# Patient Record
Sex: Female | Born: 1960
Health system: Southern US, Community
[De-identification: ages and names within clinical notes are randomized; demographics above are authoritative.]

## PROBLEM LIST (undated history)

## (undated) DIAGNOSIS — F32A Depression, unspecified: Secondary | ICD-10-CM

## (undated) DIAGNOSIS — J385 Laryngeal spasm: Secondary | ICD-10-CM

## (undated) DIAGNOSIS — E119 Type 2 diabetes mellitus without complications: Secondary | ICD-10-CM

## (undated) DIAGNOSIS — J383 Other diseases of vocal cords: Secondary | ICD-10-CM

## (undated) DIAGNOSIS — N1831 Chronic kidney disease, stage 3a: Secondary | ICD-10-CM

## (undated) DIAGNOSIS — N200 Calculus of kidney: Secondary | ICD-10-CM

## (undated) DIAGNOSIS — E042 Nontoxic multinodular goiter: Secondary | ICD-10-CM

## (undated) DIAGNOSIS — R42 Dizziness and giddiness: Secondary | ICD-10-CM

## (undated) DIAGNOSIS — I10 Essential (primary) hypertension: Secondary | ICD-10-CM

## (undated) DIAGNOSIS — J45909 Unspecified asthma, uncomplicated: Secondary | ICD-10-CM

## (undated) DIAGNOSIS — F419 Anxiety disorder, unspecified: Secondary | ICD-10-CM

## (undated) DIAGNOSIS — G4730 Sleep apnea, unspecified: Secondary | ICD-10-CM

## (undated) DIAGNOSIS — Z87442 Personal history of urinary calculi: Secondary | ICD-10-CM

## (undated) DIAGNOSIS — F329 Major depressive disorder, single episode, unspecified: Secondary | ICD-10-CM

## (undated) DIAGNOSIS — R490 Dysphonia: Secondary | ICD-10-CM

## (undated) HISTORY — DX: Dysphonia: R49.0

## (undated) HISTORY — DX: Calculus of kidney: N20.0

## (undated) HISTORY — DX: Laryngeal spasm: J38.5

## (undated) HISTORY — DX: Type 2 diabetes mellitus without complications: E11.9

## (undated) HISTORY — PX: LITHOTRIPSY: SUR834

## (undated) HISTORY — PX: CARDIAC CATHETERIZATION: SHX172

## (undated) HISTORY — DX: Chronic kidney disease, stage 3a: N18.31

## (undated) HISTORY — DX: Nontoxic multinodular goiter: E04.2

## (undated) HISTORY — DX: Essential (primary) hypertension: I10

## (undated) HISTORY — DX: Unspecified asthma, uncomplicated: J45.909

---
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
## 2018-01-22 ENCOUNTER — Encounter (INDEPENDENT_AMBULATORY_CARE_PROVIDER_SITE_OTHER): Payer: Self-pay

## 2018-01-22 ENCOUNTER — Ambulatory Visit (INDEPENDENT_AMBULATORY_CARE_PROVIDER_SITE_OTHER): Payer: Medicare Other | Admitting: Family Medicine

## 2018-01-22 ENCOUNTER — Ambulatory Visit (INDEPENDENT_AMBULATORY_CARE_PROVIDER_SITE_OTHER): Payer: Medicaid Other

## 2018-01-22 ENCOUNTER — Encounter: Payer: Self-pay | Admitting: Family Medicine

## 2018-01-22 VITALS — BP 126/82 | HR 95 | Temp 97.5°F | Resp 18 | Ht 68.5 in | Wt 241.4 lb

## 2018-01-22 DIAGNOSIS — N183 Chronic kidney disease, stage 3 (moderate): Secondary | ICD-10-CM | POA: Diagnosis not present

## 2018-01-22 DIAGNOSIS — E119 Type 2 diabetes mellitus without complications: Secondary | ICD-10-CM | POA: Insufficient documentation

## 2018-01-22 DIAGNOSIS — J4530 Mild persistent asthma, uncomplicated: Secondary | ICD-10-CM | POA: Diagnosis not present

## 2018-01-22 DIAGNOSIS — E1122 Type 2 diabetes mellitus with diabetic chronic kidney disease: Secondary | ICD-10-CM | POA: Diagnosis not present

## 2018-01-22 DIAGNOSIS — I10 Essential (primary) hypertension: Secondary | ICD-10-CM | POA: Insufficient documentation

## 2018-01-22 DIAGNOSIS — R0602 Shortness of breath: Secondary | ICD-10-CM | POA: Diagnosis not present

## 2018-01-22 DIAGNOSIS — E118 Type 2 diabetes mellitus with unspecified complications: Secondary | ICD-10-CM | POA: Insufficient documentation

## 2018-01-22 DIAGNOSIS — E782 Mixed hyperlipidemia: Secondary | ICD-10-CM | POA: Diagnosis not present

## 2018-01-22 LAB — COMPREHENSIVE METABOLIC PANEL
ALT: 9 U/L (ref 0–35)
AST: 11 U/L (ref 0–37)
Albumin: 4.2 g/dL (ref 3.5–5.2)
Alkaline Phosphatase: 78 U/L (ref 39–117)
BUN: 31 mg/dL — ABNORMAL HIGH (ref 6–23)
CO2: 28 mEq/L (ref 19–32)
Calcium: 9.8 mg/dL (ref 8.4–10.5)
Chloride: 101 mEq/L (ref 96–112)
Creatinine, Ser: 1.54 mg/dL — ABNORMAL HIGH (ref 0.40–1.20)
GFR: 34.57 mL/min — ABNORMAL LOW (ref >60.00)
Glucose, Bld: 184 mg/dL — ABNORMAL HIGH (ref 70–99)
Potassium: 5.2 mEq/L — ABNORMAL HIGH (ref 3.5–5.1)
Sodium: 138 mEq/L (ref 135–145)
Total Bilirubin: 0.6 mg/dL (ref 0.2–1.2)
Total Protein: 7.9 g/dL (ref 6.0–8.3)

## 2018-01-22 LAB — LIPID PANEL
Cholesterol: 185 mg/dL (ref 0–200)
HDL: 87 mg/dL (ref >39.00)
LDL Cholesterol: 80 mg/dL (ref 0–99)
NonHDL: 98.06
Total CHOL/HDL Ratio: 2
Triglycerides: 88 mg/dL (ref 0.0–149.0)
VLDL: 17.6 mg/dL (ref 0.0–40.0)

## 2018-01-22 LAB — CBC
HCT: 37.5 % (ref 36.0–46.0)
Hemoglobin: 12 g/dL (ref 12.0–15.0)
MCHC: 31.9 g/dL (ref 30.0–36.0)
MCV: 71.3 fl — ABNORMAL LOW (ref 78.0–100.0)
Platelets: 352 10*3/uL (ref 150.0–400.0)
RBC: 5.26 Mil/uL — ABNORMAL HIGH (ref 3.87–5.11)
RDW: 17.2 % — ABNORMAL HIGH (ref 11.5–15.5)
WBC: 6.5 10*3/uL (ref 4.0–10.5)

## 2018-01-22 LAB — B12 AND FOLATE PANEL
Folate: 14.5 ng/mL (ref >5.9)
Vitamin B-12: 271 pg/mL (ref 211–911)

## 2018-01-22 LAB — HEMOGLOBIN A1C: Hgb A1c MFr Bld: 7.7 % — ABNORMAL HIGH (ref 4.6–6.5)

## 2018-01-22 LAB — VITAMIN D 25 HYDROXY (VIT D DEFICIENCY, FRACTURES): VITD: 23.13 ng/mL — ABNORMAL LOW (ref 30.00–100.00)

## 2018-01-22 MED ORDER — ALBUTEROL SULFATE HFA 108 (90 BASE) MCG/ACT IN AERS: 2.0000 | INHALATION_SPRAY | Freq: Four times a day (QID) | RESPIRATORY_TRACT | 2 refills | Status: DC | PRN

## 2018-01-22 MED ORDER — SPIRONOLACTONE 50 MG PO TABS: 50.0000 mg | ORAL_TABLET | Freq: Every day | ORAL | 3 refills | Status: DC

## 2018-01-22 MED ORDER — LOVASTATIN 20 MG PO TABS: 20.0000 mg | ORAL_TABLET | Freq: Every day | ORAL | 3 refills | Status: DC

## 2018-01-22 MED ORDER — DULAGLUTIDE 1.5 MG/0.5ML ~~LOC~~ SOAJ: 1.5000 mg | SUBCUTANEOUS | 5 refills | Status: DC

## 2018-01-22 MED ORDER — FLUTICASONE FUROATE-VILANTEROL 200-25 MCG/INH IN AEPB: 1.0000 | INHALATION_SPRAY | Freq: Every day | RESPIRATORY_TRACT | 2 refills | Status: DC

## 2018-01-22 MED ORDER — AMLODIPINE BESYLATE 10 MG PO TABS: 10.0000 mg | ORAL_TABLET | Freq: Every day | ORAL | 3 refills | Status: DC

## 2018-01-22 MED ORDER — CLONIDINE HCL 0.1 MG PO TABS: 0.1000 mg | ORAL_TABLET | Freq: Two times a day (BID) | ORAL | 3 refills | Status: DC

## 2018-01-22 MED ORDER — CARVEDILOL 25 MG PO TABS: 25.0000 mg | ORAL_TABLET | Freq: Two times a day (BID) | ORAL | 3 refills | Status: DC

## 2018-01-22 NOTE — Progress Notes (Signed)
Subjective:    Patient ID: Nancy Barajas, female    DOB: April 02, 1960, 58 y.o.   MRN: [id number]  HPI   Patient presents to clinic to establish with PCP.  Patient does require medication refills for her multiple medical conditions.  Patient has a history of hypertension, she uses amlodipine, clonidine, carvedilol and spironolactone for blood pressure control.  With this combination her BPs have been controlled.  Denies any history of heart attack.  Does not believe she is ever seen cardiology.  Patient also takes lovastatin for elevated cholesterol.  She tolerates the statin without any adverse effects, has no body aches or abdominal pain.  Patient is a type II diabetic.  She currently is taking Trulicity 1.5 mg/week.  Her blood sugars have been very well controlled on this, she checks a fasting sugar every morning and her numbers usually range from 106-160.  Denies any highs or severe lows.  Patient also has been struggling with issues regarding shortness of breath since 2016.  Patient states she will lose her breath while talking making her voice crack.  Patient states she has had imaging of her chest, last chest x-ray was over 2 years ago.  Patient also states that she has seen ENT in regards to this, they did a scope down her throat and upper nose and did not find any abnormality.  Patient is unsure if she is ever seen pulmonology.  Patient states she was told by multiple urgent cares that she has asthma & currently she uses albuterol as needed, not sure if it is helping much.  Past Medical History:  Diagnosis Date  . Asthma   . Diabetes mellitus without complication ([place])   . Hypertension    Patient Active Problem List   Diagnosis Date Noted  . Shortness of breath 01/22/2018  . Essential hypertension 01/22/2018  . Type 2 diabetes mellitus without complication, without long-term current use of insulin ([place]) 01/22/2018  . Mixed hyperlipidemia 01/22/2018  . Mild persistent  asthma 01/22/2018   Social History   Tobacco Use  . Smoking status: Former Research scientist (life sciences)  . Smokeless tobacco: Never Used  Substance Use Topics  . Alcohol use: Never    Frequency: Never   History reviewed. No pertinent surgical history.  Family History  Problem Relation Age of Onset  . Hypertension Mother   . Diabetes Mother   . Hypertension Father   . Diabetes Father    Review of Systems  Constitutional: Negative for chills, fatigue and fever.  HENT: Negative for congestion, ear pain, sinus pain and sore throat.   Eyes: Negative.   Respiratory: Negative for cough. +SOB and wheezing.  Cardiovascular: Negative for chest pain, palpitations and leg swelling.  Gastrointestinal: Negative for abdominal pain, diarrhea, nausea and vomiting.  Genitourinary: Negative for dysuria, frequency and urgency.  Musculoskeletal: Negative for arthralgias and myalgias.  Skin: Negative for color change, pallor and rash.  Neurological: Negative for syncope, light-headedness and headaches.  Psychiatric/Behavioral: The patient is not nervous/anxious.       Objective:   Physical Exam Vitals signs and nursing note reviewed.  Constitutional:      General: She is not in acute distress.    Appearance: Normal appearance. She is not ill-appearing or toxic-appearing.  HENT:     Head: Normocephalic and atraumatic.     Mouth/Throat:     Mouth: Mucous membranes are moist.  Eyes:     General: No scleral icterus.    Extraocular Movements: Extraocular movements intact.  Conjunctiva/sclera: Conjunctivae normal.  Neck:     Musculoskeletal: Normal range of motion and neck supple. No neck rigidity.  Cardiovascular:     Rate and Rhythm: Normal rate and regular rhythm.  Pulmonary:     Effort: Pulmonary effort is normal.     Breath sounds: No wheezing, rhonchi or rales.     Comments: Patient will have episodes where she will lose her breath while talking, have to stop or cough and then can talk again.    Musculoskeletal:     Right lower leg: No edema.     Left lower leg: No edema.  Lymphadenopathy:     Cervical: No cervical adenopathy.  Neurological:     Mental Status: She is alert and oriented to person, place, and time.     Gait: Gait normal.  Psychiatric:        Mood and Affect: Mood normal.        Behavior: Behavior normal.    Vitals:   01/22/18 0933  BP: 126/82  Pulse: 95  Resp: 18  Temp: (!) 97.5 F (36.4 C)  SpO2: 98%      Assessment & Plan:   Shortness of breath/asthma - patient has had seemingly an extensive work-up in regard to her shortness of breath without any answer as to what is happening.  Patient will continue to use albuterol as needed.  We will trial adding once daily Breo to see if this makes any effect.  We will get new chest x-ray imaging and lab today and also I will place pulmonology referral for further evaluation into causes of her breathlessness at times while talking.  Essential hypertension-blood pressure stable on current regimen.  Refills of medicine given.  Patient most likely will need a cardiology referral, states she would like to hold off at this time.  Type 2 diabetes-patient's sugar log is very good.  She will continue Trulicity once per week.  We will get new A1c today.  Hyperlipidemia-patient will continue her statin.  Tolerating this not an issue.  Blood work drawn in clinic today.  Patient will follow-up here in approximately 2 weeks for recheck on breathing and also to get Pap smear/CPE

## 2018-01-23 LAB — THYROID PANEL WITH TSH
Free Thyroxine Index: 2.8 (ref 1.4–3.8)
T3 Uptake: 34 % (ref 22–35)
T4, Total: 8.3 ug/dL (ref 5.1–11.9)
TSH: 1.05 mIU/L (ref 0.40–4.50)

## 2018-01-25 NOTE — Addendum Note (Signed)
Addended by: Philis Nettle on: 01/25/2018 01:25 PM   Modules accepted: Orders

## 2018-01-26 ENCOUNTER — Telehealth: Payer: Self-pay | Admitting: Lab

## 2018-01-26 ENCOUNTER — Telehealth: Payer: Self-pay | Admitting: Family Medicine

## 2018-01-26 DIAGNOSIS — E559 Vitamin D deficiency, unspecified: Secondary | ICD-10-CM

## 2018-01-26 MED ORDER — ERGOCALCIFEROL 50 MCG (2000 UT) PO CAPS: 1.0000 | ORAL_CAPSULE | Freq: Every day | ORAL | 1 refills | Status: DC

## 2018-01-26 NOTE — Telephone Encounter (Signed)
Copied from [place] [id number]. Topic: Quick Communication - See Telephone Encounter >> Jan 26, 2018  9:30 AM Ahmed Prima L wrote: CRM for notification. See Telephone encounter for: 01/26/18.  Pt was given her lab results yesterday and was advised by the nurse that Vitamin D would be called in for her. Patient checked with [facility] and they did not have anything for her. Please Calcium

## 2018-01-26 NOTE — Telephone Encounter (Signed)
Barajas called Nancy Barajas was given her lab results yesterday and was advised by the nurse that Vitamin D would be called in for her. Patient checked with [facility] and they did not have anything for her. Please Fisher

## 2018-01-26 NOTE — Telephone Encounter (Signed)
Called Pt and told her that her Rx was sent to the pharmacy

## 2018-01-31 ENCOUNTER — Ambulatory Visit (INDEPENDENT_AMBULATORY_CARE_PROVIDER_SITE_OTHER): Payer: Medicare Other | Admitting: Pulmonary Disease

## 2018-01-31 ENCOUNTER — Encounter: Payer: Self-pay | Admitting: Pulmonary Disease

## 2018-01-31 VITALS — BP 138/88 | HR 105 | Resp 16 | Ht 68.5 in | Wt 240.0 lb

## 2018-01-31 DIAGNOSIS — R0609 Other forms of dyspnea: Secondary | ICD-10-CM

## 2018-01-31 DIAGNOSIS — R0600 Dyspnea, unspecified: Secondary | ICD-10-CM

## 2018-01-31 DIAGNOSIS — R0602 Shortness of breath: Secondary | ICD-10-CM | POA: Diagnosis not present

## 2018-01-31 DIAGNOSIS — R490 Dysphonia: Secondary | ICD-10-CM

## 2018-01-31 NOTE — Progress Notes (Signed)
Subjective:    Patient ID: Nancy Barajas, female    DOB: 1960/09/08, 58 y.o.   MRN: [id number]  HPI Nancy Barajas is a 58 year old remote former smoker who presents for evaluation management of potential poorly controlled asthma.  She is kindly referred by Nancy Nettle, FNP.  The patient states that she has had issues with breathing and talking at the same time for approximately 4 to 5 years.  She gets very breathless and feels she cannot get air in.  Also notices "wheezing" which she states is around the neck area.  She states that symptoms are made worse by dust and "poor air" also aggravated by stress.  She does not know if anything makes it better.  She has tried various inhalers to include albuterol and Breo Ellipta without impact.  She previously resided in [place] and was being evaluated at the [facility] disorders center for this issue.  Previously she has had extensive evaluation to include 2D echo and multiple CT scans.  In July 2016 she was noted to have ejection fraction 45% on 2D echo as well as stage II diastolic dysfunction.  She has had previous CT scans of the chest that have shown no significant pathology except for an enlarged thyroid gland.  The patient is not aware that this is been worked up.  As noted the patient has been evaluated previously by the voice disorders center at [facility].  She states that therapy through this clinic had variable degrees of success for her.  Patient does not describe any cough, chest pain, sputum production, or hemoptysis.  She has had no fevers, chills or sweats.  She does describe orthopnea has had paroxysmal nocturnal dyspnea, however, not recently.  Does describe the inability to talk and breathe at the same time and notes that it is sometimes hard to take a breath in or let the breath out as if her her airway closes off.  She does not describe any gastroesophageal reflux symptoms and states that in the past she has had  treatment with PPIs that were also ineffective.  She has been under incredible stress due to the fact that she had been homeless for a while.  Currently she has been in public housing for the last few months.  Her situation has become more stable.   Past medical history, surgical history and family history have been reviewed.  She is a remote former smoker having smoked 1 pack of cigarettes per day for "short period time" she quit 15 years ago.  She is currently unemployed and has not had any environmental exposures and prior work that she can recall.  She has no pets in the home.  She has no unusual hobbies.  She has never been in the [employer].  She has only lived in [place] and has not had any travel outside of the country or out [place].  No history of tuberculosis.  She has been tested for tuberculosis and has been negative.    Review of Systems  Constitutional: Positive for fatigue.  HENT: Positive for voice change.   Eyes: Negative.   Respiratory: Positive for choking, shortness of breath and wheezing.   Cardiovascular: Positive for palpitations (Tachypalpitations).  Gastrointestinal: Negative.   Endocrine: Negative.   Genitourinary: Negative.   Musculoskeletal: Negative.   Skin: Negative.   Allergic/Immunologic: Negative.   Neurological: Positive for speech difficulty.  Hematological: Negative.   Psychiatric/Behavioral: The patient is nervous/anxious.   All other systems reviewed and are  negative.      Objective:   Physical Exam Vitals signs and nursing note reviewed.  Constitutional:      Appearance: Normal appearance. She is obese.  HENT:     Head: Normocephalic and atraumatic.     Right Ear: External ear normal.     Left Ear: External ear normal.     Nose: Nose normal.     Mouth/Throat:     Mouth: Mucous membranes are moist.     Pharynx: Oropharynx is clear.  Eyes:     General: No scleral icterus.    Extraocular Movements: Extraocular movements intact.      Conjunctiva/sclera: Conjunctivae normal.     Pupils: Pupils are equal, round, and reactive to light.  Neck:     Musculoskeletal: Normal range of motion and neck supple. No neck rigidity.     Thyroid: Thyromegaly present.     Trachea: No tracheal deviation.     Comments: Phonation is abnormal.  Has significant pseudo-wheeze.  Soft anterior tracheal membrane neck membrane with paradoxical motion on inspiration. Cardiovascular:     Rate and Rhythm: Regular rhythm. Tachycardia present.     Pulses: Normal pulses.     Heart sounds: Normal heart sounds.  Pulmonary:     Effort: Pulmonary effort is normal.     Breath sounds: Normal breath sounds.     Comments: She has upper airway pseudo-wheeze. Abdominal:     General: Abdomen is flat. There is no distension.     Palpations: Abdomen is soft.  Musculoskeletal: Normal range of motion.     Right lower leg: No edema.     Left lower leg: No edema.  Skin:    General: Skin is warm and dry.  Neurological:     General: No focal deficit present.     Mental Status: She is alert and oriented to person, place, and time.  Psychiatric:        Attention and Perception: Attention normal.        Mood and Affect: Mood is anxious.        Thought Content: Thought content normal.    Spirometry was performed today.  It does not show obstructive defect.  Flow volume loops showed expiratory limb flattening and 1 of the attempts showed a "double hump" expiratory loop pattern.  Inspiratory loops were not reliably obtained.     Assessment & Plan:    1.  Dyspnea and fatigue: The patient has issues that are related to upper airway pathology.  She does not show obstructive defect on spirometry.  Nevertheless I would like to obtain a better flow volume loop and therefore formal pulmonary function testing will be obtained.  The pattern that she exhibited on expiratory limb could represent dynamic non-fixed intrathoracic obstruction as seen in tracheomalacia or thyroid  goiter.  She has been noted to have an enlarged thyroid on prior CTs.  This has not been followed up.  In addition there was 1 of the flow-volume loops that showed a double hump pattern a similar double hump pattern in the contour of the expiratory curve has been described in patients with vocal cord dysfunction characterized by initial adduction and subsequent abduction of the vocal cords in mid-expiration.  This is better evaluated by a voice disorders center.  We will also try to obtain a dynamic CT of the chest to evaluate for 2 possibilities one would be the thyroid goiter extending into the mediastinum and the second to see if there is any collapse  of the trachea during inspiration.  As the patient has already noted inhalers will have no impact on this.  As a form of therapy I have recommended she get a harmonica as this will help with inspiratory and expiratory exercises.  2.  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  She will need again, referral to a voice disorders center.  Sometimes these patients can have findings of prior small ischemic events to basal ganglia.  The patient has had prior MRI and CTs performed at [facility] but these have not shown particular lesion to the basal ganglia.  She does have chronic small vessel ischemic disease noted.  This may also be playing an impact on her issues.  3.  Diastolic dysfunction of the left ventricle: This issue adds complexity to her management.  4.  Anxiety: This issue also adds complexity to her management as it will aggravate her issues with dysphonia.  She was noted to have tachycardia today and she specifically stated that it was due to being anxious.  She may benefit from low-dose anxiolytics.  We will reevaluate the patient after CT scan and PFTs are done.  She has been referred to the [facility] voice disorders clinic.  Thank you for allowing me to see  dissipate in this patient's care.

## 2018-01-31 NOTE — Progress Notes (Signed)
spi

## 2018-01-31 NOTE — Patient Instructions (Signed)
1.  We are going to order breathing test.  2.  We will also get a get a CT scan of the chest.  3.  You will be referred to the voice disorders center (speech pathology) at [facility].  4.  Recommend you get a harmonica to exercise breathing and breathing out.  It does not have to be expensive.

## 2018-02-05 ENCOUNTER — Ambulatory Visit (INDEPENDENT_AMBULATORY_CARE_PROVIDER_SITE_OTHER): Payer: Medicare Other | Admitting: Family Medicine

## 2018-02-05 ENCOUNTER — Other Ambulatory Visit (HOSPITAL_COMMUNITY)
Admission: RE | Admit: 2018-02-05 | Discharge: 2018-02-05 | Disposition: A | Discharge status: Home / Self Care | Payer: Medicare Other | Source: Ambulatory Visit | Attending: Family Medicine | Admitting: Family Medicine

## 2018-02-05 VITALS — BP 140/88 | HR 83 | Temp 98.3°F | Resp 18 | Ht 68.0 in | Wt 246.2 lb

## 2018-02-05 DIAGNOSIS — Z124 Encounter for screening for malignant neoplasm of cervix: Secondary | ICD-10-CM

## 2018-02-05 DIAGNOSIS — Z0001 Encounter for general adult medical examination with abnormal findings: Secondary | ICD-10-CM

## 2018-02-05 DIAGNOSIS — N289 Disorder of kidney and ureter, unspecified: Secondary | ICD-10-CM

## 2018-02-05 DIAGNOSIS — I10 Essential (primary) hypertension: Secondary | ICD-10-CM | POA: Diagnosis not present

## 2018-02-05 DIAGNOSIS — E010 Iodine-deficiency related diffuse (endemic) goiter: Secondary | ICD-10-CM | POA: Diagnosis not present

## 2018-02-05 DIAGNOSIS — Z114 Encounter for screening for human immunodeficiency virus [HIV]: Secondary | ICD-10-CM

## 2018-02-05 DIAGNOSIS — R490 Dysphonia: Secondary | ICD-10-CM

## 2018-02-05 DIAGNOSIS — Z1231 Encounter for screening mammogram for malignant neoplasm of breast: Secondary | ICD-10-CM | POA: Diagnosis not present

## 2018-02-05 DIAGNOSIS — R0602 Shortness of breath: Secondary | ICD-10-CM | POA: Diagnosis not present

## 2018-02-05 DIAGNOSIS — Z1159 Encounter for screening for other viral diseases: Secondary | ICD-10-CM | POA: Diagnosis not present

## 2018-02-05 DIAGNOSIS — E119 Type 2 diabetes mellitus without complications: Secondary | ICD-10-CM | POA: Diagnosis not present

## 2018-02-05 DIAGNOSIS — Z0000 Encounter for general adult medical examination without abnormal findings: Secondary | ICD-10-CM

## 2018-02-05 DIAGNOSIS — Z1211 Encounter for screening for malignant neoplasm of colon: Secondary | ICD-10-CM | POA: Diagnosis not present

## 2018-02-05 NOTE — Patient Instructions (Signed)
Stop sprionolactone  F/u in 2 weeks for BP check with nurse after stopping medication

## 2018-02-05 NOTE — Progress Notes (Signed)
Subjective:    Patient ID: Nancy Barajas, female    DOB: [birth year], 59 y.o.   MRN: [id number]  HPI  Patient presents to clinic for complete physical exam and Pap smear.  Patient has never had colonoscopy.  States she would be agreeable to Cologuard.  Patient believes her last mammogram was in January 2019, we will order her mammogram so she can get 01/06/2018.  As far as patient knows, has never had abnormal Pap smear.  Also reviewed patient's lab work with her.  A1c appears stable at 7.7%, she will continue Trulicity.  Kidney function did appear to increase.  We will plan to do a nephrology referral and have patient stop her Aldactone will continue to monitor her blood pressure.  Patient was able to see pulmonology to follow-up on her shortness of breath/voice issues.  Pulmonology does not think shortness of breath is related to obstructive or inspiration issue.  Suspect is possibly related to something in the throat.  Patient was noted to have enlarged thyroid on past CT in past; CT of chest is planned by pulmonology to look further into causes of patient's shortness of breath symptom.   Patient Active Problem List   Diagnosis Date Noted  . Shortness of breath 01/22/2018  . Essential hypertension 01/22/2018  . Type 2 diabetes mellitus without complication, without long-term current use of insulin ([place]) 01/22/2018  . Mixed hyperlipidemia 01/22/2018  . Mild persistent asthma 01/22/2018   Social History   Tobacco Use  . Smoking status: Former Smoker    Packs/day: 1.00    Years: 10.00    Pack years: 10.00  . Smokeless tobacco: Never Used  Substance Use Topics  . Alcohol use: Never    Frequency: Never    Family History  Problem Relation Age of Onset  . Hypertension Mother   . Diabetes Mother   . Hypertension Father   . Diabetes Father    Review of Systems   Constitutional: Negative for chills, fatigue and fever.  HENT: Negative for congestion, ear pain, sinus  pain and sore throat.  +voice scratchy, comes and goes.  Eyes: Negative.   Respiratory: Negative for cough. +SOB, wheezing   Cardiovascular: Negative for chest pain, palpitations and leg swelling.  Gastrointestinal: Negative for abdominal pain, diarrhea, nausea and vomiting.  Genitourinary: Negative for dysuria, frequency and urgency.  Musculoskeletal: Negative for arthralgias and myalgias.  Skin: Negative for color change, pallor and rash.  Neurological: Negative for syncope, light-headedness and headaches.  Psychiatric/Behavioral: The patient is not nervous/anxious.       Objective:   Physical Exam Vitals signs and nursing note reviewed.  Constitutional:      General: She is not in acute distress.    Appearance: She is not toxic-appearing.  HENT:     Head: Normocephalic and atraumatic.     Right Ear: Tympanic membrane, ear canal and external ear normal. There is no impacted cerumen.     Left Ear: Tympanic membrane, ear canal and external ear normal. There is no impacted cerumen.     Nose: Nose normal.     Mouth/Throat:     Mouth: Mucous membranes are moist.  Eyes:     General: No scleral icterus.    Extraocular Movements: Extraocular movements intact.     Conjunctiva/sclera: Conjunctivae normal.  Neck:     Musculoskeletal: Neck supple. No neck rigidity or muscular tenderness.     Thyroid: Thyromegaly present.     Trachea: Trachea normal.  Cardiovascular:  Rate and Rhythm: Normal rate and regular rhythm.     Pulses:          Dorsalis pedis pulses are 2+ on the right side and 2+ on the left side.       Posterior tibial pulses are 2+ on the right side and 2+ on the left side.  Pulmonary:     Effort: Pulmonary effort is normal.     Breath sounds: Normal breath sounds. No wheezing, rhonchi or rales.     Comments: Appears breathless when she speaks.  Sometimes will have to catch her breath and swallow when she loses her voice to start speaking again. Oxygen levels are normal.  Respiratory rate normal.  Musculoskeletal:     Right foot: Normal range of motion. No deformity or foot drop.     Left foot: Normal range of motion. No deformity or foot drop.  Feet:     Right foot:     Protective Sensation: 4 sites tested. 4 sites sensed.     Skin integrity: Skin integrity normal.     Left foot:     Protective Sensation: 4 sites tested. 4 sites sensed.     Skin integrity: Skin integrity normal.  Lymphadenopathy:     Cervical: No cervical adenopathy.  Skin:    General: Skin is warm and dry.     Coloration: Skin is not pale.  Neurological:     Mental Status: She is alert and oriented to person, place, and time.  Psychiatric:        Mood and Affect: Mood normal.        Behavior: Behavior normal.    Vitals:   02/05/18 0927  BP: 140/88  Pulse: 83  Resp: 18  Temp: 98.3 F (36.8 C)  SpO2: 99%      Assessment & Plan:   Well adult exam, Pap smear, colon cancer screening, mammogram- Pap smear collected and sent to lab.  Patient would prefer not to do colonoscopy, but is agreeable to Cologuard, Cologuard order placed. Mammogram order placed for patient, aware she has to call and set up mammogram appointment, given phone numbers to do this.  Discussed healthy diet and regular exercise, advised diet high in lean proteins and vegetables lower in processed sugars and carbohydrates.  Also recommended regular physical activity at least 3-5 nights per week of 30 minutes per session.  Discussed safe sun practices including wearing SPF of at least 30 when outdoors for extended period of time.  Patient does wear seatbelt whenever in car.  Kidney function decreased --suspect this is related to long-term hypertension and diabetes.  We will stop parental lactone due to this medication could be affecting kidney function.  Nephrology referral placed.  Type 2 diabetes-A1c is at 7.7%, overall decent blood sugar control.  Patient will continue Trulicity 1.5 mg/week.  Essential  hypertension- BP decently controlled at today's visit.  We will monitor blood pressures with stopping of spironolactone.  She will follow-up here in 2 weeks for nurse visit to check blood pressure.  Dysphonia/thyromegaly/shortness of breath- shortness of breath does not seem to be related to a pulmonary issue.  A CT scan is planned via pulmonology.  Patient does have upcoming pulmonology follow-up planned as well.  Due to thyromegaly we will get ultrasound of thyroid to further investigate.  Also I have placed ENT referral due to suspected issue in throat that could be causing patient's voice issues.  Patient aware she will be contacted in regards to referral appointments.  She will schedule a 62-month follow-up here for recheck of chronic conditions.  Advised that she can return to clinic sooner if any issues arise.

## 2018-02-06 LAB — CYTOLOGY - PAP
Diagnosis: NEGATIVE
HPV: NOT DETECTED

## 2018-02-07 ENCOUNTER — Ambulatory Visit
Admission: RE | Admit: 2018-02-07 | Discharge: 2018-02-07 | Disposition: A | Discharge status: Home / Self Care | Payer: Medicare Other | Source: Ambulatory Visit | Attending: Pulmonary Disease | Admitting: Pulmonary Disease

## 2018-02-07 DIAGNOSIS — R0600 Dyspnea, unspecified: Secondary | ICD-10-CM | POA: Diagnosis not present

## 2018-02-07 DIAGNOSIS — K449 Diaphragmatic hernia without obstruction or gangrene: Secondary | ICD-10-CM | POA: Diagnosis not present

## 2018-02-07 LAB — POCT I-STAT CREATININE: Creatinine, Ser: 1.3 mg/dL — ABNORMAL HIGH (ref 0.44–1.00)

## 2018-02-07 MED ORDER — IOPAMIDOL (ISOVUE-300) INJECTION 61%
75.0000 mL | Freq: Once | INTRAVENOUS | Status: AC | PRN
  Administered 2018-02-07: 75 mL via INTRAVENOUS

## 2018-02-15 ENCOUNTER — Ambulatory Visit: Payer: Medicare Other | Attending: Pulmonary Disease

## 2018-02-15 DIAGNOSIS — R0600 Dyspnea, unspecified: Secondary | ICD-10-CM | POA: Diagnosis not present

## 2018-02-15 DIAGNOSIS — R490 Dysphonia: Secondary | ICD-10-CM | POA: Diagnosis not present

## 2018-02-15 MED ORDER — ALBUTEROL SULFATE (2.5 MG/3ML) 0.083% IN NEBU
2.5000 mg | INHALATION_SOLUTION | Freq: Once | RESPIRATORY_TRACT | Status: AC
  Administered 2018-02-15: 2.5 mg via RESPIRATORY_TRACT
  Filled 2018-02-15: qty 3

## 2018-02-21 ENCOUNTER — Ambulatory Visit: Payer: Medicare Other | Admitting: Pulmonary Disease

## 2018-02-21 ENCOUNTER — Telehealth: Payer: Self-pay

## 2018-02-21 ENCOUNTER — Ambulatory Visit: Payer: Medicare Other | Admitting: *Deleted

## 2018-02-21 VITALS — BP 158/100 | HR 87 | Resp 20

## 2018-02-21 DIAGNOSIS — I10 Essential (primary) hypertension: Secondary | ICD-10-CM

## 2018-02-21 NOTE — Progress Notes (Signed)
Patient here for nurse visit BP check per order from 02/05/18   Patient reports compliance with prescribed BP medications: yes, but patient was in a hurry today and has not taken BP medications  Last dose of BP medication: 02/20/18 8-9 am   BP Readings from Last 3 Encounters:  02/21/18 (!) 158/100  02/05/18 140/88  01/31/18 138/88   Pulse Readings from Last 3 Encounters:  02/21/18 87  02/05/18 83  01/31/18 (!) 105    Per Philis Nettle NP conferred as to patient BP was advised to have patient return home take morning BP medications and call back with BP reading after lunch.  Patient verbalized understanding of instructions.   Kerin Salen, LPN

## 2018-02-21 NOTE — Telephone Encounter (Signed)
Called patient back to give instructions from Philis Nettle, NP regarding blood pressure monitoring.  Pt did not answer.  Left vm for patient to return call.

## 2018-02-21 NOTE — Telephone Encounter (Signed)
Copied from [place] [id number]. Topic: General - Inquiry >> Feb 21, 2018  1:22 PM Vernona Rieger wrote: Reason for CRM: [name], patient called back to give your her blood pressure reading from 12:30 after meds. It has went down , it was 133/95

## 2018-02-21 NOTE — Telephone Encounter (Signed)
Patient returned call.  Spoke with patient and gave patient instructions per Philis Nettle, NP regarding medications and bp monitoring at home.  Paient understands that she will need to call the office if her bp consistently stays in the 140s/90s.

## 2018-02-21 NOTE — Progress Notes (Signed)
Reviewed-LG

## 2018-02-21 NOTE — Telephone Encounter (Signed)
Wonderful, thank you  Continue all other BP medications and remain off of the Spironolactone.   Keep appt here as planned in May 2020. Monitor BP at home and if BP begins to stay 140s/90s consistently, call and let us know

## 2018-02-21 NOTE — Telephone Encounter (Signed)
Pt called back, no answer at [facility], [place] line.

## 2018-02-22 ENCOUNTER — Ambulatory Visit: Payer: Medicare Other | Admitting: Pulmonary Disease

## 2018-02-27 ENCOUNTER — Ambulatory Visit: Payer: Medicare Other

## 2018-03-09 DIAGNOSIS — J393 Upper respiratory tract hypersensitivity reaction, site unspecified: Secondary | ICD-10-CM | POA: Diagnosis not present

## 2018-03-09 DIAGNOSIS — J301 Allergic rhinitis due to pollen: Secondary | ICD-10-CM | POA: Diagnosis not present

## 2018-03-12 ENCOUNTER — Ambulatory Visit: Payer: Medicare Other | Attending: Family Medicine

## 2018-03-12 ENCOUNTER — Telehealth: Payer: Self-pay

## 2018-03-12 NOTE — Telephone Encounter (Signed)
OK noted.

## 2018-03-12 NOTE — Telephone Encounter (Signed)
Reason for CRM: Nancy Barajas is calling this patient did not show up for her thyroid ultrasound

## 2018-03-12 NOTE — Telephone Encounter (Signed)
Copied from [place] [id number] [phone]. Topic: General - Other >> Mar 12, 2018 11:34 AM Lennox Solders wrote: Reason for CRM: [facility] is calling this patient did not show up for her thyroid ultrasound

## 2018-03-19 ENCOUNTER — Encounter: Payer: Self-pay | Admitting: Family Medicine

## 2018-03-19 ENCOUNTER — Other Ambulatory Visit: Payer: Self-pay | Admitting: Nephrology

## 2018-03-19 DIAGNOSIS — N183 Chronic kidney disease, stage 3 unspecified: Secondary | ICD-10-CM

## 2018-03-19 DIAGNOSIS — R319 Hematuria, unspecified: Secondary | ICD-10-CM

## 2018-03-19 DIAGNOSIS — I129 Hypertensive chronic kidney disease with stage 1 through stage 4 chronic kidney disease, or unspecified chronic kidney disease: Secondary | ICD-10-CM | POA: Diagnosis not present

## 2018-03-19 DIAGNOSIS — E1122 Type 2 diabetes mellitus with diabetic chronic kidney disease: Secondary | ICD-10-CM | POA: Diagnosis not present

## 2018-03-19 DIAGNOSIS — R809 Proteinuria, unspecified: Secondary | ICD-10-CM | POA: Diagnosis not present

## 2018-03-20 DIAGNOSIS — R600 Localized edema: Secondary | ICD-10-CM | POA: Diagnosis not present

## 2018-03-20 DIAGNOSIS — R490 Dysphonia: Secondary | ICD-10-CM | POA: Diagnosis not present

## 2018-03-20 DIAGNOSIS — Z87891 Personal history of nicotine dependence: Secondary | ICD-10-CM | POA: Diagnosis not present

## 2018-03-20 DIAGNOSIS — J385 Laryngeal spasm: Secondary | ICD-10-CM | POA: Diagnosis not present

## 2018-03-20 DIAGNOSIS — J384 Edema of larynx: Secondary | ICD-10-CM | POA: Diagnosis not present

## 2018-03-27 ENCOUNTER — Ambulatory Visit: Payer: Medicare Other

## 2018-04-30 ENCOUNTER — Ambulatory Visit: Payer: Medicare Other | Attending: Nephrology

## 2018-05-03 ENCOUNTER — Telehealth: Payer: Self-pay | Admitting: Family Medicine

## 2018-05-03 NOTE — Telephone Encounter (Signed)
Pt. Called the COVID 19 question line to ask how the Virtual appt. Works, as she is scheduled for one on Monday, 5/4.  Phone call ret'd to pt.  She stated she just received call, and was advised she would be receiving a text from the office.  Stated she understood.  No further questions at this time.  

## 2018-05-07 ENCOUNTER — Ambulatory Visit (INDEPENDENT_AMBULATORY_CARE_PROVIDER_SITE_OTHER): Payer: Medicare Other | Admitting: Family Medicine

## 2018-05-07 ENCOUNTER — Encounter: Payer: Self-pay | Admitting: Family Medicine

## 2018-05-07 ENCOUNTER — Telehealth: Payer: Self-pay | Admitting: Family Medicine

## 2018-05-07 ENCOUNTER — Other Ambulatory Visit: Payer: Self-pay

## 2018-05-07 VITALS — BP 165/109 | HR 78 | Temp 98.4°F | Resp 18

## 2018-05-07 DIAGNOSIS — R490 Dysphonia: Secondary | ICD-10-CM | POA: Diagnosis not present

## 2018-05-07 DIAGNOSIS — I10 Essential (primary) hypertension: Secondary | ICD-10-CM | POA: Diagnosis not present

## 2018-05-07 DIAGNOSIS — J385 Laryngeal spasm: Secondary | ICD-10-CM | POA: Diagnosis not present

## 2018-05-07 DIAGNOSIS — E119 Type 2 diabetes mellitus without complications: Secondary | ICD-10-CM | POA: Diagnosis not present

## 2018-05-07 MED ORDER — DULAGLUTIDE 1.5 MG/0.5ML ~~LOC~~ SOAJ: 1.5000 mg | SUBCUTANEOUS | 5 refills | Status: DC

## 2018-05-07 NOTE — Progress Notes (Signed)
Patient ID: Nancy Barajas, female   DOB: 03/02/1960, 58 y.o.   MRN: [id number]  Virtual Visit via video Note  This visit type was conducted due to national recommendations for restrictions regarding the COVID-19 pandemic (e.g. social distancing).  This format is felt to be most appropriate for this patient at this time.  All issues noted in this document were discussed and addressed.  No physical exam was performed (except for noted visual exam findings with Video Visits).   I connected with [name] on 05/07/18 at  8:00 AM EDT by a video enabled telemedicine application and verified that I am speaking with the correct person using two identifiers. Location patient: home Location provider: [facility] Persons participating in the virtual visit: patient, provider  I discussed the limitations, risks, security and privacy concerns of performing an evaluation and management service by video and the availability of in person appointments. I also discussed with the patient that there may be a patient responsible charge related to this service. The patient expressed understanding and agreed to proceed.   HPI:  Patient and I connected via video today for follow-up diabetes, blood pressure and feelings of shortness of breath/voice changes.  Patient's blood sugars have been stable.  Charted this morning and is 149.  Takes Trulicity once per week without any problems.  Really enjoys this medication and that is dosed only once per week, states this is easy for her lifestyle.  Blood pressure this a.m. was slightly high 165/109, but states she has not yet taken her BP medications.  States she usually takes them around 9 AM and she will check her blood pressure later on today.  When she checks her BP in the evenings, it usually runs in the 130s to 140s over 80s.  Denies chest pain, shortness breath or wheezing.  Denies palpitations.  Denies lower extremity swelling.  Patient has been having  issues with losing her breath and feelings of breathlessness off and on for many months.  We referred patient to pulmonology and no obvious issues were seen with her lungs.  She was then referred to ear nose and throat which discovered that she most likely has a issue with the muscle tension in her [place].  She has been referred to speech therapy to help assist with this.  ROS: See pertinent positives and negatives per HPI.  Past Medical History:  Diagnosis Date  . Asthma   . Diabetes mellitus without complication ([place])   . Hypertension    Family History  Problem Relation Age of Onset  . Hypertension Mother   . Diabetes Mother   . Hypertension Father   . Diabetes Father    Social History   Tobacco Use  . Smoking status: Former Smoker    Packs/day: 1.00    Years: 10.00    Pack years: 10.00  . Smokeless tobacco: Never Used  Substance Use Topics  . Alcohol use: Never    Frequency: Never    Current Outpatient Medications:  .  albuterol (PROVENTIL HFA;VENTOLIN HFA) 108 (90 Base) MCG/ACT inhaler, Inhale 2 puffs into the lungs every 6 (six) hours as needed for wheezing or shortness of breath., Disp: 1 Inhaler, Rfl: 2 .  amLODipine (NORVASC) 10 MG tablet, Take 1 tablet (10 mg total) by mouth daily., Disp: 90 tablet, Rfl: 3 .  aspirin 81 MG chewable tablet, Chew 81 mg by mouth daily., Disp: , Rfl:  .  carvedilol (COREG) 25 MG tablet, Take 1 tablet (25 mg total) by  mouth 2 (two) times daily with a meal., Disp: 90 tablet, Rfl: 3 .  cloNIDine (CATAPRES) 0.1 MG tablet, Take 1 tablet (0.1 mg total) by mouth 2 (two) times daily., Disp: 180 tablet, Rfl: 3 .  Dulaglutide (TRULICITY) 1.5 UJ/8.1XB SOPN, Inject 1.5 mg into the skin every 7 (seven) days., Disp: 4 pen, Rfl: 5 .  Ergocalciferol 50 MCG (2000 UT) CAPS, Take 1 capsule by mouth daily., Disp: 90 capsule, Rfl: 1 .  lovastatin (MEVACOR) 20 MG tablet, Take 1 tablet (20 mg total) by mouth at bedtime., Disp: 90 tablet, Rfl:  3  EXAM:  Vitals:   05/07/18 0815  BP: (!) 165/109  Barajas: 78  Resp: 18  Temp: 98.4 F (36.9 C)  BP taken before her AM BP meds, she will recheck BP later today in afternoon.  Blood sugar this AM: 149   GENERAL: alert, oriented, appears well and in no acute distress  HEENT: atraumatic, conjunttiva clear, no obvious abnormalities on inspection of external nose and ears  NECK: normal movements of the head and neck  LUNGS: on inspection no signs of respiratory distress, breathing rate appears normal, no obvious gross SOB, gasping or wheezing  CV: no obvious cyanosis  MS: moves all visible extremities without noticeable abnormality  PSYCH/NEURO: pleasant and cooperative, no obvious depression or anxiety, speech and thought processing grossly intact  ASSESSMENT AND PLAN:  Discussed the following assessment and plan:  Type 2 diabetes mellitus without complication, without long-term current use of insulin (HCC) - Plan: Dulaglutide (TRULICITY) 1.5 JY/7.8GN SOPN  Essential hypertension  Muscle tension dysphonia  Laryngeal spasm   Blood sugars well controlled on Trulicity.  Shows a refill given.  Blood pressures the most part are well controlled on current regimen, does not require medication refills at this time but she will continue all current medications.  She will continue follow-up as planned with ENT and speech therapy to work on her muscle tension dysphonia and material spasm.  Patient is glad that all of the testing with her lungs did not reveal any obvious breathing problem with her lungs, it was more related to upper airway dysfunction that was then discovered to be laryngeal spasm.  I discussed the assessment and treatment plan with the patient. The patient was provided an opportunity to ask questions and all were answered. The patient agreed with the plan and demonstrated an understanding of the instructions.   The patient was advised to call back or seek an  in-person evaluation if the symptoms worsen or if the condition fails to improve as anticipated.  We will otherwise have patient follow-up in 3 months for recheck on chronic medical conditions.  Jodelle Green, FNP

## 2018-05-07 NOTE — Telephone Encounter (Signed)
Please schedule 3 month follow up for patient  Thanks  LG

## 2018-05-07 NOTE — Telephone Encounter (Signed)
Called Pt and scheduled her F/U visit for 08/07/18 @ 8:20am

## 2018-06-04 ENCOUNTER — Telehealth: Payer: Self-pay | Admitting: Family Medicine

## 2018-06-04 NOTE — Telephone Encounter (Signed)
Copied from [place] [id number]. Topic: General - Inquiry >> Jun 04, 2018  4:34 PM Mathis Bud wrote: Reason for CRM: patient would like to change pharmacies for medication Dulaglutide (TRULICITY) 1.5 MG  [facility]ill, Patient is changing for insurance purposes.  Call [phone]

## 2018-06-05 ENCOUNTER — Other Ambulatory Visit: Payer: Self-pay | Admitting: Lab

## 2018-06-05 DIAGNOSIS — Z87891 Personal history of nicotine dependence: Secondary | ICD-10-CM | POA: Diagnosis not present

## 2018-06-05 DIAGNOSIS — J385 Laryngeal spasm: Secondary | ICD-10-CM | POA: Diagnosis not present

## 2018-06-05 DIAGNOSIS — R0602 Shortness of breath: Secondary | ICD-10-CM

## 2018-06-05 DIAGNOSIS — R490 Dysphonia: Secondary | ICD-10-CM | POA: Diagnosis not present

## 2018-06-05 NOTE — Telephone Encounter (Signed)
[facility] to [facility]

## 2018-06-28 ENCOUNTER — Telehealth: Payer: Self-pay | Admitting: *Deleted

## 2018-06-28 NOTE — Telephone Encounter (Signed)
Copied from [place] [phone]. Topic: General - Inquiry >> Jun 04, 2018  4:34 PM Mathis Bud wrote: Reason for CRM: patient would like to change pharmacies for medication Dulaglutide (TRULICITY) 1.5 MG  [facility] refill, Patient is changing for insurance purposes.  Call [phone] >> Jun 28, 2018 10:24 AM Leward Quan A wrote: Patient called to request an Rx for Dulaglutide (TRULICITY) 1.5 NG/3.9EV SOPN sent to the [facility] on file. Pharmacy request a 90 day supply 3 pens so patient can pay a $75 fee. Also patient does not have her dose for next Tuesday 07/03/2018 asking if there are any samples until she can get it from mail order can she get a call please to come and get one. Ph# [phone]

## 2018-07-03 ENCOUNTER — Telehealth: Payer: Self-pay | Admitting: Lab

## 2018-07-03 ENCOUNTER — Other Ambulatory Visit: Payer: Self-pay | Admitting: Lab

## 2018-07-03 DIAGNOSIS — E119 Type 2 diabetes mellitus without complications: Secondary | ICD-10-CM

## 2018-07-03 MED ORDER — TRULICITY 1.5 MG/0.5ML ~~LOC~~ SOAJ: 1.5000 mg | SUBCUTANEOUS | 5 refills | Status: DC

## 2018-07-03 NOTE — Telephone Encounter (Signed)
Refill request

## 2018-07-03 NOTE — Telephone Encounter (Signed)
Yes Refill for Trulicity. I will resubmit it

## 2018-07-03 NOTE — Telephone Encounter (Signed)
Rx refill sent in.

## 2018-07-03 NOTE — Telephone Encounter (Signed)
Pt is calling checking on the status of get sample of trulicity and to see if [facility] has been [facility]. Pt is out of trulicity and needs a dose today. Please call patient back

## 2018-07-04 ENCOUNTER — Other Ambulatory Visit: Payer: Self-pay

## 2018-07-04 DIAGNOSIS — E119 Type 2 diabetes mellitus without complications: Secondary | ICD-10-CM

## 2018-07-04 MED ORDER — TRULICITY 1.5 MG/0.5ML ~~LOC~~ SOAJ: 1.5000 mg | SUBCUTANEOUS | 5 refills | Status: DC

## 2018-07-27 DIAGNOSIS — E1122 Type 2 diabetes mellitus with diabetic chronic kidney disease: Secondary | ICD-10-CM | POA: Diagnosis not present

## 2018-07-27 DIAGNOSIS — I129 Hypertensive chronic kidney disease with stage 1 through stage 4 chronic kidney disease, or unspecified chronic kidney disease: Secondary | ICD-10-CM | POA: Diagnosis not present

## 2018-07-27 DIAGNOSIS — R809 Proteinuria, unspecified: Secondary | ICD-10-CM | POA: Diagnosis not present

## 2018-07-27 DIAGNOSIS — N2581 Secondary hyperparathyroidism of renal origin: Secondary | ICD-10-CM | POA: Diagnosis not present

## 2018-07-27 DIAGNOSIS — N183 Chronic kidney disease, stage 3 (moderate): Secondary | ICD-10-CM | POA: Diagnosis not present

## 2018-07-30 ENCOUNTER — Other Ambulatory Visit: Payer: Self-pay | Admitting: Family Medicine

## 2018-07-30 DIAGNOSIS — I10 Essential (primary) hypertension: Secondary | ICD-10-CM

## 2018-08-03 ENCOUNTER — Other Ambulatory Visit: Payer: Self-pay | Admitting: Lab

## 2018-08-03 ENCOUNTER — Other Ambulatory Visit: Payer: Self-pay

## 2018-08-03 ENCOUNTER — Telehealth: Payer: Self-pay | Admitting: Family Medicine

## 2018-08-03 DIAGNOSIS — I10 Essential (primary) hypertension: Secondary | ICD-10-CM

## 2018-08-03 MED ORDER — AMLODIPINE BESYLATE 10 MG PO TABS: 10.0000 mg | ORAL_TABLET | Freq: Every day | ORAL | 3 refills | Status: DC

## 2018-08-03 NOTE — Telephone Encounter (Signed)
Pt needed a Refill on Rx of  Amlodapine sent in

## 2018-08-03 NOTE — Telephone Encounter (Signed)
Pt states she needs a refill for carvedilol (COREG) 25 MG tablet  Pharmacy is [facility], [place]  Call pt @ [phone]

## 2018-08-07 ENCOUNTER — Ambulatory Visit (INDEPENDENT_AMBULATORY_CARE_PROVIDER_SITE_OTHER): Payer: Medicare Other | Admitting: Family Medicine

## 2018-08-07 ENCOUNTER — Encounter: Payer: Self-pay | Admitting: Family Medicine

## 2018-08-07 ENCOUNTER — Other Ambulatory Visit: Payer: Self-pay

## 2018-08-07 VITALS — BP 152/96 | HR 97 | Temp 98.8°F | Resp 18 | Ht 69.0 in | Wt 257.0 lb

## 2018-08-07 DIAGNOSIS — J385 Laryngeal spasm: Secondary | ICD-10-CM | POA: Diagnosis not present

## 2018-08-07 DIAGNOSIS — E119 Type 2 diabetes mellitus without complications: Secondary | ICD-10-CM | POA: Diagnosis not present

## 2018-08-07 DIAGNOSIS — R490 Dysphonia: Secondary | ICD-10-CM

## 2018-08-07 DIAGNOSIS — E782 Mixed hyperlipidemia: Secondary | ICD-10-CM | POA: Diagnosis not present

## 2018-08-07 DIAGNOSIS — E559 Vitamin D deficiency, unspecified: Secondary | ICD-10-CM | POA: Diagnosis not present

## 2018-08-07 DIAGNOSIS — N183 Chronic kidney disease, stage 3 unspecified: Secondary | ICD-10-CM

## 2018-08-07 DIAGNOSIS — I10 Essential (primary) hypertension: Secondary | ICD-10-CM

## 2018-08-07 DIAGNOSIS — E1122 Type 2 diabetes mellitus with diabetic chronic kidney disease: Secondary | ICD-10-CM

## 2018-08-07 LAB — COMPREHENSIVE METABOLIC PANEL
ALT: 10 U/L (ref 0–35)
AST: 11 U/L (ref 0–37)
Albumin: 4 g/dL (ref 3.5–5.2)
Alkaline Phosphatase: 83 U/L (ref 39–117)
BUN: 27 mg/dL — ABNORMAL HIGH (ref 6–23)
CO2: 29 mEq/L (ref 19–32)
Calcium: 9.5 mg/dL (ref 8.4–10.5)
Chloride: 103 mEq/L (ref 96–112)
Creatinine, Ser: 1.08 mg/dL (ref 0.40–1.20)
GFR: 51.97 mL/min — ABNORMAL LOW (ref >60.00)
Glucose, Bld: 152 mg/dL — ABNORMAL HIGH (ref 70–99)
Potassium: 4.1 mEq/L (ref 3.5–5.1)
Sodium: 140 mEq/L (ref 135–145)
Total Bilirubin: 0.6 mg/dL (ref 0.2–1.2)
Total Protein: 7.1 g/dL (ref 6.0–8.3)

## 2018-08-07 LAB — VITAMIN D 25 HYDROXY (VIT D DEFICIENCY, FRACTURES): VITD: 27.75 ng/mL — ABNORMAL LOW (ref 30.00–100.00)

## 2018-08-07 LAB — CBC
HCT: 37.4 % (ref 36.0–46.0)
Hemoglobin: 11.7 g/dL — ABNORMAL LOW (ref 12.0–15.0)
MCHC: 31.3 g/dL (ref 30.0–36.0)
MCV: 72 fl — ABNORMAL LOW (ref 78.0–100.0)
Platelets: 306 10*3/uL (ref 150.0–400.0)
RBC: 5.19 Mil/uL — ABNORMAL HIGH (ref 3.87–5.11)
RDW: 16.4 % — ABNORMAL HIGH (ref 11.5–15.5)
WBC: 6.9 10*3/uL (ref 4.0–10.5)

## 2018-08-07 LAB — LIPID PANEL
Cholesterol: 196 mg/dL (ref 0–200)
HDL: 83.8 mg/dL (ref >39.00)
LDL Cholesterol: 96 mg/dL (ref 0–99)
NonHDL: 112.24
Total CHOL/HDL Ratio: 2
Triglycerides: 83 mg/dL (ref 0.0–149.0)
VLDL: 16.6 mg/dL (ref 0.0–40.0)

## 2018-08-07 LAB — TSH: TSH: 1.36 u[IU]/mL (ref 0.35–4.50)

## 2018-08-07 LAB — HEMOGLOBIN A1C: Hgb A1c MFr Bld: 8.1 % — ABNORMAL HIGH (ref 4.6–6.5)

## 2018-08-07 MED ORDER — CARVEDILOL 25 MG PO TABS: 25.0000 mg | ORAL_TABLET | Freq: Two times a day (BID) | ORAL | 3 refills | Status: DC

## 2018-08-07 MED ORDER — TRULICITY 1.5 MG/0.5ML ~~LOC~~ SOAJ: 1.5000 mg | SUBCUTANEOUS | 3 refills | Status: DC

## 2018-08-07 NOTE — Progress Notes (Signed)
Subjective:    Patient ID: Nancy Barajas, female    DOB: 04/09/1960, 58 y.o.   MRN: [id number]  HPI  Patient presents to clinic for follow-up on diabetes, blood pressure, cholesterol, kidney function and dysphonia/laryngeal spasm.  Patient is blood sugars overall have been running in the 130s to 160s.  Does have an occasional reading in the 205 to 215 range but usually can associate this with drinking sweet drinks or eating something more sugary or having bread as main ingredient.  Tolerates Trulicity without any problems, really likes this medication as it is taken once per week.  Has been monitoring her blood pressure at home using a cuff.  Usually readings are in the 140s over 90s to 150 over 90s range.  Currently she is on amlodipine 10 mg daily, Coreg 25 mg twice daily and clonidine 0.1 mg twice daily.  Tolerating statin without any problems.  Denies joint aches or belly pain.  Patient's most recent kidney functions have her in CKD stage III.  We will follow-up with new lab work and she also does see nephrology for checkups approximately every 6 months.  Patient was having a lot of problems with losing her voice, sounding breathless when talking.  She was seen in with pulmonology and ENT and it was discovered she is having dysphonia related to laryngeal spasm and her breathing was not affected.  Patient Active Problem List   Diagnosis Date Noted  . Laryngeal spasm 05/07/2018  . Muscle tension dysphonia 05/07/2018  . Shortness of breath 01/22/2018  . Essential hypertension 01/22/2018  . Type 2 diabetes mellitus without complication, without long-term current use of insulin ([place]) 01/22/2018  . Mixed hyperlipidemia 01/22/2018  . Mild persistent asthma 01/22/2018   Social History   Tobacco Use  . Smoking status: Former Smoker    Packs/day: 1.00    Years: 10.00    Pack years: 10.00  . Smokeless tobacco: Never Used  Substance Use Topics  . Alcohol use: Never    Frequency:  Never   Review of Systems  Constitutional: Negative for chills, fatigue and fever.  HENT: Negative for congestion, ear pain, sinus pain and sore throat.   Eyes: Negative.   Respiratory: Negative for cough, shortness of breath and wheezing.   Cardiovascular: Negative for chest pain, palpitations and leg swelling.  Gastrointestinal: Negative for abdominal pain, diarrhea, nausea and vomiting.  Genitourinary: Negative for dysuria, frequency and urgency.  Musculoskeletal: Negative for arthralgias and myalgias.  Skin: Negative for color change, pallor and rash.  Neurological: Negative for syncope, light-headedness and headaches.  Psychiatric/Behavioral: The patient is not nervous/anxious.       Objective:   Physical Exam Vitals signs and nursing note reviewed.  Constitutional:      General: She is not in acute distress.    Appearance: She is normal weight. She is not ill-appearing, toxic-appearing or diaphoretic.  HENT:     Head: Normocephalic and atraumatic.     Mouth/Throat:     Comments: Voice sounds "breathy" but this is her normal Eyes:     General: No scleral icterus.    Extraocular Movements: Extraocular movements intact.     Conjunctiva/sclera: Conjunctivae normal.     Pupils: Pupils are equal, round, and reactive to light.  Neck:     Musculoskeletal: Normal range of motion and neck supple. No neck rigidity.  Cardiovascular:     Rate and Rhythm: Normal rate and regular rhythm.     Heart sounds: Normal heart sounds.  Pulmonary:  Effort: Pulmonary effort is normal. No respiratory distress.     Breath sounds: Normal breath sounds. No wheezing, rhonchi or rales.  Musculoskeletal:     Right lower leg: No edema.     Left lower leg: No edema.  Lymphadenopathy:     Cervical: No cervical adenopathy.  Skin:    General: Skin is warm and dry.     Coloration: Skin is not jaundiced or pale.  Neurological:     General: No focal deficit present.     Mental Status: She is alert  and oriented to person, place, and time.  Psychiatric:        Mood and Affect: Mood normal.        Behavior: Behavior normal.        Thought Content: Thought content normal.        Judgment: Judgment normal.    Vitals:   08/07/18 0824  BP: (!) 152/96  Pulse: 97  Resp: 18  Temp: 98.8 F (37.1 C)  SpO2: 98%      Assessment & Plan:   Type 2 diabetes - overall tolerating Trulicity well.  Blood sugars for the most part per her notebook log look very good.  We will check A1c.  Healthy eating and monitoring sugary and or high carbohydrate foods.  Hypertension - we will trial increasing clonidine from 0.1 mg twice daily to 0.2 mg twice daily and she will continue Coreg twice daily amlodipine daily as prescribed.   Hyperlipidemia - she will continue statin, we will get new lipid panel in clinic today.  Chronic kidney disease stage III -we will get new kidney functions in lab work today.   Vit D deficiency-we will include vitamin D level and lab work, has been vitamin D deficient in the past but did replace orally with a vitamin supplement.  Dysphonia/laryngeal spasm - she will follow regularly with ENT as she has been doing.  Patient will follow-up in 2 weeks for blood pressure nurse visit to monitor after increasing clonidine.  We will also plan for 93-month follow-up in clinic for management of chronic conditions.  She is aware of vaccines will be available in September.

## 2018-08-07 NOTE — Patient Instructions (Signed)
Increase clonidine to 0.2 mg twice per day -- Take 2 of your 0.1 mg tablets 2 times per day

## 2018-08-08 ENCOUNTER — Other Ambulatory Visit: Payer: Self-pay | Admitting: Nephrology

## 2018-08-08 DIAGNOSIS — N183 Chronic kidney disease, stage 3 unspecified: Secondary | ICD-10-CM

## 2018-08-10 ENCOUNTER — Ambulatory Visit: Payer: Self-pay

## 2018-08-10 MED ORDER — VITAMIN D-3 125 MCG (5000 UT) PO TABS: 1.0000 | ORAL_TABLET | Freq: Every day | ORAL | 3 refills | Status: AC

## 2018-08-10 MED ORDER — EMPAGLIFLOZIN 25 MG PO TABS: 25.0000 mg | ORAL_TABLET | Freq: Every day | ORAL | 1 refills | Status: DC

## 2018-08-10 NOTE — Telephone Encounter (Signed)
Provided lab notes of Philis Nettle FNP on 08/10/2018 to patient.  Patient voices understanding .  Transferreed to office to make a 62month f/u lab appointment.

## 2018-08-10 NOTE — Addendum Note (Signed)
Addended by: Philis Nettle on: 08/10/2018 09:07 AM   Modules accepted: Orders

## 2018-08-15 DIAGNOSIS — R490 Dysphonia: Secondary | ICD-10-CM | POA: Diagnosis not present

## 2018-08-15 DIAGNOSIS — J385 Laryngeal spasm: Secondary | ICD-10-CM | POA: Diagnosis not present

## 2018-08-15 DIAGNOSIS — Z87891 Personal history of nicotine dependence: Secondary | ICD-10-CM | POA: Diagnosis not present

## 2018-08-22 ENCOUNTER — Ambulatory Visit: Payer: Medicare Other

## 2018-08-22 ENCOUNTER — Other Ambulatory Visit: Payer: Self-pay

## 2018-08-22 ENCOUNTER — Ambulatory Visit (INDEPENDENT_AMBULATORY_CARE_PROVIDER_SITE_OTHER): Payer: Medicare Other | Admitting: Family Medicine

## 2018-08-22 VITALS — BP 178/108 | HR 96 | Temp 97.5°F | Resp 18 | Ht 69.0 in | Wt 254.4 lb

## 2018-08-22 VITALS — HR 96 | Temp 97.5°F | Resp 18 | Ht 69.0 in | Wt 254.4 lb

## 2018-08-22 DIAGNOSIS — R490 Dysphonia: Secondary | ICD-10-CM | POA: Diagnosis not present

## 2018-08-22 DIAGNOSIS — N183 Chronic kidney disease, stage 3 unspecified: Secondary | ICD-10-CM | POA: Insufficient documentation

## 2018-08-22 DIAGNOSIS — E785 Hyperlipidemia, unspecified: Secondary | ICD-10-CM

## 2018-08-22 DIAGNOSIS — E119 Type 2 diabetes mellitus without complications: Secondary | ICD-10-CM

## 2018-08-22 DIAGNOSIS — E1122 Type 2 diabetes mellitus with diabetic chronic kidney disease: Secondary | ICD-10-CM | POA: Insufficient documentation

## 2018-08-22 DIAGNOSIS — I10 Essential (primary) hypertension: Secondary | ICD-10-CM

## 2018-08-22 MED ORDER — HYDRALAZINE HCL 25 MG PO TABS: 25.0000 mg | ORAL_TABLET | Freq: Two times a day (BID) | ORAL | 1 refills | Status: DC

## 2018-08-22 NOTE — Progress Notes (Unsigned)
Duplicate  Changed from nurse visit to visit with NP

## 2018-08-22 NOTE — Progress Notes (Signed)
Subjective:    Patient ID: Nancy Barajas, female    DOB: March 06, 1960, 58 y.o.   MRN: [id number]  HPI   HPI  Patient presents to clinic for blood pressure follow-up.  Originally visit was scheduled for a nurse visit blood pressure check, but blood pressure was elevated in the 170s over 100s, so we switched over to office visit with me.  Patient has had historically difficult to control blood pressure and also has chronic kidney disease stage III and type 2 diabetes.  Currently she is on Coreg twice per day 25 mg, amlodipine once per day 10 mg and clonidine 3 times per day at 0.2 mg.  States even with monitoring pressure at home usually will have readings in the 150s 160s over 80s or 90s most often.  Denies chest pain, shortness of breath, palpitations.  Denies lower extremity swelling.  Last A1c was 8.1%.  We recently added Jardiance on top of her once weekly Trulicity.  So far tolerating this well.  States she has 2 doses of Trulicity left and after that we will be unable to afford it due to going to [employer].  Patient has also chronic dysphonia related to laryngeal spasm. Voice always sounds "breathy or breathless".  Tolerating her statin with no problems.  Patient Active Problem List   Diagnosis Date Noted  . CKD stage 3 secondary to diabetes ([place]) 08/22/2018  . Laryngeal spasm 05/07/2018  . Muscle tension dysphonia 05/07/2018  . Shortness of breath 01/22/2018  . Uncontrolled hypertension 01/22/2018  . Type 2 diabetes mellitus without complication, without long-term current use of insulin ([place]) 01/22/2018  . Mixed hyperlipidemia 01/22/2018  . Mild persistent asthma 01/22/2018   Social History   Tobacco Use  . Smoking status: Former Smoker    Packs/day: 1.00    Years: 10.00    Pack years: 10.00  . Smokeless tobacco: Never Used  Substance Use Topics  . Alcohol use: Never    Frequency: Never    Review of Systems  Constitutional: Negative for chills, fatigue  and fever.  HENT: Negative for congestion, ear pain, sinus pain and sore throat.   Eyes: Negative.   Respiratory: Negative for cough, shortness of breath and wheezing.   Cardiovascular: Negative for chest pain, palpitations and leg swelling.  Gastrointestinal: Negative for abdominal pain, diarrhea, nausea and vomiting.  Genitourinary: Negative for dysuria, frequency and urgency.  Musculoskeletal: Negative for arthralgias and myalgias.  Skin: Negative for color change, pallor and rash.  Neurological: Negative for syncope, light-headedness and headaches.  Psychiatric/Behavioral: The patient is not nervous/anxious.       Objective:   Physical Exam Vitals signs and nursing note reviewed.  Constitutional:      General: She is not in acute distress.    Appearance: She is obese. She is not ill-appearing, toxic-appearing or diaphoretic.  HENT:     Head: Normocephalic and atraumatic.     Mouth/Throat:     Comments: Chronic dysphonia related to laryngeal spasm Eyes:     General: No scleral icterus.    Extraocular Movements: Extraocular movements intact.     Conjunctiva/sclera: Conjunctivae normal.     Pupils: Pupils are equal, round, and reactive to light.  Neck:     Musculoskeletal: Normal range of motion and neck supple. No neck rigidity.     Vascular: No carotid bruit.  Cardiovascular:     Rate and Rhythm: Normal rate and regular rhythm.  Pulmonary:     Effort: Pulmonary effort is normal. No  respiratory distress.     Breath sounds: Normal breath sounds.  Musculoskeletal:     Right lower leg: No edema.     Left lower leg: No edema.  Skin:    General: Skin is warm and dry.     Coloration: Skin is not jaundiced or pale.  Neurological:     General: No focal deficit present.     Mental Status: She is alert and oriented to person, place, and time.     Gait: Gait normal.  Psychiatric:        Mood and Affect: Mood normal.        Behavior: Behavior normal.     BP Readings from  Last 3 Encounters:  08/22/18 (!) 178/108  08/07/18 (!) 152/96  05/07/18 (!) 165/109   Today's Vitals   08/22/18 0832 08/22/18 0833  BP: (!) 178/102 (!) 178/108  Pulse: 91 96  Resp: 18   Temp: (!) 97.5 F (36.4 C) (!) 97.5 F (36.4 C)  TempSrc: Temporal Temporal  SpO2: 97%   Weight: 254 lb 6.4 oz (115.4 kg)   Height: 5\' 9"  (1.753 m)    Body mass index is 37.57 kg/m.     Assessment & Plan:   Uncontrolled hypertension-she will continue Coreg twice daily, amlodipine daily, clonidine twice daily and we will also trial add on hydralazine 2 times per day and consider increasing to 3 times a day if needed.  Type 2 diabetes-she will continue Trulicity once weekly, I was able to give patient 4 sample pens so she has supply until we can figure out if we can qualify her for some sort of medication assistance.  She will also continue Jardiance daily.  Hyperlipidemia-she will continue lovastatin and daily aspirin.  Chronic kidney disease stage III-she will continue regular follow-up with nephrology.  Also discussed with patient importance of [place] getting blood pressure under good control and being conscious of our medication choices due to her chronic kidney disease.  Chronic dysphonia-patient has seen ENT and does follow regularly with them.  Dysphonia is suspected to be related to muscle laryngeal spasms  We will place referral to chronic care management to help [place] maximize her medications to better control diabetes, blood pressure.  Patient aware she will be hearing from our pharmacist to get this appointment scheduled so we can get her on best medication regimen possible.  She will keep 72-month follow-up as planned in November at this time.  She is aware she can return to clinic at any point if issues arise.

## 2018-08-22 NOTE — Progress Notes (Unsigned)
Subjective:    Patient ID: Nancy Barajas, female    DOB: [birth year], 59 y.o.   MRN: [id number]  HPI  Patient presents to clinic for blood pressure follow-up.  Originally visit was scheduled for a nurse visit blood pressure check, but blood pressure was elevated in the 170s over 100s, so we switched over to office visit with me.  Patient has had historically difficult to control blood pressure and also has chronic kidney disease stage III and type 2 diabetes.  Currently she is on Coreg twice per day 25 mg, amlodipine once per day 10 mg and clonidine 3 times per day at 0.2 mg.  States even with monitoring pressure at home usually will have readings in the 150s 160s over 80s or 90s most often.  Denies chest pain, shortness of breath, palpitations.  Denies lower extremity swelling.  Last A1c was 8.1%.  We recently added Jardiance on top of her once weekly Trulicity.  So far tolerating this well.  States she has 2 doses of Trulicity left and after that we will be unable to afford it due to going to [employer]ut [employer].  Patient has also chronic dysphonia related to laryngeal spasm. Voice always sounds "breathy or breathless".  Tolerating her statin with no problems.   Patient Active Problem List   Diagnosis Date Noted  . Laryngeal spasm 05/07/2018  . Muscle tension dysphonia 05/07/2018  . Shortness of breath 01/22/2018  . Essential hypertension 01/22/2018  . Type 2 diabetes mellitus without complication, without long-term current use of insulin ([place]) 01/22/2018  . Mixed hyperlipidemia 01/22/2018  . Mild persistent asthma 01/22/2018   Social History   Tobacco Use  . Smoking status: Former Smoker    Packs/day: 1.00    Years: 10.00    Pack years: 10.00  . Smokeless tobacco: Never Used  Substance Use Topics  . Alcohol use: Never    Frequency: Never   Review of Systems  Constitutional: Negative for chills, fatigue and fever.  HENT: Negative for congestion, ear pain, sinus pain and sore  throat.   Eyes: Negative.   Respiratory: Negative for cough, shortness of breath and wheezing.   Cardiovascular: Negative for chest pain, palpitations and leg swelling.  Gastrointestinal: Negative for abdominal pain, diarrhea, nausea and vomiting.  Genitourinary: Negative for dysuria, frequency and urgency.  Musculoskeletal: Negative for arthralgias and myalgias.  Skin: Negative for color change, pallor and rash.  Neurological: Negative for syncope, light-headedness and headaches.  Psychiatric/Behavioral: The patient is not nervous/anxious.       Objective:   Physical Exam Vitals signs and nursing note reviewed.  Constitutional:      General: She is not in acute distress.    Appearance: She is obese. She is not ill-appearing, toxic-appearing or diaphoretic.  HENT:     Head: Normocephalic and atraumatic.     Mouth/Throat:     Comments: Chronic dysphonia related to laryngeal spasm Eyes:     General: No scleral icterus.    Extraocular Movements: Extraocular movements intact.     Conjunctiva/sclera: Conjunctivae normal.     Pupils: Pupils are equal, round, and reactive to light.  Cardiovascular:     Rate and Rhythm: Normal rate and regular rhythm.     Heart sounds: Normal heart sounds.  Pulmonary:     Effort: Pulmonary effort is normal. No respiratory distress.     Breath sounds: Normal breath sounds.  Musculoskeletal:     Right lower leg: No edema.     Left lower leg:  No edema.  Skin:    General: Skin is warm and dry.     Coloration: Skin is not jaundiced or pale.  Neurological:     General: No focal deficit present.     Mental Status: She is alert and oriented to person, place, and time.     Gait: Gait normal.  Psychiatric:        Mood and Affect: Mood normal.        Behavior: Behavior normal.    BP Readings from Last 3 Encounters:  08/22/18 (!) 178/108  08/07/18 (!) 152/96  05/07/18 (!) 165/109   Today's Vitals   08/22/18 0912  Pulse: 96  Resp: 18  Temp: (!)  97.5 F (36.4 C)  SpO2: 97%  Weight: 254 lb 6.4 oz (115.4 kg)  Height: 5\' 9"  (1.753 m)   Body mass index is 37.57 kg/m.     Assessment & Plan:   Uncontrolled hypertension-she will continue Coreg twice daily, amlodipine daily, clonidine twice daily and we will also trial add on hydralazine 2 times per day and consider increasing to 3 times a day if needed.  Type 2 diabetes-she will continue Trulicity once weekly, I was able to give patient 4 sample pens so she has supply until we can figure out if we can qualify her for some sort of medication assistance.  She will also continue Jardiance daily.  Hyperlipidemia-she will continue lovastatin and daily aspirin.  Chronic kidney disease stage III-she will continue regular follow-up with nephrology.  Also discussed with patient importance of [place] getting blood pressure under good control and being conscious of our medication choices due to her chronic kidney disease.  Chronic dysphonia-patient has seen ENT and does follow regularly with them.  Dysphonia is suspected to be related to muscle laryngeal spasms  We will place referral to chronic care management to help [place] maximize her medications to better control diabetes, blood pressure.  Patient aware she will be hearing from our pharmacist to get this appointment scheduled so we can get her on best medication regimen possible.  She will keep 4-month follow-up as planned in November at this time.  She is aware she can return to clinic at any point if issues arise.

## 2018-08-22 NOTE — Addendum Note (Signed)
Addended by: Philis Nettle on: 08/22/2018 09:21 AM   Modules accepted: Level of Service

## 2018-08-22 NOTE — Patient Instructions (Addendum)
Continue COREG 2x per day, amlodipine 1x per day and clonidine 2x per day.  Add on hydralazine 25 mg 2x per day -- potentially may increase this to 3x per day if needed

## 2018-08-22 NOTE — Patient Instructions (Signed)
Continue COREG 2x per day, amlodipine 1x per day and clonidine 2x per day.  Add on hydralazine 25 mg 2x per day -- potentially may increase this to 3x per day if needed

## 2018-08-23 NOTE — Progress Notes (Addendum)
Can we delete this chart? Visit was changed from nurse to NP and now it wants me to sign, but I cannot due to chart being listed under nurse and I cannot get this chart out of my inbasket?

## 2018-08-27 ENCOUNTER — Ambulatory Visit (INDEPENDENT_AMBULATORY_CARE_PROVIDER_SITE_OTHER): Payer: Medicare Other | Admitting: Pharmacist

## 2018-08-27 DIAGNOSIS — N183 Chronic kidney disease, stage 3 unspecified: Secondary | ICD-10-CM

## 2018-08-27 DIAGNOSIS — E1122 Type 2 diabetes mellitus with diabetic chronic kidney disease: Secondary | ICD-10-CM | POA: Diagnosis not present

## 2018-08-27 DIAGNOSIS — E119 Type 2 diabetes mellitus without complications: Secondary | ICD-10-CM | POA: Diagnosis not present

## 2018-08-27 NOTE — Patient Instructions (Addendum)
Visit Information  [name],   It was great talking with you today!  We discussed your blood pressure and blood sugar. Keep checking blood pressure daily. For blood sugar, start occasionally checking about 2 hours after a meal. Our goal fasting sugars are less than 130 and goal 2 hour after meal sugars are less than 180. We will see the impact Jardiance has on your sugars to help determine our next steps.   Once I receive your portions and Lauren Guse's portions of the applications for Trulicity and Jardiance, I will fax to these drug companies. I will follow up with them and keep you up to date when we know something.   Let me know when you hear from the [place] on the [employer] Extra Help Application. I may need a copy of that letter that they send you.   Please feel free to reach out with any questions! [name], PharmD, [place]      Goals Addressed            This Visit's Progress     Patient Stated   . "I want to control my diabetes" (pt-stated)       Current Barriers:  . Diabetes: uncontrolled; most recent A1c 8.1% o Diagnosed in 2016  o Previously on metformin, stopped d/t kidney function decline . Current antihyperglycemic regimen: Trulicity 1.5 mg once weekly, Jardiance 25 mg daily  - added ~8/21 . Reports occasional dizziness (though reports sugars are high at these times), always sweats a lot  . Current meal patterns; does have food stamps; was in a homeless shelter last year; o Breakfast: Catfish nuggets; scrambled eggs, bacon; water  o Lunch: Piece of chicken;  o Supper: Chicken; sometimes fast food o Snacks: Occasional chips,  o Drinks: Water; occasional Sprite  . Current exercise: Notes "anxiety and depression" surrounding current events contributing to lack of motivation and exercise o Notes at this time last year, she was in a homeless shelter . Current blood glucose readings:   Fasting  1-Aug 158  3-Aug 215  4-Aug  174  6-Aug 167  7-Aug 195  8-Aug 151  9-Aug 140  10-Aug 194  11-Aug 161  12-Aug 177  13-Aug 155  14-Aug 138  17-Aug 167  18-Aug 136  21-Aug *178  22-Aug 171  23-Aug 192  24-Aug 183  Average 169  *Jardiance added  . Cardiovascular risk reduction: o Current hypertensive regimen: carvedilol 25 mg BID, amlodipine 10 mg daily, clonidine 0.2 mg TID, hydralazine 25 mg BID; see BP goal o Current hyperlipidemia regimen: lovastatin 20 mg daily; last LDL at goal <100, though targeting LDL <70 likely appropriate given personal and family risk; 10 year ASCVD risk 26% on lovastatin therapy   Pharmacist Clinical Goal(s):  Marland Kitchen Over the next 90 days, patient with work with PharmD and primary care provider to address optimized glycemic management  Interventions: . Comprehensive medication review performed, medication list updated in electronic medical record . Discussed that we likely have not seen full benefit of Jardiance therapy yet. Will give 2-3 more weeks on this regimen before deciding if additional pharmacotherapy is needed - recommend basal insulin as next step.  . Discussed cost concerns. Patient applied for Medicare Extra Help ~ 1 week ago; this determination generally takes ~3-6 weeks to arrive in the mail. In the meantime, we will pursue Trulicity [employer]) and Jardiance [employer]) patient assistance until the determination comes through. Patient will bring proof of income and copy  of prescription insurance card to clinic and will sign patient portion; will collaborate with primary care provider Philis Nettle, NP for provider portion . Counseled patient to begin checking 2 hour post prandial readings occasionally after supper . Patient expressed financial concerns with affording food - have put in referral for Care Guide to outreach patient.  . Moving forward, consider d/c lovastatin and choosing high intensity statin given 10 year ASCVD risk >25%, even on statin therapy    Patient Self Care Activities:  . Patient will check blood glucose BID, document, and provide at future appointments . Patient will take medications as prescribed . Patient will report any questions or concerns to provider   Initial goal documentation     . "My BP is hard to control" (pt-stated)       Current Barriers:  . Uncontrolled hypertension, complicated by DM, CKD o Follows with [facility] [facility] [employer] [facility] . Current antihypertensive regimen: carvedilol 25 mg BID, amlodipine 10 mg daily, clonidine 0.2 mg TID, hydralazine 25 mg BID (started this medication yesterday) . Previous antihypertensives tried: no documented hx ACEi/ARB; spironolactone d/c d/t declining renal function in 02/2018;  . Current home BP readings:   BP HR  1-Aug 151/99 91  3-Aug    4-Aug 132/92 82  6-Aug 122/87 83  7-Aug    8-Aug 131/95 74  9-Aug 151/108 83  10-Aug    11-Aug 138/84 96  12-Aug    13-Aug 133/92 90  14-Aug 143/94 82  17-Aug 132/94 93  18-Aug 144/79 81  21-Aug    22-Aug 151/97 78  23-Aug 141/90 92  24-Aug 148/106 91    Pharmacist Clinical Goal(s):  Marland Kitchen Over the next 90 days, patient will work with PharmD and providers to optimize antihypertensive regimen  Interventions: . Comprehensive medication review performed; medication list updated in the electronic medical record.  . Discussed that it will take more time to see full effect of hydralazine. Will continue to monitor for the next 2-3 weeks . Patient did note some "dizziness", which could be attributed to clonidine therapy. Will keep in mind moving forward.   Patient Self Care Activities:  . Patient will continue to check BP daily, document, and provide at future appointments . Patient will focus on medication adherence   Initial goal documentation        [name] was given information about Chronic Care Management services today including:  1. CCM service includes personalized support from designated clinical staff  supervised by her physician, including individualized plan of care and coordination with other care providers 2. 24/7 contact phone numbers for assistance for urgent and routine care needs. 3. Service will only be billed when office clinical staff spend 20 minutes or more in a month to coordinate care. 4. Only one practitioner may furnish and bill the service in a calendar month. 5. The patient may stop CCM services at any time (effective at the end of the month) by phone call to the office staff. 6. The patient will be responsible for cost sharing (co-pay) of up to 20% of the service fee (after annual deductible is met).  Patient agreed to services and verbal consent obtained.   Print copy of patient instructions provided.   Plan: - Will collaborate with patient and provider to complete patient assistance applications - Scheduled outreach with patient in 3 weeks for follow up on BG/BP  [name], PharmD, [place] Pharmacist [facility] [phone]

## 2018-08-27 NOTE — Chronic Care Management (AMB) (Signed)
Chronic Care Management   Note  08/27/2018 Name: Nancy Barajas MRN: [id number] DOB: 20-Dec-1960   Subjective:  Nancy Barajas is a 58 y.o. year old female who is a primary care patient of Guse, Jacquelynn Cree, FNP. The CCM team was consulted for assistance with chronic disease management and care coordination needs.     [name] was given information about Chronic Care Management services today including:  1. CCM service includes personalized support from designated clinical staff supervised by her physician, including individualized plan of care and coordination with other care providers 2. 24/7 contact phone numbers for assistance for urgent and routine care needs. 3. Service will only be billed when office clinical staff spend 20 minutes or more in a month to coordinate care. 4. Only one practitioner may furnish and bill the service in a calendar month. 5. The patient may stop CCM services at any time (effective at the end of the month) by phone call to the office staff. 6. The patient will be responsible for cost sharing (co-pay) of up to 20% of the service fee (after annual deductible is met).  Patient agreed to services and verbal consent obtained.   Review of patient status, including review of consultants reports, laboratory and other test data, was performed as part of comprehensive evaluation and provision of chronic care management services.   Objective:  Lab Results  Component Value Date   CREATININE 1.08 08/07/2018   CREATININE 1.30 (H) 02/07/2018   CREATININE 1.54 (H) 01/22/2018    Lab Results  Component Value Date   HGBA1C 8.1 (H) 08/07/2018       Component Value Date/Time   CHOL 196 08/07/2018 0844   TRIG 83.0 08/07/2018 0844   HDL 83.80 08/07/2018 0844   CHOLHDL 2 08/07/2018 0844   VLDL 16.6 08/07/2018 0844   LDLCALC 96 08/07/2018 0844    Clinical ASCVD: No  ASCVD risk score 26%  BP Readings from Last 3 Encounters:  08/22/18 (!) 178/108  08/07/18  (!) 152/96  05/07/18 (!) 165/109    Allergies  Allergen Reactions  . Penicillins Rash    Medications Reviewed Today    Reviewed by De Hollingshead, [facility] (Pharmacist) on 08/27/18 at 1108  Med List Status: <None>  Medication Order Taking? Sig Documenting Provider Last Dose Status Informant       Patient not taking:      Discontinued 08/27/18 1108 (Patient Preference)   amLODipine (NORVASC) 10 MG tablet [id number] Yes Take 1 tablet (10 mg total) by mouth daily. Jodelle Green, FNP Taking Active   aspirin 81 MG chewable tablet [id number] Yes Chew 81 mg by mouth daily. [provider] Taking Active   carvedilol (COREG) 25 MG tablet [id number] Yes Take 1 tablet (25 mg total) by mouth 2 (two) times daily with a meal. Guse, Jacquelynn Cree, FNP Taking Active   Cholecalciferol (VITAMIN D-3) 125 MCG (5000 UT) TABS [id number] Yes Take 1 tablet by mouth daily. Jodelle Green, FNP Taking Active   cloNIDine (CATAPRES) 0.1 MG tablet [id number] Yes Take 1 tablet (0.1 mg total) by mouth 2 (two) times daily.  Patient taking differently: Take 0.2 mg by mouth 2 (two) times daily.    Jodelle Green, FNP Taking Active   Dulaglutide (TRULICITY) 1.5 PY/0.9XI SOPN [id number] Yes Inject 1.5 mg into the skin every 7 (seven) days. Jodelle Green, FNP Taking Active   empagliflozin (JARDIANCE) 25 MG TABS tablet [id number] Yes Take 25 mg by mouth daily. Jodelle Green, FNP  Taking Active         Discontinued 08/27/18 1106 (Change in therapy)   hydrALAZINE (APRESOLINE) 25 MG tablet [id number] Yes Take 1 tablet (25 mg total) by mouth 2 (two) times daily. Jodelle Green, FNP Taking Active   lovastatin (MEVACOR) 20 MG tablet [id number] Yes Take 1 tablet (20 mg total) by mouth at bedtime. Jodelle Green, FNP Taking Active            Assessment:   Goals Addressed            This Visit's Progress     Patient Stated   . "I want to control my diabetes" (pt-stated)       Current Barriers:  . Diabetes: uncontrolled;  most recent A1c 8.1% o Diagnosed in 2016  o Previously on metformin, stopped d/t kidney function decline . Current antihyperglycemic regimen: Trulicity 1.5 mg once weekly, Jardiance 25 mg daily  - added ~8/21 . Reports occasional dizziness (though reports sugars are high at these times), always sweats a lot  . Current meal patterns; does have food stamps; was in a homeless shelter last year; o Breakfast: Catfish nuggets; scrambled eggs, bacon; water  o Lunch: Piece of chicken;  o Supper: Chicken; sometimes fast food o Snacks: Occasional chips,  o Drinks: Water; occasional Sprite  . Current exercise: Notes "anxiety and depression" surrounding current events contributing to lack of motivation and exercise o Notes at this time last year, she was in a homeless shelter . Current blood glucose readings:   Fasting  1-Aug 158  3-Aug 215  4-Aug 174  6-Aug 167  7-Aug 195  8-Aug 151  9-Aug 140  10-Aug 194  11-Aug 161  12-Aug 177  13-Aug 155  14-Aug 138  17-Aug 167  18-Aug 136  21-Aug *178  22-Aug 171  23-Aug 192  24-Aug 183  Average 169  *Jardiance added  . Cardiovascular risk reduction: o Current hypertensive regimen: carvedilol 25 mg BID, amlodipine 10 mg daily, clonidine 0.2 mg TID, hydralazine 25 mg BID; see BP goal o Current hyperlipidemia regimen: lovastatin 20 mg daily; last LDL at goal <100, though targeting LDL <70 likely appropriate given personal and family risk; 10 year ASCVD risk 26% on lovastatin therapy   Pharmacist Clinical Goal(s):  Marland Kitchen Over the next 90 days, patient with work with PharmD and primary care provider to address optimized glycemic management  Interventions: . Comprehensive medication review performed, medication list updated in electronic medical record . Discussed that we likely have not seen full benefit of Jardiance therapy yet. Will give 2-3 more weeks on this regimen before deciding if additional pharmacotherapy is needed - recommend basal insulin as  next step.  . Discussed cost concerns. Patient applied for Medicare Extra Help ~ 1 week ago; this determination generally takes ~3-6 weeks to arrive in the mail. In the meantime, we will pursue Trulicity [employer]) and Jardiance [employer]) patient assistance until the determination comes through. Patient will bring proof of income and copy of prescription insurance card to clinic and will sign patient portion; will collaborate with primary care provider Philis Nettle, NP for provider portion . Counseled patient to begin checking 2 hour post prandial readings occasionally after supper . Patient expressed financial concerns with affording food - have put in referral for Care Guide to outreach patient.  . Moving forward, consider d/c lovastatin and choosing high intensity statin given 10 year ASCVD risk >25%, even on statin therapy   Patient Self Care Activities:  .  Patient will check blood glucose BID, document, and provide at future appointments . Patient will take medications as prescribed . Patient will report any questions or concerns to provider   Initial goal documentation     . "My BP is hard to control" (pt-stated)       Current Barriers:  . Uncontrolled hypertension, complicated by DM, CKD o Follows with [employer] . Current antihypertensive regimen: carvedilol 25 mg BID, amlodipine 10 mg daily, clonidine 0.2 mg TID, hydralazine 25 mg BID (started this medication yesterday) . Previous antihypertensives tried: no documented hx ACEi/ARB; spironolactone d/c d/t declining renal function in 02/2018;  . Current home BP readings:   BP HR  1-Aug 151/99 91  3-Aug    4-Aug 132/92 82  6-Aug 122/87 83  7-Aug    8-Aug 131/95 74  9-Aug 151/108 83  10-Aug    11-Aug 138/84 96  12-Aug    13-Aug 133/92 90  14-Aug 143/94 82  17-Aug 132/94 93  18-Aug 144/79 81  21-Aug    22-Aug 151/97 78  23-Aug 141/90 92  24-Aug 148/106 91    Pharmacist Clinical Goal(s):  Marland Kitchen  Over the next 90 days, patient will work with PharmD and providers to optimize antihypertensive regimen  Interventions: . Comprehensive medication review performed; medication list updated in the electronic medical record.  . Discussed that it will take more time to see full effect of hydralazine. Will continue to monitor for the next 2-3 weeks . Patient did note some "dizziness", which could be attributed to clonidine therapy. Will keep in mind moving forward.   Patient Self Care Activities:  . Patient will continue to check BP daily, document, and provide at future appointments . Patient will focus on medication adherence   Initial goal documentation        Plan: - Will collaborate with patient and provider to complete patient assistance applications - Scheduled outreach with patient in 3 weeks for follow up on BG/BP  [name], PharmD, [place] Pharmacist [place] [phone]

## 2018-08-29 ENCOUNTER — Telehealth: Payer: Self-pay

## 2018-08-29 NOTE — Telephone Encounter (Signed)
Copied from [place] [phone]. Topic: Referral - Status >> Aug 29, 2018 [id number] AM Simone Curia D wrote: [id number] Left message for patient to return my call  regarding food banks and counseling resources. Emailed resources to patient.  Will call to follow up. Ambrose Mantle [phone]

## 2018-09-04 DIAGNOSIS — R490 Dysphonia: Secondary | ICD-10-CM | POA: Diagnosis not present

## 2018-09-06 ENCOUNTER — Ambulatory Visit: Payer: Medicare Other | Admitting: Pharmacist

## 2018-09-06 DIAGNOSIS — E119 Type 2 diabetes mellitus without complications: Secondary | ICD-10-CM

## 2018-09-06 NOTE — Chronic Care Management (AMB) (Signed)
Chronic Care Management   Follow Up Note   09/06/2018 Name: Nancy Barajas MRN: [id number] DOB: 03-21-1960  Referred by: Nancy Green, FNP Reason for referral : Chronic Care Management (Medication Management)   Nancy Barajas is a 58 y.o. year old female who is a primary care patient of Guse, Nancy Cree, FNP. The CCM team was consulted for assistance with chronic disease management and care coordination needs.    Care coordination completed today.   Review of patient status, including review of consultants reports, relevant laboratory and other test results, and collaboration with appropriate care team members and the patient's provider was performed as part of comprehensive patient evaluation and provision of chronic care management services.    SDOH (Social Determinants of Health) screening performed today: Financial Strain . See Care Plan for related entries.   Outpatient Encounter Medications as of 09/06/2018  Medication Sig  . amLODipine (NORVASC) 10 MG tablet Take 1 tablet (10 mg total) by mouth daily.  Marland Kitchen aspirin 81 MG chewable tablet Chew 81 mg by mouth daily.  . carvedilol (COREG) 25 MG tablet Take 1 tablet (25 mg total) by mouth 2 (two) times daily with a meal.  . Cholecalciferol (VITAMIN D-3) 125 MCG (5000 UT) TABS Take 1 tablet by mouth daily.  . cloNIDine (CATAPRES) 0.1 MG tablet Take 1 tablet (0.1 mg total) by mouth 2 (two) times daily. (Patient taking differently: Take 0.2 mg by mouth 2 (two) times daily. )  . Dulaglutide (TRULICITY) 1.5 TJ/0.3ES SOPN Inject 1.5 mg into the skin every 7 (seven) days.  . empagliflozin (JARDIANCE) 25 MG TABS tablet Take 25 mg by mouth daily.  . hydrALAZINE (APRESOLINE) 25 MG tablet Take 1 tablet (25 mg total) by mouth 2 (two) times daily.  Marland Kitchen lovastatin (MEVACOR) 20 MG tablet Take 1 tablet (20 mg total) by mouth at bedtime.   No facility-administered encounter medications on file as of 09/06/2018.      Goals Addressed            This  Visit's Progress     Patient Stated   . "I want to control my diabetes" (pt-stated)       Current Barriers:  . Diabetes: uncontrolled; most recent A1c 8.1% o Diagnosed in 2016  o Previously on metformin, stopped d/t kidney function decline . Current antihyperglycemic regimen: Trulicity 1.5 mg once weekly, Jardiance 25 mg daily  - added ~8/21 . Reports occasional dizziness (though reports sugars are high at these times), always sweats a lot  . Current meal patterns; does have food stamps; was in a homeless shelter last year; o Breakfast: Catfish nuggets; scrambled eggs, bacon; water  o Lunch: Piece of chicken;  o Supper: Chicken; sometimes fast food o Snacks: Occasional chips,  o Drinks: Water; occasional Sprite  . Current exercise: Notes "anxiety and depression" surrounding current events contributing to lack of motivation and exercise o Notes at this time last year, she was in a homeless shelter . Cardiovascular risk reduction: o Current hypertensive regimen: carvedilol 25 mg BID, amlodipine 10 mg daily, clonidine 0.2 mg TID, hydralazine 25 mg BID; see BP goal o Current hyperlipidemia regimen: lovastatin 20 mg daily; last LDL at goal <100, though targeting LDL <70 likely appropriate given personal and family risk; 10 year ASCVD risk 26% on lovastatin therapy   Pharmacist Clinical Goal(s):  Marland Kitchen Over the next 90 days, patient with work with PharmD and primary care provider to address optimized glycemic management  Interventions: Marland Kitchen Met with patient; she  provided signature, income information, and copy of her prescription insurance card. Completed applications, submitted to [place] Clinical cytogeneticist) and [employer] (Trulicity).   Patient Self Care Activities:  . Patient will check blood glucose BID, document, and provide at future appointments . Patient will take medications as prescribed . Patient will report any questions or concerns to provider   Please see past updates related to  this goal by clicking on the "Past Updates" button in the selected goal         Plan:  - Will follow up with patient assistance programs in the next 1-2 weeks  [name], PharmD, [place] Pharmacist [place] [phone]

## 2018-09-06 NOTE — Patient Instructions (Signed)
Visit Information  Goals Addressed            This Visit's Progress     Patient Stated   . "I want to control my diabetes" (pt-stated)       Current Barriers:  . Diabetes: uncontrolled; most recent A1c 8.1% o Diagnosed in 2016  o Previously on metformin, stopped d/t kidney function decline . Current antihyperglycemic regimen: Trulicity 1.5 mg once weekly, Jardiance 25 mg daily  - added ~8/21 . Reports occasional dizziness (though reports sugars are high at these times), always sweats a lot  . Current meal patterns; does have food stamps; was in a homeless shelter last year; o Breakfast: Catfish nuggets; scrambled eggs, bacon; water  o Lunch: Piece of chicken;  o Supper: Chicken; sometimes fast food o Snacks: Occasional chips,  o Drinks: Water; occasional Sprite  . Current exercise: Notes "anxiety and depression" surrounding current events contributing to lack of motivation and exercise o Notes at this time last year, she was in a homeless shelter . Cardiovascular risk reduction: o Current hypertensive regimen: carvedilol 25 mg BID, amlodipine 10 mg daily, clonidine 0.2 mg TID, hydralazine 25 mg BID; see BP goal o Current hyperlipidemia regimen: lovastatin 20 mg daily; last LDL at goal <100, though targeting LDL <70 likely appropriate given personal and family risk; 10 year ASCVD risk 26% on lovastatin therapy   Pharmacist Clinical Goal(s):  Marland Kitchen Over the next 90 days, patient with work with PharmD and primary care provider to address optimized glycemic management  Interventions: Marland Kitchen Met with patient; she provided signature, income information, and copy of her prescription insurance card. Completed applications, submitted to [place] Clinical cytogeneticist) and [employer] (Trulicity).   Patient Self Care Activities:  . Patient will check blood glucose BID, document, and provide at future appointments . Patient will take medications as prescribed . Patient will report any questions or  concerns to provider   Please see past updates related to this goal by clicking on the "Past Updates" button in the selected goal         The patient verbalized understanding of instructions provided today and declined a print copy of patient instruction materials.  Plan:  - Will follow up with patient assistance programs in the next 1-2 weeks  [name], PharmD, [place] Pharmacist [place] [phone]

## 2018-09-07 ENCOUNTER — Ambulatory Visit: Payer: Self-pay | Admitting: Pharmacist

## 2018-09-07 ENCOUNTER — Other Ambulatory Visit: Payer: Self-pay | Admitting: Family Medicine

## 2018-09-07 ENCOUNTER — Telehealth: Payer: Self-pay

## 2018-09-07 DIAGNOSIS — B3731 Acute candidiasis of vulva and vagina: Secondary | ICD-10-CM

## 2018-09-07 DIAGNOSIS — E119 Type 2 diabetes mellitus without complications: Secondary | ICD-10-CM

## 2018-09-07 DIAGNOSIS — F419 Anxiety disorder, unspecified: Secondary | ICD-10-CM

## 2018-09-07 DIAGNOSIS — B373 Candidiasis of vulva and vagina: Secondary | ICD-10-CM

## 2018-09-07 MED ORDER — FLUCONAZOLE 150 MG PO TABS: 150.0000 mg | ORAL_TABLET | Freq: Once | ORAL | 0 refills | Status: AC

## 2018-09-07 NOTE — Telephone Encounter (Signed)
Copied from [place] [phone] [id number]. Topic: Referral - Status >> Sep 07, 2018  [id number] PM Simone Curia D wrote: [id number] Spoke with patient she did receive emailed resources for food banks and counseling resources. Patient also requested that I send a message to [name] [place] regarding a side effect from [place]. Ambrose Mantle [phone]

## 2018-09-07 NOTE — Chronic Care Management (AMB) (Signed)
Chronic Care Management   Follow Up Note   09/07/2018 Name: Nancy Barajas MRN: [id number] DOB: 06/25/1960  Referred by: Jodelle Green, FNP Reason for referral : Chronic Care Management (Medication Management)   Nancy Barajas is a 58 y.o. year old female who is a primary care patient of Guse, Jacquelynn Cree, FNP. The CCM team was consulted for assistance with chronic disease management and care coordination needs.    Received request to contact patient regarding potential side effect of Jardiance.   Review of patient status, including review of consultants reports, relevant laboratory and other test results, and collaboration with appropriate care team members and the patient's provider was performed as part of comprehensive patient evaluation and provision of chronic care management services.    SDOH (Social Determinants of Health) screening performed today: Financial Strain  Stress. See Care Plan for related entries.   Outpatient Encounter Medications as of 09/07/2018  Medication Sig  . amLODipine (NORVASC) 10 MG tablet Take 1 tablet (10 mg total) by mouth daily.  Marland Kitchen aspirin 81 MG chewable tablet Chew 81 mg by mouth daily.  . carvedilol (COREG) 25 MG tablet Take 1 tablet (25 mg total) by mouth 2 (two) times daily with a meal.  . Cholecalciferol (VITAMIN D-3) 125 MCG (5000 UT) TABS Take 1 tablet by mouth daily.  . cloNIDine (CATAPRES) 0.1 MG tablet Take 1 tablet (0.1 mg total) by mouth 2 (two) times daily. (Patient taking differently: Take 0.2 mg by mouth 2 (two) times daily. )  . Dulaglutide (TRULICITY) 1.5 0000000 SOPN Inject 1.5 mg into the skin every 7 (seven) days.  . empagliflozin (JARDIANCE) 25 MG TABS tablet Take 25 mg by mouth daily.  . fluconazole (DIFLUCAN) 150 MG tablet Take 1 tablet (150 mg total) by mouth once for 1 dose.  . hydrALAZINE (APRESOLINE) 25 MG tablet Take 1 tablet (25 mg total) by mouth 2 (two) times daily.  Marland Kitchen lovastatin (MEVACOR) 20 MG tablet Take 1 tablet (20  mg total) by mouth at bedtime.   No facility-administered encounter medications on file as of 09/07/2018.      Goals Addressed            This Visit's Progress     Patient Stated   . "I want to control my diabetes" (pt-stated)       Current Barriers:  . Diabetes: uncontrolled; most recent A1c 8.1% o When speaking with Connected Care guide Ambrose Mantle today, patient requested I call her to discuss a possible yeast infection that she has developed since starting Jardiance.  o Diagnosed in 2016  o Previously on metformin, stopped d/t kidney function decline . Current antihyperglycemic regimen: Trulicity 1.5 mg once weekly, Jardiance 25 mg daily  - added ~8/21 . Reports occasional dizziness (though reports sugars are high at these times), always sweats a lot  . Current meal patterns; does have food stamps; was in a homeless shelter last year; o Breakfast: Catfish nuggets; scrambled eggs, bacon; water  o Lunch: Piece of chicken;  o Supper: Chicken; sometimes fast food o Snacks: Occasional chips,  o Drinks: Water; occasional Sprite  . Current exercise: Notes "anxiety and depression" surrounding current events contributing to lack of motivation and exercise o Notes at this time last year, she was in a homeless shelter . Cardiovascular risk reduction: o Current hypertensive regimen: carvedilol 25 mg BID, amlodipine 10 mg daily, clonidine 0.2 mg TID, hydralazine 25 mg BID; see BP goal o Current hyperlipidemia regimen: lovastatin 20 mg daily; last  LDL at goal <100, though targeting LDL <70 likely appropriate given personal and family risk; 10 year ASCVD risk 26% on lovastatin therapy   Pharmacist Clinical Goal(s):  Marland Kitchen Over the next 90 days, patient with work with PharmD and primary care provider to address optimized glycemic management  Interventions: . Discussed with primary care provider Philis Nettle, NP. Lauren will treat yeast infection with fluconazole.  . Recommend patient HOLD  Jardiance until resolution of infection, then resume Jardiance at a lower dose. As patient has a supply of 25 mg at home, will recommend she split in half to take 12.5 mg once daily.   Patient Self Care Activities:  . Patient will check blood glucose BID, document, and provide at future appointments . Patient will take medications as prescribed . Patient will report any questions or concerns to provider   Please see past updates related to this goal by clicking on the "Past Updates" button in the selected goal        Plan: - Will outreach patient in 2-3 weeks to follow up on medication management concerns  [name], PharmD, [place] Pharmacist [place] [phone]

## 2018-09-07 NOTE — Telephone Encounter (Signed)
Copied from [place] [id number]. Topic: Referral - Status >> Sep 07, 2018 [id number] PM Simone Curia D wrote: [id number] unable to leave message on voicemail regarding resources for food banks and counseling. Voicemail did not pick up. Will attempt to call again next week. Ambrose Mantle [phone]

## 2018-09-07 NOTE — Patient Instructions (Signed)
Visit Information  Goals Addressed            This Visit's Progress     Patient Stated   . "I want to control my diabetes" (pt-stated)       Current Barriers:  . Diabetes: uncontrolled; most recent A1c 8.1% o When speaking with Connected Care guide Ambrose Mantle today, patient requested I call her to discuss a possible yeast infection that she has developed since starting Jardiance.  o She also notes that her speech therapist recommended she pursue a referral for counseling for her anxiety . Current antihyperglycemic regimen: Trulicity 1.5 mg once weekly, Jardiance 25 mg daily  - added ~8/21 o Diagnosed in 2016  o Previously on metformin, stopped d/t kidney function decline . Reports occasional dizziness (though reports sugars are high at these times), always sweats a lot  . Current meal patterns; does have food stamps; was in a homeless shelter last year; o Breakfast: Catfish nuggets; scrambled eggs, bacon; water  o Lunch: Piece of chicken;  o Supper: Chicken; sometimes fast food o Snacks: Occasional chips,  o Drinks: Water; occasional Sprite  . Current exercise: Notes "anxiety and depression" surrounding current events contributing to lack of motivation and exercise o Notes at this time last year, she was in a homeless shelter . Cardiovascular risk reduction: o Current hypertensive regimen: carvedilol 25 mg BID, amlodipine 10 mg daily, clonidine 0.2 mg TID, hydralazine 25 mg BID; see BP goal o Current hyperlipidemia regimen: lovastatin 20 mg daily; last LDL at goal <100, though targeting LDL <70 likely appropriate given personal and family risk; 10 year ASCVD risk 26% on lovastatin therapy   Pharmacist Clinical Goal(s):  Marland Kitchen Over the next 90 days, patient with work with PharmD and primary care provider to address optimized glycemic management  Interventions: . Discussed with primary care provider Philis Nettle, NP. Lauren will treat yeast infection with fluconazole.  . Recommend  patient HOLD Jardiance until resolution of infection, then resume Jardiance at a lower dose. As patient has a supply of 25 mg at home, will recommend she split in half to take 12.5 mg once daily.  Ander Purpura placing referral for mental health services.   Patient Self Care Activities:  . Patient will check blood glucose BID, document, and provide at future appointments . Patient will take medications as prescribed . Patient will report any questions or concerns to provider   Please see past updates related to this goal by clicking on the "Past Updates" button in the selected goal         The patient verbalized understanding of instructions provided today and declined a print copy of patient instruction materials.   Plan: - Will outreach patient in 2-3 weeks to follow up on medication management concerns  [name], PharmD, [place] Pharmacist [place] [phone]

## 2018-09-11 DIAGNOSIS — J385 Laryngeal spasm: Secondary | ICD-10-CM | POA: Diagnosis not present

## 2018-09-11 DIAGNOSIS — R490 Dysphonia: Secondary | ICD-10-CM | POA: Diagnosis not present

## 2018-09-11 DIAGNOSIS — Z87891 Personal history of nicotine dependence: Secondary | ICD-10-CM | POA: Diagnosis not present

## 2018-09-13 ENCOUNTER — Telehealth: Payer: Self-pay

## 2018-09-13 ENCOUNTER — Ambulatory Visit (INDEPENDENT_AMBULATORY_CARE_PROVIDER_SITE_OTHER): Payer: Medicare Other | Admitting: Pharmacist

## 2018-09-13 DIAGNOSIS — E1122 Type 2 diabetes mellitus with diabetic chronic kidney disease: Secondary | ICD-10-CM

## 2018-09-13 DIAGNOSIS — I10 Essential (primary) hypertension: Secondary | ICD-10-CM | POA: Diagnosis not present

## 2018-09-13 DIAGNOSIS — N183 Chronic kidney disease, stage 3 unspecified: Secondary | ICD-10-CM

## 2018-09-13 DIAGNOSIS — E119 Type 2 diabetes mellitus without complications: Secondary | ICD-10-CM

## 2018-09-13 NOTE — Patient Instructions (Signed)
Visit Information  Goals Addressed            This Visit's Progress     Patient Stated   . "I want to control my diabetes" (pt-stated)       Current Barriers:  . Diabetes: uncontrolled; most recent A1c 8.1% o Reports blood sugars have been overall improving  o Noted concerns affording Trulicity and Jardiance  o Notes that she has not heard back regarding Medicare Extra Help application yet o Notes her yeast infection symptoms haven't completed cleared up; she is still holding Jardiance . Current antihyperglycemic regimen: Trulicity 1.5 mg once weekly, Jardiance 25 mg daily  - added ~8/21 o Diagnosed in 2016  o Previously on metformin, stopped d/t kidney function decline . Reports occasional dizziness (though reports sugars are high at these times), always sweats a lot  . Current meal patterns; does have food stamps; was in a homeless shelter last year; o Breakfast: Catfish nuggets; scrambled eggs, bacon; water  o Lunch: Piece of chicken;  o Supper: Chicken; sometimes fast food o Snacks: Occasional chips,  o Drinks: Water; occasional Sprite  . Current exercise: Notes "anxiety and depression" surrounding current events contributing to lack of motivation and exercise o Notes at this time last year, she was in a homeless shelter . Cardiovascular risk reduction: o Current hypertensive regimen: carvedilol 25 mg BID, amlodipine 10 mg daily, clonidine 0.2 mg TID, hydralazine 25 mg BID; see BP goal o Current hyperlipidemia regimen: lovastatin 20 mg daily; last LDL at goal <100, though targeting LDL <70 likely appropriate given personal and family risk; 10 year ASCVD risk 26% on lovastatin therapy   Pharmacist Clinical Goal(s):  Marland Kitchen Over the next 90 days, patient with work with PharmD and primary care provider to address optimized glycemic management  Interventions: . Contacted [employer] - patient was APPROVED for Trulicity assistance through 01/03/2019. Order is ready for shipping;  provided patient with the phone number for [facility] for her to call and set up shipping. She will do this today.  . [place]- patient was temporarily APPROVED for a 90 day supply for Jardiance assistance, as she may qualify for Medicare Extra Help. 90 day supply of Jardiance should ship tomorrow, should arrive in 5-7 business days. Let patient know that if approved for Extra Help, she would not be able to receive further patient assistance help, but that copays for her brand name medications would be ~$9 for 90 day supplies; if denied for Extra Help, we will submit that denial to [facility] for further refills . Recommended she continued to hold Jardiance until complete resolution of itching/burning symptoms, and then restart with 1/2 of tablet (Jardiance 12.5 mg daily). She verbalized understanding  Patient Self Care Activities:  . Patient will check blood glucose BID, document, and provide at future appointments . Patient will take medications as prescribed . Patient will report any questions or concerns to provider   Please see past updates related to this goal by clicking on the "Past Updates" button in the selected goal      . "My BP is hard to control" (pt-stated)       Current Barriers:  . Uncontrolled hypertension, complicated by DM, CKD o Follows with [employer] . Current antihypertensive regimen: carvedilol 25 mg BID, amlodipine 10 mg QAM, clonidine 0.2 mg TID, hydralazine 25 mg BID  . Previous antihypertensives tried: no documented hx ACEi/ARB; spironolactone d/c d/t declining renal function in 02/2018;  . Current  home BP readings:  o Wide range over the weekend; 130-160s/80-100s; HR 60-80s  Pharmacist Clinical Goal(s):  Marland Kitchen Over the next 90 days, patient will work with PharmD and providers to optimize antihypertensive regimen  Interventions: . As we have a phone appointment scheduled next week for BP review, deferred recommending  any changes at this time d/t few readings. Encouraged patient to check BP daily over the next week, and check at different times of the day, noting time of day and relation to medication administration times. She verbalized understanding.  . Consider hydralazine dose increasing moving forward. CKD limits treatment choices   Patient Self Care Activities:  . Patient will continue to check BP daily, document, and provide at future appointments . Patient will focus on medication adherence   Please see past updates related to this goal by clicking on the "Past Updates" button in the selected goal         The patient verbalized understanding of instructions provided today and declined a print copy of patient instruction materials.   Plan:  - Will outreach patient next week as scheduled for medication management review  [name], PharmD, [place] Pharmacist [place] [phone]

## 2018-09-13 NOTE — Telephone Encounter (Signed)
Copied from [place] [id number]. Topic: Referral - Status >> Sep 13, 2018 [id number] PM Simone Curia D wrote: [id number] Spoke with patient about food and counseling resources. She plans on contacting resources this week.  She does not have any other needs but will contact me if she does. Ambrose Mantle [phone]

## 2018-09-13 NOTE — Chronic Care Management (AMB) (Signed)
Chronic Care Management   Follow Up Note   09/13/2018 Name: Nancy Barajas MRN: [id number] DOB: 1960/03/15  Referred by: Jodelle Green, FNP Reason for referral : Chronic Care Management (Medication Management)   Nancy Barajas is a 58 y.o. year old female who is a primary care patient of Guse, Jacquelynn Cree, FNP. The CCM team was consulted for assistance with chronic disease management and care coordination needs.    Care coordination completed today.   Review of patient status, including review of consultants reports, relevant laboratory and other test results, and collaboration with appropriate care team members and the patient's provider was performed as part of comprehensive patient evaluation and provision of chronic care management services.    SDOH (Social Determinants of Health) screening performed today: Financial Strain . See Care Plan for related entries.   Outpatient Encounter Medications as of 09/13/2018  Medication Sig Note  . amLODipine (NORVASC) 10 MG tablet Take 1 tablet (10 mg total) by mouth daily. 09/13/2018: QAM  . carvedilol (COREG) 25 MG tablet Take 1 tablet (25 mg total) by mouth 2 (two) times daily with a meal.   . cloNIDine (CATAPRES) 0.1 MG tablet Take 1 tablet (0.1 mg total) by mouth 2 (two) times daily. (Patient taking differently: Take 0.2 mg by mouth 2 (two) times daily. )   . Dulaglutide (TRULICITY) 1.5 0000000 SOPN Inject 1.5 mg into the skin every 7 (seven) days.   . hydrALAZINE (APRESOLINE) 25 MG tablet Take 1 tablet (25 mg total) by mouth 2 (two) times daily.   Marland Kitchen lovastatin (MEVACOR) 20 MG tablet Take 1 tablet (20 mg total) by mouth at bedtime.   Marland Kitchen aspirin 81 MG chewable tablet Chew 81 mg by mouth daily.   . Cholecalciferol (VITAMIN D-3) 125 MCG (5000 UT) TABS Take 1 tablet by mouth daily.   . empagliflozin (JARDIANCE) 25 MG TABS tablet Take 25 mg by mouth daily. (Patient not taking: Reported on 09/13/2018)    No facility-administered encounter  medications on file as of 09/13/2018.      Goals Addressed            This Visit's Progress     Patient Stated   . "I want to control my diabetes" (pt-stated)       Current Barriers:  . Diabetes: uncontrolled; most recent A1c 8.1% o Reports blood sugars have been overall improving  o Noted concerns affording Trulicity and Jardiance  o Notes that she has not heard back regarding Medicare Extra Help application yet o Notes her yeast infection symptoms haven't completed cleared up; she is still holding Jardiance . Current antihyperglycemic regimen: Trulicity 1.5 mg once weekly, Jardiance 25 mg daily  - added ~8/21 o Diagnosed in 2016  o Previously on metformin, stopped d/t kidney function decline . Reports occasional dizziness (though reports sugars are high at these times), always sweats a lot  . Current meal patterns; does have food stamps; was in a homeless shelter last year; o Breakfast: Catfish nuggets; scrambled eggs, bacon; water  o Lunch: Piece of chicken;  o Supper: Chicken; sometimes fast food o Snacks: Occasional chips,  o Drinks: Water; occasional Sprite  . Current exercise: Notes "anxiety and depression" surrounding current events contributing to lack of motivation and exercise o Notes at this time last year, she was in a homeless shelter . Cardiovascular risk reduction: o Current hypertensive regimen: carvedilol 25 mg BID, amlodipine 10 mg daily, clonidine 0.2 mg TID, hydralazine 25 mg BID; see BP goal o Current  hyperlipidemia regimen: lovastatin 20 mg daily; last LDL at goal <100, though targeting LDL <70 likely appropriate given personal and family risk; 10 year ASCVD risk 26% on lovastatin therapy   Pharmacist Clinical Goal(s):  Marland Kitchen Over the next 90 days, patient with work with PharmD and primary care provider to address optimized glycemic management  Interventions: . Contacted [employer] - patient was APPROVED for Trulicity assistance through 01/03/2019. Order is  ready for shipping; provided patient with the phone number for [facility] for her to call and set up shipping. She will do this today.  . [place]- patient was temporarily APPROVED for a 90 day supply for Jardiance assistance, as she may qualify for Medicare Extra Help. 90 day supply of Jardiance should ship tomorrow, should arrive in 5-7 business days. Let patient know that if approved for Extra Help, she would not be able to receive further patient assistance help, but that copays for her brand name medications would be ~$9 for 90 day supplies; if denied for Extra Help, we will submit that denial to [facility] for further refills . Recommended she continued to hold Jardiance until complete resolution of itching/burning symptoms, and then restart with 1/2 of tablet (Jardiance 12.5 mg daily). She verbalized understanding  Patient Self Care Activities:  . Patient will check blood glucose BID, document, and provide at future appointments . Patient will take medications as prescribed . Patient will report any questions or concerns to provider   Please see past updates related to this goal by clicking on the "Past Updates" button in the selected goal      . "My BP is hard to control" (pt-stated)       Current Barriers:  . Uncontrolled hypertension, complicated by DM, CKD o Follows with [facility] . Current antihypertensive regimen: carvedilol 25 mg BID, amlodipine 10 mg QAM, clonidine 0.2 mg TID, hydralazine 25 mg BID  . Previous antihypertensives tried: no documented hx ACEi/ARB; spironolactone d/c d/t declining renal function in 02/2018;  . Current home BP readings:  o Wide range over the weekend; 130-160s/80-100s; HR 60-80s  Pharmacist Clinical Goal(s):  Marland Kitchen Over the next 90 days, patient will work with PharmD and providers to optimize antihypertensive regimen  Interventions: . As we have a phone appointment scheduled next week for BP review,  deferred recommending any changes at this time d/t few readings. Encouraged patient to check BP daily over the next week, and check at different times of the day, noting time of day and relation to medication administration times. She verbalized understanding.  . Consider hydralazine dose increasing moving forward. CKD limits treatment choices   Patient Self Care Activities:  . Patient will continue to check BP daily, document, and provide at future appointments . Patient will focus on medication adherence   Please see past updates related to this goal by clicking on the "Past Updates" button in the selected goal         Plan:  - Will outreach patient next week as scheduled for medication management review  [name], PharmD, [place] Pharmacist [place] [phone]

## 2018-09-18 DIAGNOSIS — J385 Laryngeal spasm: Secondary | ICD-10-CM | POA: Diagnosis not present

## 2018-09-18 DIAGNOSIS — Z87891 Personal history of nicotine dependence: Secondary | ICD-10-CM | POA: Diagnosis not present

## 2018-09-18 DIAGNOSIS — R490 Dysphonia: Secondary | ICD-10-CM | POA: Diagnosis not present

## 2018-09-20 ENCOUNTER — Ambulatory Visit: Payer: Medicare Other | Admitting: Pharmacist

## 2018-09-20 DIAGNOSIS — I10 Essential (primary) hypertension: Secondary | ICD-10-CM | POA: Diagnosis not present

## 2018-09-20 DIAGNOSIS — E1122 Type 2 diabetes mellitus with diabetic chronic kidney disease: Secondary | ICD-10-CM

## 2018-09-20 DIAGNOSIS — E119 Type 2 diabetes mellitus without complications: Secondary | ICD-10-CM

## 2018-09-20 DIAGNOSIS — E782 Mixed hyperlipidemia: Secondary | ICD-10-CM

## 2018-09-20 DIAGNOSIS — N183 Chronic kidney disease, stage 3 unspecified: Secondary | ICD-10-CM

## 2018-09-20 MED ORDER — HYDRALAZINE HCL 50 MG PO TABS: 50.0000 mg | ORAL_TABLET | Freq: Two times a day (BID) | ORAL | 1 refills | Status: DC

## 2018-09-20 MED ORDER — ROSUVASTATIN CALCIUM 10 MG PO TABS: 10.0000 mg | ORAL_TABLET | Freq: Every day | ORAL | 3 refills | Status: DC

## 2018-09-20 NOTE — Addendum Note (Signed)
Addended by: Philis Nettle on: 09/20/2018 09:49 AM   Modules accepted: Orders

## 2018-09-20 NOTE — Chronic Care Management (AMB) (Signed)
Chronic Care Management   Follow Up Note   09/20/2018 Name: Nancy Barajas MRN: [id number] DOB: 01-20-1960  Referred by: Jodelle Green, FNP Reason for referral : Chronic Care Management (Medication Management)   Nancy Barajas is a 58 y.o. year old female who is a primary care patient of Guse, Jacquelynn Cree, FNP. The CCM team was consulted for assistance with chronic disease management and care coordination needs.    Contacted patent telephonically for medication management review.   Review of patient status, including review of consultants reports, relevant laboratory and other test results, and collaboration with appropriate care team members and the patient's provider was performed as part of comprehensive patient evaluation and provision of chronic care management services.    SDOH (Social Determinants of Health) screening performed today: Financial Strain  Stress. See Care Plan for related entries.   Advanced Directives Status: N See Care Plan and [employer] application for related entries.  Outpatient Encounter Medications as of 09/20/2018  Medication Sig Note  . amLODipine (NORVASC) 10 MG tablet Take 1 tablet (10 mg total) by mouth daily. 09/13/2018: QAM  . aspirin 81 MG chewable tablet Chew 81 mg by mouth daily.   . carvedilol (COREG) 25 MG tablet Take 1 tablet (25 mg total) by mouth 2 (two) times daily with a meal.   . Cholecalciferol (VITAMIN D-3) 125 MCG (5000 UT) TABS Take 1 tablet by mouth daily.   . cloNIDine (CATAPRES) 0.1 MG tablet Take 1 tablet (0.1 mg total) by mouth 2 (two) times daily. (Patient taking differently: Take 0.2 mg by mouth 2 (two) times daily. )   . Dulaglutide (TRULICITY) 1.5 VY/7.2JL SOPN Inject 1.5 mg into the skin every 7 (seven) days.   . empagliflozin (JARDIANCE) 25 MG TABS tablet Take 25 mg by mouth daily. 09/20/2018: Taking 1/2 tab daily   . hydrALAZINE (APRESOLINE) 25 MG tablet Take 1 tablet (25 mg total) by mouth 2 (two) times daily.   Marland Kitchen lovastatin  (MEVACOR) 20 MG tablet Take 1 tablet (20 mg total) by mouth at bedtime.    No facility-administered encounter medications on file as of 09/20/2018.      Goals Addressed            This Visit's Progress     Patient Stated   . "I want to control my diabetes" (pt-stated)       Current Barriers:  . Diabetes: uncontrolled; most recent A1c 8.1% o Reports she has not yet received Trulicity from [employer], but did call and set up shipping. She still has 2 pens at home o S/p yeast infection; reinitiated 1/2 tab of Jardiance 25 mg (12.5 mg) this past 'Sunday . Current antihyperglycemic regimen: Trulicity 1.5 mg once weekly, Jardiance 12.5 mg daily o Diagnosed in 2016  o Previously on metformin, stopped d/t kidney function decline . Reports occasional dizziness (though reports sugars are high at these times), always sweats a lot  . Current glucose readings:   Fasting After Breakfast After Lunch After Dinner Evening  5-Sep 120    155  6-Sep 129      7-Sep 130      8-Sep 158      10-Sep 137    259  11-Sep 135  170 188   12-Sep 131    178  13-Sep 130      14-Sep 145      15-Sep 131      16' -Sep 130 141  157   17-Sep 143  Average 135 141 170 172 197   . Current meal patterns; does have food stamps; was in a homeless shelter last year; o Breakfast: Catfish nuggets; scrambled eggs, bacon; water  o Lunch: Piece of chicken;  o Supper: Chicken; sometimes fast food o Snacks: Occasional chips,  o Drinks: Water; occasional Sprite  . Current exercise: Notes "anxiety and depression" surrounding current events contributing to lack of motivation and exercise o Has an appointment scheduled w/ LCSW 10/25/2018 . Cardiovascular risk reduction: o Current hypertensive regimen: carvedilol 25 mg BID, amlodipine 10 mg daily, clonidine 0.2 mg TID, hydralazine 25 mg BID; see BP goal o Current hyperlipidemia regimen: lovastatin 20 mg daily; last LDL not at goal <70 (given comorbidities of CKD, DM, HTN);  10 year ASCVD risk 26% on lovastatin therapy; patient denies having tried alternative statin therapy in the past   Pharmacist Clinical Goal(s):  Marland Kitchen Over the next 90 days, patient with work with PharmD and primary care provider to address optimized glycemic management  Interventions: . Recommended to continued Jardiance 12.5 mg daily for a few more weeks to determine tolerability. If no genitourinary side effects, will plan to maximize to 25 mg daily . Recommended patient contact me if she hasn't received Trulicity 1.5 mg from [employer] by next week . Recommend high intensity statin given risk factors and 10 year ASCVD risk. Discussed this recommendation with patient; she is amenable to this. Recommend d/c lovastatin and start rosuvastatin 10 mg daily. Will discuss with Philis Nettle, NP.   Patient Self Care Activities:  . Patient will check blood glucose BID, document, and provide at future appointments . Patient will take medications as prescribed . Patient will report any questions or concerns to provider   Please see past updates related to this goal by clicking on the "Past Updates" button in the selected goal      . "My BP is hard to control" (pt-stated)       Current Barriers:  . Uncontrolled hypertension, complicated by DM, CKD o Follows with [facility]; notes she has an appointment in ~2 months w/ Dr. Holley Raring o Notes that at the last appointment, she understood him to say that he wanted to monitor BP in the next 3 months before deciding next steps for antihypertensive therapy o Does endorse some dizziness during the day . Current antihypertensive regimen: carvedilol 25 mg BID, amlodipine 10 mg QAM, clonidine 0.2 mg TID, hydralazine 25 mg BID  . Previous antihypertensives tried: no documented hx ACEi/ARB, patient does recognize the name lisinopril; spironolactone d/c d/t declining renal function in 02/2018;  . Last eGFR ~52 . Current home BP readings:   SBP DBP HR  5-Sep  168 107 87  6-Sep 150 94 87  7-Sep 137 100 88  8-Sep     10-Sep 142 91 83  11-Sep 136 99 81  12-Sep 136 96 79  13-Sep 137 94 83  14-Sep     15-Sep 140 85 72  16-Sep 148 97 79  17-Sep 141 86 83  Average 143 95 82.2    Pharmacist Clinical Goal(s):  Marland Kitchen Over the next 90 days, patient will work with PharmD and providers to optimize antihypertensive regimen  Interventions: . Reviewed BP log with patient . Per review of our records, I do not see any contraindications for ACEi/ARB therapy. Would recommend consideration of this, though could also wait and have patient discuss with nephrology at next appointment in ~2 months . Will discuss with Philis Nettle, NP.  If intervention wanted at this time, recommend increasing hydralazine to 50 mg BID. Can always be down titrated moving forward if ACE/ARB added.  . Consider evaluating for orthostatic hypotension moving forward d/t dizziness. Ideally, would prefer to use alternative antihypertensives to remove need for clonidine therapy to avoid CNS side effects.   Patient Self Care Activities:  . Patient will continue to check BP daily, document, and provide at future appointments . Patient will focus on medication adherence   Please see past updates related to this goal by clicking on the "Past Updates" button in the selected goal         Plan:  - Will collaborate with Philis Nettle, NP on the above - Scheduled telephonic follow up with patient in 4 weeks  [name], PharmD, [place] Pharmacist [place] [phone]

## 2018-09-20 NOTE — Progress Notes (Signed)
Agree with recommendations  Will D/c lovastatin and start crestor  Will increase hydralazine to 50mg  BID  Philis Nettle FNP

## 2018-09-20 NOTE — Patient Instructions (Signed)
Visit Information  Goals Addressed            This Visit's Progress     Patient Stated   . "I want to control my diabetes" (pt-stated)       Current Barriers:  . Diabetes: uncontrolled; most recent A1c 8.1% o Reports she has not yet received Trulicity from [employer], but did call and set up shipping. She still has 2 pens at home o S/p yeast infection; reinitiated 1/2 tab of Jardiance 25 mg (12.5 mg) this past _0 -Sep 130 141  157   17-Sep 143      Average 135 141 170 172 197   . Current meal patterns; does have food stamps; was in a homeless shelter last year; o Breakfast: Catfish nuggets; scrambled eggs, bacon; water  o Lunch: Piece of chicken;  o Supper: Chicken; sometimes fast food o Snacks: Occasional chips,  o Drinks: Water; occasional Sprite  . Current exercise: Notes "anxiety and depression" surrounding current events contributing to lack of motivation and exercise o Has an appointment scheduled w/ LCSW 10/25/2018 . Cardiovascular risk reduction: o Current hypertensive regimen: carvedilol 25 mg BID, amlodipine 10 mg daily, clonidine 0.2 mg TID, hydralazine 25 mg BID; see BP goal o Current hyperlipidemia regimen: lovastatin 20 mg daily; last LDL not at goal <70 (given comorbidities of CKD, DM, HTN); 10 year ASCVD risk 26% on lovastatin therapy; patient denies having tried alternative statin therapy in the past   Pharmacist Clinical  Goal(s):  Marland Kitchen Over the next 90 days, patient with work with PharmD and primary care provider to address optimized glycemic management  Interventions: . Recommended to continued Jardiance 12.5 mg daily for a few more weeks to determine tolerability. If no genitourinary side effects, will plan to maximize to 25 mg daily . Recommended patient contact me if she hasn't received Trulicity 1.5 mg from [employer] by next week . Recommend high intensity statin given risk factors and 10 year ASCVD risk. Discussed this recommendation with patient; she is amenable to this. Recommend d/c lovastatin and start rosuvastatin 10 mg daily. Will discuss with Philis Nettle, NP.   Patient Self Care Activities:  . Patient will check blood glucose BID, document, and provide at future appointments . Patient will take medications as prescribed . Patient will report any questions or concerns to provider   Please see past updates related to this goal by clicking on the "Past Updates" button in the selected goal      . "My BP is hard to control" (pt-stated)       Current Barriers:  . Uncontrolled hypertension, complicated by DM, CKD o Follows with [facility]; notes she has an appointment in ~2 months w/ Dr. Holley Raring o Notes  that at the last appointment, she understood him to say that he wanted to monitor BP in the next 3 months before deciding next steps for antihypertensive therapy o Does endorse some dizziness during the day . Current antihypertensive regimen: carvedilol 25 mg BID, amlodipine 10 mg QAM, clonidine 0.2 mg TID, hydralazine 25 mg BID  . Previous antihypertensives tried: no documented hx ACEi/ARB, patient does recognize the name lisinopril; spironolactone d/c d/t declining renal function in 02/2018;  . Last eGFR ~52 . Current home BP readings:   SBP DBP HR  5-Sep 168 107 87  6-Sep 150 94 87  7-Sep 137 100 88  8-Sep     10-Sep 142 91 83  11-Sep 136 99 81  12-Sep 136 96 79  13-Sep 137 94 83   14-Sep     15-Sep 140 85 72  16-Sep 148 97 79  17-Sep 141 86 83  Average 143 95 82.2    Pharmacist Clinical Goal(s):  Marland Kitchen Over the next 90 days, patient will work with PharmD and providers to optimize antihypertensive regimen  Interventions: . Reviewed BP log with patient . Per review of our records, I do not see any contraindications for ACEi/ARB therapy. Would recommend consideration of this, though could also wait and have patient discuss with nephrology at next appointment in ~2 months . Will discuss with Philis Nettle, NP. If intervention wanted at this time, recommend increasing hydralazine to 50 mg BID. Can always be down titrated moving forward if ACE/ARB added.  . Consider evaluating for orthostatic hypotension moving forward d/t dizziness. Ideally, would prefer to use alternative antihypertensives to remove need for clonidine therapy to avoid CNS side effects.   Patient Self Care Activities:  . Patient will continue to check BP daily, document, and provide at future appointments . Patient will focus on medication adherence   Please see past updates related to this goal by clicking on the "Past Updates" button in the selected goal         The patient verbalized understanding of instructions provided today and declined a print copy of patient instruction materials.   Plan:  - Will collaborate with Philis Nettle, NP on the above - Scheduled telephonic follow up with patient in 4 weeks  [name], PharmD, [place] Pharmacist [place] [phone]

## 2018-09-25 DIAGNOSIS — J385 Laryngeal spasm: Secondary | ICD-10-CM | POA: Diagnosis not present

## 2018-09-25 DIAGNOSIS — R490 Dysphonia: Secondary | ICD-10-CM | POA: Diagnosis not present

## 2018-09-25 DIAGNOSIS — Z87891 Personal history of nicotine dependence: Secondary | ICD-10-CM | POA: Diagnosis not present

## 2018-10-18 ENCOUNTER — Ambulatory Visit: Payer: Medicare Other | Admitting: Pharmacist

## 2018-10-18 DIAGNOSIS — E119 Type 2 diabetes mellitus without complications: Secondary | ICD-10-CM

## 2018-10-18 NOTE — Chronic Care Management (AMB) (Signed)
Chronic Care Management   Follow Up Note   10/18/2018 Name: Nancy Barajas MRN: [id number] DOB: 1960-04-26  Referred by: Nancy Green, FNP Reason for referral : Chronic Care Management (Medication Management)   Nancy Barajas is a 58 y.o. year old female who is a primary care patient of Guse, Nancy Cree, FNP. The CCM team was consulted for assistance with chronic disease management and care coordination needs.    Contacted patient telephonically for medication management review.   Review of patient status, including review of consultants reports, relevant laboratory and other test results, and collaboration with appropriate care team members and the patient's provider was performed as part of comprehensive patient evaluation and provision of chronic care management services.    SDOH (Social Determinants of Health) screening performed today: Stress. See Care Plan for related entries.   Advanced Directives Status: N See Care Plan and Vynca application for related entries.  Outpatient Encounter Medications as of 10/18/2018  Medication Sig Note  . amLODipine (NORVASC) 10 MG tablet Take 1 tablet (10 mg total) by mouth daily. 09/13/2018: QAM  . aspirin 81 MG chewable tablet Chew 81 mg by mouth daily.   . carvedilol (COREG) 25 MG tablet Take 1 tablet (25 mg total) by mouth 2 (two) times daily with a meal.   . Cholecalciferol (VITAMIN D-3) 125 MCG (5000 UT) TABS Take 1 tablet by mouth daily.   . cloNIDine (CATAPRES) 0.1 MG tablet Take 1 tablet (0.1 mg total) by mouth 2 (two) times daily. (Patient taking differently: Take 0.2 mg by mouth 2 (two) times daily. )   . Dulaglutide (TRULICITY) 1.5 ZG/0.1VC SOPN Inject 1.5 mg into the skin every 7 (seven) days.   . hydrALAZINE (APRESOLINE) 50 MG tablet Take 1 tablet (50 mg total) by mouth 2 (two) times daily.   . rosuvastatin (CRESTOR) 10 MG tablet Take 1 tablet (10 mg total) by mouth daily.   . [DISCONTINUED] empagliflozin (JARDIANCE) 25 MG TABS  tablet Take 25 mg by mouth daily. 09/20/2018: Taking 1/2 tab daily    No facility-administered encounter medications on file as of 10/18/2018.      Goals Addressed            This Visit's Progress     Patient Stated   . "I want to control my diabetes" (pt-stated)       Current Barriers:  . Diabetes: uncontrolled; most recent A1c 8.1% o Notes she received notice of approval for Medicare Extra Help - she will have a $0 premium, $0 deductible, all generic prescriptions will be $3.65/90 day supply and all brand $8.95/90 day supply  . Current antihyperglycemic regimen: Trulicity 1.5 mg once weekly o Diagnosed in 2016  o Previously on metformin, stopped d/t kidney function decline o Stopped Jardiance d/t yeast infection coming back, even on 12.5 mg daily  . Current glucose readings:   Fasting Post Prandial  1-Oct 146   2-Oct 144   3-Oct 144   4-Oct  170  5-Oct 120   6-Oct 116   7-Oct 107 165  8-Oct 143   9-Oct 147   10-Oct 131   11-Oct 139   12-Oct 119   13-Oct 170   14-Oct 148 189  15-Oct 146   Average 137 175   . Current meal patterns; does have food stamps . Current exercise: Notes "anxiety and depression" surrounding current events contributing to lack of motivation and exercise o Has an appointment scheduled w/ LCSW 10/25/2018 . Cardiovascular risk reduction: o Current  hypertensive regimen: carvedilol 25 mg BID, amlodipine 10 mg daily, clonidine 0.2 mg TID, hydralazine 25 mg BID; see BP goal o Current hyperlipidemia regimen: lovastatin 20 mg daily; last LDL not at goal <70; patient notes that she was unable to fill rosuvastatin as it was >$400  Pharmacist Clinical Goal(s):  Marland Kitchen Over the next 90 days, patient with work with PharmD and primary care provider to address optimized glycemic management  Interventions: . Agree with patient to d/c Jardiance d/t recurrent yeast infections. Pending upcoming A1c, would recommend increasing Trulicity dose (just approved for 3 mg and  4.5 mg dosing pens, pens not available at this time). After upcoming appointment w/ PCP, we can send in an updated prescription to [facility] patient assistance for Trulicity 3 mg pen if needed.  . Patient notes that there seems to be variability with her glucometer if she does repeat checks. She said she purchased a "cheap" [facility] brand. Recommend sending a generic glucometer prescription to [facility] to be filled with her insurance for whichever brand of meter is covered; will collaborate with Philis Nettle, NP on that  . Congratulated patient on improvement in blood pressure. Recommend continuing current regimen; re-evaluate at upcoming appointment. If BP lowering is needed, would consider re-initiation of ACEi/ARB. Patient has f/u with nephrology after her appointment w/ PCP . Contacted [facility]. They do not have her prescription insurance card on file. Recommended that patient take her insurance card to [facility] so that they can fill rosuvastatin with insurance.  . Patient will bring copy of [place] Medicare Extra Help approval to clinic for me when she is in for her PCP visit.   Patient Self Care Activities:  . Patient will check blood glucose BID, document, and provide at future appointments . Patient will take medications as prescribed . Patient will report any questions or concerns to provider   Please see past updates related to this goal by clicking on the "Past Updates" button in the selected goal      . "My BP is hard to control" (pt-stated)       Current Barriers:  . Uncontrolled hypertension, complicated by DM, CKD o Follows with [facility]; appointment next month . Current antihypertensive regimen: carvedilol 25 mg BID, amlodipine 10 mg QAM, clonidine 0.2 mg TID, hydralazine 50 mg BID  . Previous antihypertensives tried: no documented hx ACEi/ARB, patient does recognize the name lisinopril; spironolactone d/c d/t declining renal function in 02/2018;  . Last eGFR ~52 . Current  home BP readings:   AM SBP DBP HR Afternoon SBP DBP HR PM SBP DBP  HR  1-Oct 136 91 81        2-Oct 128 86 86        3-Oct           4-Oct    154 95 93     5-Oct       125 89 90  6-Oct 144 91 79        7-Oct 135 103 91 125 77 94     8-Oct 125 82 83        9-Oct           10-Oct 138 96 85        11-Oct           12-Oct           13-Oct 118 86 97        14-Oct    132 82 97  15-Oct 139 92 97        Average 133 91 87 137 85 95 125 89 90    Pharmacist Clinical Goal(s):  Marland Kitchen Over the next 90 days, patient will work with PharmD and providers to optimize antihypertensive regimen  Interventions: . Reviewed BP log with patient. Recommend continuing current dose; re-evaluating at follow up with Philis Nettle, NP in ~3 weeks.  . Per review of our records, I do not see any contraindications for ACEi/ARB therapy. Would recommend consideration of this, though could also wait and have patient discuss with nephrology at appointment on 11/6  Patient Self Care Activities:  . Patient will continue to check BP daily, document, and provide at future appointments . Patient will focus on medication adherence   Please see past updates related to this goal by clicking on the "Past Updates" button in the selected goal          Plan:  - Will collaborate with PCP Philis Nettle, NP as above - Will outreach patient in ~4-5 weeks for continued medication management support  [name], PharmD, [place] Pharmacist [place] [phone]

## 2018-10-18 NOTE — Patient Instructions (Signed)
Visit Information  Goals Addressed            This Visit's Progress     Patient Stated   . "I want to control my diabetes" (pt-stated)       Current Barriers:  . Diabetes: uncontrolled; most recent A1c 8.1% o Notes she received notice of approval for Medicare Extra Help - she will have a $0 premium, $0 deductible, all generic prescriptions will be $3.65/90 day supply and all brand $8.95/90 day supply  . Current antihyperglycemic regimen: Trulicity 1.5 mg once weekly o Diagnosed in 2016  o Previously on metformin, stopped d/t kidney function decline o Stopped Jardiance d/t yeast infection coming back, even on 12.5 mg daily  . Current glucose readings:   Fasting Post Prandial  1-Oct 146   2-Oct 144   3-Oct 144   4-Oct  170  5-Oct 120   6-Oct 116   7-Oct 107 165  8-Oct 143   9-Oct 147   10-Oct 131   11-Oct 139   12-Oct 119   13-Oct 170   14-Oct 148 189  15-Oct 146   Average 137 175   . Current meal patterns; does have food stamps . Current exercise: Notes "anxiety and depression" surrounding current events contributing to lack of motivation and exercise o Has an appointment scheduled w/ LCSW 10/25/2018 . Cardiovascular risk reduction: o Current hypertensive regimen: carvedilol 25 mg BID, amlodipine 10 mg daily, clonidine 0.2 mg TID, hydralazine 25 mg BID; see BP goal o Current hyperlipidemia regimen: lovastatin 20 mg daily; last LDL not at goal <70; patient notes that she was unable to fill rosuvastatin as it was >$400  Pharmacist Clinical Goal(s):  Marland Kitchen Over the next 90 days, patient with work with PharmD and primary care provider to address optimized glycemic management  Interventions: . Agree with patient to d/c Jardiance d/t recurrent yeast infections. Pending upcoming A1c, would recommend increasing Trulicity dose (just approved for 3 mg and 4.5 mg dosing pens, pens not available at this time). After upcoming appointment w/ PCP, we can send in an updated prescription to  [facility] patient assistance for Trulicity 3 mg pen if needed.  . Patient notes that there seems to be variability with her glucometer if she does repeat checks. She said she purchased a "cheap" [facility] brand. Recommend sending a generic glucometer prescription to [facility] to be filled with her insurance for whichever brand of meter is covered; will collaborate with Philis Nettle, NP on that  . Congratulated patient on improvement in blood pressure. Recommend continuing current regimen; re-evaluate at upcoming appointment. If BP lowering is needed, would consider re-initiation of ACEi/ARB. Patient has f/u with nephrology after her appointment w/ PCP . Contacted [facility]. They do not have her prescription insurance card on file. Recommended that patient take her insurance card to [facility] so that they can fill rosuvastatin with insurance.  . Patient will bring copy of [employer] Medicare Extra Help approval to clinic for me when she is in for her PCP visit.   Patient Self Care Activities:  . Patient will check blood glucose BID, document, and provide at future appointments . Patient will take medications as prescribed . Patient will report any questions or concerns to provider   Please see past updates related to this goal by clicking on the "Past Updates" button in the selected goal      . "My BP is hard to control" (pt-stated)       Current Barriers:  . Uncontrolled  hypertension, complicated by DM, CKD o Follows with [facility]; appointment next month . Current antihypertensive regimen: carvedilol 25 mg BID, amlodipine 10 mg QAM, clonidine 0.2 mg TID, hydralazine 50 mg BID  . Previous antihypertensives tried: no documented hx ACEi/ARB, patient does recognize the name lisinopril; spironolactone d/c d/t declining renal function in 02/2018;  . Last eGFR ~52 . Current home BP readings:   AM SBP DBP HR Afternoon SBP DBP HR PM SBP DBP  HR  1-Oct 136 91 81        2-Oct 128 86 86        3-Oct            4-Oct    154 95 93     5-Oct       125 89 90  6-Oct 144 91 79        7-Oct 135 103 91 125 77 94     8-Oct 125 82 83        9-Oct           10-Oct 138 96 85        11-Oct           12-Oct           13-Oct 118 86 97        14-Oct    132 82 97     15-Oct 139 92 97        Average 133 91 87 137 85 95 125 89 90    Pharmacist Clinical Goal(s):  Marland Kitchen Over the next 90 days, patient will work with PharmD and providers to optimize antihypertensive regimen  Interventions: . Reviewed BP log with patient. Recommend continuing current dose; re-evaluating at follow up with Philis Nettle, NP in ~3 weeks.  . Per review of our records, I do not see any contraindications for ACEi/ARB therapy. Would recommend consideration of this, though could also wait and have patient discuss with nephrology at appointment on 11/6  Patient Self Care Activities:  . Patient will continue to check BP daily, document, and provide at future appointments . Patient will focus on medication adherence   Please see past updates related to this goal by clicking on the "Past Updates" button in the selected goal         The patient verbalized understanding of instructions provided today and declined a print copy of patient instruction materials.   Plan:  - Will collaborate with PCP Philis Nettle, NP as above - Will outreach patient in ~4-5 weeks for continued medication management support  [name], PharmD, [place] Pharmacist [place] [phone]

## 2018-10-22 MED ORDER — BLOOD GLUCOSE METER KIT: PACK | 0 refills | Status: DC

## 2018-10-22 NOTE — Progress Notes (Signed)
Reviewed notes  Will send in Rx for glucometer   And Will do increased Trulicity dose (pending next A1c) as recommended  LG

## 2018-10-22 NOTE — Addendum Note (Signed)
Addended by: Philis Nettle on: 10/22/2018 01:15 PM   Modules accepted: Orders

## 2018-10-25 ENCOUNTER — Ambulatory Visit (INDEPENDENT_AMBULATORY_CARE_PROVIDER_SITE_OTHER): Payer: Medicare Other | Admitting: Psychology

## 2018-10-25 DIAGNOSIS — F418 Other specified anxiety disorders: Secondary | ICD-10-CM

## 2018-10-31 ENCOUNTER — Ambulatory Visit (INDEPENDENT_AMBULATORY_CARE_PROVIDER_SITE_OTHER): Payer: Medicare Other | Admitting: Psychology

## 2018-10-31 DIAGNOSIS — F418 Other specified anxiety disorders: Secondary | ICD-10-CM

## 2018-11-02 ENCOUNTER — Ambulatory Visit: Payer: Medicare Other

## 2018-11-05 ENCOUNTER — Other Ambulatory Visit: Payer: Self-pay

## 2018-11-07 ENCOUNTER — Other Ambulatory Visit: Payer: Self-pay

## 2018-11-07 ENCOUNTER — Encounter: Payer: Self-pay | Admitting: Family Medicine

## 2018-11-07 ENCOUNTER — Ambulatory Visit (INDEPENDENT_AMBULATORY_CARE_PROVIDER_SITE_OTHER): Payer: Medicare Other | Admitting: Family Medicine

## 2018-11-07 VITALS — BP 148/96 | HR 86 | Temp 96.7°F | Ht 68.0 in | Wt 247.8 lb

## 2018-11-07 DIAGNOSIS — E119 Type 2 diabetes mellitus without complications: Secondary | ICD-10-CM

## 2018-11-07 DIAGNOSIS — I10 Essential (primary) hypertension: Secondary | ICD-10-CM | POA: Diagnosis not present

## 2018-11-07 DIAGNOSIS — R490 Dysphonia: Secondary | ICD-10-CM | POA: Diagnosis not present

## 2018-11-07 DIAGNOSIS — Z23 Encounter for immunization: Secondary | ICD-10-CM

## 2018-11-07 DIAGNOSIS — E782 Mixed hyperlipidemia: Secondary | ICD-10-CM | POA: Diagnosis not present

## 2018-11-07 DIAGNOSIS — N183 Type 2 diabetes mellitus with diabetic chronic kidney disease: Secondary | ICD-10-CM

## 2018-11-07 DIAGNOSIS — E1122 Type 2 diabetes mellitus with diabetic chronic kidney disease: Secondary | ICD-10-CM

## 2018-11-07 LAB — LIPID PANEL
Cholesterol: 181 mg/dL (ref 0–200)
HDL: 76.4 mg/dL (ref >39.00)
LDL Cholesterol: 87 mg/dL (ref 0–99)
NonHDL: 104.13
Total CHOL/HDL Ratio: 2
Triglycerides: 85 mg/dL (ref 0.0–149.0)
VLDL: 17 mg/dL (ref 0.0–40.0)

## 2018-11-07 LAB — COMPREHENSIVE METABOLIC PANEL
ALT: 9 U/L (ref 0–35)
AST: 10 U/L (ref 0–37)
Albumin: 4.1 g/dL (ref 3.5–5.2)
Alkaline Phosphatase: 80 U/L (ref 39–117)
BUN: 31 mg/dL — ABNORMAL HIGH (ref 6–23)
CO2: 28 mEq/L (ref 19–32)
Calcium: 9.1 mg/dL (ref 8.4–10.5)
Chloride: 102 mEq/L (ref 96–112)
Creatinine, Ser: 1.17 mg/dL (ref 0.40–1.20)
GFR: 47.34 mL/min — ABNORMAL LOW (ref >60.00)
Glucose, Bld: 175 mg/dL — ABNORMAL HIGH (ref 70–99)
Potassium: 4.2 mEq/L (ref 3.5–5.1)
Sodium: 139 mEq/L (ref 135–145)
Total Bilirubin: 0.7 mg/dL (ref 0.2–1.2)
Total Protein: 7.1 g/dL (ref 6.0–8.3)

## 2018-11-07 LAB — TSH: TSH: 0.93 u[IU]/mL (ref 0.35–4.50)

## 2018-11-07 LAB — CBC
HCT: 38.9 % (ref 36.0–46.0)
Hemoglobin: 12.5 g/dL (ref 12.0–15.0)
MCHC: 32.1 g/dL (ref 30.0–36.0)
MCV: 70.6 fl — ABNORMAL LOW (ref 78.0–100.0)
Platelets: 341 10*3/uL (ref 150.0–400.0)
RBC: 5.51 Mil/uL — ABNORMAL HIGH (ref 3.87–5.11)
RDW: 15.9 % — ABNORMAL HIGH (ref 11.5–15.5)
WBC: 5.9 10*3/uL (ref 4.0–10.5)

## 2018-11-07 LAB — HEMOGLOBIN A1C: Hgb A1c MFr Bld: 8 % — ABNORMAL HIGH (ref 4.6–6.5)

## 2018-11-07 NOTE — Progress Notes (Signed)
 Subjective:    Patient ID: Nancy Barajas, female    DOB: 06/05/1960, 58 y.o.   MRN: [id number]  HPI   Patient presents to clinic for follow-up on diabetes, blood pressure, lipids and CKD.  Blood sugars have been ranging overall from the 120s to 190s.  Denies readings over 200 or any hypoglycemia.  Patient is working with our pharmacist [name] Travis for best maximization of all of her medications and [name] has been able to assist us in getting patient medication assistance.  Patient's blood pressure continues to be labile and difficult to control.  Patient states she has never seen cardiology and would be open to this idea.  She continues to work with speech therapy and ENT in regards to dysphonia.   Patient Active Problem List   Diagnosis Date Noted  . CKD stage 3 secondary to diabetes (HCC) 08/22/2018  . Laryngeal spasm 05/07/2018  . Muscle tension dysphonia 05/07/2018  . Shortness of breath 01/22/2018  . Uncontrolled hypertension 01/22/2018  . Type 2 diabetes mellitus without complication, without long-term current use of insulin (HCC) 01/22/2018  . Mixed hyperlipidemia 01/22/2018  . Mild persistent asthma 01/22/2018   Social History   Tobacco Use  . Smoking status: Former Smoker    Packs/day: 1.00    Years: 10.00    Pack years: 10.00  . Smokeless tobacco: Never Used  Substance Use Topics  . Alcohol use: Never    Frequency: Never   Review of Systems  Constitutional: Negative for chills, fatigue and fever.  HENT: Negative for congestion, ear pain, sinus pain and sore throat.   Eyes: Negative.   Respiratory: Negative for cough, shortness of breath and wheezing.   Cardiovascular: Negative for chest pain, palpitations and leg swelling.  Gastrointestinal: Negative for abdominal pain, diarrhea, nausea and vomiting.  Genitourinary: Negative for dysuria, frequency and urgency.  Musculoskeletal: Negative for arthralgias and myalgias.  Skin: Negative for color change,  pallor and rash.  Neurological: Negative for syncope, light-headedness and headaches.  Psychiatric/Behavioral: The patient is not nervous/anxious.       Objective:   Physical Exam Vitals signs and nursing note reviewed.  Constitutional:      General: She is not in acute distress.    Appearance: She is obese. She is not toxic-appearing.  HENT:     Head: Normocephalic and atraumatic.     Right Ear: Tympanic membrane, ear canal and external ear normal.     Left Ear: Tympanic membrane, ear canal and external ear normal.     Mouth/Throat:     Comments: Voice sounds "breathy", this is chronic related to muscle dysphonia Eyes:     Extraocular Movements: Extraocular movements intact.     Conjunctiva/sclera: Conjunctivae normal.     Pupils: Pupils are equal, round, and reactive to light.  Cardiovascular:     Rate and Rhythm: Normal rate and regular rhythm.     Heart sounds: Normal heart sounds.  Pulmonary:     Effort: Pulmonary effort is normal. No respiratory distress.     Breath sounds: Normal breath sounds.  Musculoskeletal:     Right lower leg: No edema.     Left lower leg: No edema.  Skin:    General: Skin is warm and dry.     Coloration: Skin is not jaundiced or pale.  Neurological:     General: No focal deficit present.     Mental Status: She is alert and oriented to person, place, and time.     Gait:   Gait normal.  Psychiatric:        Mood and Affect: Mood normal.        Behavior: Behavior normal.     Today's Vitals   11/07/18 0806 11/07/18 0827  BP: (!) 156/108 (!) 148/96  Pulse: 86   Temp: (!) 96.7 F (35.9 C)   TempSrc: Temporal   SpO2: 99%   Weight: 247 lb 12.8 oz (112.4 kg)   Height: 5' 8" (1.727 m)    Body mass index is 37.68 kg/m.     Assessment & Plan:    Uncontrolled hypertension - Plan: Ambulatory referral to Cardiology, CBC, Comp Met (CMET), Ambulatory referral to Ophthalmology  CKD stage 3 secondary to diabetes ([place]) - Plan: Comp Met (CMET)   Type 2 diabetes mellitus without complication, without long-term current use of insulin ([place]) - Plan: Hemoglobin A1c, TSH, Ambulatory referral to Ophthalmology  Mixed hyperlipidemia - Plan: Ambulatory referral to Cardiology, TSH, Lipid panel, Ambulatory referral to Ophthalmology  Muscle tension dysphonia  Need for immunization against influenza - Plan: Flu Vaccine QUAD 36+ mos IM  We will refer patient to cardiology for help in controlling her blood pressure better.  We will get new lab work in clinic today.  We will refer patient to ophthalmology as well for screening of eyes due to having diabetes, blood pressure and cholesterol issues.  She will continue speech therapy and ENT appointments due to muscle tension dysphonia.  Flu vaccine given in clinic today.  She will follow up in 3 months for management of chronic conditions and return to clinic if any issues arise.

## 2018-11-08 ENCOUNTER — Ambulatory Visit (INDEPENDENT_AMBULATORY_CARE_PROVIDER_SITE_OTHER): Payer: Medicare Other | Admitting: Psychology

## 2018-11-08 DIAGNOSIS — F418 Other specified anxiety disorders: Secondary | ICD-10-CM

## 2018-11-09 DIAGNOSIS — I10 Essential (primary) hypertension: Secondary | ICD-10-CM | POA: Diagnosis not present

## 2018-11-12 ENCOUNTER — Other Ambulatory Visit: Payer: Medicare Other

## 2018-11-12 ENCOUNTER — Other Ambulatory Visit: Payer: Self-pay

## 2018-11-19 ENCOUNTER — Other Ambulatory Visit: Payer: Self-pay | Admitting: Family Medicine

## 2018-11-19 DIAGNOSIS — I10 Essential (primary) hypertension: Secondary | ICD-10-CM

## 2018-11-19 MED ORDER — CLONIDINE HCL 0.1 MG PO TABS: 0.1000 mg | ORAL_TABLET | Freq: Two times a day (BID) | ORAL | 1 refills | Status: DC

## 2018-11-19 MED ORDER — CARVEDILOL 25 MG PO TABS: 25.0000 mg | ORAL_TABLET | Freq: Two times a day (BID) | ORAL | 1 refills | Status: DC

## 2018-11-19 NOTE — Telephone Encounter (Signed)
Medication: carvedilol (COREG) 25 MG tablet [id number] ,  cloNIDine (CATAPRES) 0.1 MG tablet [id number] , cloNIDine (CATAPRES) 0.1 MG tablet [id number]   Has the patient contacted their pharmacy? Yes  (Agent: If no, request that the patient contact the pharmacy for the refill.) (Agent: If yes, when and what did the pharmacy advise?)  Preferred Pharmacy (with phone number or street name): [facility] (N), [place] [phone] (Phone) [phone] (Fax)    Agent: Please be advised that RX refills may take up to 3 business days. We ask that you follow-up with your pharmacy.

## 2018-11-21 ENCOUNTER — Other Ambulatory Visit: Payer: Self-pay | Admitting: Family Medicine

## 2018-11-21 ENCOUNTER — Other Ambulatory Visit: Payer: Self-pay

## 2018-11-21 ENCOUNTER — Telehealth: Payer: Self-pay

## 2018-11-21 DIAGNOSIS — I10 Essential (primary) hypertension: Secondary | ICD-10-CM

## 2018-11-21 MED ORDER — HYDRALAZINE HCL 50 MG PO TABS: 50.0000 mg | ORAL_TABLET | Freq: Two times a day (BID) | ORAL | 1 refills | Status: DC

## 2018-11-21 NOTE — Telephone Encounter (Signed)
Notified patient Clonidine called to pharmacy at new dosage and should be able to fill since new dose.

## 2018-11-21 NOTE — Telephone Encounter (Signed)
Copied from [place] [id number]. Topic: Quick Communication - Rx Refill/Question >> Nov 21, 2018 12:13 PM Erick Blinks wrote: Pharmacy called stating that pt is expecting 0.2 cloNIDine (CATAPRES), per discussion with PCP. please advise

## 2018-11-21 NOTE — Telephone Encounter (Signed)
Pt is out of cloNIDine (CATAPRES) 0.1 MG tablet She stated that she was advise to up the dose and take 2 pills two times a day/ so she has run out of medication and is not schedule for insurance to cover refill until 12.25.20/ Pt would like to know options and what can be done for her until then/ please advise

## 2018-11-21 NOTE — Telephone Encounter (Signed)
Medication Refill - Medication: hydrALAZINE (APRESOLINE) 50 MG tablet  Has the patient contacted their pharmacy? Yes.   (Agent: If no, request that the patient contact the pharmacy for the refill.) (Agent: If yes, when and what did the pharmacy advise?)  Preferred Pharmacy (with phone number or street name): [facility] (N),  - [address]  Agent: Please be advised that RX refills may take up to 3 business days. We ask that you follow-up with your pharmacy.

## 2018-11-21 NOTE — Telephone Encounter (Signed)
I have called & clarified with patient as well as pharmacy. I am unsure what patient was doing exactly to run short. She will just pay the 90 day supply out of pocket cost for $10 until insurance will pay.

## 2018-11-22 ENCOUNTER — Ambulatory Visit (INDEPENDENT_AMBULATORY_CARE_PROVIDER_SITE_OTHER): Payer: Medicare Other | Admitting: Pharmacist

## 2018-11-22 ENCOUNTER — Ambulatory Visit: Payer: Medicare Other | Admitting: Cardiology

## 2018-11-22 DIAGNOSIS — I10 Essential (primary) hypertension: Secondary | ICD-10-CM

## 2018-11-22 DIAGNOSIS — E119 Type 2 diabetes mellitus without complications: Secondary | ICD-10-CM

## 2018-11-22 NOTE — Patient Instructions (Addendum)
Visit Information  Goals Addressed            This Visit's Progress     Patient Stated   . "I want to control my diabetes" (pt-stated)       Current Barriers:  . Diabetes: uncontrolled; most recent A1c 8.0% . Current antihyperglycemic regimen: Trulicity 1.5 mg once weekly o Diagnosed in 2016  o Previously on metformin, stopped d/t kidney function decline o Stopped Jardiance d/t yeast infection coming back, even on 12.5 mg daily  . Current glucose readings:   Fasting After Meal SBP  1-Nov 145  151  2-Nov 156  146  3-Nov 134  149  4-Nov 123    6-Nov 129  158  7-Nov  146   8-Nov 127  137  9-Nov 163    11-Nov 165    12-Nov 156  118  13-Nov 129    14-Nov 136 158 126  15-Nov  151 129  16-Nov 129  154  17-Nov 129 152 156  18-Nov 122    Average 139 152 142   . Current meal patterns; does have food stamps . Current exercise: Notes "anxiety and depression" surrounding current events contributing to lack of motivation and exercise . Cardiovascular risk reduction: o Current hypertensive regimen: carvedilol 25 mg BID, amlodipine 10 mg daily, clonidine 0.2 mg BID, hydralazine 25 mg BID, losartan 25 mg daily recently initiated; see HTN goal o Current hyperlipidemia regimen: lovastatin 20 mg daily; last LDL not at goal <70; was unable to start rosuvastatin 10 mg daily previously d/t insurance issue  Pharmacist Clinical Goal(s):  Marland Kitchen Over the next 90 days, patient with work with PharmD and primary care provider to address optimized glycemic management  Interventions: . Comprehensive medication review performed; medication list updated in Epic . Recommend increasing Trulicity to 3 mg weekly. Recommended patient inject 2- 1.5 mg syringes to use current supply; as patient is approved for [employer] through the end of the year, will collaborate w/ clinic staff to send prescription for 3 mg pen to patient assistance. As patient now has Medicare Extra Help, we cannot reapply for assistance  for 2021, so patient will need to get through her pharmacy.  . Contacted the pharmacy; rosuvastatin 10 mg daily is now running through her insurance for $3.95/90 days. Asked them to fill this for the patient. Updated medication list to reflect that this medication is prescribed, but has not been started yet and last lipid panel reflects lovastatin  Patient Self Care Activities:  . Patient will check blood glucose BID, document, and provide at future appointments . Patient will take medications as prescribed . Patient will report any questions or concerns to provider   Please see past updates related to this goal by clicking on the "Past Updates" button in the selected goal      . "My BP is hard to control" (pt-stated)       Current Barriers:  . Uncontrolled hypertension, complicated by DM, CKD o Follows with [place] [facility]  . Current antihypertensive regimen: carvedilol 25 mg BID, amlodipine 10 mg QAM, clonidine 0.2 mg BID, hydralazine 50 mg BID, losartan 25 mg QAM o Prescription for clonidine had not been updated to reflect 0.2 mg daily, so patient ran out over the weekend and the pharmacy was unable to fill it. Has been out for ~2 days  . Previous antihypertensives tried: spironolactone d/c d/t declining renal function in 02/2018;  . Current home BP readings:  SBP DBP BP  1-Nov 151 102 82  2-Nov 146 94 93  3-Nov 149 87 88  4-Nov     6-Nov 158 89 75  7-Nov     8-Nov 137 82 92  9-Nov     11-Nov     12-Nov 118 78 92  13-Nov     14-Nov 126 91 79  15-Nov 129 99 82  16-Nov 154 93 90  17-Nov 156 91 94  18-Nov     Average 142 91 97    Pharmacist Clinical Goal(s):  Marland Kitchen Over the next 90 days, patient will work with PharmD and providers to optimize antihypertensive regimen  Interventions: . Reviewed BP log with patient. Recommended she bring these results to upcoming visit with cardiology later this morning, and discuss need for updated  clonidine prescription to reflect 0.2 mg BID, if this dose is continued . Sent in basket message to cardiolgist Dr. Garen Lah and CMA Othelia Pulling to update on current antihypertensive and antihyperlipidemia regimen prior to her appointment with them later this morning   Patient Self Care Activities:  . Patient will continue to check BP daily, document, and provide at future appointments . Patient will focus on medication adherence   Please see past updates related to this goal by clicking on the "Past Updates" button in the selected goal         The patient verbalized understanding of instructions provided today and declined a print copy of patient instruction materials.   Plan: - Will f/u with patient in ~4-5 weeks for continued medication management support  [name], PharmD, [facility] Pharmacist [place] [phone]

## 2018-11-22 NOTE — Chronic Care Management (AMB) (Signed)
Chronic Care Management   Follow Up Note   11/22/2018 Name: Nancy Barajas MRN: [id number] DOB: 03-Aug-1960  Referred by: Nancy Green, FNP Reason for referral : Chronic Care Management (Medication Management)   Nancy Barajas is a 58 y.o. year old female who is a primary care patient of Guse, Jacquelynn Cree, FNP. The CCM team was consulted for assistance with chronic disease management and care coordination needs.    Contacted patient for medication management review today.   Review of patient status, including review of consultants reports, relevant laboratory and other test results, and collaboration with appropriate care team members and the patient's provider was performed as part of comprehensive patient evaluation and provision of chronic care management services.    SDOH (Social Determinants of Health) screening performed today: Financial Strain . See Care Plan for related entries.   Outpatient Encounter Medications as of 11/22/2018  Medication Sig Note  . amLODipine (NORVASC) 10 MG tablet Take 1 tablet (10 mg total) by mouth daily. 09/13/2018: QAM  . aspirin 81 MG chewable tablet Chew 81 mg by mouth daily.   . blood glucose meter kit and supplies Dispense based on patient and insurance preference. Use up to four times daily as directed. (FOR ICD-10 E10.9, E11.9).   . carvedilol (COREG) 25 MG tablet Take 1 tablet (25 mg total) by mouth 2 (two) times daily with a meal.   . Cholecalciferol (VITAMIN D-3) 125 MCG (5000 UT) TABS Take 1 tablet by mouth daily.   . Dulaglutide (TRULICITY) 1.5 IR/4.4RX SOPN Inject 1.5 mg into the skin every 7 (seven) days.   . hydrALAZINE (APRESOLINE) 50 MG tablet Take 1 tablet (50 mg total) by mouth 2 (two) times daily.   Marland Kitchen losartan (COZAAR) 25 MG tablet Take 25 mg by mouth daily. 11/22/2018: QAM  . [DISCONTINUED] lovastatin (MEVACOR) 20 MG tablet Take 20 mg by mouth at bedtime.   . cloNIDine (CATAPRES) 0.1 MG tablet Take 1 tablet (0.1 mg total) by  mouth 2 (two) times daily. (Patient not taking: Reported on 11/22/2018) 11/22/2018: Had been taking 0.2 mg BID per 08/2018 note from Nancy Barajas; has been out for ~2-3 days  . rosuvastatin (CRESTOR) 10 MG tablet Take 10 mg by mouth daily. 11/22/2018: Has NOT started taking yet, was on lovastatin 20 mg   No facility-administered encounter medications on file as of 11/22/2018.      Goals Addressed            This Visit's Progress     Patient Stated   . "I want to control my diabetes" (pt-stated)       Current Barriers:  . Diabetes: uncontrolled; most recent A1c 8.0% . Current antihyperglycemic regimen: Trulicity 1.5 mg once weekly o Diagnosed in 2016  o Previously on metformin, stopped d/t kidney function decline o Stopped Jardiance d/t yeast infection coming back, even on 12.5 mg daily  . Current glucose readings:   Fasting After Meal SBP  1-Nov 145  151  2-Nov 156  146  3-Nov 134  149  4-Nov 123    6-Nov 129  158  7-Nov  146   8-Nov 127  137  9-Nov 163    11-Nov 165    12-Nov 156  118  13-Nov 129    14-Nov 136 158 126  15-Nov  151 129  16-Nov 129  154  17-Nov 129 152 156  18-Nov 122    Average 139 152 142   . Current meal patterns; does have food stamps .  Current exercise: Notes "anxiety and depression" surrounding current events contributing to lack of motivation and exercise . Cardiovascular risk reduction: o Current hypertensive regimen: carvedilol 25 mg BID, amlodipine 10 mg daily, clonidine 0.2 mg BID, hydralazine 25 mg BID, losartan 25 mg daily recently initiated; see HTN goal o Current hyperlipidemia regimen: lovastatin 20 mg daily; last LDL not at goal <70; was unable to start rosuvastatin 10 mg daily previously d/t insurance issue  Pharmacist Clinical Goal(s):  Marland Kitchen Over the next 90 days, patient with work with PharmD and primary care provider to address optimized glycemic management  Interventions: . Comprehensive medication review performed; medication list  updated in Epic . Recommend increasing Trulicity to 3 mg weekly; will collaborate w/ Dr. Tyler Barajas on this recommendation. Patient can inject 2- 1.5 mg syringes to use current supply; as patient is approved for [employer] through the end of the year, can collaborate w/ clinic staff to send prescription for 3 mg pen to patient assistance. As patient now has Medicare Extra Help, we cannot reapply for assistance for 2021, so patient will need to get through her pharmacy.  . Contacted the pharmacy; rosuvastatin 10 mg daily is now running through her insurance for $3.95/90 days. Asked them to fill this for the patient. Updated medication list to reflect that this medication is prescribed, but has not been started yet and last lipid panel reflects lovastatin  Patient Self Care Activities:  . Patient will check blood glucose BID, document, and provide at future appointments . Patient will take medications as prescribed . Patient will report any questions or concerns to provider   Please see past updates related to this goal by clicking on the "Past Updates" button in the selected goal      . "My BP is hard to control" (pt-stated)       Current Barriers:  . Uncontrolled hypertension, complicated by DM, CKD o Follows with [place] today [facility]  . Current antihypertensive regimen: carvedilol 25 mg BID, amlodipine 10 mg QAM, clonidine 0.2 mg BID, hydralazine 50 mg BID, losartan 25 mg QAM o Prescription for clonidine had not been updated to reflect 0.2 mg daily, so patient ran out over the weekend and the pharmacy was unable to fill it. Has been out for ~2 days  . Previous antihypertensives tried: spironolactone d/c d/t declining renal function in 02/2018;  . Current home BP readings:   SBP DBP BP  1-Nov 151 102 82  2-Nov 146 94 93  3-Nov 149 87 88  4-Nov     6-Nov 158 89 75  7-Nov     8-Nov 137 82 92  9-Nov     11-Nov     12-Nov 118 78 92  13-Nov      14-Nov 126 91 79  15-Nov 129 99 82  16-Nov 154 93 90  17-Nov 156 91 94  18-Nov     Average 142 91 97    Pharmacist Clinical Goal(s):  Marland Kitchen Over the next 90 days, patient will work with PharmD and providers to optimize antihypertensive regimen  Interventions: . Reviewed BP log with patient. Recommended she bring these results to upcoming visit with cardiology later this morning, and discuss need for updated clonidine prescription to reflect 0.2 mg BID, if this dose is continued . Sent in basket message to cardiolgist Dr. Garen Lah and CMA Othelia Pulling to update on current antihypertensive and antihyperlipidemia regimen prior to her appointment with them later this morning   Patient  Self Care Activities:  . Patient will continue to check BP daily, document, and provide at future appointments . Patient will focus on medication adherence   Please see past updates related to this goal by clicking on the "Past Updates" button in the selected goal         Plan: - Will f/u with patient in ~4-5 weeks for continued medication management support  [name], PharmD, [place] Pharmacist [place] [phone]

## 2018-11-23 ENCOUNTER — Ambulatory Visit (INDEPENDENT_AMBULATORY_CARE_PROVIDER_SITE_OTHER): Payer: Medicare Other | Admitting: Psychology

## 2018-11-23 DIAGNOSIS — F418 Other specified anxiety disorders: Secondary | ICD-10-CM | POA: Diagnosis not present

## 2018-11-26 ENCOUNTER — Ambulatory Visit: Payer: Self-pay | Admitting: Pharmacist

## 2018-11-26 DIAGNOSIS — E119 Type 2 diabetes mellitus without complications: Secondary | ICD-10-CM

## 2018-11-26 NOTE — Patient Instructions (Signed)
Visit Information  Goals Addressed            This Visit's Progress     Patient Stated   . "I want to control my diabetes" (pt-stated)       Current Barriers:  . Diabetes: uncontrolled; most recent A1c 8.0% . Current antihyperglycemic regimen: Trulicity 3 mg once weekly - collaborated w/ Dr. Terese Door w/ discussion w/ endocrinology; in agreement to increase Trulicity to 3 mg weekly o Diagnosed in 2016  o Previously on metformin, stopped d/t kidney function decline o Stopped Jardiance d/t yeast infection coming back, even on 12.5 mg daily  . Current meal patterns; does have food stamps . Current exercise: Notes "anxiety and depression" surrounding current events contributing to lack of motivation and exercise . Cardiovascular risk reduction: o Current hypertensive regimen: carvedilol 25 mg BID, amlodipine 10 mg daily, clonidine 0.2 mg BID, hydralazine 25 mg BID, losartan 25 mg daily recently initiated; see HTN goal o Current hyperlipidemia regimen: rosuvastatin 10 mg; last LDL not at goal <70 on lovastatin  Pharmacist Clinical Goal(s):  Marland Kitchen Over the next 90 days, patient with work with PharmD and primary care provider to address optimized glycemic management  Interventions: . Patient to inject 2 x Trulicity 1.5 mg weekly to use current supply. Will discuss tolerability at our next appointment in December. At that time, if tolerated, will collaborate w/ Dr. Danae Orleans to send prescription for 3 mg syringe to pharmacy.  . Will also plan to discuss Dietician referral.  Patient Self Care Activities:  . Patient will check blood glucose BID, document, and provide at future appointments . Patient will take medications as prescribed . Patient will report any questions or concerns to provider   Please see past updates related to this goal by clicking on the "Past Updates" button in the selected goal         The patient verbalized understanding of instructions provided today  and declined a print copy of patient instruction materials.   Plan: - Will outreach patient in ~3-4 weeks as scheduled  [name], PharmD, [place] Pharmacist [place] [phone]

## 2018-11-26 NOTE — Chronic Care Management (AMB) (Signed)
Chronic Care Management   Follow Up Note   11/26/2018 Name: Nancy Barajas MRN: [id number] DOB: 05/02/1960  Referred by: Jodelle Green, FNP Reason for referral : Chronic Care Management (Medication Management)   Nancy Barajas is a 58 y.o. year old female who is a primary care patient of Guse, Jacquelynn Cree, FNP. The CCM team was consulted for assistance with chronic disease management and care coordination needs.    Care coordination completed today.   Review of patient status, including review of consultants reports, relevant laboratory and other test results, and collaboration with appropriate care team members and the patient's provider was performed as part of comprehensive patient evaluation and provision of chronic care management services.    SDOH (Social Determinants of Health) screening performed today: Financial Strain . See Care Plan for related entries.   Outpatient Encounter Medications as of 11/26/2018  Medication Sig Note  . amLODipine (NORVASC) 10 MG tablet Take 1 tablet (10 mg total) by mouth daily. 09/13/2018: QAM  . aspirin 81 MG chewable tablet Chew 81 mg by mouth daily.   . blood glucose meter kit and supplies Dispense based on patient and insurance preference. Use up to four times daily as directed. (FOR ICD-10 E10.9, E11.9).   . carvedilol (COREG) 25 MG tablet Take 1 tablet (25 mg total) by mouth 2 (two) times daily with a meal.   . Cholecalciferol (VITAMIN D-3) 125 MCG (5000 UT) TABS Take 1 tablet by mouth daily.   . cloNIDine (CATAPRES) 0.1 MG tablet Take 1 tablet (0.1 mg total) by mouth 2 (two) times daily. (Patient not taking: Reported on 11/22/2018) 11/22/2018: Had been taking 0.2 mg BID per 08/2018 note from [facility]; has been out for ~2-3 days  . Dulaglutide (TRULICITY) 1.5 TM/5.4YT SOPN Inject 1.5 mg into the skin every 7 (seven) days.   . hydrALAZINE (APRESOLINE) 50 MG tablet Take 1 tablet (50 mg total) by mouth 2 (two) times daily.   Marland Kitchen losartan  (COZAAR) 25 MG tablet Take 25 mg by mouth daily. 11/22/2018: QAM  . rosuvastatin (CRESTOR) 10 MG tablet Take 10 mg by mouth daily. 11/22/2018: Has NOT started taking yet, was on lovastatin 20 mg   No facility-administered encounter medications on file as of 11/26/2018.      Goals Addressed            This Visit's Progress     Patient Stated   . "I want to control my diabetes" (pt-stated)       Current Barriers:  . Diabetes: uncontrolled; most recent A1c 8.0% . Current antihyperglycemic regimen: Trulicity 3 mg once weekly - collaborated w/ Dr. Terese Door w/ discussion w/ endocrinology; in agreement to increase Trulicity to 3 mg weekly o Diagnosed in 2016  o Previously on metformin, stopped d/t kidney function decline o Stopped Jardiance d/t yeast infection coming back, even on 12.5 mg daily  . Current meal patterns; does have food stamps . Current exercise: Notes "anxiety and depression" surrounding current events contributing to lack of motivation and exercise . Cardiovascular risk reduction: o Current hypertensive regimen: carvedilol 25 mg BID, amlodipine 10 mg daily, clonidine 0.2 mg BID, hydralazine 25 mg BID, losartan 25 mg daily recently initiated; see HTN goal o Current hyperlipidemia regimen: rosuvastatin 10 mg; last LDL not at goal <70 on lovastatin  Pharmacist Clinical Goal(s):  Marland Kitchen Over the next 90 days, patient with work with PharmD and primary care provider to address optimized glycemic management  Interventions: . Patient to inject 2 x  Trulicity 1.5 mg weekly to use current supply. Will discuss tolerability at our next appointment in December. At that time, if tolerated, will collaborate w/ Dr. Danae Orleans to send prescription for 3 mg syringe to pharmacy.  . Will also plan to discuss Dietician referral.  Patient Self Care Activities:  . Patient will check blood glucose BID, document, and provide at future appointments . Patient will take medications as  prescribed . Patient will report any questions or concerns to provider   Please see past updates related to this goal by clicking on the "Past Updates" button in the selected goal         Plan: - Will outreach patient in ~3-4 weeks as scheduled  [name], PharmD, [place] Pharmacist [facility] [place] [phone]

## 2018-12-06 ENCOUNTER — Encounter: Payer: Self-pay | Admitting: Cardiology

## 2018-12-06 ENCOUNTER — Ambulatory Visit (INDEPENDENT_AMBULATORY_CARE_PROVIDER_SITE_OTHER): Payer: Medicare Other | Admitting: Cardiology

## 2018-12-06 ENCOUNTER — Other Ambulatory Visit: Payer: Self-pay

## 2018-12-06 VITALS — BP 150/102 | HR 95 | Ht 68.0 in | Wt 249.0 lb

## 2018-12-06 DIAGNOSIS — E669 Obesity, unspecified: Secondary | ICD-10-CM

## 2018-12-06 DIAGNOSIS — I10 Essential (primary) hypertension: Secondary | ICD-10-CM | POA: Diagnosis not present

## 2018-12-06 MED ORDER — LOSARTAN POTASSIUM 50 MG PO TABS: 50.0000 mg | ORAL_TABLET | Freq: Every day | ORAL | 3 refills | Status: DC

## 2018-12-06 NOTE — Progress Notes (Signed)
Cardiology Office Note:    Date:  12/06/2018   ID:  Aslee Such, DOB August 19, 1960, MRN [id number]  PCP:  Jodelle Green, FNP  Cardiologist:  Kate Sable, MD  Electrophysiologist:  None   Referring MD: Jodelle Green, FNP   Chief Complaint  Patient presents with  . New Patient (Initial Visit)    uncontrolled HTN and hyprlipidemia; Meds verbally reviewed with patient.    History of Present Illness:    Nancy Barajas is a 58 y.o. female with a hx of hypertension, diabetes, hyperlipidemia, CKD stage III, who presents due to uncontrolled hypertension.  Patient was diagnosed with hypertension about 4 years ago.  She has tried a couple of medications with some improvement but her blood pressure is still elevated.  She endorses not eating healthy low-salt diet.  When diagnosed her blood pressures were usually in the 355D to 322G systolic.  Currently they range anywhere from 140s to 160s.  She was recently started on losartan 25 mg daily by her nephrologist.  She denies any history of heart disease, heart attacks, chest pain or shortness of breath.  She has difficulty breathing mainly due to her vocal cord dysfunction.  Past Medical History:  Diagnosis Date  . Asthma   . Diabetes mellitus without complication ([place])   . Hypertension     Past Surgical History:  Procedure Laterality Date  . LITHOTRIPSY      Current Medications: Current Meds  Medication Sig  . amLODipine (NORVASC) 10 MG tablet Take 1 tablet (10 mg total) by mouth daily.  Marland Kitchen aspirin 81 MG chewable tablet Chew 81 mg by mouth every other day.   . blood glucose meter kit and supplies Dispense based on patient and insurance preference. Use up to four times daily as directed. (FOR ICD-10 E10.9, E11.9).  . carvedilol (COREG) 25 MG tablet Take 1 tablet (25 mg total) by mouth 2 (two) times daily with a meal.  . Cholecalciferol (VITAMIN D-3) 125 MCG (5000 UT) TABS Take 1 tablet by mouth daily.  . cloNIDine (CATAPRES) 0.2  MG tablet Take 0.2 mg by mouth 2 (two) times daily.  . Dulaglutide (TRULICITY) 1.5 UR/4.2HC SOPN Inject 1.5 mg into the skin every 7 (seven) days.  . hydrALAZINE (APRESOLINE) 50 MG tablet Take 1 tablet (50 mg total) by mouth 2 (two) times daily.  . rosuvastatin (CRESTOR) 10 MG tablet Take 10 mg by mouth daily.  . [DISCONTINUED] losartan (COZAAR) 25 MG tablet Take 25 mg by mouth daily.     Allergies:   Penicillins   Social History   Socioeconomic History  . Marital status: Divorced    Spouse name: Not on file  . Number of children: 2  . Years of education: Not on file  . Highest education level: 9th grade  Occupational History  . Not on file  Social Needs  . Financial resource strain: Not on file  . Food insecurity    Worry: Not on file    Inability: Not on file  . Transportation needs    Medical: Not on file    Non-medical: Not on file  Tobacco Use  . Smoking status: Former Smoker    Packs/day: 1.00    Years: 10.00    Pack years: 10.00  . Smokeless tobacco: Never Used  Substance and Sexual Activity  . Alcohol use: Never    Frequency: Never  . Drug use: Never  . Sexual activity: Not Currently  Lifestyle  . Physical activity    Days  per week: Not on file    Minutes per session: Not on file  . Stress: Not on file  Relationships  . Social Herbalist on phone: Not on file    Gets together: Not on file    Attends religious service: Not on file    Active member of club or organization: Not on file    Attends meetings of clubs or organizations: Not on file    Relationship status: Not on file  Other Topics Concern  . Not on file  Social History Narrative  . Not on file     Family History: The patient's family history includes Diabetes in her father and mother; Hypertension in her father and mother.  ROS:   Please see the history of present illness.     All other systems reviewed and are negative.  EKGs/Labs/Other Studies Reviewed:    The following  studies were reviewed today:   EKG:  EKG is  ordered today.  The ekg ordered today demonstrates normal sinus rhythm, left bundle branch block.  Recent Labs: 11/07/2018: ALT 9; BUN 31; Creatinine, Ser 1.17; Hemoglobin 12.5; Platelets 341.0; Potassium 4.2; Sodium 139; TSH 0.93  Recent Lipid Panel    Component Value Date/Time   CHOL 181 11/07/2018 0830   TRIG 85.0 11/07/2018 0830   HDL 76.40 11/07/2018 0830   CHOLHDL 2 11/07/2018 0830   VLDL 17.0 11/07/2018 0830   LDLCALC 87 11/07/2018 0830    Physical Exam:    VS:  BP (!) 150/102 (BP Location: Right Arm, Patient Position: Sitting, Cuff Size: Normal)   Pulse 95   Ht '5\' 8"'  (1.727 m)   Wt 249 lb (112.9 kg)   BMI 37.86 kg/m     Wt Readings from Last 3 Encounters:  12/06/18 249 lb (112.9 kg)  11/07/18 247 lb 12.8 oz (112.4 kg)  08/22/18 254 lb 6.4 oz (115.4 kg)     GEN:  Well nourished, well developed in no acute distress, obese HEENT: Normal NECK: No JVD; No carotid bruits LYMPHATICS: No lymphadenopathy CARDIAC: RRR, no murmurs, rubs, gallops RESPIRATORY:  Clear to auscultation without rales, wheezing or rhonchi  ABDOMEN: Soft, non-tender, non-distended MUSCULOSKELETAL:  No edema; No deformity  SKIN: Warm and dry NEUROLOGIC:  Alert and oriented x 3 PSYCHIATRIC:  Normal affect   ASSESSMENT:   Patient still with elevated blood pressure.  There is still room to optimize her BP medications.  Creatinine earlier this year was 1.5, last creatinine was within normal limits.  Will avoid diuretics for now to prevent an acute kidney injury.  1. Essential hypertension   2. Class 2 obesity with body mass index (BMI) of 35 to 39.9 without comorbidity    PLAN:    In order of problems listed above:  1. Increase losartan to 50 mg daily.  Patient encouraged to check BP daily at home.  We will plan to titrate losartan to achieve control blood pressures.  Continue amlodipine 10 mg daily, Coreg 25 mg twice daily.  Hydralazine 50 twice  daily, clonidine.  Plan to titrate off clonidine if blood pressure improves with increasing losartan dose. 2. Low calorie diet and weight loss advised.    This note was generated in part or whole with voice recognition software. Voice recognition is usually quite accurate but there are transcription errors that can and very often do occur. I apologize for any typographical errors that were not detected and corrected.  Medication Adjustments/Labs and Tests Ordered: Current medicines are  reviewed at length with the patient today.  Concerns regarding medicines are outlined above.  Orders Placed This Encounter  Procedures  . EKG 12-Lead   Meds ordered this encounter  Medications  . losartan (COZAAR) 50 MG tablet    Sig: Take 1 tablet (50 mg total) by mouth daily.    Dispense:  90 tablet    Refill:  3    Patient Instructions  Medication Instructions:  Your physician has recommended you make the following change in your medication:  1- INCREASE Losartan to 50 mg by mouth once a day.  *If you need a refill on your cardiac medications before your next appointment, please call your pharmacy*  Lab Work: none If you have labs (blood work) drawn today and your tests are completely normal, you will receive your results only by: Marland Kitchen MyChart Message (if you have MyChart) OR . A paper copy in the mail If you have any lab test that is abnormal or we need to change your treatment, we will call you to review the results.  Testing/Procedures: none  Follow-Up: At [facility], you and your health needs are our priority.  As part of our continuing mission to provide you with exceptional heart care, we have created designated Provider Care Teams.  These Care Teams include your primary Cardiologist (physician) and Advanced Practice Providers (APPs -  Physician Assistants and Nurse Practitioners) who all work together to provide you with the care you need, when you need it.  Your next appointment:    2 week(s)  The format for your next appointment:   In Person  Provider:   Kate Sable, MD      Signed, Kate Sable, MD  12/06/2018 11:42 AM    [place]

## 2018-12-06 NOTE — Patient Instructions (Signed)
Medication Instructions:  Your physician has recommended you make the following change in your medication:  1- INCREASE Losartan to 50 mg by mouth once a day.  *If you need a refill on your cardiac medications before your next appointment, please call your pharmacy*  Lab Work: none If you have labs (blood work) drawn today and your tests are completely normal, you will receive your results only by: Marland Kitchen MyChart Message (if you have MyChart) OR . A paper copy in the mail If you have any lab test that is abnormal or we need to change your treatment, we will call you to review the results.  Testing/Procedures: none  Follow-Up: At [facility], you and your health needs are our priority.  As part of our continuing mission to provide you with exceptional heart care, we have created designated Provider Care Teams.  These Care Teams include your primary Cardiologist (physician) and Advanced Practice Providers (APPs -  Physician Assistants and Nurse Practitioners) who all work together to provide you with the care you need, when you need it.  Your next appointment:   2 week(s)  The format for your next appointment:   In Person  Provider:   Kate Sable, MD

## 2018-12-12 ENCOUNTER — Ambulatory Visit: Payer: Medicare Other | Admitting: Psychology

## 2018-12-20 ENCOUNTER — Telehealth: Payer: Medicare Other

## 2018-12-21 ENCOUNTER — Ambulatory Visit (INDEPENDENT_AMBULATORY_CARE_PROVIDER_SITE_OTHER): Payer: Medicare Other | Admitting: Cardiology

## 2018-12-21 ENCOUNTER — Encounter: Payer: Self-pay | Admitting: Cardiology

## 2018-12-21 ENCOUNTER — Other Ambulatory Visit: Payer: Self-pay

## 2018-12-21 VITALS — BP 160/90 | HR 95 | Ht 71.0 in | Wt 246.5 lb

## 2018-12-21 DIAGNOSIS — I10 Essential (primary) hypertension: Secondary | ICD-10-CM

## 2018-12-21 DIAGNOSIS — I517 Cardiomegaly: Secondary | ICD-10-CM

## 2018-12-21 MED ORDER — LOSARTAN POTASSIUM 50 MG PO TABS: ORAL_TABLET | ORAL | 3 refills | Status: DC

## 2018-12-21 NOTE — Progress Notes (Signed)
Cardiology Office Note:    Date:  12/21/2018   ID:  [name], DOB Jun 17, 1960, MRN [id number]  PCP:  Jodelle Green, FNP  Cardiologist:  Kate Sable, MD  Electrophysiologist:  None   Referring MD: Jodelle Green, FNP   Chief Complaint  Patient presents with  . office visit    2 week F/U; Meds verbally reviewed with patient.    History of Present Illness:    Nancy Barajas is a 58 y.o. female with a hx of hypertension, diabetes, hyperlipidemia, CKD stage III, who presents for follow-up.  She was originally seen due to uncontrolled hypertension.  Patient was diagnosed with hypertension about 4 years ago.  She has tried a couple of medications with some improvement but her blood pressure is still elevated.  She endorsed not eating healthy low-salt diet.  When diagnosed her blood pressures were usually in the 712R to 975O systolic.  At last visit, they range anywhere from 140s to 160s.  She was recently started on losartan 25 mg daily by her nephrologist.  She denies any history of heart disease, heart attacks, chest pain or shortness of breath.  She has difficulty breathing mainly due to her vocal cord dysfunction.  Losartan was increased to 50 mg daily after last visit.  She has blood pressure log which indicates blood pressures are better with home readings.  Ranging anywhere from 832P to 498Y systolic.  Past Medical History:  Diagnosis Date  . Asthma   . Diabetes mellitus without complication ([place])   . Hypertension     Past Surgical History:  Procedure Laterality Date  . LITHOTRIPSY      Current Medications: Current Meds  Medication Sig  . amLODipine (NORVASC) 10 MG tablet Take 1 tablet (10 mg total) by mouth daily.  Marland Kitchen aspirin 81 MG chewable tablet Chew 81 mg by mouth every other day.   . blood glucose meter kit and supplies Dispense based on patient and insurance preference. Use up to four times daily as directed. (FOR ICD-10 E10.9, E11.9).  . carvedilol  (COREG) 25 MG tablet Take 1 tablet (25 mg total) by mouth 2 (two) times daily with a meal.  . Cholecalciferol (VITAMIN D-3) 125 MCG (5000 UT) TABS Take 1 tablet by mouth daily.  . Dulaglutide (TRULICITY) 1.5 ME/1.5AX SOPN Inject 1.5 mg into the skin every 7 (seven) days.  . hydrALAZINE (APRESOLINE) 50 MG tablet Take 1 tablet (50 mg total) by mouth 2 (two) times daily.  Marland Kitchen losartan (COZAAR) 50 MG tablet Take 1 tablet (50 mg) by mouth twice daily  . rosuvastatin (CRESTOR) 10 MG tablet Take 10 mg by mouth daily.  . [DISCONTINUED] cloNIDine (CATAPRES) 0.2 MG tablet Take 0.2 mg by mouth 2 (two) times daily.  . [DISCONTINUED] losartan (COZAAR) 50 MG tablet Take 1 tablet (50 mg total) by mouth daily.     Allergies:   Penicillins   Social History   Socioeconomic History  . Marital status: Divorced    Spouse name: Not on file  . Number of children: 2  . Years of education: Not on file  . Highest education level: 9th grade  Occupational History  . Not on file  Tobacco Use  . Smoking status: Former Smoker    Packs/day: 1.00    Years: 10.00    Pack years: 10.00  . Smokeless tobacco: Never Used  Substance and Sexual Activity  . Alcohol use: Never  . Drug use: Never  . Sexual activity: Not Currently  Other  Topics Concern  . Not on file  Social History Narrative  . Not on file   Social Determinants of Health   Financial Resource Strain:   . Difficulty of Paying Living Expenses: Not on file  Food Insecurity:   . Worried About Charity fundraiser in the Last Year: Not on file  . Ran Out of Food in the Last Year: Not on file  Transportation Needs:   . Lack of Transportation (Medical): Not on file  . Lack of Transportation (Non-Medical): Not on file  Physical Activity:   . Days of Exercise per Week: Not on file  . Minutes of Exercise per Session: Not on file  Stress:   . Feeling of Stress : Not on file  Social Connections:   . Frequency of Communication with Friends and Family: Not  on file  . Frequency of Social Gatherings with Friends and Family: Not on file  . Attends Religious Services: Not on file  . Active Member of Clubs or Organizations: Not on file  . Attends Archivist Meetings: Not on file  . Marital Status: Not on file     Family History: The patient's family history includes Diabetes in her father and mother; Hypertension in her father and mother.  ROS:   Please see the history of present illness.     All other systems reviewed and are negative.  EKGs/Labs/Other Studies Reviewed:    The following studies were reviewed today:   EKG:  EKG is  ordered today.  The ekg ordered today demonstrates normal sinus rhythm, left bundle branch block, LVH per voltage criteria..  Recent Labs: 11/07/2018: ALT 9; BUN 31; Creatinine, Ser 1.17; Hemoglobin 12.5; Platelets 341.0; Potassium 4.2; Sodium 139; TSH 0.93  Recent Lipid Panel    Component Value Date/Time   CHOL 181 11/07/2018 0830   TRIG 85.0 11/07/2018 0830   HDL 76.40 11/07/2018 0830   CHOLHDL 2 11/07/2018 0830   VLDL 17.0 11/07/2018 0830   LDLCALC 87 11/07/2018 0830    Physical Exam:    VS:  BP (!) 160/90 (BP Location: Left Arm, Patient Position: Sitting, Cuff Size: Large) Comment: Has not taken BP med yet today  Pulse 95   Ht _0  (1.803 m)   Wt 246 lb 8 oz (111.8 kg)   SpO2 99%   BMI 34.38 kg/m     Wt Readings from Last 3 Encounters:  12/21/18 246 lb 8 oz (111.8 kg)  12/06/18 249 lb (112.9 kg)  11/07/18 247 lb 12.8 oz (112.4 kg)     GEN:  Well nourished, well developed in no acute distress, obese HEENT: Normal NECK: No JVD; No carotid bruits LYMPHATICS: No lymphadenopathy CARDIAC: RRR, no murmurs, rubs, gallops RESPIRATORY:  Clear to auscultation without rales, wheezing or rhonchi  ABDOMEN: Soft, non-tender, non-distended MUSCULOSKELETAL:  No edema; No deformity  SKIN: Warm and dry NEUROLOGIC:  Alert and oriented x 3 PSYCHIATRIC:  Normal affect   ASSESSMENT:     Patient's blood pressure improved according to home readings but not adequately controlled.  EKG shows LVH per voltage criteria.  1. Essential hypertension   2. LVH (left ventricular hypertrophy)    PLAN:    In order of problems listed above:  1. Increase losartan to 50 mg twice daily.  Patient encouraged to check BP daily at home.  Continue amlodipine 10 mg daily, Coreg 25 mg twice daily.  Hydralazine 50 twice daily.  Patient advised to taper of clonidine as such; take 1  tab daily for 1 week, take 1 tab every other day for 2 weeks, then stop clonidine. 2. Obtain echocardiogram to evaluate LVH, structural abnormalities likely secondary to hypertension.  Follow-up in 2 months  This note was generated in part or whole with voice recognition software. Voice recognition is usually quite accurate but there are transcription errors that can and very often do occur. I apologize for any typographical errors that were not detected and corrected.  Medication Adjustments/Labs and Tests Ordered: Current medicines are reviewed at length with the patient today.  Concerns regarding medicines are outlined above.  Orders Placed This Encounter  Procedures  . EKG 12-Lead  . ECHOCARDIOGRAM COMPLETE   Meds ordered this encounter  Medications  . losartan (COZAAR) 50 MG tablet    Sig: Take 1 tablet (50 mg) by mouth twice daily    Dispense:  180 tablet    Refill:  3    Patient Instructions  Medication Instructions:  - Your physician has recommended you make the following change in your medication:   1) Increase cozaar (losartan) 50 mg- take 1 tablet (50 mg) by mouth twice daily  2) Decrease clonidine: - take 1 tablet (0.2 mg) by mouth once daily x 7 days, then - take 1 tablet (0.2 mg) by mouth once every other day x 2 weeks, then - stop  *If you need a refill on your cardiac medications before your next appointment, please call your pharmacy*  Lab Work: - none ordered  If you have labs  (blood work) drawn today and your tests are completely normal, you will receive your results only by: Marland Kitchen MyChart Message (if you have MyChart) OR . A paper copy in the mail If you have any lab test that is abnormal or we need to change your treatment, we will call you to review the results.  Testing/Procedures: - Your physician has requested that you have an echocardiogram. Echocardiography is a painless test that uses sound waves to create images of your heart. It provides your doctor with information about the size and shape of your heart and how well your heart's chambers and valves are working. This procedure takes approximately one hour. There are no restrictions for this procedure. An IV may need to be started at the time of your test to inject an image enhancer. The technician will determine this at the beginning of your test, so please make sure to drink some fluids prior to your exam.   Follow-Up: At [facility], you and your health needs are our priority.  As part of our continuing mission to provide you with exceptional heart care, we have created designated Provider Care Teams.  These Care Teams include your primary Cardiologist (physician) and Advanced Practice Providers (APPs -  Physician Assistants and Nurse Practitioners) who all work together to provide you with the care you need, when you need it.  Your next appointment:   2 month(s)  The format for your next appointment:   In Person  Provider:   Kate Sable, MD  Other Instructions N/a   Echocardiogram An echocardiogram is a procedure that uses painless sound waves (ultrasound) to produce an image of the heart. Images from an echocardiogram can provide important information about:  Signs of coronary artery disease (CAD).  Aneurysm detection. An aneurysm is a weak or damaged part of an artery wall that bulges out from the normal force of blood pumping through the body.  Heart size and shape. Changes in the size  or  shape of the heart can be associated with certain conditions, including heart failure, aneurysm, and CAD.  Heart muscle function.  Heart valve function.  Signs of a past heart attack.  Fluid buildup around the heart.  Thickening of the heart muscle.  A tumor or infectious growth around the heart valves. Tell a health care provider about:  Any allergies you have.  All medicines you are taking, including vitamins, herbs, eye drops, creams, and over-the-counter medicines.  Any blood disorders you have.  Any surgeries you have had.  Any medical conditions you have.  Whether you are pregnant or may be pregnant. What are the risks? Generally, this is a safe procedure. However, problems may occur, including:  Allergic reaction to dye (contrast) that may be used during the procedure. What happens before the procedure? No specific preparation is needed. You may eat and drink normally. What happens during the procedure?   An IV tube may be inserted into one of your veins.  You may receive contrast through this tube. A contrast is an injection that improves the quality of the pictures from your heart.  A gel will be applied to your chest.  A wand-like tool (transducer) will be moved over your chest. The gel will help to transmit the sound waves from the transducer.  The sound waves will harmlessly bounce off of your heart to allow the heart images to be captured in real-time motion. The images will be recorded on a computer. The procedure may vary among health care providers and hospitals. What happens after the procedure?  You may return to your normal, everyday life, including diet, activities, and medicines, unless your health care provider tells you not to do that. Summary  An echocardiogram is a procedure that uses painless sound waves (ultrasound) to produce an image of the heart.  Images from an echocardiogram can provide important information about the size and  shape of your heart, heart muscle function, heart valve function, and fluid buildup around your heart.  You do not need to do anything to prepare before this procedure. You may eat and drink normally.  After the echocardiogram is completed, you may return to your normal, everyday life, unless your health care provider tells you not to do that. This information is not intended to replace advice given to you by your health care provider. Make sure you discuss any questions you have with your health care provider. Document Released: 12/18/1999 Document Revised: 04/12/2018 Document Reviewed: 01/23/2016 Elsevier Patient Education  2020 [place], Kate Sable, MD  12/21/2018 9:50 AM    [place]

## 2018-12-21 NOTE — Patient Instructions (Signed)
Medication Instructions:  - Your physician has recommended you make the following change in your medication:   1) Increase cozaar (losartan) 50 mg- take 1 tablet (50 mg) by mouth twice daily  2) Decrease clonidine: - take 1 tablet (0.2 mg) by mouth once daily x 7 days, then - take 1 tablet (0.2 mg) by mouth once every other day x 2 weeks, then - stop  *If you need a refill on your cardiac medications before your next appointment, please call your pharmacy*  Lab Work: - none ordered  If you have labs (blood work) drawn today and your tests are completely normal, you will receive your results only by: Marland Kitchen MyChart Message (if you have MyChart) OR . A paper copy in the mail If you have any lab test that is abnormal or we need to change your treatment, we will call you to review the results.  Testing/Procedures: - Your physician has requested that you have an echocardiogram. Echocardiography is a painless test that uses sound waves to create images of your heart. It provides your doctor with information about the size and shape of your heart and how well your heart's chambers and valves are working. This procedure takes approximately one hour. There are no restrictions for this procedure. An IV may need to be started at the time of your test to inject an image enhancer. The technician will determine this at the beginning of your test, so please make sure to drink some fluids prior to your exam.   Follow-Up: At [facility], you and your health needs are our priority.  As part of our continuing mission to provide you with exceptional heart care, we have created designated Provider Care Teams.  These Care Teams include your primary Cardiologist (physician) and Advanced Practice Providers (APPs -  Physician Assistants and Nurse Practitioners) who all work together to provide you with the care you need, when you need it.  Your next appointment:   2 month(s)  The format for your next appointment:    In Person  Provider:   Kate Sable, MD  Other Instructions N/a   Echocardiogram An echocardiogram is a procedure that uses painless sound waves (ultrasound) to produce an image of the heart. Images from an echocardiogram can provide important information about:  Signs of coronary artery disease (CAD).  Aneurysm detection. An aneurysm is a weak or damaged part of an artery wall that bulges out from the normal force of blood pumping through the body.  Heart size and shape. Changes in the size or shape of the heart can be associated with certain conditions, including heart failure, aneurysm, and CAD.  Heart muscle function.  Heart valve function.  Signs of a past heart attack.  Fluid buildup around the heart.  Thickening of the heart muscle.  A tumor or infectious growth around the heart valves. Tell a health care provider about:  Any allergies you have.  All medicines you are taking, including vitamins, herbs, eye drops, creams, and over-the-counter medicines.  Any blood disorders you have.  Any surgeries you have had.  Any medical conditions you have.  Whether you are pregnant or may be pregnant. What are the risks? Generally, this is a safe procedure. However, problems may occur, including:  Allergic reaction to dye (contrast) that may be used during the procedure. What happens before the procedure? No specific preparation is needed. You may eat and drink normally. What happens during the procedure?   An IV tube may be inserted  into one of your veins.  You may receive contrast through this tube. A contrast is an injection that improves the quality of the pictures from your heart.  A gel will be applied to your chest.  A wand-like tool (transducer) will be moved over your chest. The gel will help to transmit the sound waves from the transducer.  The sound waves will harmlessly bounce off of your heart to allow the heart images to be captured in  real-time motion. The images will be recorded on a computer. The procedure may vary among health care providers and hospitals. What happens after the procedure?  You may return to your normal, everyday life, including diet, activities, and medicines, unless your health care provider tells you not to do that. Summary  An echocardiogram is a procedure that uses painless sound waves (ultrasound) to produce an image of the heart.  Images from an echocardiogram can provide important information about the size and shape of your heart, heart muscle function, heart valve function, and fluid buildup around your heart.  You do not need to do anything to prepare before this procedure. You may eat and drink normally.  After the echocardiogram is completed, you may return to your normal, everyday life, unless your health care provider tells you not to do that. This information is not intended to replace advice given to you by your health care provider. Make sure you discuss any questions you have with your health care provider. Document Released: 12/18/1999 Document Revised: 04/12/2018 Document Reviewed: 01/23/2016 Elsevier Patient Education  2020 [employer].

## 2018-12-24 ENCOUNTER — Other Ambulatory Visit: Payer: Self-pay | Admitting: Internal Medicine

## 2018-12-24 ENCOUNTER — Ambulatory Visit: Payer: Medicare Other | Admitting: Pharmacist

## 2018-12-24 DIAGNOSIS — I10 Essential (primary) hypertension: Secondary | ICD-10-CM

## 2018-12-24 DIAGNOSIS — N1832 Chronic kidney disease, stage 3b: Secondary | ICD-10-CM

## 2018-12-24 DIAGNOSIS — E119 Type 2 diabetes mellitus without complications: Secondary | ICD-10-CM

## 2018-12-24 DIAGNOSIS — E782 Mixed hyperlipidemia: Secondary | ICD-10-CM

## 2018-12-24 DIAGNOSIS — R718 Other abnormality of red blood cells: Secondary | ICD-10-CM

## 2018-12-24 DIAGNOSIS — E1165 Type 2 diabetes mellitus with hyperglycemia: Secondary | ICD-10-CM

## 2018-12-24 MED ORDER — TRULICITY 1.5 MG/0.5ML ~~LOC~~ SOAJ: 3.0000 mg | SUBCUTANEOUS | 5 refills | Status: DC

## 2018-12-24 NOTE — Patient Instructions (Signed)
Visit Information  Goals Addressed            This Visit's Progress     Patient Stated   . "I want to control my diabetes" (pt-stated)       Current Barriers:  . Diabetes: uncontrolled; most recent A1c 8.0% . Current antihyperglycemic regimen: Trulicity 1.5 mg weekly - had discussed increasing to 3 mg weekly, but has not done that yet. Needs updated prescription sent in o Diagnosed in 2016  o Previously on metformin, stopped d/t kidney function decline o Stopped Jardiance d/t yeast infection coming back, even on 12.5 mg daily  . Current glucose readings:  o Does not have log with her for review, but does note fasting running in 150-170s . Cardiovascular risk reduction: o Current hypertensive regimen: carvedilol 25 mg BID, amlodipine 10 mg daily, clonidine taper; hydralazine 25 mg BID, losartan 50 mg BID; see HTN goal o Current hyperlipidemia regimen: rosuvastatin 10 mg; last LDL not at goal on lovastatin 40 mg, due for updated LDL with next lab work   Pharmacist Clinical Goal(s):  Marland Kitchen Over the next 90 days, patient with work with PharmD and primary care provider to address optimized glycemic management  Interventions: . Comprehensive medication review performed, medication list updated in electronic medical record.  . Will collaborate w/ Dr. Terese Door to send updated prescription for Trulicity 3 mg weekly to pharmacy. Patient will increase once she completes her supply of Trulicity 1.5 mg pens.  . Per previous PCP, patient was to have f/u in 3 months, around 02/2019. Will collaborate w/ Dr. Terese Door to determine if patient is going to be under her care moving forward, or if she will transition to the new provider when they start, and what she recommends for scheduling f/u appointment and lab work.   Patient Self Care Activities:  . Patient will check blood glucose BID, document, and provide at future appointments . Patient will take medications as prescribed . Patient will  report any questions or concerns to provider   Please see past updates related to this goal by clicking on the "Past Updates" button in the selected goal      . "My BP is hard to control" (pt-stated)       Current Barriers:  . Uncontrolled hypertension, complicated by DM, CKD o Follows with [facility] o Follows w/ [facility]. Garen Lah at [facility]; recent increased losartan to taper clonidine . Current antihypertensive regimen: carvedilol 25 mg BID, amlodipine 10 mg QAM, clonidine 0.1 mg BID (tapering) hydralazine 50 mg BID, losartan 50 mg BID . Previous antihypertensives tried: spironolactone d/c d/t declining renal function in 02/2018;  . Current home BP readings: does not have book with her for review; per chart review, she reported systolics [id number] to cardiologist last week. They increased losartan to 50 mg BID  Pharmacist Clinical Goal(s):  Marland Kitchen Over the next 90 days, patient will work with PharmD and providers to optimize antihypertensive regimen  Interventions: . Comprehensive medication management review performed, medication list updated in electronic medical record . Praised patient for continued focus on medication adherence and BP improvement.   Patient Self Care Activities:  . Patient will continue to check BP daily, document, and provide at future appointments . Patient will focus on medication adherence   Please see past updates related to this goal by clicking on the "Past Updates" button in the selected goal         The patient verbalized understanding of instructions provided today and declined a  print copy of patient instruction materials.  Plan: - Scheduled f/u phone call on 01/28/2019 @ 9 am  [name], PharmD, [place], [place] [phone]

## 2018-12-24 NOTE — Chronic Care Management (AMB) (Signed)
Chronic Care Management   Follow Up Note   12/24/2018 Name: Nancy Barajas MRN: [id number] DOB: Mar 22, 1960  Referred by: Jodelle Green, FNP Reason for referral : Chronic Care Management (Medication Management)   Nancy Barajas is a 58 y.o. year old female who is a primary care patient of Guse, Jacquelynn Cree, FNP. The CCM team was consulted for assistance with chronic disease management and care coordination needs.    Contacted patient for medication management review.   Review of patient status, including review of consultants reports, relevant laboratory and other test results, and collaboration with appropriate care team members and the patient's provider was performed as part of comprehensive patient evaluation and provision of chronic care management services.    SDOH (Social Determinants of Health) screening performed today: Financial Strain  Stress. See Care Plan for related entries.   Outpatient Encounter Medications as of 12/24/2018  Medication Sig Note  . amLODipine (NORVASC) 10 MG tablet Take 1 tablet (10 mg total) by mouth daily. 09/13/2018: QAM  . blood glucose meter kit and supplies Dispense based on patient and insurance preference. Use up to four times daily as directed. (FOR ICD-10 E10.9, E11.9).   . carvedilol (COREG) 25 MG tablet Take 1 tablet (25 mg total) by mouth 2 (two) times daily with a meal.   . Cholecalciferol (VITAMIN D-3) 125 MCG (5000 UT) TABS Take 1 tablet by mouth daily.   . Dulaglutide (TRULICITY) 1.5 TS/1.7BL SOPN Inject 1.5 mg into the skin every 7 (seven) days.   . hydrALAZINE (APRESOLINE) 50 MG tablet Take 1 tablet (50 mg total) by mouth 2 (two) times daily.   Marland Kitchen losartan (COZAAR) 50 MG tablet Take 1 tablet (50 mg) by mouth twice daily   . rosuvastatin (CRESTOR) 10 MG tablet Take 10 mg by mouth daily.   . [DISCONTINUED] aspirin 81 MG chewable tablet Chew 81 mg by mouth every other day.     No facility-administered encounter medications on file as of  12/24/2018.     Goals Addressed            This Visit's Progress     Patient Stated   . "I want to control my diabetes" (pt-stated)       Current Barriers:  . Diabetes: uncontrolled; most recent A1c 8.0% . Current antihyperglycemic regimen: Trulicity 1.5 mg weekly - had discussed increasing to 3 mg weekly, but has not done that yet. Needs updated prescription sent in o Diagnosed in 2016  o Previously on metformin, stopped d/t kidney function decline o Stopped Jardiance d/t yeast infection coming back, even on 12.5 mg daily  . Current glucose readings:  o Does not have log with her for review, but does note fasting running in 150-170s . Cardiovascular risk reduction: o Current hypertensive regimen: carvedilol 25 mg BID, amlodipine 10 mg daily, clonidine taper; hydralazine 25 mg BID, losartan 50 mg BID; see HTN goal o Current hyperlipidemia regimen: rosuvastatin 10 mg; last LDL not at goal on lovastatin 40 mg, due for updated LDL with next lab work   Pharmacist Clinical Goal(s):  Marland Kitchen Over the next 90 days, patient with work with PharmD and primary care provider to address optimized glycemic management  Interventions: . Comprehensive medication review performed, medication list updated in electronic medical record.  . Will collaborate w/ Dr. Terese Door to send updated prescription for Trulicity 3 mg weekly to pharmacy. Patient will increase once she completes her supply of Trulicity 1.5 mg pens.  . Per previous PCP, patient was  to have f/u in 3 months, around 02/2019. Will collaborate w/ Dr. Terese Door to determine if patient is going to be under her care moving forward, or if she will transition to the new provider when they start, and what she recommends for scheduling f/u appointment and lab work.   Patient Self Care Activities:  . Patient will check blood glucose BID, document, and provide at future appointments . Patient will take medications as prescribed . Patient will  report any questions or concerns to provider   Please see past updates related to this goal by clicking on the "Past Updates" button in the selected goal      . "My BP is hard to control" (pt-stated)       Current Barriers:  . Uncontrolled hypertension, complicated by DM, CKD o Follows with [facility] o Follows w/ Dr. Garen Lah at [facility]; recent increased losartan to taper clonidine . Current antihypertensive regimen: carvedilol 25 mg BID, amlodipine 10 mg QAM, clonidine 0.1 mg BID (tapering) hydralazine 50 mg BID, losartan 50 mg BID . Previous antihypertensives tried: spironolactone d/c d/t declining renal function in 02/2018;  . Current home BP readings: does not have book with her for review; per chart review, she reported systolics [id number] to cardiologist last week. They increased losartan to 50 mg BID  Pharmacist Clinical Goal(s):  Marland Kitchen Over the next 90 days, patient will work with PharmD and providers to optimize antihypertensive regimen  Interventions: . Comprehensive medication management review performed, medication list updated in electronic medical record . Praised patient for continued focus on medication adherence and BP improvement.   Patient Self Care Activities:  . Patient will continue to check BP daily, document, and provide at future appointments . Patient will focus on medication adherence   Please see past updates related to this goal by clicking on the "Past Updates" button in the selected goal          Plan: - Scheduled f/u phone call on 01/28/2019 @ 9 am  [name], PharmD, [place], [place] Pharmacist [place] [phone]

## 2019-01-01 ENCOUNTER — Telehealth: Payer: Self-pay

## 2019-01-01 NOTE — Telephone Encounter (Signed)
PA started through Covermymeds.   Outcome Approved today Case [id number] Status:Approved Review Type:Qty Coverage Start Date:12/02/2018 Coverage End Date:01/01/2020

## 2019-01-07 DIAGNOSIS — N2581 Secondary hyperparathyroidism of renal origin: Secondary | ICD-10-CM | POA: Diagnosis not present

## 2019-01-07 DIAGNOSIS — E1129 Type 2 diabetes mellitus with other diabetic kidney complication: Secondary | ICD-10-CM | POA: Diagnosis not present

## 2019-01-07 DIAGNOSIS — N1831 Chronic kidney disease, stage 3a: Secondary | ICD-10-CM | POA: Diagnosis not present

## 2019-01-07 DIAGNOSIS — R809 Proteinuria, unspecified: Secondary | ICD-10-CM | POA: Diagnosis not present

## 2019-01-07 DIAGNOSIS — D631 Anemia in chronic kidney disease: Secondary | ICD-10-CM | POA: Diagnosis not present

## 2019-01-07 DIAGNOSIS — I129 Hypertensive chronic kidney disease with stage 1 through stage 4 chronic kidney disease, or unspecified chronic kidney disease: Secondary | ICD-10-CM | POA: Diagnosis not present

## 2019-01-10 DIAGNOSIS — E119 Type 2 diabetes mellitus without complications: Secondary | ICD-10-CM | POA: Diagnosis not present

## 2019-01-10 LAB — HM DIABETES EYE EXAM

## 2019-01-15 ENCOUNTER — Other Ambulatory Visit: Payer: Self-pay

## 2019-01-15 ENCOUNTER — Ambulatory Visit (INDEPENDENT_AMBULATORY_CARE_PROVIDER_SITE_OTHER): Payer: Medicare Other

## 2019-01-15 ENCOUNTER — Encounter: Payer: Self-pay | Admitting: Family Medicine

## 2019-01-15 VITALS — BP 104/69 | HR 97 | Ht 71.0 in | Wt 244.0 lb

## 2019-01-15 DIAGNOSIS — Z0000 Encounter for general adult medical examination without abnormal findings: Secondary | ICD-10-CM | POA: Diagnosis not present

## 2019-01-15 NOTE — Progress Notes (Addendum)
Subjective:   Nancy Barajas is a 59 y.o. female who presents for an Initial Medicare Annual Wellness Visit.  Review of Systems    No ROS.  Medicare Wellness Virtual Visit.  Visual/audio telehealth visit, UTA vital signs.   Wt/Ht provided.  See social history for additional risk factors.  Cardiac Risk Factors include: diabetes mellitus;hypertension     Objective:    Today's Vitals   01/15/19 1308  BP: 104/69  Pulse: 97  Weight: 244 lb (110.7 kg)  Height: '5\' 11"'  (1.803 m)   Body mass index is 34.03 kg/m.  Advanced Directives 01/15/2019  Does Patient Have a Medical Advance Directive? No  Would patient like information on creating a medical advance directive? Yes (MAU/Ambulatory/Procedural Areas - Information given)    Current Medications (verified) Outpatient Encounter Medications as of 01/15/2019  Medication Sig   amLODipine (NORVASC) 10 MG tablet Take 1 tablet (10 mg total) by mouth daily.   blood glucose meter kit and supplies Dispense based on patient and insurance preference. Use up to four times daily as directed. (FOR ICD-10 E10.9, E11.9).   carvedilol (COREG) 25 MG tablet Take 1 tablet (25 mg total) by mouth 2 (two) times daily with a meal.   Cholecalciferol (VITAMIN D-3) 125 MCG (5000 UT) TABS Take 1 tablet by mouth daily.   hydrALAZINE (APRESOLINE) 50 MG tablet Take 1 tablet (50 mg total) by mouth 2 (two) times daily.   losartan (COZAAR) 50 MG tablet Take 1 tablet (50 mg) by mouth twice daily   rosuvastatin (CRESTOR) 10 MG tablet Take 10 mg by mouth daily.   [DISCONTINUED] Dulaglutide (TRULICITY) 1.5 AG/5.3MI SOPN Inject 3 mg into the skin every 7 (seven) days. 90 day supply   No facility-administered encounter medications on file as of 01/15/2019.    Allergies (verified) Penicillins   History: Past Medical History:  Diagnosis Date   Asthma    Diabetes mellitus without complication ([place])    Hypertension    Past Surgical History:  Procedure Laterality  Date   LITHOTRIPSY     Family History  Problem Relation Age of Onset   Hypertension Mother    Diabetes Mother    Hypertension Father    Diabetes Father    Social History   Socioeconomic History   Marital status: Divorced    Spouse name: Not on file   Number of children: 2   Years of education: Not on file   Highest education level: 9th grade  Occupational History   Not on file  Tobacco Use   Smoking status: Former Smoker    Packs/day: 1.00    Years: 10.00    Pack years: 10.00   Smokeless tobacco: Never Used  Substance and Sexual Activity   Alcohol use: Never   Drug use: Never   Sexual activity: Not Currently  Other Topics Concern   Not on file  Social History Narrative   Not on file   Social Determinants of Health   Financial Resource Strain: Low Risk    Difficulty of Paying Living Expenses: Not hard at all  Food Insecurity: No Food Insecurity   Worried About Charity fundraiser in the Last Year: Never true   [place] in the Last Year: Never true  Transportation Needs: No Transportation Needs   Lack of Transportation (Medical): No   Lack of Transportation (Non-Medical): No  Physical Activity: Unknown   Days of Exercise per Week: 0 days   Minutes of Exercise per Session: Not  on file  Stress: No Stress Concern Present   Feeling of Stress : Only a little  Social Connections: Unknown   Frequency of Communication with Friends and Family: Three times a week   Frequency of Social Gatherings with Friends and Family: More than three times a week   Attends Religious Services: Never   Marine scientist or Organizations: Yes   Attends Archivist Meetings: Never   Marital Status: Not on file    Tobacco Counseling Counseling given: Not Answered   Clinical Intake:  Pre-visit preparation completed: Yes        Diabetes: Yes(Followed by pcp)  How often do you need to have someone help you when you read instructions, pamphlets, or other  written materials from your doctor or pharmacy?: 1 - Never  Interpreter Needed?: No      Activities of Daily Living In your present state of health, do you have any difficulty performing the following activities: 01/15/2019 11/07/2018  Hearing? N N  Vision? N N  Difficulty concentrating or making decisions? N N  Walking or climbing stairs? N N  Dressing or bathing? N N  Doing errands, shopping? N N  Preparing Food and eating ? N -  Using the Toilet? N -  In the past six months, have you accidently leaked urine? N -  Do you have problems with loss of bowel control? N -  Managing your Medications? N -  Managing your Finances? N -  Housekeeping or managing your Housekeeping? N -  Some recent data might be hidden     Immunizations and Health Maintenance Immunization History  Administered Date(s) Administered   Influenza,inj,Quad PF,6+ Mos 10/10/2017, 11/07/2018   Health Maintenance Due  Topic Date Due   HIV Screening  11/03/1974   MAMMOGRAM  11/02/2009   COLONOSCOPY  11/02/2009    Patient Care Team: McLean-Scocuzza, Nino Glow, MD as PCP - General (Internal Medicine) Kate Sable, MD as PCP - Cardiology (Cardiology) De Hollingshead, [facility] as Pharmacist (Pharmacist)  Indicate any recent Medical Services you may have received from other than [facility] providers in the past year (date may be approximate).     Assessment:   This is a routine wellness examination for Nancy Barajas.  Nurse connected with patient 01/20/19 at  1:00 PM EST by a telephone enabled telemedicine application and verified that I am speaking with the correct person using two identifiers. Patient stated full name and DOB. Patient gave permission to continue with virtual visit. Patient's location was at home and Nurse's location was at [place] office.   Patient is alert and oriented x3. Patient denies difficulty focusing or concentrating. Patient likes to play games for brain stimulation.   Health  Maintenance Due: -Colonoscopy- plans to discuss with pcp at next visit.  -Hgb A1c- 11/07/18 (8.0) See completed HM at the end of note.   Eye: Visual acuity not assessed. Virtual visit. Followed by their ophthalmologist. Retinopathy- none reported.  Dental: UTD.    Hearing: Demonstrates normal hearing during visit.  Safety:  Patient feels safe at home- yes Patient does have smoke detectors at home- yes Patient does wear sunscreen or protective clothing when in direct sunlight - yes Patient does wear seat belt when in a moving vehicle - yes Patient drives- yes Adequate lighting in walkways free from debris- yes Grab bars and handrails used as appropriate- yes Ambulates with an assistive device- no Cell phone on person when ambulating outside of the home- yes  Social: Alcohol intake -  no      Smoking history- former  Smokers in home? none Illicit drug use? none  Medication: Taking as directed and without issues.  Pill box in use -yes  Self managed - yes   Covid-19: Precautions and sickness symptoms discussed. Wears mask, social distancing, hand hygiene as appropriate.   Activities of Daily Living Patient denies needing assistance with: household chores, feeding themselves, getting from bed to chair, getting to the toilet, bathing/showering, dressing, managing money, or preparing meals.   Discussed the importance of a healthy diet, water intake and the benefits of aerobic exercise.   Physical activity- No routine. Encouraged to stay active.   Diet:  Regular Water: good intake Caffeine: soda or coffee every once in awhile   Other Providers Patient Care Team: McLean-Scocuzza, Nino Glow, MD as PCP - General (Internal Medicine) Kate Sable, MD as PCP - Cardiology (Cardiology) De Hollingshead, [facility] as Pharmacist (Pharmacist)  Hearing/Vision screen  Hearing Screening   '125Hz'  '250Hz'  '500Hz'  '1000Hz'  '2000Hz'  '3000Hz'  '4000Hz'  '6000Hz'  '8000Hz'   Right ear:           Left ear:            Comments: Patient is able to hear conversational tones without difficulty.  No issues reported.   Vision Screening Comments: Wears corrective lenses Visual acuity not assessed, virtual visit.  They have seen their ophthalmologist in the last 12 months.     Dietary issues and exercise activities discussed: Current Exercise Habits: The patient does not participate in regular exercise at present  Goals      "I want to control my diabetes" (pt-stated)     Current Barriers:  Diabetes: uncontrolled; most recent A1c 8.0% Current antihyperglycemic regimen: Trulicity 1.5 mg weekly - had discussed increasing to 3 mg weekly, but has not done that yet. Needs updated prescription sent in Diagnosed in 2016  Previously on metformin, stopped d/t kidney function decline Stopped Jardiance d/t yeast infection coming back, even on 12.5 mg daily  Current glucose readings:  Does not have log with her for review, but does note fasting running in 150-170s Cardiovascular risk reduction: Current hypertensive regimen: carvedilol 25 mg BID, amlodipine 10 mg daily, clonidine taper; hydralazine 25 mg BID, losartan 50 mg BID; see HTN goal Current hyperlipidemia regimen: rosuvastatin 10 mg; last LDL not at goal on lovastatin 40 mg, due for updated LDL with next lab work   Pharmacist Clinical Goal(s):  Over the next 90 days, patient with work with PharmD and primary care provider to address optimized glycemic management  Interventions: Comprehensive medication review performed, medication list updated in electronic medical record.  Will collaborate w/ Dr. Terese Door to send updated prescription for Trulicity 3 mg weekly to pharmacy. Patient will increase once she completes her supply of Trulicity 1.5 mg pens.  Per previous PCP, patient was to have f/u in 3 months, around 02/2019. Will collaborate w/ Dr. Terese Door to determine if patient is going to be under her care moving forward, or if she will  transition to the new provider when they start, and what she recommends for scheduling f/u appointment and lab work.   Patient Self Care Activities:  Patient will check blood glucose BID, document, and provide at future appointments Patient will take medications as prescribed Patient will report any questions or concerns to provider   Please see past updates related to this goal by clicking on the "Past Updates" button in the selected goal       "My BP is hard  to control" (pt-stated)     Current Barriers:  Uncontrolled hypertension, complicated by DM, CKD Follows with [place] w/ Dr. Garen Lah at [facility]; recent increased losartan to taper clonidine Current antihypertensive regimen: carvedilol 25 mg BID, amlodipine 10 mg QAM, clonidine 0.1 mg BID (tapering) hydralazine 50 mg BID, losartan 50 mg BID Previous antihypertensives tried: spironolactone d/c d/t declining renal function in 02/2018;  Current home BP readings: does not have book with her for review; per chart review, she reported systolics [id number] to cardiologist last week. They increased losartan to 50 mg BID  Pharmacist Clinical Goal(s):  Over the next 90 days, patient will work with PharmD and providers to optimize antihypertensive regimen  Interventions: Comprehensive medication management review performed, medication list updated in electronic medical record Praised patient for continued focus on medication adherence and BP improvement.   Patient Self Care Activities:  Patient will continue to check BP daily, document, and provide at future appointments Patient will focus on medication adherence   Please see past updates related to this goal by clicking on the "Past Updates" button in the selected goal         Depression Screen PHQ 2/9 Scores 01/15/2019 11/07/2018  PHQ - 2 Score - 3  PHQ- 9 Score - 10  Exception Documentation (No Data) -    Fall Risk Fall Risk  01/15/2019 11/07/2018  Falls in  the past year? 0 0  Follow up Falls prevention discussed Falls evaluation completed    Timed Get Up and Go Performed: No, virtual visit  Cognitive Function:     6CIT Screen 01/15/2019  What Year? 0 points  What month? 0 points  What time? 0 points  Count back from 20 0 points  Months in reverse 0 points  Repeat phrase 0 points  Total Score 0    Screening Tests Health Maintenance  Topic Date Due   HIV Screening  11/03/1974   MAMMOGRAM  11/02/2009   COLONOSCOPY  11/02/2009   PNEUMOCOCCAL POLYSACCHARIDE VACCINE AGE 54-64 HIGH RISK  08/07/2019 (Originally 11/02/1962)   Hepatitis C Screening  08/07/2019 (Originally [birth year])   TETANUS/TDAP  01/15/2020 (Originally 11/03/1978)   FOOT EXAM  02/06/2019   HEMOGLOBIN A1C  05/07/2019   OPHTHALMOLOGY EXAM  01/10/2020   PAP SMEAR-Modifier  02/05/2021   INFLUENZA VACCINE  Completed     Plan:   Keep all routine maintenance appointments.   Follow up 01/28/19 @ 9:00 CCM  Next scheduled lab 02/20/19 @ 9:00  Follow up 03/01/19 @ 1130  Medicare Attestation I have personally reviewed: The patient's medical and social history Their use of alcohol, tobacco or illicit drugs Their current medications and supplements The patient's functional ability including ADLs,fall risks, home safety risks, cognitive, and hearing and visual impairment Diet and physical activities Evidence for depression   I have reviewed and discussed with patient certain preventive protocols, quality metrics, and best practice recommendations.     Increased trulicity was sent in 3 mg dose 12/24/18 as well as labs placed. Will resent again  Not sure why pt has not increased to 3 mg 1x per week dose of trulicity yet as was sent 12/24/18  Fasting labs scheduled and orders in since 12/24/18  F/u with Me  Will get catie to look into trulicity   [place]  Nino Glow McLean-Scocuzza, MD   01/20/2019   This is Chauntel Windsor O'Brien's note I just added the bolded comments at the end  and cosigned   [place]

## 2019-01-15 NOTE — Patient Instructions (Addendum)
[name] , Thank you for taking time to come for your Medicare Wellness Visit. I appreciate your ongoing commitment to your health goals. Please review the following plan we discussed and let me know if I can assist you in the future.   These are the goals we discussed: Goals      Patient Stated   . "I want to control my diabetes" (pt-stated)     Current Barriers:  . Diabetes: uncontrolled; most recent A1c 8.0% . Current antihyperglycemic regimen: Trulicity 1.5 mg weekly - had discussed increasing to 3 mg weekly, but has not done that yet. Needs updated prescription sent in o Diagnosed in 2016  o Previously on metformin, stopped d/t kidney function decline o Stopped Jardiance d/t yeast infection coming back, even on 12.5 mg daily  . Current glucose readings:  o Does not have log with her for review, but does note fasting running in 150-170s . Cardiovascular risk reduction: o Current hypertensive regimen: carvedilol 25 mg BID, amlodipine 10 mg daily, clonidine taper; hydralazine 25 mg BID, losartan 50 mg BID; see HTN goal o Current hyperlipidemia regimen: rosuvastatin 10 mg; last LDL not at goal on lovastatin 40 mg, due for updated LDL with next lab work   Pharmacist Clinical Goal(s):  Marland Kitchen Over the next 90 days, patient with work with PharmD and primary care provider to address optimized glycemic management  Interventions: . Comprehensive medication review performed, medication list updated in electronic medical record.  . Will collaborate w/ Dr. Terese Door to send updated prescription for Trulicity 3 mg weekly to pharmacy. Patient will increase once she completes her supply of Trulicity 1.5 mg pens.  . Per previous PCP, patient was to have f/u in 3 months, around 02/2019. Will collaborate w/ Dr. Terese Door to determine if patient is going to be under her care moving forward, or if she will transition to the new provider when they start, and what she recommends for scheduling f/u  appointment and lab work.   Patient Self Care Activities:  . Patient will check blood glucose BID, document, and provide at future appointments . Patient will take medications as prescribed . Patient will report any questions or concerns to provider   Please see past updates related to this goal by clicking on the "Past Updates" button in the selected goal      . "My BP is hard to control" (pt-stated)     Current Barriers:  . Uncontrolled hypertension, complicated by DM, CKD o Follows with [facility] o Follows w/ [facility]. Garen [facility] at [facility]; recent increased losartan to taper clonidine . Current antihypertensive regimen: carvedilol 25 mg BID, amlodipine 10 mg QAM, clonidine 0.1 mg BID (tapering) hydralazine 50 mg BID, losartan 50 mg BID . Previous antihypertensives tried: spironolactone d/c d/t declining renal function in 02/2018;  . Current home BP readings: does not have book with her for review; per chart review, she reported systolics [id number] to cardiologist last week. They increased losartan to 50 mg BID  Pharmacist Clinical Goal(s):  Marland Kitchen Over the next 90 days, patient will work with PharmD and providers to optimize antihypertensive regimen  Interventions: . Comprehensive medication management review performed, medication list updated in electronic medical record . Praised patient for continued focus on medication adherence and BP improvement.   Patient Self Care Activities:  . Patient will continue to check BP daily, document, and provide at future appointments . Patient will focus on medication adherence   Please see past updates related to this goal  by clicking on the "Past Updates" button in the selected goal         This is a list of the screening recommended for you and due dates:  Health Maintenance  Topic Date Due  . Eye exam for diabetics  11/02/1969  . HIV Screening  11/03/1974  . Mammogram  11/02/2009  . Colon Cancer Screening  11/02/2009  .  Pneumococcal vaccine  08/07/2019*  .  Hepatitis C: One time screening is recommended by Center for Disease Control  (CDC) for  adults born from [birth year] through 1966.   08/07/2019*  . Tetanus Vaccine  01/15/2020*  . Complete foot exam   02/06/2019  . Hemoglobin A1C  05/07/2019  . Pap Smear  02/05/2021  . Flu Shot  Completed  *Topic was postponed. The date shown is not the original due date.

## 2019-01-18 ENCOUNTER — Other Ambulatory Visit: Payer: Self-pay | Admitting: Cardiology

## 2019-01-18 DIAGNOSIS — I517 Cardiomegaly: Secondary | ICD-10-CM

## 2019-01-20 ENCOUNTER — Other Ambulatory Visit: Payer: Self-pay | Admitting: Internal Medicine

## 2019-01-20 DIAGNOSIS — E119 Type 2 diabetes mellitus without complications: Secondary | ICD-10-CM

## 2019-01-20 MED ORDER — TRULICITY 1.5 MG/0.5ML ~~LOC~~ SOAJ: 3.0000 mg | SUBCUTANEOUS | 5 refills | Status: DC

## 2019-01-21 ENCOUNTER — Other Ambulatory Visit: Payer: Self-pay | Admitting: Internal Medicine

## 2019-01-21 DIAGNOSIS — E119 Type 2 diabetes mellitus without complications: Secondary | ICD-10-CM

## 2019-01-21 MED ORDER — TRULICITY 3 MG/0.5ML ~~LOC~~ SOAJ: 3.0000 mg | SUBCUTANEOUS | 5 refills | Status: DC

## 2019-01-22 ENCOUNTER — Other Ambulatory Visit: Payer: Medicare Other

## 2019-01-22 ENCOUNTER — Telehealth: Payer: Self-pay | Admitting: Cardiology

## 2019-01-22 NOTE — Telephone Encounter (Signed)
Please reschedule for at least 14 days with no symptoms after the patients COVID exposure

## 2019-01-22 NOTE — Telephone Encounter (Signed)
    COVID-19 Pre-Screening Questions:  . In the past 7 to 10 days have you had a cough,  shortness of breath, headache, congestion, fever (100 or greater) body aches, chills, sore throat, or sudden loss of taste or sense of smell? NO . Have you been around anyone with known Covid 19. YES with PPE . Have you been around anyone who is awaiting Covid 19 test results in the past 7 to 10 days? NO . Have you been around anyone who has been exposed to Covid 19, or has mentioned symptoms of Covid 19 within the past 7 to 10 days? YES  If you have any concerns/questions about symptoms patients report during screening (either on the phone or at threshold). Contact the provider seeing the patient or DOD for further guidance.  If neither are available contact a member of the leadership team.  Spoke with Nancy Barajas and advised to reschedule.   Patient in medical mall at screening but will call to reschedule.     Please advise on time frame to reschedule. Positive exposure last week but with PPE

## 2019-01-23 DIAGNOSIS — Z20828 Contact with and (suspected) exposure to other viral communicable diseases: Secondary | ICD-10-CM | POA: Diagnosis not present

## 2019-01-28 ENCOUNTER — Ambulatory Visit (INDEPENDENT_AMBULATORY_CARE_PROVIDER_SITE_OTHER): Payer: Medicare Other | Admitting: Pharmacist

## 2019-01-28 DIAGNOSIS — E119 Type 2 diabetes mellitus without complications: Secondary | ICD-10-CM | POA: Diagnosis not present

## 2019-01-28 DIAGNOSIS — I10 Essential (primary) hypertension: Secondary | ICD-10-CM

## 2019-01-28 NOTE — Chronic Care Management (AMB) (Signed)
Chronic Care Management   Follow Up Note   01/28/2019 Name: Nancy Barajas MRN: [id number] DOB: [birth year]  Referred by: McLean-Scocuzza, Nino Glow, MD Reason for referral : Chronic Care Management (Medication Management)   [name] is a 59 y.o. year old female who is a primary care patient of McLean-Scocuzza, Nino Glow, MD. The CCM team was consulted for assistance with chronic disease management and care coordination needs.   Contacted patient for medication management review today.   Review of patient status, including review of consultants reports, relevant laboratory and other test results, and collaboration with appropriate care team members and the patient's provider was performed as part of comprehensive patient evaluation and provision of chronic care management services.    SDOH (Social Determinants of Health) screening performed today: Financial Strain  Food Insecurity  Stress. See Care Plan for related entries.   Outpatient Encounter Medications as of 01/28/2019  Medication Sig Note  . amLODipine (NORVASC) 10 MG tablet Take 1 tablet (10 mg total) by mouth daily. 09/13/2018: QAM  . blood glucose meter kit and supplies Dispense based on patient and insurance preference. Use up to four times daily as directed. (FOR ICD-10 E10.9, E11.9).   . carvedilol (COREG) 25 MG tablet Take 1 tablet (25 mg total) by mouth 2 (two) times daily with a meal.   . Cholecalciferol (VITAMIN D-3) 125 MCG (5000 UT) TABS Take 1 tablet by mouth daily.   . Dulaglutide (TRULICITY) 3 OE/3.2ZY SOPN Inject 3 mg into the skin once a week.   . hydrALAZINE (APRESOLINE) 50 MG tablet Take 1 tablet (50 mg total) by mouth 2 (two) times daily.   Marland Kitchen losartan (COZAAR) 50 MG tablet Take 1 tablet (50 mg) by mouth twice daily   . rosuvastatin (CRESTOR) 10 MG tablet Take 10 mg by mouth daily.    No facility-administered encounter medications on file as of 01/28/2019.     Objective:   Goals Addressed            This Visit's Progress     Patient Stated   . "I want to control my diabetes" (pt-stated)       Current Barriers:  . Diabetes: uncontrolled; most recent A1c 8.0% . Current antihyperglycemic regimen: Trulicity 3 mg weekly o Diagnosed in 2016  o Previously on metformin, stopped d/t kidney function decline o Stopped Jardiance d/t yeast infection coming back, even on 12.5 mg daily  . Current meal patterns: o Breakfast: leftovers from dinner (maybe chicken, macaroni); sometimes eggs, grits, bacon;  o Lunch: Chicken, pasta  o Dinner: Meat + starch; sometimes sandwich (deli sandwich) + chips (pringles) o Drinks: mostly water; occasional soda but trying to decrease how much soda she's drinking o Desserts: occasional sweets, piece of cake/cookie  . Current glucose readings:   Fasting  1-Jan 137  2-Jan 149  4-Jan 159  5-Jan 156  7-Jan 138  8-Jan 178  10-Jan 147  11-Jan 165  12-Jan 169  13-Jan 154  15-Jan 149  16-Jan 132  17-Jan 139  18-Jan   19-Jan 131  20-Jan 159  21-Jan 156  23-Jan 132  24-Jan 134  Average 149   . Cardiovascular risk reduction: o Current hypertensive regimen: carvedilol 25 mg BID, amlodipine 10 mg daily, hydralazine 50 mg BID, losartan 50 mg BID; see HTN goal o Current hyperlipidemia regimen: rosuvastatin 10 mg; due for updated LDL with next lab work   Pharmacist Clinical Goal(s):  Marland Kitchen Over the next 90 days, patient with work with PharmD  and primary care provider to address optimized glycemic management  Interventions: . Comprehensive medication review performed, medication list updated in electronic medical record. . Extensive dietary discussion. Reviewed importance of choosing lower carbohydrate options and controlling portion sizes.  . Discussed lack of vegetables in her diet. She reports having a difficult time affording healthy foods sometimes. Notes that she still has food Glass blower/designer contact information given to her by Care Guide, but that her  depression/anxiety has made it difficult for her to go to these places. Notes she was previously connected w/ Debbe Bales, but that she isn't sure the follow up plan with him, as she hasn't heard from him in a while. I will message him to investigate.  Marland Kitchen Pending A1c result, may need to consider daily basal insulin moving forward to help w/ fasting sugars, as patient was unable to tolerate SGLT2 and renal function limits metformin use.   Patient Self Care Activities:  . Patient will check blood glucose BID, document, and provide at future appointments . Patient will take medications as prescribed . Patient will report any questions or concerns to provider   Please see past updates related to this goal by clicking on the "Past Updates" button in the selected goal      . "My BP is hard to control" (pt-stated)       Current Barriers:  . Uncontrolled hypertension, complicated by DM, CKD o Follows with [facility] o Follows w/ [facility] G[facility] at [facility];  . Current antihypertensive regimen: carvedilol 25 mg BID, amlodipine 10 mg QAM, hydralazine 50 mg BID, losartan 50 mg BID . Previous antihypertensives tried: spironolactone d/c d/t declining renal function in 02/2018; clonidine . Current home BP readings:   SBP DBP HR  1-Jan 134 94 99  2-Jan 134 98 85  4-Jan 121 85 100  5-Jan 111 81 98  7-Jan     8-Jan 128 87 94  10-Jan 120 89 84  11-Jan 141 82 87  12-Jan 104 69 97  13-Jan 135 91 82  15-Jan 113 82 94  16-Jan 123 79 82  17-Jan 127 87 90  18-Jan 135 99 90  19-Jan 123 84 96  20-Jan 130 91 93  21-Jan 114 80 106  23-Jan 132 97 80  24-Jan 153 103 82  Average 127 88 91    Pharmacist Clinical Goal(s):  Marland Kitchen Over the next 90 days, patient will work with PharmD and providers to optimize antihypertensive regimen  Interventions: . Comprehensive medication management review performed, medication list updated in electronic medical record . Praised for continued BP focus. Elevated  HR/BP occurences may be tied to anxiety.   Patient Self Care Activities:  . Patient will continue to check BP daily, document, and provide at future appointments . Patient will focus on medication adherence   Please see past updates related to this goal by clicking on the "Past Updates" button in the selected goal          Plan:  - Scheduled f/u call 03/18/19  [name], PharmD, BCACP, [place] Pharmacist [place] [phone]

## 2019-01-28 NOTE — Patient Instructions (Signed)
Visit Information  Goals Addressed            This Visit's Progress     Patient Stated   . "I want to control my diabetes" (pt-stated)       Current Barriers:  . Diabetes: uncontrolled; most recent A1c 8.0% . Current antihyperglycemic regimen: Trulicity 3 mg weekly o Diagnosed in 2016  o Previously on metformin, stopped d/t kidney function decline o Stopped Jardiance d/t yeast infection coming back, even on 12.5 mg daily  . Current meal patterns: o Breakfast: leftovers from dinner (maybe chicken, macaroni); sometimes eggs, grits, bacon;  o Lunch: Chicken, pasta  o Dinner: Meat + starch; sometimes sandwich (deli sandwich) + chips (pringles) o Drinks: mostly water; occasional soda but trying to decrease how much soda she's drinking o Desserts: occasional sweets, piece of cake/cookie  . Current glucose readings:   Fasting  1-Jan 137  2-Jan 149  4-Jan 159  5-Jan 156  7-Jan 138  8-Jan 178  10-Jan 147  11-Jan 165  12-Jan 169  13-Jan 154  15-Jan 149  16-Jan 132  17-Jan 139  18-Jan   19-Jan 131  20-Jan 159  21-Jan 156  23-Jan 132  24-Jan 134  Average 149   . Cardiovascular risk reduction: o Current hypertensive regimen: carvedilol 25 mg BID, amlodipine 10 mg daily, hydralazine 50 mg BID, losartan 50 mg BID; see HTN goal o Current hyperlipidemia regimen: rosuvastatin 10 mg; due for updated LDL with next lab work   Pharmacist Clinical Goal(s):  Marland Kitchen Over the next 90 days, patient with work with PharmD and primary care provider to address optimized glycemic management  Interventions: . Comprehensive medication review performed, medication list updated in electronic medical record. . Extensive dietary discussion. Reviewed importance of choosing lower carbohydrate options and controlling portion sizes.  . Discussed lack of vegetables in her diet. She reports having a difficult time affording healthy foods sometimes. Notes that she still has food Glass blower/designer contact  information given to her by Care [employer], but that her depression/anxiety has made it difficult for her to go to these places. Notes she was previously connected w/ Debbe Bales, but that she isn't sure the follow up plan with him, as she hasn't heard from him in a while. I will message him to investigate.  Marland Kitchen Pending A1c result, may need to consider daily basal insulin moving forward to help w/ fasting sugars, as patient was unable to tolerate SGLT2 and renal function limits metformin use.   Patient Self Care Activities:  . Patient will check blood glucose BID, document, and provide at future appointments . Patient will take medications as prescribed . Patient will report any questions or concerns to provider   Please see past updates related to this goal by clicking on the "Past Updates" button in the selected goal      . "My BP is hard to control" (pt-stated)       Current Barriers:  . Uncontrolled hypertension, complicated by DM, CKD o Follows with [facility] o Follows w/ Dr. Garen Lah at [facility];  . Current antihypertensive regimen: carvedilol 25 mg BID, amlodipine 10 mg QAM, hydralazine 50 mg BID, losartan 50 mg BID . Previous antihypertensives tried: spironolactone d/c d/t declining renal function in 02/2018; clonidine . Current home BP readings:   SBP DBP HR  1-Jan 134 94 99  2-Jan 134 98 85  4-Jan 121 85 100  5-Jan 111 81 98  7-Jan     8-Jan 128 87  94  10-Jan 120 89 84  11-Jan 141 82 87  12-Jan 104 69 97  13-Jan 135 91 82  15-Jan 113 82 94  16-Jan 123 79 82  17-Jan 127 87 90  18-Jan 135 99 90  19-Jan 123 84 96  20-Jan 130 91 93  21-Jan 114 80 106  23-Jan 132 97 80  24-Jan 153 103 82  Average 127 88 91    Pharmacist Clinical Goal(s):  Marland Kitchen Over the next 90 days, patient will work with PharmD and providers to optimize antihypertensive regimen  Interventions: . Comprehensive medication management review performed, medication list updated in electronic medical  record . Praised for continued BP focus. Elevated HR/BP occurences may be tied to anxiety.   Patient Self Care Activities:  . Patient will continue to check BP daily, document, and provide at future appointments . Patient will focus on medication adherence   Please see past updates related to this goal by clicking on the "Past Updates" button in the selected goal         The patient verbalized understanding of instructions provided today and declined a print copy of patient instruction materials.  Plan:  - Scheduled f/u call 03/18/19  [name], PharmD, BCACP, [place] Pharmacist [place] [phone]

## 2019-02-12 ENCOUNTER — Ambulatory Visit: Payer: Medicare Other

## 2019-02-12 ENCOUNTER — Other Ambulatory Visit: Payer: Self-pay

## 2019-02-14 ENCOUNTER — Other Ambulatory Visit: Payer: Medicare Other

## 2019-02-14 ENCOUNTER — Other Ambulatory Visit: Payer: Self-pay

## 2019-02-14 DIAGNOSIS — I10 Essential (primary) hypertension: Secondary | ICD-10-CM

## 2019-02-14 MED ORDER — AMLODIPINE BESYLATE 10 MG PO TABS: 10.0000 mg | ORAL_TABLET | Freq: Every day | ORAL | 3 refills | Status: DC

## 2019-02-18 ENCOUNTER — Telehealth: Payer: Self-pay | Admitting: Internal Medicine

## 2019-02-18 ENCOUNTER — Other Ambulatory Visit: Payer: Self-pay

## 2019-02-18 NOTE — Telephone Encounter (Signed)
Patient is requesting to have her amLODipine (NORVASC) 10 MG tablet.

## 2019-02-18 NOTE — Telephone Encounter (Signed)
Refill already completed.

## 2019-02-20 ENCOUNTER — Other Ambulatory Visit: Payer: Self-pay

## 2019-02-20 ENCOUNTER — Other Ambulatory Visit (INDEPENDENT_AMBULATORY_CARE_PROVIDER_SITE_OTHER): Payer: Medicare Other

## 2019-02-20 DIAGNOSIS — N1832 Chronic kidney disease, stage 3b: Secondary | ICD-10-CM

## 2019-02-20 DIAGNOSIS — R718 Other abnormality of red blood cells: Secondary | ICD-10-CM

## 2019-02-20 DIAGNOSIS — E1165 Type 2 diabetes mellitus with hyperglycemia: Secondary | ICD-10-CM

## 2019-02-20 DIAGNOSIS — I10 Essential (primary) hypertension: Secondary | ICD-10-CM | POA: Diagnosis not present

## 2019-02-20 LAB — CBC WITH DIFFERENTIAL/PLATELET
Basophils Absolute: 0.1 10*3/uL (ref 0.0–0.1)
Basophils Relative: 0.9 % (ref 0.0–3.0)
Eosinophils Absolute: 0.1 10*3/uL (ref 0.0–0.7)
Eosinophils Relative: 1 % (ref 0.0–5.0)
HCT: 37.3 % (ref 36.0–46.0)
Hemoglobin: 11.8 g/dL — ABNORMAL LOW (ref 12.0–15.0)
Lymphocytes Relative: 29.7 % (ref 12.0–46.0)
Lymphs Abs: 2 10*3/uL (ref 0.7–4.0)
MCHC: 31.5 g/dL (ref 30.0–36.0)
MCV: 70.9 fl — ABNORMAL LOW (ref 78.0–100.0)
Monocytes Absolute: 0.4 10*3/uL (ref 0.1–1.0)
Monocytes Relative: 6.8 % (ref 3.0–12.0)
Neutro Abs: 4.1 10*3/uL (ref 1.4–7.7)
Neutrophils Relative %: 61.6 % (ref 43.0–77.0)
Platelets: 320 10*3/uL (ref 150.0–400.0)
RBC: 5.27 Mil/uL — ABNORMAL HIGH (ref 3.87–5.11)
RDW: 16.5 % — ABNORMAL HIGH (ref 11.5–15.5)
WBC: 6.7 10*3/uL (ref 4.0–10.5)

## 2019-02-20 LAB — LIPID PANEL
Cholesterol: 160 mg/dL (ref 0–200)
HDL: 79.7 mg/dL (ref >39.00)
LDL Cholesterol: 67 mg/dL (ref 0–99)
NonHDL: 79.89
Total CHOL/HDL Ratio: 2
Triglycerides: 63 mg/dL (ref 0.0–149.0)
VLDL: 12.6 mg/dL (ref 0.0–40.0)

## 2019-02-20 LAB — COMPREHENSIVE METABOLIC PANEL
ALT: 11 U/L (ref 0–35)
AST: 11 U/L (ref 0–37)
Albumin: 4.2 g/dL (ref 3.5–5.2)
Alkaline Phosphatase: 74 U/L (ref 39–117)
BUN: 28 mg/dL — ABNORMAL HIGH (ref 6–23)
CO2: 30 mEq/L (ref 19–32)
Calcium: 9.4 mg/dL (ref 8.4–10.5)
Chloride: 103 mEq/L (ref 96–112)
Creatinine, Ser: 1.12 mg/dL (ref 0.40–1.20)
GFR: 49.74 mL/min — ABNORMAL LOW (ref >60.00)
Glucose, Bld: 156 mg/dL — ABNORMAL HIGH (ref 70–99)
Potassium: 4.1 mEq/L (ref 3.5–5.1)
Sodium: 138 mEq/L (ref 135–145)
Total Bilirubin: 0.6 mg/dL (ref 0.2–1.2)
Total Protein: 7.6 g/dL (ref 6.0–8.3)

## 2019-02-20 LAB — HEMOGLOBIN A1C: Hgb A1c MFr Bld: 8.6 % — ABNORMAL HIGH (ref 4.6–6.5)

## 2019-02-21 ENCOUNTER — Other Ambulatory Visit: Payer: Self-pay | Admitting: Internal Medicine

## 2019-02-21 DIAGNOSIS — R319 Hematuria, unspecified: Secondary | ICD-10-CM

## 2019-02-21 LAB — URINALYSIS, ROUTINE W REFLEX MICROSCOPIC
Bacteria, UA: NONE SEEN /HPF
Bilirubin Urine: NEGATIVE
Glucose, UA: NEGATIVE
Hyaline Cast: NONE SEEN /LPF
Ketones, ur: NEGATIVE
Nitrite: NEGATIVE
Specific Gravity, Urine: 1.017 (ref 1.001–1.03)
pH: 5.5 (ref 5.0–8.0)

## 2019-02-21 LAB — IRON,TIBC AND FERRITIN PANEL
%SAT: 19 % (calc) (ref 16–45)
Ferritin: 48 ng/mL (ref 16–232)
Iron: 54 ug/dL (ref 45–160)
TIBC: 289 mcg/dL (calc) (ref 250–450)

## 2019-02-21 LAB — MICROALBUMIN / CREATININE URINE RATIO
Creatinine, Urine: 104 mg/dL (ref 20–275)
Microalb Creat Ratio: 277 mcg/mg creat — ABNORMAL HIGH (ref <30)
Microalb, Ur: 28.8 mg/dL

## 2019-02-22 ENCOUNTER — Ambulatory Visit: Payer: Medicare Other | Admitting: Cardiology

## 2019-02-24 ENCOUNTER — Other Ambulatory Visit: Payer: Self-pay | Admitting: Internal Medicine

## 2019-02-24 DIAGNOSIS — E119 Type 2 diabetes mellitus without complications: Secondary | ICD-10-CM

## 2019-02-24 DIAGNOSIS — I10 Essential (primary) hypertension: Secondary | ICD-10-CM

## 2019-02-24 DIAGNOSIS — E782 Mixed hyperlipidemia: Secondary | ICD-10-CM

## 2019-02-24 DIAGNOSIS — N183 Chronic kidney disease, stage 3 unspecified: Secondary | ICD-10-CM

## 2019-02-24 DIAGNOSIS — E1122 Type 2 diabetes mellitus with diabetic chronic kidney disease: Secondary | ICD-10-CM

## 2019-02-26 ENCOUNTER — Other Ambulatory Visit: Payer: Medicare Other

## 2019-02-26 ENCOUNTER — Other Ambulatory Visit: Payer: Self-pay

## 2019-02-26 DIAGNOSIS — R319 Hematuria, unspecified: Secondary | ICD-10-CM | POA: Diagnosis not present

## 2019-02-27 LAB — URINE CULTURE
MICRO NUMBER:: 10178156
SPECIMEN QUALITY:: ADEQUATE

## 2019-03-01 ENCOUNTER — Encounter: Payer: Self-pay | Admitting: Internal Medicine

## 2019-03-01 ENCOUNTER — Telehealth (INDEPENDENT_AMBULATORY_CARE_PROVIDER_SITE_OTHER): Payer: Medicare Other | Admitting: Internal Medicine

## 2019-03-01 ENCOUNTER — Other Ambulatory Visit: Payer: Self-pay

## 2019-03-01 VITALS — BP 157/98 | HR 94 | Ht 71.0 in | Wt 244.0 lb

## 2019-03-01 DIAGNOSIS — E1122 Type 2 diabetes mellitus with diabetic chronic kidney disease: Secondary | ICD-10-CM

## 2019-03-01 DIAGNOSIS — J329 Chronic sinusitis, unspecified: Secondary | ICD-10-CM

## 2019-03-01 DIAGNOSIS — E119 Type 2 diabetes mellitus without complications: Secondary | ICD-10-CM | POA: Diagnosis not present

## 2019-03-01 DIAGNOSIS — F419 Anxiety disorder, unspecified: Secondary | ICD-10-CM | POA: Diagnosis not present

## 2019-03-01 DIAGNOSIS — F32A Depression, unspecified: Secondary | ICD-10-CM | POA: Insufficient documentation

## 2019-03-01 DIAGNOSIS — E782 Mixed hyperlipidemia: Secondary | ICD-10-CM | POA: Diagnosis not present

## 2019-03-01 DIAGNOSIS — Z1231 Encounter for screening mammogram for malignant neoplasm of breast: Secondary | ICD-10-CM

## 2019-03-01 DIAGNOSIS — D1803 Hemangioma of intra-abdominal structures: Secondary | ICD-10-CM

## 2019-03-01 DIAGNOSIS — N281 Cyst of kidney, acquired: Secondary | ICD-10-CM

## 2019-03-01 DIAGNOSIS — N200 Calculus of kidney: Secondary | ICD-10-CM

## 2019-03-01 DIAGNOSIS — I700 Atherosclerosis of aorta: Secondary | ICD-10-CM

## 2019-03-01 DIAGNOSIS — R809 Proteinuria, unspecified: Secondary | ICD-10-CM

## 2019-03-01 DIAGNOSIS — I10 Essential (primary) hypertension: Secondary | ICD-10-CM | POA: Diagnosis not present

## 2019-03-01 DIAGNOSIS — E041 Nontoxic single thyroid nodule: Secondary | ICD-10-CM

## 2019-03-01 DIAGNOSIS — N183 Chronic kidney disease, stage 3 unspecified: Secondary | ICD-10-CM

## 2019-03-01 DIAGNOSIS — F329 Major depressive disorder, single episode, unspecified: Secondary | ICD-10-CM

## 2019-03-01 MED ORDER — HYDRALAZINE HCL 100 MG PO TABS: 100.0000 mg | ORAL_TABLET | Freq: Two times a day (BID) | ORAL | 3 refills | Status: DC

## 2019-03-01 NOTE — Patient Instructions (Addendum)
COVID-19 Vaccine Information can be found at: [email or web address] For questions related to vaccine distribution or appointments, please email [email or web address] or call [phone].   covid vaccine at [facility] as well  COVID 19  [phone] in [place]   [phone] in [place]  [phone] in [place]    Call [place] to schedule your mammogram asap [phone] the order is in  Take care   Diabetes Mellitus and Nutrition, Adult When you have diabetes (diabetes mellitus), it is very important to have healthy eating habits because your blood sugar (glucose) levels are greatly affected by what you eat and drink. Eating healthy foods in the appropriate amounts, at about the same times every day, can help you:  Control your blood glucose.  Lower your risk of heart disease.  Improve your blood pressure.  Reach or maintain a healthy weight. Every person with diabetes is different, and each person has different needs for a meal plan. Your health care provider may recommend that you work with a diet and nutrition specialist (dietitian) to make a meal plan that is best for you. Your meal plan may vary depending on factors such as:  The calories you need.  The medicines you take.  Your weight.  Your blood glucose, blood pressure, and cholesterol levels.  Your activity level.  Other health conditions you have, such as heart or kidney disease. How do carbohydrates affect me? Carbohydrates, also called carbs, affect your blood glucose level more than any other type of food. Eating carbs naturally raises the amount of glucose in your blood. Carb counting is a method for keeping track of how many carbs you eat. Counting carbs is important to keep your blood glucose at a healthy level, especially if you use insulin or take certain oral diabetes medicines. It is important to know how many carbs you can safely have in  each meal. This is different for every person. Your dietitian can help you calculate how many carbs you should have at each meal and for each snack. Foods that contain carbs include:  Bread, cereal, rice, pasta, and crackers.  Potatoes and corn.  Peas, beans, and lentils.  Milk and yogurt.  Fruit and juice.  Desserts, such as cakes, cookies, ice cream, and candy. How does alcohol affect me? Alcohol can cause a sudden decrease in blood glucose (hypoglycemia), especially if you use insulin or take certain oral diabetes medicines. Hypoglycemia can be a life-threatening condition. Symptoms of hypoglycemia (sleepiness, dizziness, and confusion) are similar to symptoms of having too much alcohol. If your health care provider says that alcohol is safe for you, follow these guidelines:  Limit alcohol intake to no more than 1 drink per day for nonpregnant women and 2 drinks per day for men. One drink equals 12 oz of beer, 5 oz of wine, or 1 oz of hard liquor.  Do not drink on an empty stomach.  Keep yourself hydrated with water, diet soda, or unsweetened iced tea.  Keep in mind that regular soda, juice, and other mixers may contain a lot of sugar and must be counted as carbs. What are tips for following this plan?  Reading food labels  Start by checking the serving size on the "Nutrition Facts" label of packaged foods and drinks. The amount of calories, carbs, fats, and other nutrients listed on the label is based on one serving of the item. Many items contain more than one serving per package.  Check the total grams (g) of carbs in one  serving. You can calculate the number of servings of carbs in one serving by dividing the total carbs by 15. For example, if a food has 30 g of total carbs, it would be equal to 2 servings of carbs.  Check the number of grams (g) of saturated and trans fats in one serving. Choose foods that have low or no amount of these fats.  Check the number of  milligrams (mg) of salt (sodium) in one serving. Most people should limit total sodium intake to less than 2,300 mg per day.  Always check the nutrition information of foods labeled as "low-fat" or "nonfat". These foods may be higher in added sugar or refined carbs and should be avoided.  Talk to your dietitian to identify your daily goals for nutrients listed on the label. Shopping  Avoid buying canned, premade, or processed foods. These foods tend to be high in fat, sodium, and added sugar.  Shop around the outside edge of the grocery store. This includes fresh fruits and vegetables, bulk grains, fresh meats, and fresh dairy. Cooking  Use low-heat cooking methods, such as baking, instead of high-heat cooking methods like deep frying.  Cook using healthy oils, such as olive, canola, or sunflower oil.  Avoid cooking with butter, cream, or high-fat meats. Meal planning  Eat meals and snacks regularly, preferably at the same times every day. Avoid going long periods of time without eating.  Eat foods high in fiber, such as fresh fruits, vegetables, beans, and whole grains. Talk to your dietitian about how many servings of carbs you can eat at each meal.  Eat 4-6 ounces (oz) of lean protein each day, such as lean meat, chicken, fish, eggs, or tofu. One oz of lean protein is equal to: ? 1 oz of meat, chicken, or fish. ? 1 egg. ?  cup of tofu.  Eat some foods each day that contain healthy fats, such as avocado, nuts, seeds, and fish. Lifestyle  Check your blood glucose regularly.  Exercise regularly as told by your health care provider. This may include: ? 150 minutes of moderate-intensity or vigorous-intensity exercise each week. This could be brisk walking, biking, or water aerobics. ? Stretching and doing strength exercises, such as yoga or weightlifting, at least 2 times a week.  Take medicines as told by your health care provider.  Do not use any products that contain nicotine  or tobacco, such as cigarettes and e-cigarettes. If you need help quitting, ask your health care provider.  Work with a Social worker or diabetes educator to identify strategies to manage stress and any emotional and social challenges. Questions to ask a health care provider  Do I need to meet with a diabetes educator?  Do I need to meet with a dietitian?  What number can I call if I have questions?  When are the best times to check my blood glucose? Where to find more information:  American Diabetes Association: diabetes.org  Academy of Nutrition and Dietetics: www.eatright.[employer] of Diabetes and Digestive and Kidney Diseases (NIH): [employer] Summary  A healthy meal plan will help you control your blood glucose and maintain a healthy lifestyle.  Working with a diet and nutrition specialist (dietitian) can help you make a meal plan that is best for you.  Keep in mind that carbohydrates (carbs) and alcohol have immediate effects on your blood glucose levels. It is important to count carbs and to use alcohol carefully. This information is not intended to replace advice given to  you by your health care provider. Make sure you discuss any questions you have with your health care provider. Document Revised: 12/02/2016 Document Reviewed: 01/25/2016 Elsevier Patient Education  [place].   Low-Sodium Eating Plan Sodium, which is an element that makes up salt, helps you maintain a healthy balance of fluids in your body. Too much sodium can increase your blood pressure and cause fluid and waste to be held in your body. Your health care provider or dietitian may recommend following this plan if you have high blood pressure (hypertension), kidney disease, liver disease, or heart failure. Eating less sodium can help lower your blood pressure, reduce swelling, and protect your heart, liver, and kidneys. What are tips for following this plan? General  guidelines  Most people on this plan should limit their sodium intake to 1,500-2,000 mg (milligrams) of sodium each day. Reading food labels   The Nutrition Facts label lists the amount of sodium in one serving of the food. If you eat more than one serving, you must multiply the listed amount of sodium by the number of servings.  Choose foods with less than 140 mg of sodium per serving.  Avoid foods with 300 mg of sodium or more per serving. Shopping  Look for lower-sodium products, often labeled as "low-sodium" or "no salt added."  Always check the sodium content even if foods are labeled as "unsalted" or "no salt added".  Buy fresh foods. ? Avoid canned foods and premade or frozen meals. ? Avoid canned, cured, or processed meats  Buy breads that have less than 80 mg of sodium per slice. Cooking  Eat more home-cooked food and less restaurant, buffet, and fast food.  Avoid adding salt when cooking. Use salt-free seasonings or herbs instead of table salt or sea salt. Check with your health care provider or pharmacist before using salt substitutes.  Cook with plant-based oils, such as canola, sunflower, or olive oil. Meal planning  When eating at a restaurant, ask that your food be prepared with less salt or no salt, if possible.  Avoid foods that contain MSG (monosodium glutamate). MSG is sometimes added to [place] food, bouillon, and some canned foods. What foods are recommended? The items listed may not be a complete list. Talk with your dietitian about what dietary choices are best for you. Grains Low-sodium cereals, including oats, puffed wheat and rice, and shredded wheat. Low-sodium crackers. Unsalted rice. Unsalted pasta. Low-sodium bread. Whole-grain breads and whole-grain pasta. Vegetables Fresh or frozen vegetables. "No salt added" canned vegetables. "No salt added" tomato sauce and paste. Low-sodium or reduced-sodium tomato and vegetable juice. Fruits Fresh, frozen,  or canned fruit. Fruit juice. Meats and other protein foods Fresh or frozen (no salt added) meat, poultry, seafood, and fish. Low-sodium canned tuna and salmon. Unsalted nuts. Dried peas, beans, and lentils without added salt. Unsalted canned beans. Eggs. Unsalted nut butters. Dairy Milk. Soy milk. Cheese that is naturally low in sodium, such as ricotta cheese, fresh mozzarella, or Swiss cheese Low-sodium or reduced-sodium cheese. Cream cheese. Yogurt. Fats and oils Unsalted butter. Unsalted margarine with no trans fat. Vegetable oils such as canola or olive oils. Seasonings and other foods Fresh and dried herbs and spices. Salt-free seasonings. Low-sodium mustard and ketchup. Sodium-free salad dressing. Sodium-free light mayonnaise. Fresh or refrigerated horseradish. Lemon juice. Vinegar. Homemade, reduced-sodium, or low-sodium soups. Unsalted popcorn and pretzels. Low-salt or salt-free chips. What foods are not recommended? The items listed may not be a complete list. Talk with your dietitian  about what dietary choices are best for you. Grains Instant hot cereals. Bread stuffing, pancake, and biscuit mixes. Croutons. Seasoned rice or pasta mixes. Noodle soup cups. Boxed or frozen macaroni and cheese. Regular salted crackers. Self-rising flour. Vegetables Sauerkraut, pickled vegetables, and relishes. Olives. [place] fries. Onion rings. Regular canned vegetables (not low-sodium or reduced-sodium). Regular canned tomato sauce and paste (not low-sodium or reduced-sodium). Regular tomato and vegetable juice (not low-sodium or reduced-sodium). Frozen vegetables in sauces. Meats and other protein foods Meat or fish that is salted, canned, smoked, spiced, or pickled. Bacon, ham, sausage, hotdogs, corned beef, chipped beef, packaged lunch meats, salt pork, jerky, pickled herring, anchovies, regular canned tuna, sardines, salted nuts. Dairy Processed cheese and cheese spreads. Cheese curds. Blue cheese.  Feta cheese. String cheese. Regular cottage cheese. Buttermilk. Canned milk. Fats and oils Salted butter. Regular margarine. Ghee. Bacon fat. Seasonings and other foods Onion salt, garlic salt, seasoned salt, table salt, and sea salt. Canned and packaged gravies. Worcestershire sauce. Tartar sauce. Barbecue sauce. Teriyaki sauce. Soy sauce, including reduced-sodium. Steak sauce. Fish sauce. Oyster sauce. Cocktail sauce. Horseradish that you find on the shelf. Regular ketchup and mustard. Meat flavorings and tenderizers. Bouillon cubes. Hot sauce and Tabasco sauce. Premade or packaged marinades. Premade or packaged taco seasonings. Relishes. Regular salad dressings. Salsa. Potato and tortilla chips. Corn chips and puffs. Salted popcorn and pretzels. Canned or dried soups. Pizza. Frozen entrees and pot pies. Summary  Eating less sodium can help lower your blood pressure, reduce swelling, and protect your heart, liver, and kidneys.  Most people on this plan should limit their sodium intake to 1,500-2,000 mg (milligrams) of sodium each day.  Canned, boxed, and frozen foods are high in sodium. Restaurant foods, fast foods, and pizza are also very high in sodium. You also get sodium by adding salt to food.  Try to cook at home, eat more fresh fruits and vegetables, and eat less fast food, canned, processed, or prepared foods. This information is not intended to replace advice given to you by your health care provider. Make sure you discuss any questions you have with your health care provider. Document Revised: 12/02/2016 Document Reviewed: 12/14/2015 Elsevier Patient Education  [place] DASH stands for "Dietary Approaches to Stop Hypertension." The DASH eating plan is a healthy eating plan that has been shown to reduce high blood pressure (hypertension). It may also reduce your risk for type 2 diabetes, heart disease, and stroke. The DASH eating plan may also help with  weight loss. What are tips for following this plan?  General guidelines  Avoid eating more than 2,300 mg (milligrams) of salt (sodium) a day. If you have hypertension, you may need to reduce your sodium intake to 1,500 mg a day.  Limit alcohol intake to no more than 1 drink a day for nonpregnant women and 2 drinks a day for men. One drink equals 12 oz of beer, 5 oz of wine, or 1 oz of hard liquor.  Work with your health care provider to maintain a healthy body weight or to lose weight. Ask what an ideal weight is for you.  Get at least 30 minutes of exercise that causes your heart to beat faster (aerobic exercise) most days of the week. Activities may include walking, swimming, or biking.  Work with your health care provider or diet and nutrition specialist (dietitian) to adjust your eating plan to your individual calorie needs. Reading food labels   Check food labels  for the amount of sodium per serving. Choose foods with less than 5 percent of the Daily Value of sodium. Generally, foods with less than 300 mg of sodium per serving fit into this eating plan.  To find whole grains, look for the word "whole" as the first word in the ingredient list. Shopping  Buy products labeled as "low-sodium" or "no salt added."  Buy fresh foods. Avoid canned foods and premade or frozen meals. Cooking  Avoid adding salt when cooking. Use salt-free seasonings or herbs instead of table salt or sea salt. Check with your health care provider or pharmacist before using salt substitutes.  Do not fry foods. Cook foods using healthy methods such as baking, boiling, grilling, and broiling instead.  Cook with heart-healthy oils, such as olive, canola, soybean, or sunflower oil. Meal planning  Eat a balanced diet that includes: ? 5 or more servings of fruits and vegetables each day. At each meal, try to fill half of your plate with fruits and vegetables. ? Up to 6-8 servings of whole grains each  day. ? Less than 6 oz of lean meat, poultry, or fish each day. A 3-oz serving of meat is about the same size as a deck of cards. One egg equals 1 oz. ? 2 servings of low-fat dairy each day. ? A serving of nuts, seeds, or beans 5 times each week. ? Heart-healthy fats. Healthy fats called Omega-3 fatty acids are found in foods such as flaxseeds and coldwater fish, like sardines, salmon, and mackerel.  Limit how much you eat of the following: ? Canned or prepackaged foods. ? Food that is high in trans fat, such as fried foods. ? Food that is high in saturated fat, such as fatty meat. ? Sweets, desserts, sugary drinks, and other foods with added sugar. ? Full-fat dairy products.  Do not salt foods before eating.  Try to eat at least 2 vegetarian meals each week.  Eat more home-cooked food and less restaurant, buffet, and fast food.  When eating at a restaurant, ask that your food be prepared with less salt or no salt, if possible. What foods are recommended? The items listed may not be a complete list. Talk with your dietitian about what dietary choices are best for you. Grains Whole-grain or whole-wheat bread. Whole-grain or whole-wheat pasta. Brown rice. [place]. Bulgur. Whole-grain and low-sodium cereals. Pita bread. Low-fat, low-sodium crackers. Whole-wheat flour tortillas. Vegetables Fresh or frozen vegetables (raw, steamed, roasted, or grilled). Low-sodium or reduced-sodium tomato and vegetable juice. Low-sodium or reduced-sodium tomato sauce and tomato paste. Low-sodium or reduced-sodium canned vegetables. Fruits All fresh, dried, or frozen fruit. Canned fruit in natural juice (without added sugar). Meat and other protein foods Skinless chicken or [place]. Ground chicken or [place]. Pork with fat trimmed off. Fish and seafood. Egg whites. Dried beans, peas, or lentils. Unsalted nuts, nut butters, and seeds. Unsalted canned beans. Lean cuts of beef with fat trimmed off.  Low-sodium, lean deli meat. Dairy Low-fat (1%) or fat-free (skim) milk. Fat-free, low-fat, or reduced-fat cheeses. Nonfat, low-sodium ricotta or cottage cheese. Low-fat or nonfat yogurt. Low-fat, low-sodium cheese. Fats and oils Soft margarine without trans fats. Vegetable oil. Low-fat, reduced-fat, or light mayonnaise and salad dressings (reduced-sodium). Canola, safflower, olive, soybean, and sunflower oils. Avocado. Seasoning and other foods Herbs. Spices. Seasoning mixes without salt. Unsalted popcorn and pretzels. Fat-free sweets. What foods are not recommended? The items listed may not be a complete list. Talk with your dietitian about what dietary choices  are best for you. Grains Baked goods made with fat, such as croissants, muffins, or some breads. Dry pasta or rice meal packs. Vegetables Creamed or fried vegetables. Vegetables in a cheese sauce. Regular canned vegetables (not low-sodium or reduced-sodium). Regular canned tomato sauce and paste (not low-sodium or reduced-sodium). Regular tomato and vegetable juice (not low-sodium or reduced-sodium). Angie Fava. Olives. Fruits Canned fruit in a light or heavy syrup. Fried fruit. Fruit in cream or butter sauce. Meat and other protein foods Fatty cuts of meat. Ribs. Fried meat. [place] Salines. Sausage. Bologna and other processed lunch meats. Salami. Fatback. Hotdogs. Bratwurst. Salted nuts and seeds. Canned beans with added salt. Canned or smoked fish. Whole eggs or egg yolks. Chicken or [place] with skin. Dairy Whole or 2% milk, cream, and half-and-half. Whole or full-fat cream cheese. Whole-fat or sweetened yogurt. Full-fat cheese. Nondairy creamers. Whipped toppings. Processed cheese and cheese spreads. Fats and oils Butter. Stick margarine. Lard. Shortening. Ghee. Bacon fat. Tropical oils, such as coconut, palm kernel, or palm oil. Seasoning and other foods Salted popcorn and pretzels. Onion salt, garlic salt, seasoned salt, table salt, and sea  salt. Worcestershire sauce. Tartar sauce. Barbecue sauce. Teriyaki sauce. Soy sauce, including reduced-sodium. Steak sauce. Canned and packaged gravies. Fish sauce. Oyster sauce. Cocktail sauce. Horseradish that you find on the shelf. Ketchup. Mustard. Meat flavorings and tenderizers. Bouillon cubes. Hot sauce and Tabasco sauce. Premade or packaged marinades. Premade or packaged taco seasonings. Relishes. Regular salad dressings. Where to find more information:  National Heart, Lung, and [place]: https://wilson-eaton.com/  American Heart Association: www.heart.org Summary  The DASH eating plan is a healthy eating plan that has been shown to reduce high blood pressure (hypertension). It may also reduce your risk for type 2 diabetes, heart disease, and stroke.  With the DASH eating plan, you should limit salt (sodium) intake to 2,300 mg a day. If you have hypertension, you may need to reduce your sodium intake to 1,500 mg a day.  When on the DASH eating plan, aim to eat more fresh fruits and vegetables, whole grains, lean proteins, low-fat dairy, and heart-healthy fats.  Work with your health care provider or diet and nutrition specialist (dietitian) to adjust your eating plan to your individual calorie needs. This information is not intended to replace advice given to you by your health care provider. Make sure you discuss any questions you have with your health care provider. Document Revised: 12/02/2016 Document Reviewed: 12/14/2015 Elsevier Patient Education  [place].   NO MAMMOGRAPHIC EVIDENCE OF BREAST CARCINOMA.  Recommendations: The patient's next mammogram exam should be in one year.  Final Assessment: BI-RADS 1: Negative.  Mammography does not detect all cancers. Any suspicious palpable mass should be evaluated further clinically.  Letter sent to the patient.  Result Narrative  Comparison is made to outside images from 12/17/2014 (bilateral - [facility] / Hospital)  and outside images from 04/12/2016 (bilateral - [place] [facility] / Hospital).      Exam: BILATERAL DIGITAL SCREENING MAMMOGRAM WITH CAD (COMPUTER-AIDED DETECTION)  History: Screening.  Technique: Bilateral screening mammographic imaging with CAD.  Breast Density: There are scattered areas of fibroglandular density.  Findings: There are no findings suggestive of malignancy.   Other Result Information  Interface, Rad Results In - 03/31/2017 10:11 AM EDT Comparison is made to outside images from 12/17/2014 (bilateral - [facility] / [facility]) and outside images from 04/12/2016 (bilateral - [facility] / Hospital).     Exam: BILATERAL DIGITAL SCREENING MAMMOGRAM WITH CAD (  COMPUTER-AIDED DETECTION)  History: Screening.  Technique: Bilateral screening mammographic imaging with CAD.  Breast Density: There are scattered areas of fibroglandular density.  Findings: There are no findings suggestive of malignancy.   IMPRESSION: NO MAMMOGRAPHIC EVIDENCE OF BREAST CARCINOMA.   Recommendations: The patient's next mammogram exam should be in one year.  Final Assessment: BI-RADS 1: Negative.  Mammography does not detect all cancers. Any suspicious palpable mass should be evaluated further clinically.  Letter sent to the patient.  Status Results Details    Zoster Vaccine, Recombinant injection What is this medicine? ZOSTER VACCINE (ZOS ter vak SEEN) is used to prevent shingles in adults 60 years old and over. This vaccine is not used to treat shingles or nerve pain from shingles. This medicine may be used for other purposes; ask your health care provider or pharmacist if you have questions. COMMON BRAND NAME(S): [facility] What should I tell my health care provider before I take this medicine? They need to know if you have any of these conditions:  blood disorders or disease  cancer like leukemia or lymphoma  immune system problems or therapy  an unusual or allergic reaction to vaccines,  other medications, foods, dyes, or preservatives  pregnant or trying to get pregnant  breast-feeding How should I use this medicine? This vaccine is for injection in a muscle. It is given by a health care professional. Talk to your pediatrician regarding the use of this medicine in children. This medicine is not approved for use in children. Overdosage: If you think you have taken too much of this medicine contact a poison control center or emergency room at once. NOTE: This medicine is only for you. Do not share this medicine with others. What if I miss a dose? Keep appointments for follow-up (booster) doses as directed. It is important not to miss your dose. Call your doctor or health care professional if you are unable to keep an appointment. What may interact with this medicine?  medicines that suppress your immune system  medicines to treat cancer  steroid medicines like prednisone or cortisone This list may not describe all possible interactions. Give your health care provider a list of all the medicines, herbs, non-prescription drugs, or dietary supplements you use. Also tell them if you smoke, drink alcohol, or use illegal drugs. Some items may interact with your medicine. What should I watch for while using this medicine? Visit your doctor for regular check ups. This vaccine, like all vaccines, may not fully protect everyone. What side effects may I notice from receiving this medicine? Side effects that you should report to your doctor or health care professional as soon as possible:  allergic reactions like skin rash, itching or hives, swelling of the face, lips, or tongue  breathing problems Side effects that usually do not require medical attention (report these to your doctor or health care professional if they continue or are bothersome):  chills  headache  fever  nausea, vomiting  redness, warmth, pain, swelling or itching at site where  injected  tiredness This list may not describe all possible side effects. Call your doctor for medical advice about side effects. You may report side effects to FDA at 1-800-FDA-1088. Where should I keep my medicine? This vaccine is only given in a clinic, pharmacy, doctor's office, or other health care setting and will not be stored at home. NOTE: This sheet is a summary. It may not cover all possible information. If you have questions about this medicine, talk to  your doctor, pharmacist, or health care provider.  2020 Elsevier/Gold Standard (2016-08-01 13:20:30)  Tdap (Tetanus, Diphtheria, Pertussis) Vaccine: What You Need to Know 1. Why get vaccinated? Tdap vaccine can prevent tetanus, diphtheria, and pertussis. Diphtheria and pertussis spread from person to person. Tetanus enters the body through cuts or wounds.  TETANUS (T) causes painful stiffening of the muscles. Tetanus can lead to serious health problems, including being unable to open the mouth, having trouble swallowing and breathing, or death.  DIPHTHERIA (D) can lead to difficulty breathing, heart failure, paralysis, or death.  PERTUSSIS (aP), also known as "whooping cough," can cause uncontrollable, violent coughing which makes it hard to breathe, eat, or drink. Pertussis can be extremely serious in babies and young children, causing pneumonia, convulsions, brain damage, or death. In teens and adults, it can cause weight loss, loss of bladder control, passing out, and rib fractures from severe coughing. 2. Tdap vaccine Tdap is only for children 7 years and older, adolescents, and adults.  Adolescents should receive a single dose of Tdap, preferably at age 81 or 22 years. Pregnant women should get a dose of Tdap during every pregnancy, to protect the newborn from pertussis. Infants are most at risk for severe, life-threatening complications from pertussis. Adults who have never received Tdap should get a dose of Tdap. Also,  adults should receive a booster dose every 10 years, or earlier in the case of a severe and dirty wound or burn. Booster doses can be either Tdap or Td (a different vaccine that protects against tetanus and diphtheria but not pertussis). Tdap may be given at the same time as other vaccines. 3. Talk with your health care provider Tell your vaccine provider if the person getting the vaccine:  Has had an allergic reaction after a previous dose of any vaccine that protects against tetanus, diphtheria, or pertussis, or has any severe, life-threatening allergies.  Has had a coma, decreased level of consciousness, or prolonged seizures within 7 days after a previous dose of any pertussis vaccine (DTP, DTaP, or Tdap).  Has seizures or another nervous system problem.  Has ever had Guillain-Barr Syndrome (also called GBS).  Has had severe pain or swelling after a previous dose of any vaccine that protects against tetanus or diphtheria. In some cases, your health care provider may decide to postpone Tdap vaccination to a future visit.  People with minor illnesses, such as a cold, may be vaccinated. People who are moderately or severely ill should usually wait until they recover before getting Tdap vaccine.  Your health care provider can give you more information. 4. Risks of a vaccine reaction  Pain, redness, or swelling where the shot was given, mild fever, headache, feeling tired, and nausea, vomiting, diarrhea, or stomachache sometimes happen after Tdap vaccine. People sometimes faint after medical procedures, including vaccination. Tell your provider if you feel dizzy or have vision changes or ringing in the ears.  As with any medicine, there is a very remote chance of a vaccine causing a severe allergic reaction, other serious injury, or death. 5. What if there is a serious problem? An allergic reaction could occur after the vaccinated person leaves the clinic. If you see signs of a severe allergic  reaction (hives, swelling of the face and throat, difficulty breathing, a fast heartbeat, dizziness, or weakness), call 9-1-1 and get the person to the nearest hospital. For other signs that concern you, call your health care provider.  Adverse reactions should be reported to the Vaccine Adverse  Event Reporting System (VAERS). Your health care provider will usually file this report, or you can do it yourself. Visit the VAERS website at www.vaers.SamedayNews.es or call (820) 783-1880. VAERS is only for reporting reactions, and VAERS staff do not give medical advice. 6. The National Vaccine Injury Compensation Program The [employer] Vaccine Injury Compensation Program (VICP) is a federal program that was created to compensate people who may have been injured by certain vaccines. Visit the VICP website at GoldCloset.com.ee or call 667-368-4681 to learn about the program and about filing a claim. There is a time limit to file a claim for compensation. 7. How can I learn more?  Ask your health care provider.  Call your local or state health department.  Contact the Centers for Disease Control and Prevention (CDC): ? Call 872-790-5496 (1-800-CDC-INFO) or ? Visit CDC's website at http://hunter.com/ Vaccine Information Statement Tdap (Tetanus, Diphtheria, Pertussis) Vaccine (04/04/2018) This information is not intended to replace advice given to you by your health care provider. Make sure you discuss any questions you have with your health care provider. Document Revised: 04/13/2018 Document Reviewed: 04/16/2018 Elsevier Patient Education  [place].

## 2019-03-04 ENCOUNTER — Encounter: Payer: Self-pay | Admitting: Internal Medicine

## 2019-03-04 DIAGNOSIS — N200 Calculus of kidney: Secondary | ICD-10-CM | POA: Insufficient documentation

## 2019-03-04 DIAGNOSIS — I700 Atherosclerosis of aorta: Secondary | ICD-10-CM | POA: Insufficient documentation

## 2019-03-04 DIAGNOSIS — N281 Cyst of kidney, acquired: Secondary | ICD-10-CM | POA: Insufficient documentation

## 2019-03-04 DIAGNOSIS — E041 Nontoxic single thyroid nodule: Secondary | ICD-10-CM | POA: Insufficient documentation

## 2019-03-04 DIAGNOSIS — J329 Chronic sinusitis, unspecified: Secondary | ICD-10-CM | POA: Insufficient documentation

## 2019-03-04 DIAGNOSIS — D1803 Hemangioma of intra-abdominal structures: Secondary | ICD-10-CM | POA: Insufficient documentation

## 2019-03-04 DIAGNOSIS — R809 Proteinuria, unspecified: Secondary | ICD-10-CM | POA: Insufficient documentation

## 2019-03-04 MED ORDER — INSULIN LISPRO (1 UNIT DIAL) 100 UNIT/ML (KWIKPEN): PEN_INJECTOR | SUBCUTANEOUS | 11 refills | Status: DC

## 2019-03-04 MED ORDER — GLUCOSE BLOOD VI STRP: ORAL_STRIP | 12 refills | Status: DC

## 2019-03-04 MED ORDER — RELION LANCETS ULTRA-THIN 30G MISC: 1.0000 | Freq: Three times a day (TID) | 12 refills | Status: DC

## 2019-03-04 MED ORDER — PEN NEEDLES 31G X 8 MM MISC: 1.0000 | Freq: Three times a day (TID) | 12 refills | Status: DC

## 2019-03-04 NOTE — Progress Notes (Signed)
Virtual Visit via Video Note  I connected with Nancy Barajas   on 03/01/19 at 11:55 AM EST by a video enabled telemedicine application and verified that I am speaking with the correct person using two identifiers.  Location patient: home Location provider:work or home office Persons participating in the virtual visit: patient, provider  I discussed the limitations of evaluation and management by telemedicine and the availability of in person appointments. The patient expressed understanding and agreed to proceed.   HPI: 1. DM 2 uncontrolled with CKD 3 w/proteinuria, Cre 1.12 with GFR 49.74 with proteinuria  E5U 8.6 on trulicity 3 mg 1x per week x 2 months. She has not heard from nutrition yet will check on referral  A1C 8.6 02/20/19 uncontrolled she reports diet is noncompliant   2. HTN uncontrolled BP 140s-170s/90s she is off clonidine per outside provider renal. On norvasc 10 mg qd, coreg 25 mg bid, hydralazine 50 mg bid and losartan 50 mg bid.    3. Anxiety/depression was on zoloft x 30 years and wants to f/u with [facility] but has not really seen since the pandemic.  Also disc psychiatry for her in the future   ROS: See pertinent positives and negatives per HPI.  Past Medical History:  Diagnosis Date  . Asthma   . Diabetes mellitus without complication ([place])   . Hypertension     Past Surgical History:  Procedure Laterality Date  . LITHOTRIPSY      Family History  Problem Relation Age of Onset  . Hypertension Mother   . Diabetes Mother   . Hypertension Father   . Diabetes Father     SOCIAL HX:  Lives at home    Current Outpatient Medications:  .  amLODipine (NORVASC) 10 MG tablet, Take 1 tablet (10 mg total) by mouth daily., Disp: 90 tablet, Rfl: 3 .  blood glucose meter kit and supplies, Dispense based on patient and insurance preference. Use up to four times daily as directed. (FOR ICD-10 E10.9, E11.9)., Disp: 1 each, Rfl: 0 .  carvedilol (COREG) 25 MG tablet, Take  1 tablet (25 mg total) by mouth 2 (two) times daily with a meal., Disp: 180 tablet, Rfl: 1 .  Cholecalciferol (VITAMIN D-3) 125 MCG (5000 UT) TABS, Take 1 tablet by mouth daily., Disp: 90 tablet, Rfl: 3 .  Dulaglutide (TRULICITY) 3 DJ/4.9FW SOPN, Inject 3 mg into the skin once a week., Disp: 5 pen, Rfl: 5 .  hydrALAZINE (APRESOLINE) 100 MG tablet, Take 1 tablet (100 mg total) by mouth 2 (two) times daily., Disp: 180 tablet, Rfl: 3 .  losartan (COZAAR) 50 MG tablet, Take 1 tablet (50 mg) by mouth twice daily, Disp: 180 tablet, Rfl: 3 .  Multiple Vitamins-Minerals (CENTRUM SILVER 50+WOMEN PO), Take 1 tablet by mouth daily., Disp: , Rfl:  .  rosuvastatin (CRESTOR) 10 MG tablet, Take 10 mg by mouth daily., Disp: , Rfl:   EXAM:  VITALS per patient if applicable:  GENERAL: alert, oriented, appears well and in no acute distress  HEENT: atraumatic, conjunttiva clear, no obvious abnormalities on inspection of external nose and ears  NECK: normal movements of the head and neck  LUNGS: on inspection no signs of respiratory distress, breathing rate appears normal, no obvious gross SOB, gasping or wheezing  CV: no obvious cyanosis  MS: moves all visible extremities without noticeable abnormality  PSYCH/NEURO: pleasant and cooperative, no obvious depression or anxiety, speech and thought processing grossly intact  ASSESSMENT AND PLAN:  Discussed the following assessment  and plan:  Uncontrolled hypertension/HLD - Plan: hydrALAZINE (APRESOLINE) 100 MG tablet Cont meds only change is increase hydralazine 100 mg bid from 50 bid  Could consider coreg cr in the future now taking 25 mg bid  Off clonidine per out side provider per pt  rec healthy diet and exercise   Type 2 diabetes mellitus uncontrolled with hyperglycemia, CKD 3 and proteinuria  -check cbgs 3x per day  trulicity continue 3 mg 1x per week  Add humalog flexpen SSI-M scale 3x per day 15-20 minutes before meals  Check sugar fasting  before breakfast, lunch, dinner and qhs F/u nutrition HLD, HTN, CKD 3, and diabetes  If this does not help add lantus 10 units daily   CKD stage 3 secondary to diabetes/HTN with proteinuria  F/u Dr. Abigail Butts  Consider Renal US duplex if BP not controlled with increased hydralazine   Anxiety and depression - Plan: Ambulatory referral to Psychology  -consider psychiatry in the future as well  Was on zoloft x 30 years in the past   HM Flu utd Tdap  shingrix  covid considering   mammo ordered call to schedule  Pap 02/05/2018 negative  Colonoscopy never had due to anxiety   -we discussed possible serious and likely etiologies, options for evaluation and workup, limitations of telemedicine visit vs in person visit, treatment, treatment risks and precautions. Pt prefers to treat via telemedicine empirically rather then risking or undertaking an in person visit at this moment. Patient agrees to seek prompt in person care if worsening, new symptoms arise, or if is not improving with treatment.   I discussed the assessment and treatment plan with the patient. The patient was provided an opportunity to ask questions and all were answered. The patient agreed with the plan and demonstrated an understanding of the instructions.   The patient was advised to call back or seek an in-person evaluation if the symptoms worsen or if the condition fails to improve as anticipated.  Time spent 30 minutes plan of care and chart review  Delorise Jackson, MD

## 2019-03-05 ENCOUNTER — Telehealth: Payer: Self-pay | Admitting: Internal Medicine

## 2019-03-05 ENCOUNTER — Encounter: Payer: Self-pay | Admitting: Internal Medicine

## 2019-03-05 MED ORDER — INSULIN LISPRO (1 UNIT DIAL) 100 UNIT/ML (KWIKPEN): PEN_INJECTOR | SUBCUTANEOUS | 11 refills | Status: DC

## 2019-03-05 NOTE — Progress Notes (Signed)
Mailed AVS from last visit to address on file along with mammogram scheduling information.

## 2019-03-05 NOTE — Addendum Note (Signed)
Addended by: Thressa Sheller on: 03/05/2019 08:43 AM   Modules accepted: Orders

## 2019-03-05 NOTE — Telephone Encounter (Signed)
-----   Message from Delorise Jackson, MD sent at 03/04/2019  3:52 PM EST ----- Please fax Humalog Rx on printer now to her pharmacy [facility] or call in [facility]

## 2019-03-05 NOTE — Telephone Encounter (Signed)
Humalog RX sent electronically to pharmacy. E-script receipt confirmed.   Spoke with patient and informed of insulin RX being sent along with instructions on how to take this and when to check her sugars. Patient verbalized understanding  Patient is agreeable to referral to [facility] and will await their phone call to schedule.

## 2019-03-05 NOTE — Telephone Encounter (Signed)
-----   Message from Delorise Jackson, MD sent at 03/04/2019  3:44 PM EST ----- Can you call pt and ask if wants referral psychiatry ?if so let me know Referred to [facility] he should call if does not hear let me know Insulin novolog flexpen at pharmacy instructions will be on there she is to take 3x per day 15-20 minutes before meals based on the scale, check am fasting sugar as well so 4x per day sugarHas strips lancets at pharmacy AVS on printer to mail to pt nowThanksTMs

## 2019-03-11 ENCOUNTER — Other Ambulatory Visit (INDEPENDENT_AMBULATORY_CARE_PROVIDER_SITE_OTHER): Payer: Self-pay | Admitting: Nephrology

## 2019-03-11 DIAGNOSIS — N1831 Chronic kidney disease, stage 3a: Secondary | ICD-10-CM

## 2019-03-11 DIAGNOSIS — I129 Hypertensive chronic kidney disease with stage 1 through stage 4 chronic kidney disease, or unspecified chronic kidney disease: Secondary | ICD-10-CM

## 2019-03-12 ENCOUNTER — Other Ambulatory Visit: Payer: Self-pay

## 2019-03-12 ENCOUNTER — Ambulatory Visit (INDEPENDENT_AMBULATORY_CARE_PROVIDER_SITE_OTHER): Payer: Medicare Other

## 2019-03-12 DIAGNOSIS — I129 Hypertensive chronic kidney disease with stage 1 through stage 4 chronic kidney disease, or unspecified chronic kidney disease: Secondary | ICD-10-CM | POA: Diagnosis not present

## 2019-03-12 DIAGNOSIS — N1831 Chronic kidney disease, stage 3a: Secondary | ICD-10-CM

## 2019-03-13 ENCOUNTER — Ambulatory Visit: Payer: Medicare Other | Admitting: Skilled Nursing Facility1

## 2019-03-18 ENCOUNTER — Ambulatory Visit (INDEPENDENT_AMBULATORY_CARE_PROVIDER_SITE_OTHER): Payer: Medicare Other | Admitting: Pharmacist

## 2019-03-18 DIAGNOSIS — E119 Type 2 diabetes mellitus without complications: Secondary | ICD-10-CM | POA: Diagnosis not present

## 2019-03-18 DIAGNOSIS — I10 Essential (primary) hypertension: Secondary | ICD-10-CM | POA: Diagnosis not present

## 2019-03-18 NOTE — Patient Instructions (Signed)
Visit Information  Goals Addressed            This Visit's Progress     Patient Stated   . "I want to control my diabetes" (pt-stated)       CARE PLAN ENTRY (see longtitudinal plan of care for additional care plan information)  Current Barriers:  . Diabetes: uncontrolled; most recent A1c 8.6% o Denies cost concerns w/ Humalog o Notes that s . Current antihyperglycemic regimen: Trulicity 3 mg weekly; Humalog sliding scale:  70-130 0 units, 131-180 4 units, 181-240 8 units, 241-300 10 units, 301-350 12 units, 351-400 16 units; notes she is most frequenting using 4-8 units o Diagnosed in 2016  o Previously on metformin, stopped d/t kidney function decline o Previously on Jardiance, stopped d/t recurrent yeast infections on low dose  . Denies hypoglycemia since starting Humalog . Current glucose readings:   Fasting After B After Lunch Before Supper After Supper  1-Mar 131   148   2-Mar 121  194    3-Mar 139   156   4-Mar 105      5-Mar 139    87  6-Mar 128  151 150   7-Mar 129 143     8-Mar 135 137  138 101  9-Mar 126    187  10-Mar 117 160 142  187  11-Mar 113 135  180 150  12-Mar 138 203  135 158  13-Mar 133 167   175  14-Mar 115 155  152   15-Mar 112      AVG 125 157 162 151 149   . Cardiovascular risk reduction: o Current hypertensive regimen: carvedilol 25 mg BID, amlodipine 10 mg QAM, hydralazine 100 mg BID, losartan 50 mg BID; current BP readings  AM SBP AM DBP HR PM SBP PM DBP PM HR  1-Mar 123 89 95 116 88 98  2-Mar 140 87 89 124 88 87  3-Mar 145 98 89 151 98 89  4-Mar 144 106 83 145 95 93  5-Mar 145 97 95     6-Mar 129 97 88 124 84 89  7-Mar 129 99 82     8-Mar 134 98 94 146 102 87  9-Mar 127 86 90     10-Mar 125 86 96     11-Mar 143 101 94     12-Mar 143 100 95     13-Mar 139 95 92     14-Mar 141 101 88     15-Mar 144 102 84     AVG 137 96 90 134 93 90   o Current hyperlipidemia regimen: rosuvastatin 10 mg; due for updated LDL with next lab work   o   Public house manager):  Marland Kitchen Over the next 90 days, patient with work with PharmD and primary care provider to address optimized glycemic management  Interventions: . Comprehensive medication review performed, medication list updated in electronic medical record . Praised for appropriate use of Humalog mealtime insulin. Difficult to tell if doses need to be adjusted at this point d/t sporadic readings through the day. Given 3+ injections daily and frequent BG checks, patient may qualify for FreeStyle Libre coverage through insurance. Will discuss w/ PCP. Would need to collaborate w/ DME supplier to determine what out of pocket cost would be for the patient. . Reviewed upcoming appts, including cardiology, nutrition, PCP, and therapy. Patient verbalized understanding and denied any cost concerns at this time. Is looking forward to discussing healthy and affordable options w/  dietician.  Patient Self Care Activities:  . Patient will check blood glucose BID, document, and provide at future appointments . Patient will take medications as prescribed . Patient will report any questions or concerns to provider   Please see past updates related to this goal by clicking on the "Past Updates" button in the selected goal         Patient verbalizes understanding of instructions provided today.   Plan:  - Scheduled f/u call 04/25/19  [name], PharmD, BCACP, CPP Clinical Pharmacist [place] [phone]

## 2019-03-18 NOTE — Chronic Care Management (AMB) (Signed)
Chronic Care Management   Follow Up Note   03/18/2019 Name: Nancy Barajas MRN: [id number] DOB: November 28, 1960  Referred by: McLean-Scocuzza, Nino Glow, MD Reason for referral : Chronic Care Management (Medication Management)   Nancy Barajas is a 59 y.o. year old female who is a primary care patient of McLean-Scocuzza, Nino Glow, MD. The CCM team was consulted for assistance with chronic disease management and care coordination needs.   Contacted patient for medication management review.   Review of patient status, including review of consultants reports, relevant laboratory and other test results, and collaboration with appropriate care team members and the patient's provider was performed as part of comprehensive patient evaluation and provision of chronic care management services.    SDOH (Social Determinants of Health) assessments performed: Yes See Care Plan activities for detailed interventions related to SDOH)  SDOH Interventions     Most Recent Value  SDOH Interventions  SDOH Interventions for the Following Domains  Financial Strain  Financial Strain Interventions  Other (Comment) [medicare extra help application]       Outpatient Encounter Medications as of 03/18/2019  Medication Sig  . amLODipine (NORVASC) 10 MG tablet Take 1 tablet (10 mg total) by mouth daily.  . blood glucose meter kit and supplies Dispense based on patient and insurance preference. Use up to four times daily as directed. (FOR ICD-10 E10.9, E11.9).  . carvedilol (COREG) 25 MG tablet Take 1 tablet (25 mg total) by mouth 2 (two) times daily with a meal.  . Cholecalciferol (VITAMIN D-3) 125 MCG (5000 UT) TABS Take 1 tablet by mouth daily.  . Dulaglutide (TRULICITY) 3 MP/5.3IR SOPN Inject 3 mg into the skin once a week.  Marland Kitchen glucose blood test strip relion strips Use as instructed check sugar fasting in am and 15-20 minutes before lunch and dinner then at night  . hydrALAZINE (APRESOLINE) 100 MG tablet Take 1  tablet (100 mg total) by mouth 2 (two) times daily.  . insulin lispro (HUMALOG KWIKPEN) 100 UNIT/ML KwikPen Check sugar 15-20 minutes before meals and give insulin based on sugar reading 70-130 0 units, 131-180 4 units, 181-240 8 units, 241-300 10 units, 301-350 12 units, 351-400 16 units, >400 20 units and call the doctor  . Insulin Pen Needle (PEN NEEDLES) 31G X 8 MM MISC 1 Device by Does not apply route in the morning, at noon, and at bedtime. Use for humalog  . losartan (COZAAR) 50 MG tablet Take 1 tablet (50 mg) by mouth twice daily  . Multiple Vitamins-Minerals (CENTRUM SILVER 50+WOMEN PO) Take 1 tablet by mouth daily.  . ReliOn Ultra Thin Lancets MISC 1 Device by Does not apply route 4 (four) times daily -  before meals and at bedtime. relion brand  . rosuvastatin (CRESTOR) 10 MG tablet Take 10 mg by mouth daily.   No facility-administered encounter medications on file as of 03/18/2019.     Objective:   Goals Addressed            This Visit's Progress     Patient Stated   . "I want to control my diabetes" (pt-stated)       CARE PLAN ENTRY (see longtitudinal plan of care for additional care plan information)  Current Barriers:  . Diabetes: uncontrolled; most recent A1c 8.6% o Denies cost concerns w/ Humalog o Notes that s . Current antihyperglycemic regimen: Trulicity 3 mg weekly; Humalog sliding scale:  70-130 0 units, 131-180 4 units, 181-240 8 units, 241-300 10 units, 301-350 12 units, 351-400  16 units; notes she is most frequenting using 4-8 units o Diagnosed in 2016  o Previously on metformin, stopped d/t kidney function decline o Previously on Jardiance, stopped d/t recurrent yeast infections on low dose  . Denies hypoglycemia since starting Humalog . Current glucose readings:   Fasting After B After Lunch Before Supper After Supper  1-Mar 131   148   2-Mar 121  194    3-Mar 139   156   4-Mar 105      5-Mar 139    87  6-Mar 128  151 150   7-Mar 129 143     8-Mar  135 137  138 101  9-Mar 126    187  10-Mar 117 160 142  187  11-Mar 113 135  180 150  12-Mar 138 203  135 158  13-Mar 133 167   175  14-Mar 115 155  152   15-Mar 112      AVG 125 157 162 151 149   . Cardiovascular risk reduction: o Current hypertensive regimen: carvedilol 25 mg BID, amlodipine 10 mg QAM, hydralazine 100 mg BID, losartan 50 mg BID; current BP readings  AM SBP AM DBP HR PM SBP PM DBP PM HR  1-Mar 123 89 95 116 88 98  2-Mar 140 87 89 124 88 87  3-Mar 145 98 89 151 98 89  4-Mar 144 106 83 145 95 93  5-Mar 145 97 95     6-Mar 129 97 88 124 84 89  7-Mar 129 99 82     8-Mar 134 98 94 146 102 87  9-Mar 127 86 90     10-Mar 125 86 96     11-Mar 143 101 94     12-Mar 143 100 95     13-Mar 139 95 92     14-Mar 141 101 88     15-Mar 144 102 84     AVG 137 96 90 134 93 90   o Current hyperlipidemia regimen: rosuvastatin 10 mg; LDL at goal <70 o   Pharmacist Clinical Goal(s):  Marland Kitchen Over the next 90 days, patient with work with PharmD and primary care provider to address optimized glycemic management  Interventions: . Comprehensive medication review performed, medication list updated in electronic medical record . Praised for appropriate use of Humalog mealtime insulin. Difficult to tell if doses need to be adjusted at this point d/t sporadic readings through the day. Given 3+ injections daily and frequent BG checks, patient may qualify for FreeStyle Libre coverage through insurance. Will discuss w/ PCP. Would need to collaborate w/ DME supplier to determine what out of pocket cost would be for the patient. . Reviewed upcoming appts, including cardiology, nutrition, PCP, and therapy. Patient verbalized understanding and denied any cost concerns at this time. Is looking forward to discussing healthy and affordable options w/ dietician.  Patient Self Care Activities:  . Patient will check blood glucose BID, document, and provide at future appointments . Patient will take  medications as prescribed . Patient will report any questions or concerns to provider   Please see past updates related to this goal by clicking on the "Past Updates" button in the selected goal          Plan:  - Scheduled f/u call 04/25/19  [name], PharmD, BCACP, [place] Pharmacist [place] [phone]

## 2019-03-20 ENCOUNTER — Other Ambulatory Visit: Payer: Self-pay

## 2019-03-20 ENCOUNTER — Ambulatory Visit (INDEPENDENT_AMBULATORY_CARE_PROVIDER_SITE_OTHER): Payer: Medicare Other

## 2019-03-20 DIAGNOSIS — I517 Cardiomegaly: Secondary | ICD-10-CM

## 2019-03-20 NOTE — Progress Notes (Signed)
Discuss freestyle libre at next f/u    FYI - May be able to qualify for FreeStyle [place] moving forward. Would likely be best to address after your April appt, because DME supplier would need a visit note stating how often she is injecting and how many times per day she is checking BG.   Nancy  Barajas

## 2019-03-20 NOTE — Progress Notes (Signed)
Noted  TMS 

## 2019-03-26 ENCOUNTER — Ambulatory Visit (INDEPENDENT_AMBULATORY_CARE_PROVIDER_SITE_OTHER): Payer: Medicare Other | Admitting: Psychology

## 2019-03-26 DIAGNOSIS — F418 Other specified anxiety disorders: Secondary | ICD-10-CM | POA: Diagnosis not present

## 2019-03-27 ENCOUNTER — Other Ambulatory Visit: Payer: Self-pay

## 2019-03-27 ENCOUNTER — Encounter: Payer: Medicare Other | Attending: Internal Medicine | Admitting: Dietician

## 2019-03-27 VITALS — Ht 71.0 in | Wt 243.0 lb

## 2019-03-27 DIAGNOSIS — N183 Chronic kidney disease, stage 3 unspecified: Secondary | ICD-10-CM | POA: Diagnosis not present

## 2019-03-27 DIAGNOSIS — E1122 Type 2 diabetes mellitus with diabetic chronic kidney disease: Secondary | ICD-10-CM

## 2019-03-27 DIAGNOSIS — I129 Hypertensive chronic kidney disease with stage 1 through stage 4 chronic kidney disease, or unspecified chronic kidney disease: Secondary | ICD-10-CM | POA: Insufficient documentation

## 2019-03-27 DIAGNOSIS — Z713 Dietary counseling and surveillance: Secondary | ICD-10-CM | POA: Insufficient documentation

## 2019-03-27 DIAGNOSIS — N1831 Chronic kidney disease, stage 3a: Secondary | ICD-10-CM

## 2019-03-27 DIAGNOSIS — I10 Essential (primary) hypertension: Secondary | ICD-10-CM

## 2019-03-27 DIAGNOSIS — E782 Mixed hyperlipidemia: Secondary | ICD-10-CM | POA: Diagnosis not present

## 2019-03-27 NOTE — Patient Instructions (Signed)
   Use menus for balanced meal options and examples for other meal ideas. It's ok to substitute foods from the same food group.  Continue to control portions of rice and other starchy foods. Eat generous portions of low-carb veggies.   Cottage cheese and fruit can be a snack or a meal if not hungry.   OK to try a protein drink to sub for your meal if not hungry, and can add some carb food(s) if you are hungry.

## 2019-03-27 NOTE — Progress Notes (Signed)
Medical Nutrition Therapy: Visit start time: 0900  end time: 1000  Assessment:  Diagnosis: Type 2 DM, HTN, CKD stage 3, HLD Past medical history: vocal chord and breathing disfunction Psychosocial issues/ stress concerns: anxiety and depression  Preferred learning method:  . Auditory . Visual . Hands-on   Current weight: 243.0lbs Height: 5'11" Medications, supplements: reconciled list in medical record  Progress and evaluation:   Patient reports diagnosis of diabetes 2016; recently restarted Humalog.   She reports BP higher than goal; lipids well controlled with rosuvastatin.   GFR 49.76; sodium 138, potassium 4.1, alk phos 74; also reports history of kidney stones  She states she often will eat whatever is on hand and easy to prepare  Physical activity: none currently due to anxiety, breathing issues  Dietary Intake:  Usual eating pattern includes 3 meals and 1-2 snacks per day. Dining out frequency: 3 meals per week.  Breakfast: takes meds at 9am -- chicken, eggs and bacon, usu whatever can find in fridge Snack: none Lunch: quick meal from fridge; occ microwaves fish Snack: chips; popcorn is favorite Supper: same as lunch; takeout often fried or grilled chicken, sometimes with salad, +/or mashed potatoes Snack: none or same as pm Beverages: water; recently stopped sodas  Nutrition Care Education: Topics covered:  Basic nutrition: basic food groups, appropriate nutrient balance, appropriate meal and snack schedule, general nutrition guidelines    Weight control: importance of low sugar and low fat choices, appropriate food portions using plate method, hand comparisons, and food models Advanced nutrition:  food label reading for carbohydrates, sodium Diabetes:  goals for BGs, importance of BG and BP control in preserving renal function, appropriate meal and snack schedule, appropriate carb intake and balance, healthy carb choices, role of fiber, protein, fat Hypertension:  identifying high sodium foods, goal for sodium intake with meals. identifying food sources of potassium, magnesium Hyperlipidemia: healthy and unhealthy fats, role of fiber, plant foods   Nutritional Diagnosis:  [place]-2.2 Altered nutrition-related laboratory As related to diabetes, CKD.  As evidenced by recent HbA1C of 8.6%, and GFR 49. [place]-3.3 Overweight/obesity As related to excess calories, inactivity.  As evidenced by patient with current BMI of 33.89.  Intervention:   Instruction and discussion as noted above.  Patient has begun making dietary changes to control BGs and improve health issues.  Established nutrition goals with direction from patient.  No follow-up scheduled at this time; patient will schedule later if needed.  Education Materials given:  . General diet guidelines for Diabetes . Plate Planner with food lists, sample meal pattern . sample menus . Goals/ instructions   Learner/ who was taught:  . Patient   Level of understanding: Marland Kitchen Verbalizes/ demonstrates competency   Demonstrated degree of understanding via:   Teach back Learning barriers: . None  Willingness to learn/ readiness for change: . Eager, change in progress  Monitoring and Evaluation:  Dietary intake, exercise, BG control, renal function, HTN, lipids, and body weight      follow up: prn

## 2019-04-01 ENCOUNTER — Telehealth: Payer: Self-pay | Admitting: Internal Medicine

## 2019-04-01 ENCOUNTER — Other Ambulatory Visit: Payer: Self-pay | Admitting: Internal Medicine

## 2019-04-01 DIAGNOSIS — E119 Type 2 diabetes mellitus without complications: Secondary | ICD-10-CM

## 2019-04-01 MED ORDER — TRULICITY 3 MG/0.5ML ~~LOC~~ SOAJ: 3.0000 mg | SUBCUTANEOUS | 5 refills | Status: DC

## 2019-04-01 NOTE — Telephone Encounter (Signed)
Left message informing patient that medications are at [facility] to be picked up. Did not leave details of which medications.

## 2019-04-01 NOTE — Telephone Encounter (Signed)
Pt needs refill on losartan (COZAAR) 50 MG tablet twice a day instead of once. And needs refill on Dulaglutide (TRULICITY) 3 0000000 SOPN

## 2019-04-01 NOTE — Telephone Encounter (Signed)
Refilled trulicity  She has refills on losartan until 12/2019 per cardiology  Call [facility] to refill

## 2019-04-02 ENCOUNTER — Encounter: Payer: Self-pay | Admitting: Internal Medicine

## 2019-04-02 ENCOUNTER — Ambulatory Visit (INDEPENDENT_AMBULATORY_CARE_PROVIDER_SITE_OTHER): Payer: Medicare Other | Admitting: Cardiology

## 2019-04-02 ENCOUNTER — Encounter: Payer: Self-pay | Admitting: Cardiology

## 2019-04-02 ENCOUNTER — Other Ambulatory Visit: Payer: Self-pay

## 2019-04-02 VITALS — BP 110/80 | HR 98 | Ht 70.0 in | Wt 244.4 lb

## 2019-04-02 DIAGNOSIS — I10 Essential (primary) hypertension: Secondary | ICD-10-CM

## 2019-04-02 DIAGNOSIS — I5020 Unspecified systolic (congestive) heart failure: Secondary | ICD-10-CM | POA: Diagnosis not present

## 2019-04-02 MED ORDER — ENTRESTO 49-51 MG PO TABS: 1.0000 | ORAL_TABLET | Freq: Two times a day (BID) | ORAL | 5 refills | Status: DC

## 2019-04-02 NOTE — H&P (View-Only) (Signed)
Cardiology Office Note:    Date:  04/02/2019   ID:  Nancy Barajas, DOB 07/18/1960, MRN [id number]  PCP:  McLean-Scocuzza, Tracy N, MD  Cardiologist:  Emogene Muratalla Agbor-Etang, MD  Electrophysiologist:  None   Referring MD: Guse, Lauren M, FNP   Chief Complaint  Patient presents with  . other    2 month follow up and discuss Echo results. "doing well."     History of Present Illness:    Nancy Barajas is a 58 y.o. female with a hx of hypertension, diabetes, hyperlipidemia, CKD stage III, who presents for follow-up.    She was originally seen due to uncontrolled hypertension.  Patient was diagnosed with hypertension about 4 years ago.  She has tried a couple of medications with some improvement but her blood pressure is still elevated.  She endorsed not eating healthy low-salt diet.  When diagnosed her blood pressures were usually in the 180s to 200s systolic.  At last visit, they range anywhere from 140s to 160s.  Losartan was titrated after last visit.  She was advised to taper of clonidine.  EKG showed LVH, echocardiogram was ordered.  She states feeling tired walking on level ground for long.  Denies edema, orthopnea.   Past Medical History:  Diagnosis Date  . Asthma   . Diabetes mellitus without complication (HCC)   . Hypertension   . Kidney stones     Past Surgical History:  Procedure Laterality Date  . LITHOTRIPSY      Current Medications: Current Meds  Medication Sig  . amLODipine (NORVASC) 10 MG tablet Take 1 tablet (10 mg total) by mouth daily.  . blood glucose meter kit and supplies Dispense based on patient and insurance preference. Use up to four times daily as directed. (FOR ICD-10 E10.9, E11.9).  . carvedilol (COREG) 25 MG tablet Take 1 tablet (25 mg total) by mouth 2 (two) times daily with a meal.  . Cholecalciferol (VITAMIN D-3) 125 MCG (5000 UT) TABS Take 1 tablet by mouth daily.  . Dulaglutide (TRULICITY) 3 MG/0.5ML SOPN Inject 3 mg into the skin once a  week.  . glucose blood test strip relion strips Use as instructed check sugar fasting in am and 15-20 minutes before lunch and dinner then at night  . hydrALAZINE (APRESOLINE) 100 MG tablet Take 1 tablet (100 mg total) by mouth 2 (two) times daily.  . insulin lispro (HUMALOG KWIKPEN) 100 UNIT/ML KwikPen Check sugar 15-20 minutes before meals and give insulin based on sugar reading 70-130 0 units, 131-180 4 units, 181-240 8 units, 241-300 10 units, 301-350 12 units, 351-400 16 units, >400 20 units and call the doctor  . Insulin Pen Needle (PEN NEEDLES) 31G X 8 MM MISC 1 Device by Does not apply route in the morning, at noon, and at bedtime. Use for humalog  . Multiple Vitamins-Minerals (CENTRUM SILVER 50+WOMEN PO) Take 1 tablet by mouth daily.  . ReliOn Ultra Thin Lancets MISC 1 Device by Does not apply route 4 (four) times daily -  before meals and at bedtime. relion brand  . rosuvastatin (CRESTOR) 10 MG tablet Take 10 mg by mouth daily.  . [DISCONTINUED] losartan (COZAAR) 50 MG tablet Take 1 tablet (50 mg) by mouth twice daily     Allergies:   Penicillins   Social History   Socioeconomic History  . Marital status: Divorced    Spouse name: Not on file  . Number of children: 2  . Years of education: Not on file  . Highest   education level: 9th grade  Occupational History  . Not on file  Tobacco Use  . Smoking status: Former Smoker    Packs/day: 1.00    Years: 10.00    Pack years: 10.00  . Smokeless tobacco: Never Used  Substance and Sexual Activity  . Alcohol use: Never  . Drug use: Never  . Sexual activity: Not Currently  Other Topics Concern  . Not on file  Social History Narrative   Lives at home    Social Determinants of Health   Financial Resource Strain: High Risk  . Difficulty of Paying Living Expenses: Hard  Food Insecurity: No Food Insecurity  . Worried About Charity fundraiser in the Last Year: Never true  . Ran Out of Food in the Last Year: Never true    Transportation Needs: No Transportation Needs  . Lack of Transportation (Medical): No  . Lack of Transportation (Non-Medical): No  Physical Activity: Unknown  . Days of Exercise per Week: 0 days  . Minutes of Exercise per Session: Not on file  Stress: No Stress Concern Present  . Feeling of Stress : Only a little  Social Connections: Unknown  . Frequency of Communication with Friends and Family: Three times a week  . Frequency of Social Gatherings with Friends and Family: More than three times a week  . Attends Religious Services: Never  . Active Member of Clubs or Organizations: Yes  . Attends Archivist Meetings: Never  . Marital Status: Not on file     Family History: The patient's family history includes Diabetes in her father and mother; Hypertension in her father and mother.  ROS:   Please see the history of present illness.     All other systems reviewed and are negative.  EKGs/Labs/Other Studies Reviewed:    The following studies were reviewed today:   EKG:  EKG is  ordered today.  The ekg ordered today demonstrates normal sinus rhythm, left bundle branch block, LVH per voltage criteria..  Recent Labs: 11/07/2018: TSH 0.93 02/20/2019: ALT 11; BUN 28; Creatinine, Ser 1.12; Hemoglobin 11.8; Platelets 320.0; Potassium 4.1; Sodium 138  Recent Lipid Panel    Component Value Date/Time   CHOL 160 02/20/2019 0851   TRIG 63.0 02/20/2019 0851   HDL 79.70 02/20/2019 0851   CHOLHDL 2 02/20/2019 0851   VLDL 12.6 02/20/2019 0851   LDLCALC 67 02/20/2019 0851    Physical Exam:    VS:  BP 110/80 (BP Location: Left Arm, Patient Position: Sitting, Cuff Size: Normal)   Pulse 98   Ht 5' 10" (1.778 m)   Wt 244 lb 6 oz (110.8 kg)   SpO2 99%   BMI 35.06 kg/m     Wt Readings from Last 3 Encounters:  04/02/19 244 lb 6 oz (110.8 kg)  03/27/19 243 lb (110.2 kg)  03/01/19 244 lb (110.7 kg)     GEN:  Well nourished, well developed in no acute distress, obese HEENT:  Normal NECK: No JVD; No carotid bruits LYMPHATICS: No lymphadenopathy CARDIAC: RRR, no murmurs, rubs, gallops RESPIRATORY:  Clear to auscultation without rales, wheezing or rhonchi  ABDOMEN: Soft, non-tender, non-distended MUSCULOSKELETAL:  No edema; No deformity  SKIN: Warm and dry NEUROLOGIC:  Alert and oriented x 3 PSYCHIATRIC:  Normal affect   ASSESSMENT:    1. Heart failure with reduced ejection fraction ([place])   2. Essential hypertension    PLAN:    In order of problems listed above:  1. Echocardiogram obtained due to  LVH on EKG.  Echo showed severely reduced ejection fraction with EF 25 to 30%.  Mild LVH.  Shin appears euvolemic.  She describes NYHA class II symptoms.  Stop losartan.  Start Entresto 49-51 twice daily. continue Coreg 25 twice daily.  Schedule patient for right and left heart cath to evaluate ischemic etiology.  We will plan to start Aldactone after left heart cath blood pressure permits. 2. History of hypertension, blood pressure is now controlled.  Entresto as above, amlodipine 10 mg daily, Coreg 25 mg twice daily, hydralazine..    Follow-up after heart cath.  This note was generated in part or whole with voice recognition software. Voice recognition is usually quite accurate but there are transcription errors that can and very often do occur. I apologize for any typographical errors that were not detected and corrected.  Medication Adjustments/Labs and Tests Ordered: Current medicines are reviewed at length with the patient today.  Concerns regarding medicines are outlined above.  Orders Placed This Encounter  Procedures  . Basic metabolic panel  . CBC w/Diff  . EKG 12-Lead   Meds ordered this encounter  Medications  . sacubitril-valsartan (ENTRESTO) 49-51 MG    Sig: Take 1 tablet by mouth 2 (two) times daily.    Dispense:  60 tablet    Refill:  5    STOP Losartan    Patient Instructions  Medication Instructions:  Your physician has recommended  you make the following change in your medication:   1) STOP Losartan  2) On Thursday 04/04/19 START Entresto 49/51 mg twice daily. An Rx has been sent to your pharmacy.  *If you need a refill on your cardiac medications before your next appointment, please call your pharmacy*   Lab Work: Bmet and Cbc today.  You will need a COVID test prior to your procedure. Please report to the [facility] drive up test site on 04/04/19 between 12:30-2:30 pm for testing.  If you have labs (blood work) drawn today and your tests are completely normal, you will receive your results only by: Marland Kitchen MyChart Message (if you have MyChart) OR . A paper copy in the mail If you have any lab test that is abnormal or we need to change your treatment, we will call you to review the results.   Testing/Procedures: Your physician has requested that you have a cardiac catheterization. Cardiac catheterization is used to diagnose and/or treat various heart conditions. Doctors may recommend this procedure for a number of different reasons. The most common reason is to evaluate chest pain. Chest pain can be a symptom of coronary artery disease (CAD), and cardiac catheterization can show whether plaque is narrowing or blocking your heart's arteries. This procedure is also used to evaluate the valves, as well as measure the blood flow and oxygen levels in different parts of your heart. For further information please visit [email or web address]. Please follow instruction sheet, as given.     Follow-Up: At [facility], you and your health needs are our priority.  As part of our continuing mission to provide you with exceptional heart care, we have created designated Provider Care Teams.  These Care Teams include your primary Cardiologist (physician) and Advanced Practice Providers (APPs -  Physician Assistants and Nurse Practitioners) who all work together to provide you with the care you need, when you need it.  We recommend signing up for  the patient portal called "MyChart".  Sign up information is provided on this After Visit Summary.  MyChart is used to connect  with patients for Virtual Visits (Telemedicine).  Patients are able to view lab/test results, encounter notes, upcoming appointments, etc.  Non-urgent messages can be sent to your provider as well.   To learn more about what you can do with MyChart, go to [email or web address].    Your next appointment:   3 week(s)  The format for your next appointment:   In Person  Provider:   Kate Sable, MD   Other Instructions [facility] Cardiac Cath Instructions   You are scheduled for a Cardiac Cath on: 04/08/19 @ 10:30am with Dr. Fletcher Anon  Please arrive at 9:30 am on the day of your procedure  Please expect a call from our [place] to pre-register you  Do not eat/drink anything after midnight  Someone will need to drive you home  It is recommended someone be with you for the first 24 hours after your procedure  Wear clothes that are easy to get on/off and wear slip on shoes if possible   Medications bring a current list of all medications with you   _X__ Do not take these medications before your procedure: Take 1/2 of your insulin dose the night before the procedure. Do not take any insulin the morning of the procedure   _X_ You may take all of your other medications the morning of your procedure with enough water to swallow safely   Day of your procedure: Arrive at the [place] entrance.  Free valet service is available.  After entering the [place] please check-in at the registration desk (1st desk on your right) to receive your armband. After receiving your armband someone will escort you to the cardiac cath/special procedures waiting area.  The usual length of stay after your procedure is about 2 to 3 hours.  This can vary.  If you have any questions, please call our office at [phone], or you may call the cardiac cath lab  at [facility] directly at [phone]      Signed, Kate Sable, MD  04/02/2019 10:22 AM    [place]

## 2019-04-02 NOTE — Patient Instructions (Addendum)
Medication Instructions:  Your physician has recommended you make the following change in your medication:   1) STOP Losartan  2) On Thursday 04/04/19 START Entresto 49/51 mg twice daily. An Rx has been sent to your pharmacy.  *If you need a refill on your cardiac medications before your next appointment, please call your pharmacy*   Lab Work: Bmet and Cbc today.  You will need a COVID test prior to your procedure. Please report to the [facility] drive up test site on 04/04/19 between 12:30-2:30 pm for testing.  If you have labs (blood work) drawn today and your tests are completely normal, you will receive your results only by: Marland Kitchen MyChart Message (if you have MyChart) OR . A paper copy in the mail If you have any lab test that is abnormal or we need to change your treatment, we will call you to review the results.   Testing/Procedures: Your physician has requested that you have a cardiac catheterization. Cardiac catheterization is used to diagnose and/or treat various heart conditions. Doctors may recommend this procedure for a number of different reasons. The most common reason is to evaluate chest pain. Chest pain can be a symptom of coronary artery disease (CAD), and cardiac catheterization can show whether plaque is narrowing or blocking your heart's arteries. This procedure is also used to evaluate the valves, as well as measure the blood flow and oxygen levels in different parts of your heart. For further information please visit [email or web address]. Please follow instruction sheet, as given.     Follow-Up: At [facility], you and your health needs are our priority.  As part of our continuing mission to provide you with exceptional heart care, we have created designated Provider Care Teams.  These Care Teams include your primary Cardiologist (physician) and Advanced Practice Providers (APPs -  Physician Assistants and Nurse Practitioners) who all work together to provide you with the care  you need, when you need it.  We recommend signing up for the patient portal called "MyChart".  Sign up information is provided on this After Visit Summary.  MyChart is used to connect with patients for Virtual Visits (Telemedicine).  Patients are able to view lab/test results, encounter notes, upcoming appointments, etc.  Non-urgent messages can be sent to your provider as well.   To learn more about what you can do with MyChart, go to [email or web address].    Your next appointment:   3 week(s)  The format for your next appointment:   In Person  Provider:   Kate Sable, MD   Other Instructions [facility] Cardiac Cath Instructions   You are scheduled for a Cardiac Cath on: 04/08/19 @ 10:30am with Dr. Fletcher Anon  Please arrive at 9:30 am on the day of your procedure  Please expect a call from our [place] to pre-register you  Do not eat/drink anything after midnight  Someone will need to drive you home  It is recommended someone be with you for the first 24 hours after your procedure  Wear clothes that are easy to get on/off and wear slip on shoes if possible   Medications bring a current list of all medications with you   _X__ Do not take these medications before your procedure: Take 1/2 of your insulin dose the night before the procedure. Do not take any insulin the morning of the procedure   _X_ You may take all of your other medications the morning of your procedure with enough water to swallow safely  Day of your procedure: Arrive at the [facility] entrance.  Free valet service is available.  After entering the [place] please check-in at the registration desk (1st desk on your right) to receive your armband. After receiving your armband someone will escort you to the cardiac cath/special procedures waiting area.  The usual length of stay after your procedure is about 2 to 3 hours.  This can vary.  If you have any questions, please call our  office at [phone], or you may call the cardiac cath lab at [facility] directly at [phone]

## 2019-04-02 NOTE — Progress Notes (Signed)
Cardiology Office Note:    Date:  04/02/2019   ID:  Nancy Barajas, DOB 25-Feb-1960, MRN [id number]  PCP:  McLean-Scocuzza, Nino Glow, MD  Cardiologist:  Kate Sable, MD  Electrophysiologist:  None   Referring MD: Jodelle Green, FNP   Chief Complaint  Patient presents with  . other    2 month follow up and discuss Echo results. "doing well."     History of Present Illness:    Nancy Barajas is a 59 y.o. female with a hx of hypertension, diabetes, hyperlipidemia, CKD stage III, who presents for follow-up.    She was originally seen due to uncontrolled hypertension.  Patient was diagnosed with hypertension about 4 years ago.  She has tried a couple of medications with some improvement but her blood pressure is still elevated.  She endorsed not eating healthy low-salt diet.  When diagnosed her blood pressures were usually in the 409W to 119J systolic.  At last visit, they range anywhere from 140s to 160s.  Losartan was titrated after last visit.  She was advised to taper of clonidine.  EKG showed LVH, echocardiogram was ordered.  She states feeling tired walking on level ground for long.  Denies edema, orthopnea.   Past Medical History:  Diagnosis Date  . Asthma   . Diabetes mellitus without complication ([place])   . Hypertension   . Kidney stones     Past Surgical History:  Procedure Laterality Date  . LITHOTRIPSY      Current Medications: Current Meds  Medication Sig  . amLODipine (NORVASC) 10 MG tablet Take 1 tablet (10 mg total) by mouth daily.  . blood glucose meter kit and supplies Dispense based on patient and insurance preference. Use up to four times daily as directed. (FOR ICD-10 E10.9, E11.9).  . carvedilol (COREG) 25 MG tablet Take 1 tablet (25 mg total) by mouth 2 (two) times daily with a meal.  . Cholecalciferol (VITAMIN D-3) 125 MCG (5000 UT) TABS Take 1 tablet by mouth daily.  . Dulaglutide (TRULICITY) 3 YN/8.2NF SOPN Inject 3 mg into the skin once a  week.  Marland Kitchen glucose blood test strip relion strips Use as instructed check sugar fasting in am and 15-20 minutes before lunch and dinner then at night  . hydrALAZINE (APRESOLINE) 100 MG tablet Take 1 tablet (100 mg total) by mouth 2 (two) times daily.  . insulin lispro (HUMALOG KWIKPEN) 100 UNIT/ML KwikPen Check sugar 15-20 minutes before meals and give insulin based on sugar reading 70-130 0 units, 131-180 4 units, 181-240 8 units, 241-300 10 units, 301-350 12 units, 351-400 16 units, >400 20 units and call the doctor  . Insulin Pen Needle (PEN NEEDLES) 31G X 8 MM MISC 1 Device by Does not apply route in the morning, at noon, and at bedtime. Use for humalog  . Multiple Vitamins-Minerals (CENTRUM SILVER 50+WOMEN PO) Take 1 tablet by mouth daily.  . ReliOn Ultra Thin Lancets MISC 1 Device by Does not apply route 4 (four) times daily -  before meals and at bedtime. relion brand  . rosuvastatin (CRESTOR) 10 MG tablet Take 10 mg by mouth daily.  . [DISCONTINUED] losartan (COZAAR) 50 MG tablet Take 1 tablet (50 mg) by mouth twice daily     Allergies:   Penicillins   Social History   Socioeconomic History  . Marital status: Divorced    Spouse name: Not on file  . Number of children: 2  . Years of education: Not on file  . Highest  education level: 9th grade  Occupational History  . Not on file  Tobacco Use  . Smoking status: Former Smoker    Packs/day: 1.00    Years: 10.00    Pack years: 10.00  . Smokeless tobacco: Never Used  Substance and Sexual Activity  . Alcohol use: Never  . Drug use: Never  . Sexual activity: Not Currently  Other Topics Concern  . Not on file  Social History Narrative   Lives at home    Social Determinants of Health   Financial Resource Strain: High Risk  . Difficulty of Paying Living Expenses: Hard  Food Insecurity: No Food Insecurity  . Worried About Charity fundraiser in the Last Year: Never true  . Ran Out of Food in the Last Year: Never true    Transportation Needs: No Transportation Needs  . Lack of Transportation (Medical): No  . Lack of Transportation (Non-Medical): No  Physical Activity: Unknown  . Days of Exercise per Week: 0 days  . Minutes of Exercise per Session: Not on file  Stress: No Stress Concern Present  . Feeling of Stress : Only a little  Social Connections: Unknown  . Frequency of Communication with Friends and Family: Three times a week  . Frequency of Social Gatherings with Friends and Family: More than three times a week  . Attends Religious Services: Never  . Active Member of Clubs or Organizations: Yes  . Attends Archivist Meetings: Never  . Marital Status: Not on file     Family History: The patient's family history includes Diabetes in her father and mother; Hypertension in her father and mother.  ROS:   Please see the history of present illness.     All other systems reviewed and are negative.  EKGs/Labs/Other Studies Reviewed:    The following studies were reviewed today:   EKG:  EKG is  ordered today.  The ekg ordered today demonstrates normal sinus rhythm, left bundle branch block, LVH per voltage criteria..  Recent Labs: 11/07/2018: TSH 0.93 02/20/2019: ALT 11; BUN 28; Creatinine, Ser 1.12; Hemoglobin 11.8; Platelets 320.0; Potassium 4.1; Sodium 138  Recent Lipid Panel    Component Value Date/Time   CHOL 160 02/20/2019 0851   TRIG 63.0 02/20/2019 0851   HDL 79.70 02/20/2019 0851   CHOLHDL 2 02/20/2019 0851   VLDL 12.6 02/20/2019 0851   LDLCALC 67 02/20/2019 0851    Physical Exam:    VS:  BP 110/80 (BP Location: Left Arm, Patient Position: Sitting, Cuff Size: Normal)   Pulse 98   Ht 5' 10" (1.778 m)   Wt 244 lb 6 oz (110.8 kg)   SpO2 99%   BMI 35.06 kg/m     Wt Readings from Last 3 Encounters:  04/02/19 244 lb 6 oz (110.8 kg)  03/27/19 243 lb (110.2 kg)  03/01/19 244 lb (110.7 kg)     GEN:  Well nourished, well developed in no acute distress, obese HEENT:  Normal NECK: No JVD; No carotid bruits LYMPHATICS: No lymphadenopathy CARDIAC: RRR, no murmurs, rubs, gallops RESPIRATORY:  Clear to auscultation without rales, wheezing or rhonchi  ABDOMEN: Soft, non-tender, non-distended MUSCULOSKELETAL:  No edema; No deformity  SKIN: Warm and dry NEUROLOGIC:  Alert and oriented x 3 PSYCHIATRIC:  Normal affect   ASSESSMENT:    1. Heart failure with reduced ejection fraction ([place])   2. Essential hypertension    PLAN:    In order of problems listed above:  1. Echocardiogram obtained due to  LVH on EKG.  Echo showed severely reduced ejection fraction with EF 25 to 30%.  Mild LVH.  Shin appears euvolemic.  She describes NYHA class II symptoms.  Stop losartan.  Start Entresto 49-51 twice daily. continue Coreg 25 twice daily.  Schedule patient for right and left heart cath to evaluate ischemic etiology.  We will plan to start Aldactone after left heart cath blood pressure permits. 2. History of hypertension, blood pressure is now controlled.  Entresto as above, amlodipine 10 mg daily, Coreg 25 mg twice daily, hydralazine..    Follow-up after heart cath.  This note was generated in part or whole with voice recognition software. Voice recognition is usually quite accurate but there are transcription errors that can and very often do occur. I apologize for any typographical errors that were not detected and corrected.  Medication Adjustments/Labs and Tests Ordered: Current medicines are reviewed at length with the patient today.  Concerns regarding medicines are outlined above.  Orders Placed This Encounter  Procedures  . Basic metabolic panel  . CBC w/Diff  . EKG 12-Lead   Meds ordered this encounter  Medications  . sacubitril-valsartan (ENTRESTO) 49-51 MG    Sig: Take 1 tablet by mouth 2 (two) times daily.    Dispense:  60 tablet    Refill:  5    STOP Losartan    Patient Instructions  Medication Instructions:  Your physician has recommended  you make the following change in your medication:   1) STOP Losartan  2) On Thursday 04/04/19 START Entresto 49/51 mg twice daily. An Rx has been sent to your pharmacy.  *If you need a refill on your cardiac medications before your next appointment, please call your pharmacy*   Lab Work: Bmet and Cbc today.  You will need a COVID test prior to your procedure. Please report to the [facility] drive up test site on 04/04/19 between 12:30-2:30 pm for testing.  If you have labs (blood work) drawn today and your tests are completely normal, you will receive your results only by: Marland Kitchen MyChart Message (if you have MyChart) OR . A paper copy in the mail If you have any lab test that is abnormal or we need to change your treatment, we will call you to review the results.   Testing/Procedures: Your physician has requested that you have a cardiac catheterization. Cardiac catheterization is used to diagnose and/or treat various heart conditions. Doctors may recommend this procedure for a number of different reasons. The most common reason is to evaluate chest pain. Chest pain can be a symptom of coronary artery disease (CAD), and cardiac catheterization can show whether plaque is narrowing or blocking your heart's arteries. This procedure is also used to evaluate the valves, as well as measure the blood flow and oxygen levels in different parts of your heart. For further information please visit [email or web address].[email or web address]. Please follow instruction sheet, as given.     Follow-Up: At [facility], you and your health needs are our priority.  As part of our continuing mission to provide you with exceptional heart care, we have created designated Provider Care Teams.  These Care Teams include your primary Cardiologist (physician) and Advanced Practice Providers (APPs -  Physician Assistants and Nurse Practitioners) who all work together to provide you with the care you need, when you need it.  We recommend signing up for  the patient portal called "MyChart".  Sign up information is provided on this After Visit Summary.  MyChart is used to connect  with patients for Virtual Visits (Telemedicine).  Patients are able to view lab/test results, encounter notes, upcoming appointments, etc.  Non-urgent messages can be sent to your provider as well.   To learn more about what you can do with MyChart, go to [email or web address].    Your next appointment:   3 week(s)  The format for your next appointment:   In Person  Provider:   Kate Sable, MD   Other Instructions [facility] Cardiac Cath Instructions   You are scheduled for a Cardiac Cath on: 04/08/19 @ 10:30am with Dr. Fletcher Anon  Please arrive at 9:30 am on the day of your procedure  Please expect a call from our [facility] to pre-register you  Do not eat/drink anything after midnight  Someone will need to drive you home  It is recommended someone be with you for the first 24 hours after your procedure  Wear clothes that are easy to get on/off and wear slip on shoes if possible   Medications bring a current list of all medications with you   _X__ Do not take these medications before your procedure: Take 1/2 of your insulin dose the night before the procedure. Do not take any insulin the morning of the procedure   _X_ You may take all of your other medications the morning of your procedure with enough water to swallow safely   Day of your procedure: Arrive at the [facility] entrance.  Free valet service is available.  After entering the [facility] please check-in at the registration desk (1st desk on your right) to receive your armband. After receiving your armband someone will escort you to the cardiac cath/special procedures waiting area.  The usual length of stay after your procedure is about 2 to 3 hours.  This can vary.  If you have any questions, please call our office at [phone], or you may call the cardiac cath lab  at [facility] directly at [phone]      Signed, Kate Sable, MD  04/02/2019 10:22 AM    [place]

## 2019-04-03 LAB — BASIC METABOLIC PANEL
BUN/Creatinine Ratio: 21 (ref 9–23)
BUN: 26 mg/dL — ABNORMAL HIGH (ref 6–24)
CO2: 23 mmol/L (ref 20–29)
Calcium: 9.6 mg/dL (ref 8.7–10.2)
Chloride: 101 mmol/L (ref 96–106)
Creatinine, Ser: 1.24 mg/dL — ABNORMAL HIGH (ref 0.57–1.00)
GFR calc Af Amer: 55 mL/min/{1.73_m2} — ABNORMAL LOW (ref >59)
GFR calc non Af Amer: 48 mL/min/{1.73_m2} — ABNORMAL LOW (ref >59)
Glucose: 163 mg/dL — ABNORMAL HIGH (ref 65–99)
Potassium: 4.5 mmol/L (ref 3.5–5.2)
Sodium: 138 mmol/L (ref 134–144)

## 2019-04-03 LAB — CBC WITH DIFFERENTIAL/PLATELET
Basophils Absolute: 0.1 10*3/uL (ref 0.0–0.2)
Basos: 1 %
EOS (ABSOLUTE): 0.1 10*3/uL (ref 0.0–0.4)
Eos: 1 %
Hematocrit: 38.9 % (ref 34.0–46.6)
Hemoglobin: 11.9 g/dL (ref 11.1–15.9)
Immature Grans (Abs): 0 10*3/uL (ref 0.0–0.1)
Immature Granulocytes: 0 %
Lymphocytes Absolute: 1.8 10*3/uL (ref 0.7–3.1)
Lymphs: 25 %
MCH: 22.2 pg — ABNORMAL LOW (ref 26.6–33.0)
MCHC: 30.6 g/dL — ABNORMAL LOW (ref 31.5–35.7)
MCV: 73 fL — ABNORMAL LOW (ref 79–97)
Monocytes Absolute: 0.4 10*3/uL (ref 0.1–0.9)
Monocytes: 5 %
Neutrophils Absolute: 4.9 10*3/uL (ref 1.4–7.0)
Neutrophils: 68 %
Platelets: 376 10*3/uL (ref 150–450)
RBC: 5.35 x10E6/uL — ABNORMAL HIGH (ref 3.77–5.28)
RDW: 16.7 % — ABNORMAL HIGH (ref 11.7–15.4)
WBC: 7.2 10*3/uL (ref 3.4–10.8)

## 2019-04-04 ENCOUNTER — Ambulatory Visit (INDEPENDENT_AMBULATORY_CARE_PROVIDER_SITE_OTHER): Payer: Medicare Other | Admitting: Internal Medicine

## 2019-04-04 ENCOUNTER — Other Ambulatory Visit: Payer: Self-pay

## 2019-04-04 ENCOUNTER — Encounter: Payer: Self-pay | Admitting: Internal Medicine

## 2019-04-04 ENCOUNTER — Other Ambulatory Visit
Admission: RE | Admit: 2019-04-04 | Discharge: 2019-04-04 | Disposition: A | Discharge status: Home / Self Care | Payer: Medicare Other | Source: Ambulatory Visit | Attending: Cardiology | Admitting: Cardiology

## 2019-04-04 VITALS — BP 142/90 | HR 97 | Temp 96.7°F | Ht 70.0 in | Wt 242.8 lb

## 2019-04-04 DIAGNOSIS — I10 Essential (primary) hypertension: Secondary | ICD-10-CM | POA: Diagnosis not present

## 2019-04-04 DIAGNOSIS — F329 Major depressive disorder, single episode, unspecified: Secondary | ICD-10-CM | POA: Diagnosis not present

## 2019-04-04 DIAGNOSIS — F419 Anxiety disorder, unspecified: Secondary | ICD-10-CM

## 2019-04-04 DIAGNOSIS — Z20822 Contact with and (suspected) exposure to covid-19: Secondary | ICD-10-CM | POA: Diagnosis not present

## 2019-04-04 DIAGNOSIS — F32A Depression, unspecified: Secondary | ICD-10-CM

## 2019-04-04 DIAGNOSIS — E1165 Type 2 diabetes mellitus with hyperglycemia: Secondary | ICD-10-CM

## 2019-04-04 DIAGNOSIS — Z01812 Encounter for preprocedural laboratory examination: Secondary | ICD-10-CM | POA: Insufficient documentation

## 2019-04-04 MED ORDER — CARVEDILOL PHOSPHATE ER 40 MG PO CP24: 40.0000 mg | ORAL_CAPSULE | Freq: Every day | ORAL | 3 refills | Status: DC

## 2019-04-04 MED ORDER — FREESTYLE LIBRE 14 DAY SENSOR MISC: 1.0000 | 11 refills | Status: DC

## 2019-04-04 MED ORDER — FREESTYLE LIBRE 14 DAY READER DEVI: 1.0000 | 11 refills | Status: DC

## 2019-04-04 NOTE — Patient Instructions (Addendum)
Dr. Nicolasa Ducking psychiatry  Only change is stop coreg 25 mg 2x per day and change to extended release 1x per day     DASH Eating Plan DASH stands for "Dietary Approaches to Stop Hypertension." The DASH eating plan is a healthy eating plan that has been shown to reduce high blood pressure (hypertension). It may also reduce your risk for type 2 diabetes, heart disease, and stroke. The DASH eating plan may also help with weight loss. What are tips for following this plan?  General guidelines  Avoid eating more than 2,300 mg (milligrams) of salt (sodium) a day. If you have hypertension, you may need to reduce your sodium intake to 1,500 mg a day.  Limit alcohol intake to no more than 1 drink a day for nonpregnant women and 2 drinks a day for men. One drink equals 12 oz of beer, 5 oz of wine, or 1 oz of hard liquor.  Work with your health care provider to maintain a healthy body weight or to lose weight. Ask what an ideal weight is for you.  Get at least 30 minutes of exercise that causes your heart to beat faster (aerobic exercise) most days of the week. Activities may include walking, swimming, or biking.  Work with your health care provider or diet and nutrition specialist (dietitian) to adjust your eating plan to your individual calorie needs. Reading food labels   Check food labels for the amount of sodium per serving. Choose foods with less than 5 percent of the Daily Value of sodium. Generally, foods with less than 300 mg of sodium per serving fit into this eating plan.  To find whole grains, look for the word "whole" as the first word in the ingredient list. Shopping  Buy products labeled as "low-sodium" or "no salt added."  Buy fresh foods. Avoid canned foods and premade or frozen meals. Cooking  Avoid adding salt when cooking. Use salt-free seasonings or herbs instead of table salt or sea salt. Check with your health care provider or pharmacist before using salt substitutes.  Do  not fry foods. Cook foods using healthy methods such as baking, boiling, grilling, and broiling instead.  Cook with heart-healthy oils, such as olive, canola, soybean, or sunflower oil. Meal planning  Eat a balanced diet that includes: ? 5 or more servings of fruits and vegetables each day. At each meal, try to fill half of your plate with fruits and vegetables. ? Up to 6-8 servings of whole grains each day. ? Less than 6 oz of lean meat, poultry, or fish each day. A 3-oz serving of meat is about the same size as a deck of cards. One egg equals 1 oz. ? 2 servings of low-fat dairy each day. ? A serving of nuts, seeds, or beans 5 times each week. ? Heart-healthy fats. Healthy fats called Omega-3 fatty acids are found in foods such as flaxseeds and coldwater fish, like sardines, salmon, and mackerel.  Limit how much you eat of the following: ? Canned or prepackaged foods. ? Food that is high in trans fat, such as fried foods. ? Food that is high in saturated fat, such as fatty meat. ? Sweets, desserts, sugary drinks, and other foods with added sugar. ? Full-fat dairy products.  Do not salt foods before eating.  Try to eat at least 2 vegetarian meals each week.  Eat more home-cooked food and less restaurant, buffet, and fast food.  When eating at a restaurant, ask that your food be prepared  with less salt or no salt, if possible. What foods are recommended? The items listed may not be a complete list. Talk with your dietitian about what dietary choices are best for you. Grains Whole-grain or whole-wheat bread. Whole-grain or whole-wheat pasta. Brown rice. [place]. Bulgur. Whole-grain and low-sodium cereals. Pita bread. Low-fat, low-sodium crackers. Whole-wheat flour tortillas. Vegetables Fresh or frozen vegetables (raw, steamed, roasted, or grilled). Low-sodium or reduced-sodium tomato and vegetable juice. Low-sodium or reduced-sodium tomato sauce and tomato paste. Low-sodium or  reduced-sodium canned vegetables. Fruits All fresh, dried, or frozen fruit. Canned fruit in natural juice (without added sugar). Meat and other protein foods Skinless chicken or [place]. Ground chicken or [place]. Pork with fat trimmed off. Fish and seafood. Egg whites. Dried beans, peas, or lentils. Unsalted nuts, nut butters, and seeds. Unsalted canned beans. Lean cuts of beef with fat trimmed off. Low-sodium, lean deli meat. Dairy Low-fat (1%) or fat-free (skim) milk. Fat-free, low-fat, or reduced-fat cheeses. Nonfat, low-sodium ricotta or cottage cheese. Low-fat or nonfat yogurt. Low-fat, low-sodium cheese. Fats and oils Soft margarine without trans fats. Vegetable oil. Low-fat, reduced-fat, or light mayonnaise and salad dressings (reduced-sodium). Canola, safflower, olive, soybean, and sunflower oils. Avocado. Seasoning and other foods Herbs. Spices. Seasoning mixes without salt. Unsalted popcorn and pretzels. Fat-free sweets. What foods are not recommended? The items listed may not be a complete list. Talk with your dietitian about what dietary choices are best for you. Grains Baked goods made with fat, such as croissants, muffins, or some breads. Dry pasta or rice meal packs. Vegetables Creamed or fried vegetables. Vegetables in a cheese sauce. Regular canned vegetables (not low-sodium or reduced-sodium). Regular canned tomato sauce and paste (not low-sodium or reduced-sodium). Regular tomato and vegetable juice (not low-sodium or reduced-sodium). Angie Fava. Olives. Fruits Canned fruit in a light or heavy syrup. Fried fruit. Fruit in cream or butter sauce. Meat and other protein foods Fatty cuts of meat. Ribs. Fried meat. Berniece Salines. Sausage. Bologna and other processed lunch meats. Salami. Fatback. Hotdogs. Bratwurst. Salted nuts and seeds. Canned beans with added salt. Canned or smoked fish. Whole eggs or egg yolks. Chicken or [place] with skin. Dairy Whole or 2% milk, cream, and half-and-half.  Whole or full-fat cream cheese. Whole-fat or sweetened yogurt. Full-fat cheese. Nondairy creamers. Whipped toppings. Processed cheese and cheese spreads. Fats and oils Butter. Stick margarine. Lard. Shortening. Ghee. Bacon fat. Tropical oils, such as coconut, palm kernel, or palm oil. Seasoning and other foods Salted popcorn and pretzels. Onion salt, garlic salt, seasoned salt, table salt, and sea salt. Worcestershire sauce. Tartar sauce. Barbecue sauce. Teriyaki sauce. Soy sauce, including reduced-sodium. Steak sauce. Canned and packaged gravies. Fish sauce. Oyster sauce. Cocktail sauce. Horseradish that you find on the shelf. Ketchup. Mustard. Meat flavorings and tenderizers. Bouillon cubes. Hot sauce and Tabasco sauce. Premade or packaged marinades. Premade or packaged taco seasonings. Relishes. Regular salad dressings. Where to find more information:  National Heart, Lung, and [place]: https://wilson-eaton.com/  American Heart Association: www.heart.org Summary  The DASH eating plan is a healthy eating plan that has been shown to reduce high blood pressure (hypertension). It may also reduce your risk for type 2 diabetes, heart disease, and stroke.  With the DASH eating plan, you should limit salt (sodium) intake to 2,300 mg a day. If you have hypertension, you may need to reduce your sodium intake to 1,500 mg a day.  When on the DASH eating plan, aim to eat more fresh fruits and vegetables, whole grains, lean  proteins, low-fat dairy, and heart-healthy fats.  Work with your health care provider or diet and nutrition specialist (dietitian) to adjust your eating plan to your individual calorie needs. This information is not intended to replace advice given to you by your health care provider. Make sure you discuss any questions you have with your health care provider. Document Revised: 12/02/2016 Document Reviewed: 12/14/2015 Elsevier Patient Education  2020 [employer].

## 2019-04-04 NOTE — Progress Notes (Addendum)
Chief Complaint  Patient presents with  . Follow-up   F/u  1. HTN elevated on norvasc 10, coreg 25 mg bid, hydralazine 100 mg bid, entresto 49-51 mg qd pending cath 04/08/19 with Dr. Fletcher Anon  [place] 03/12/19 negative RAS she does report high salt diet at times   2. Dm 2 uncontrolled N2T >8 on trulicity 3 mg 1x per week and humalog using 4-8 units 2-3 x per day 1x had to use 10 checking cbg 3-4 x per day and reviewed log x 30 days range 103 to 130s-140s in the am to highs 240s to 200s lunch and dinner  Results for DEVONNE, KITCHEN (MRN [id number]) as of 04/04/2019 10:50  01/22/2018 10:07 Hemoglobin A1C: 7.7 (H)  08/07/2018 08:44 Hemoglobin A1C: 8.1 (H)  11/07/2018 08:30 Hemoglobin A1C: 8.0 (H)  02/20/2019 08:51 Hemoglobin A1C: 8.6 (H)  3. Anxiety/depression seeing osman here appt sch 04/10/10 was on zoloft for 30 years will refer to psych  Gad 7 score 11 today and phq 9 score 13 c/o feeling lonely lives with 59 y.o daughter which is also a stessor  Review of Systems  Constitutional: Negative for weight loss.  HENT: Negative for hearing loss.   Eyes: Negative for blurred vision.  Respiratory: Negative for shortness of breath.   Cardiovascular: Negative for chest pain and leg swelling.  Gastrointestinal: Negative for abdominal pain.  Musculoskeletal: Negative for falls.  Skin: Negative for rash.  Psychiatric/Behavioral: Positive for depression. The patient is nervous/anxious.    Past Medical History:  Diagnosis Date  . Asthma   . Diabetes mellitus without complication ([place])   . Hypertension   . Kidney stones    Past Surgical History:  Procedure Laterality Date  . LITHOTRIPSY     Family History  Problem Relation Age of Onset  . Hypertension Mother   . Diabetes Mother   . Hypertension Father   . Diabetes Father    Social History   Socioeconomic History  . Marital status: Divorced    Spouse name: Not on file  . Number of children: 2  . Years of education: Not on file  . Highest  education level: 9th grade  Occupational History  . Not on file  Tobacco Use  . Smoking status: Former Smoker    Packs/day: 1.00    Years: 10.00    Pack years: 10.00  . Smokeless tobacco: Never Used  Substance and Sexual Activity  . Alcohol use: Never  . Drug use: Never  . Sexual activity: Not Currently  Other Topics Concern  . Not on file  Social History Narrative   Lives at home    2 daughters as of 04/04/19 59 y.o daughter lives with her    Social Determinants of Health   Financial Resource Strain: High Risk  . Difficulty of Paying Living Expenses: Hard  Food Insecurity: No Food Insecurity  . Worried About Charity fundraiser in the Last Year: Never true  . Ran Out of Food in the Last Year: Never true  Transportation Needs: No Transportation Needs  . Lack of Transportation (Medical): No  . Lack of Transportation (Non-Medical): No  Physical Activity: Unknown  . Days of Exercise per Week: 0 days  . Minutes of Exercise per Session: Not on file  Stress: No Stress Concern Present  . Feeling of Stress : Only a little  Social Connections: Unknown  . Frequency of Communication with Friends and Family: Three times a week  . Frequency of Social Gatherings with Friends and  Family: More than three times a week  . Attends Religious Services: Never  . Active Member of Clubs or Organizations: Yes  . Attends Archivist Meetings: Never  . Marital Status: Not on file  Intimate Partner Violence: Not At Risk  . Fear of Current or Ex-Partner: No  . Emotionally Abused: No  . Physically Abused: No  . Sexually Abused: No   Current Meds  Medication Sig  . amLODipine (NORVASC) 10 MG tablet Take 1 tablet (10 mg total) by mouth daily.  . blood glucose meter kit and supplies Dispense based on patient and insurance preference. Use up to four times daily as directed. (FOR ICD-10 E10.9, E11.9).  . carvedilol (COREG) 25 MG tablet Take 1 tablet (25 mg total) by mouth 2 (two) times daily  with a meal.  . Cholecalciferol (VITAMIN D-3) 125 MCG (5000 UT) TABS Take 1 tablet by mouth daily. (Patient taking differently: Take 5,000 Units by mouth daily. )  . Dulaglutide (TRULICITY) 3 QI/2.9NL SOPN Inject 3 mg into the skin once a week. (Patient taking differently: Inject 3 mg into the skin once a week. Sunday)  . glucose blood test strip relion strips Use as instructed check sugar fasting in am and 15-20 minutes before lunch and dinner then at night  . hydrALAZINE (APRESOLINE) 100 MG tablet Take 1 tablet (100 mg total) by mouth 2 (two) times daily.  . insulin lispro (HUMALOG KWIKPEN) 100 UNIT/ML KwikPen Check sugar 15-20 minutes before meals and give insulin based on sugar reading 70-130 0 units, 131-180 4 units, 181-240 8 units, 241-300 10 units, 301-350 12 units, 351-400 16 units, >400 20 units and call the doctor (Patient taking differently: Inject 4-16 Units into the skin See admin instructions. Check sugar 15-20 minutes before meals and give insulin based on sugar reading 70-130 0 units, 131-180 4 units, 181-240 8 units, 241-300 10 units, 301-350 12 units, 351-400 16 units, >400 20 units and call the doctor)  . Insulin Pen Needle (PEN NEEDLES) 31G X 8 MM MISC 1 Device by Does not apply route in the morning, at noon, and at bedtime. Use for humalog  . Multiple Vitamins-Minerals (CENTRUM SILVER 50+WOMEN PO) Take 1 tablet by mouth daily.  . ReliOn Ultra Thin Lancets MISC 1 Device by Does not apply route 4 (four) times daily -  before meals and at bedtime. relion brand  . rosuvastatin (CRESTOR) 10 MG tablet Take 10 mg by mouth every evening.   . sacubitril-valsartan (ENTRESTO) 49-51 MG Take 1 tablet by mouth 2 (two) times daily.  . sodium chloride (OCEAN) 0.65 % SOLN nasal spray Place 1 spray into both nostrils as needed for congestion.   Allergies  Allergen Reactions  . Penicillins Rash    Did it involve swelling of the face/tongue/throat, SOB, or low BP? No Did it involve sudden or  severe rash/hives, skin peeling, or any reaction on the inside of your mouth or nose? No Did you need to seek medical attention at a hospital or doctor's office? No When did it last happen?Childhood If all above answers are "NO", may proceed with cephalosporin use.   Recent Results (from the past 2160 hour(s))  HM DIABETES EYE EXAM     Status: None   Collection Time: 01/10/19 12:00 AM  Result Value Ref Range   HM Diabetic Eye Exam No Retinopathy No Retinopathy    Comment: [facility].  Iron, TIBC and Ferritin Panel     Status: None   Collection Time:  02/20/19  8:51 AM  Result Value Ref Range   Iron 54 45 - 160 mcg/dL   TIBC 289 250 - 450 mcg/dL (calc)   %SAT 19 16 - 45 % (calc)   Ferritin 48 16 - 232 ng/mL  HgB A1c     Status: Abnormal   Collection Time: 02/20/19  8:51 AM  Result Value Ref Range   Hgb A1c MFr Bld 8.6 (H) 4.6 - 6.5 %    Comment: Glycemic Control Guidelines for People with Diabetes:Non Diabetic:  <6%Goal of Therapy: <7%Additional Action Suggested:  >8%   Microalbumin / creatinine urine ratio     Status: Abnormal   Collection Time: 02/20/19  8:51 AM  Result Value Ref Range   Creatinine, Urine 104 20 - 275 mg/dL   Microalb, Ur 28.8 mg/dL    Comment: Reference Range Not established    Microalb Creat Ratio 277 (H) <30 mcg/mg creat    Comment: . The ADA defines abnormalities in albumin excretion as follows: Marland Kitchen Category         Result (mcg/mg creatinine) . Normal                    <30 Microalbuminuria         30-299  Clinical albuminuria   > OR = 300 . The ADA recommends that at least two of three specimens collected within a 3-6 month period be abnormal before considering a patient to be within a diagnostic category.   Urinalysis, Routine w reflex microscopic     Status: Abnormal   Collection Time: 02/20/19  8:51 AM  Result Value Ref Range   Color, Urine YELLOW YELLOW   APPearance CLOUDY (A) CLEAR   Specific Gravity, Urine 1.017 1.001 - 1.03    pH 5.5 5.0 - 8.0   Glucose, UA NEGATIVE NEGATIVE   Bilirubin Urine NEGATIVE NEGATIVE   Ketones, ur NEGATIVE NEGATIVE   Hgb urine dipstick 3+ (A) NEGATIVE   Protein, ur 2+ (A) NEGATIVE   Nitrite NEGATIVE NEGATIVE   Leukocytes,Ua 1+ (A) NEGATIVE   WBC, UA 20-40 (A) 0 - 5 /HPF   RBC / HPF 3-10 (A) 0 - 2 /HPF   Squamous Epithelial / LPF 0-5 < OR = 5 /HPF   Bacteria, UA NONE SEEN NONE SEEN /HPF   Hyaline Cast NONE SEEN NONE SEEN /LPF  Lipid panel     Status: None   Collection Time: 02/20/19  8:51 AM  Result Value Ref Range   Cholesterol 160 0 - 200 mg/dL    Comment: ATP III Classification       Desirable:  < 200 mg/dL               Borderline High:  200 - 239 mg/dL          High:  > = 240 mg/dL   Triglycerides 63.0 0.0 - 149.0 mg/dL    Comment: Normal:  <150 mg/dLBorderline High:  150 - 199 mg/dL   HDL 79.70 >39.00 mg/dL   VLDL 12.6 0.0 - 40.0 mg/dL   LDL Cholesterol 67 0 - 99 mg/dL   Total CHOL/HDL Ratio 2     Comment:                Men          Women1/2 Average Risk     3.4          3.3Average Risk          5.0  4.42X Average Risk          9.6          7.13X Average Risk          15.0          11.0                       NonHDL 79.89     Comment: NOTE:  Non-HDL goal should be 30 mg/dL higher than patient's LDL goal (i.e. LDL goal of < 70 mg/dL, would have non-HDL goal of < 100 mg/dL)  CBC with Differential/Platelet     Status: Abnormal   Collection Time: 02/20/19  8:51 AM  Result Value Ref Range   WBC 6.7 4.0 - 10.5 K/uL   RBC 5.27 (H) 3.87 - 5.11 Mil/uL   Hemoglobin 11.8 (L) 12.0 - 15.0 g/dL   HCT 37.3 36.0 - 46.0 %   MCV 70.9 (L) 78.0 - 100.0 fl   MCHC 31.5 30.0 - 36.0 g/dL   RDW 16.5 (H) 11.5 - 15.5 %   Platelets 320.0 150.0 - 400.0 K/uL   Neutrophils Relative % 61.6 43.0 - 77.0 %   Lymphocytes Relative 29.7 12.0 - 46.0 %   Monocytes Relative 6.8 3.0 - 12.0 %   Eosinophils Relative 1.0 0.0 - 5.0 %   Basophils Relative 0.9 0.0 - 3.0 %   Neutro Abs 4.1 1.4 - 7.7  K/uL   Lymphs Abs 2.0 0.7 - 4.0 K/uL   Monocytes Absolute 0.4 0.1 - 1.0 K/uL   Eosinophils Absolute 0.1 0.0 - 0.7 K/uL   Basophils Absolute 0.1 0.0 - 0.1 K/uL  Comprehensive metabolic panel     Status: Abnormal   Collection Time: 02/20/19  8:51 AM  Result Value Ref Range   Sodium 138 135 - 145 mEq/L   Potassium 4.1 3.5 - 5.1 mEq/L   Chloride 103 96 - 112 mEq/L   CO2 30 19 - 32 mEq/L   Glucose, Bld 156 (H) 70 - 99 mg/dL   BUN 28 (H) 6 - 23 mg/dL   Creatinine, Ser 1.12 0.40 - 1.20 mg/dL   Total Bilirubin 0.6 0.2 - 1.2 mg/dL   Alkaline Phosphatase 74 39 - 117 U/L   AST 11 0 - 37 U/L   ALT 11 0 - 35 U/L   Total Protein 7.6 6.0 - 8.3 g/dL   Albumin 4.2 3.5 - 5.2 g/dL   GFR 49.74 (L) >60.00 mL/min   Calcium 9.4 8.4 - 10.5 mg/dL  Urine Culture     Status: None   Collection Time: 02/26/19  8:27 AM   Specimen: Urine  Result Value Ref Range   MICRO NUMBER: [id number]    SPECIMEN QUALITY: Adequate    Sample Source NOT GIVEN    STATUS: FINAL    ISOLATE 1:      Growth of mixed flora was isolated, suggesting probable contamination. No further testing will be performed. If clinically indicated, recollection using a method to minimize contamination, with prompt transfer to Urine Culture Transport Tube, is  recommended.   Basic metabolic panel     Status: Abnormal   Collection Time: 04/02/19 10:10 AM  Result Value Ref Range   Glucose 163 (H) 65 - 99 mg/dL   BUN 26 (H) 6 - 24 mg/dL   Creatinine, Ser 1.24 (H) 0.57 - 1.00 mg/dL   GFR calc non Af Amer 48 (L) >59 mL/min/1.73   GFR calc Af Amer 55 (L) >59  mL/min/1.73   BUN/Creatinine Ratio 21 9 - 23   Sodium 138 134 - 144 mmol/L   Potassium 4.5 3.5 - 5.2 mmol/L   Chloride 101 96 - 106 mmol/L   CO2 23 20 - 29 mmol/L   Calcium 9.6 8.7 - 10.2 mg/dL  CBC w/Diff     Status: Abnormal   Collection Time: 04/02/19 10:10 AM  Result Value Ref Range   WBC 7.2 3.4 - 10.8 x10E3/uL   RBC 5.35 (H) 3.77 - 5.28 x10E6/uL   Hemoglobin 11.9 11.1 - 15.9 g/dL    Hematocrit 38.9 34.0 - 46.6 %   MCV 73 (L) 79 - 97 fL   MCH 22.2 (L) 26.6 - 33.0 pg   MCHC 30.6 (L) 31.5 - 35.7 g/dL   RDW 16.7 (H) 11.7 - 15.4 %   Platelets 376 150 - 450 x10E3/uL   Neutrophils 68 Not Estab. %   Lymphs 25 Not Estab. %   Monocytes 5 Not Estab. %   Eos 1 Not Estab. %   Basos 1 Not Estab. %   Neutrophils Absolute 4.9 1.4 - 7.0 x10E3/uL   Lymphocytes Absolute 1.8 0.7 - 3.1 x10E3/uL   Monocytes Absolute 0.4 0.1 - 0.9 x10E3/uL   EOS (ABSOLUTE) 0.1 0.0 - 0.4 x10E3/uL   Basophils Absolute 0.1 0.0 - 0.2 x10E3/uL   Immature Granulocytes 0 Not Estab. %   Immature Grans (Abs) 0.0 0.0 - 0.1 x10E3/uL   Objective  Body mass index is 34.84 kg/m. Wt Readings from Last 3 Encounters:  04/04/19 242 lb 12.8 oz (110.1 kg)  04/02/19 244 lb 6 oz (110.8 kg)  03/27/19 243 lb (110.2 kg)   Temp Readings from Last 3 Encounters:  04/04/19 (!) 96.7 F (35.9 C) (Temporal)  11/07/18 (!) 96.7 F (35.9 C) (Temporal)  08/22/18 (!) 97.5 F (36.4 C)   BP Readings from Last 3 Encounters:  04/04/19 (!) 142/90  04/02/19 110/80  03/01/19 (!) 157/98   Pulse Readings from Last 3 Encounters:  04/04/19 97  04/02/19 98  03/01/19 94    Physical Exam Vitals and nursing note reviewed.  Constitutional:      Appearance: Normal appearance. She is well-developed and well-groomed. She is obese.  HENT:     Head: Normocephalic and atraumatic.  Eyes:     Conjunctiva/sclera: Conjunctivae normal.     Pupils: Pupils are equal, round, and reactive to light.  Cardiovascular:     Rate and Rhythm: Normal rate and regular rhythm.     Heart sounds: Normal heart sounds. No murmur.  Pulmonary:     Effort: Pulmonary effort is normal.     Breath sounds: Normal breath sounds.  Skin:    General: Skin is warm and dry.  Neurological:     General: No focal deficit present.     Mental Status: She is alert and oriented to person, place, and time. Mental status is at baseline.     Gait: Gait normal.   Psychiatric:        Attention and Perception: Attention and perception normal.        Mood and Affect: Mood and affect normal.        Speech: Speech normal.        Behavior: Behavior normal. Behavior is cooperative.        Thought Content: Thought content normal.        Cognition and Memory: Cognition and memory normal.        Judgment: Judgment normal.  Assessment  Plan  Essential hypertension - Plan: carvedilol (COREG CR) 40 MG 24 hr capsule Consider increase to 80 mg CR if needed  Cont other meds   Anxiety and depression - Plan: Ambulatory referral to Psychiatry Dr. Nicolasa Ducking  F/u osman therapy here   Type 2 diabetes mellitus with hyperglycemia and CKD3- Plan: Continuous Blood Gluc Receiver (FREESTYLE LIBRE 14 DAY READER) DEVI, Continuous Blood Gluc Sensor (FREESTYLE LIBRE 14 DAY SENSOR) MISC  Cont meds trulicity 3 mg 1x per week and SSI M  Addendum 04/25/19 noted checking cbg 4x per day and insulin injections 3x per day  HM  Flu utd Consider: Tdap  shingrix  covid considering not had yet   mammo ordered call to schedule  Pap 02/05/2018 negative  Colonoscopy never had due to anxiety   Consider thyroid US in future was ordered previously   Provider: Dr. Olivia Mackie McLean-Scocuzza-Internal Medicine

## 2019-04-05 LAB — SARS CORONAVIRUS 2 (TAT 6-24 HRS): SARS Coronavirus 2: NEGATIVE

## 2019-04-06 ENCOUNTER — Other Ambulatory Visit: Payer: Self-pay

## 2019-04-06 ENCOUNTER — Encounter (HOSPITAL_COMMUNITY): Payer: Self-pay | Admitting: Psychiatry

## 2019-04-06 ENCOUNTER — Ambulatory Visit (INDEPENDENT_AMBULATORY_CARE_PROVIDER_SITE_OTHER): Payer: Medicare Other | Admitting: Psychiatry

## 2019-04-06 DIAGNOSIS — F411 Generalized anxiety disorder: Secondary | ICD-10-CM | POA: Diagnosis not present

## 2019-04-06 DIAGNOSIS — F32A Depression, unspecified: Secondary | ICD-10-CM

## 2019-04-06 DIAGNOSIS — F329 Major depressive disorder, single episode, unspecified: Secondary | ICD-10-CM

## 2019-04-06 DIAGNOSIS — F39 Unspecified mood [affective] disorder: Secondary | ICD-10-CM | POA: Insufficient documentation

## 2019-04-06 MED ORDER — TRAZODONE HCL 100 MG PO TABS: 100.0000 mg | ORAL_TABLET | Freq: Every evening | ORAL | 1 refills | Status: DC | PRN

## 2019-04-06 MED ORDER — SERTRALINE HCL 100 MG PO TABS: ORAL_TABLET | ORAL | 0 refills | Status: DC
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
## 2019-04-08 ENCOUNTER — Encounter
Admission: RE | Disposition: A | Discharge status: Home / Self Care | Payer: Self-pay | Source: Home / Self Care | Attending: Cardiovascular Disease

## 2019-04-08 ENCOUNTER — Other Ambulatory Visit: Payer: Self-pay

## 2019-04-08 ENCOUNTER — Ambulatory Visit
Admission: RE | Admit: 2019-04-08 | Discharge: 2019-04-08 | Disposition: A | Discharge status: Home / Self Care | Payer: Medicare Other | Attending: Cardiovascular Disease | Admitting: Cardiovascular Disease

## 2019-04-08 ENCOUNTER — Encounter: Payer: Self-pay | Admitting: Cardiovascular Disease

## 2019-04-08 DIAGNOSIS — Z79899 Other long term (current) drug therapy: Secondary | ICD-10-CM | POA: Diagnosis not present

## 2019-04-08 DIAGNOSIS — I509 Heart failure, unspecified: Secondary | ICD-10-CM | POA: Insufficient documentation

## 2019-04-08 DIAGNOSIS — Z880 Allergy status to penicillin: Secondary | ICD-10-CM | POA: Diagnosis not present

## 2019-04-08 DIAGNOSIS — N183 Chronic kidney disease, stage 3 unspecified: Secondary | ICD-10-CM | POA: Insufficient documentation

## 2019-04-08 DIAGNOSIS — I429 Cardiomyopathy, unspecified: Secondary | ICD-10-CM

## 2019-04-08 DIAGNOSIS — Z794 Long term (current) use of insulin: Secondary | ICD-10-CM | POA: Insufficient documentation

## 2019-04-08 DIAGNOSIS — I272 Pulmonary hypertension, unspecified: Secondary | ICD-10-CM | POA: Diagnosis not present

## 2019-04-08 DIAGNOSIS — I5020 Unspecified systolic (congestive) heart failure: Secondary | ICD-10-CM | POA: Diagnosis not present

## 2019-04-08 DIAGNOSIS — E785 Hyperlipidemia, unspecified: Secondary | ICD-10-CM | POA: Insufficient documentation

## 2019-04-08 DIAGNOSIS — E1122 Type 2 diabetes mellitus with diabetic chronic kidney disease: Secondary | ICD-10-CM | POA: Diagnosis not present

## 2019-04-08 DIAGNOSIS — J45909 Unspecified asthma, uncomplicated: Secondary | ICD-10-CM | POA: Diagnosis not present

## 2019-04-08 DIAGNOSIS — I130 Hypertensive heart and chronic kidney disease with heart failure and stage 1 through stage 4 chronic kidney disease, or unspecified chronic kidney disease: Secondary | ICD-10-CM | POA: Insufficient documentation

## 2019-04-08 DIAGNOSIS — Z87891 Personal history of nicotine dependence: Secondary | ICD-10-CM | POA: Diagnosis not present

## 2019-04-08 HISTORY — DX: Dizziness and giddiness: R42

## 2019-04-08 HISTORY — DX: Other diseases of vocal cords: J38.3

## 2019-04-08 HISTORY — PX: RIGHT/LEFT HEART CATH AND CORONARY ANGIOGRAPHY: CATH118266

## 2019-04-08 LAB — GLUCOSE, CAPILLARY: Glucose-Capillary: 198 mg/dL — ABNORMAL HIGH (ref 70–99)

## 2019-04-08 SURGERY — RIGHT/LEFT HEART CATH AND CORONARY ANGIOGRAPHY
Anesthesia: Moderate Sedation | Laterality: Bilateral

## 2019-04-08 MED ORDER — SODIUM CHLORIDE 0.9 % IV SOLN: INTRAVENOUS | Status: DC

## 2019-04-08 MED ORDER — IOHEXOL 300 MG/ML  SOLN
INTRAMUSCULAR | Status: DC | PRN
  Administered 2019-04-08: 50 mL

## 2019-04-08 MED ORDER — HEPARIN SODIUM (PORCINE) 1000 UNIT/ML IJ SOLN
INTRAMUSCULAR | Status: DC | PRN
  Administered 2019-04-08: 5000 [IU] via INTRAVENOUS

## 2019-04-08 MED ORDER — SODIUM CHLORIDE 0.9 % IV SOLN: 250.0000 mL | INTRAVENOUS | Status: DC | PRN

## 2019-04-08 MED ORDER — VERAPAMIL HCL 2.5 MG/ML IV SOLN
INTRAVENOUS | Status: DC | PRN
  Administered 2019-04-08: 2.5 mg via INTRAVENOUS

## 2019-04-08 MED ORDER — MIDAZOLAM HCL 2 MG/2ML IJ SOLN
INTRAMUSCULAR | Status: DC | PRN
  Administered 2019-04-08 (×2): 1 mg via INTRAVENOUS

## 2019-04-08 MED ORDER — ASPIRIN 81 MG PO CHEW
CHEWABLE_TABLET | ORAL | Status: AC
  Filled 2019-04-08: qty 1

## 2019-04-08 MED ORDER — HEPARIN SODIUM (PORCINE) 1000 UNIT/ML IJ SOLN
INTRAMUSCULAR | Status: AC
  Filled 2019-04-08: qty 1

## 2019-04-08 MED ORDER — FENTANYL CITRATE (PF) 100 MCG/2ML IJ SOLN
INTRAMUSCULAR | Status: DC | PRN
  Administered 2019-04-08: 50 ug via INTRAVENOUS
  Administered 2019-04-08: 25 ug via INTRAVENOUS

## 2019-04-08 MED ORDER — HEPARIN (PORCINE) IN NACL 1000-0.9 UT/500ML-% IV SOLN
INTRAVENOUS | Status: AC
  Filled 2019-04-08: qty 1000

## 2019-04-08 MED ORDER — SODIUM CHLORIDE 0.9 % WEIGHT BASED INFUSION: 1.0000 mL/kg/h | INTRAVENOUS | Status: DC

## 2019-04-08 MED ORDER — SODIUM CHLORIDE 0.9% FLUSH: 3.0000 mL | INTRAVENOUS | Status: DC | PRN

## 2019-04-08 MED ORDER — HEPARIN (PORCINE) IN NACL 1000-0.9 UT/500ML-% IV SOLN
INTRAVENOUS | Status: DC | PRN
  Administered 2019-04-08: 500 mL

## 2019-04-08 MED ORDER — MIDAZOLAM HCL 2 MG/2ML IJ SOLN
INTRAMUSCULAR | Status: AC
  Filled 2019-04-08: qty 2

## 2019-04-08 MED ORDER — ASPIRIN 81 MG PO CHEW
81.0000 mg | CHEWABLE_TABLET | ORAL | Status: AC
  Administered 2019-04-08: 10:00:00 81 mg via ORAL

## 2019-04-08 MED ORDER — SODIUM CHLORIDE 0.9 % WEIGHT BASED INFUSION: 3.0000 mL/kg/h | INTRAVENOUS | Status: DC

## 2019-04-08 MED ORDER — FENTANYL CITRATE (PF) 100 MCG/2ML IJ SOLN
INTRAMUSCULAR | Status: AC
  Filled 2019-04-08: qty 2

## 2019-04-08 MED ORDER — VERAPAMIL HCL 2.5 MG/ML IV SOLN
INTRAVENOUS | Status: AC
  Filled 2019-04-08: qty 2

## 2019-04-08 MED ORDER — SODIUM CHLORIDE 0.9% FLUSH: 3.0000 mL | Freq: Two times a day (BID) | INTRAVENOUS | Status: DC

## 2019-04-08 SURGICAL SUPPLY — 10 items
CATH BALLN WEDGE 5F 110CM (CATHETERS) ×2 IMPLANT
CATH INFINITI 5FR JK (CATHETERS) ×2 IMPLANT
DEVICE RAD TR BAND REGULAR (VASCULAR PRODUCTS) ×2 IMPLANT
GLIDESHEATH SLEND SS 6F .021 (SHEATH) ×2 IMPLANT
GUIDEWIRE .025 260CM (WIRE) ×2 IMPLANT
GUIDEWIRE INQWIRE 1.5J.035X260 (WIRE) ×1 IMPLANT
INQWIRE 1.5J .035X260CM (WIRE) ×2
KIT MANI 3VAL PERCEP (MISCELLANEOUS) ×2 IMPLANT
PACK CARDIAC CATH (CUSTOM PROCEDURE TRAY) ×2 IMPLANT
SHEATH GLIDE SLENDER 4/5FR (SHEATH) ×2 IMPLANT

## 2019-04-08 NOTE — Interval H&P Note (Signed)
Cath Lab Visit (complete for each Cath Lab visit)  Clinical Evaluation Leading to the Procedure:   ACS: No.  Non-ACS:    Anginal Classification: CCS III  Anti-ischemic medical therapy: Maximal Therapy (2 or more classes of medications)  Non-Invasive Test Results: No non-invasive testing performed  Prior CABG: No previous CABG      History and Physical Interval Note:  04/08/2019 12:08 PM  Nancy Barajas  has presented today for surgery, with the diagnosis of RT and LT Cath   Heart failure.  The various methods of treatment have been discussed with the patient and family. After consideration of risks, benefits and other options for treatment, the patient has consented to  Procedure(s): RIGHT/LEFT HEART CATH AND CORONARY ANGIOGRAPHY (Bilateral) as a surgical intervention.  The patient's history has been reviewed, patient examined, no change in status, stable for surgery.  I have reviewed the patient's chart and labs.  Questions were answered to the patient's satisfaction.     Nancy Barajas

## 2019-04-10 ENCOUNTER — Ambulatory Visit (INDEPENDENT_AMBULATORY_CARE_PROVIDER_SITE_OTHER): Payer: Medicare Other | Admitting: Psychology

## 2019-04-10 DIAGNOSIS — F418 Other specified anxiety disorders: Secondary | ICD-10-CM | POA: Diagnosis not present

## 2019-04-23 ENCOUNTER — Ambulatory Visit: Payer: Medicare Other | Admitting: Cardiology

## 2019-04-25 ENCOUNTER — Ambulatory Visit (INDEPENDENT_AMBULATORY_CARE_PROVIDER_SITE_OTHER): Payer: Medicare Other | Admitting: Pharmacist

## 2019-04-25 DIAGNOSIS — I10 Essential (primary) hypertension: Secondary | ICD-10-CM | POA: Diagnosis not present

## 2019-04-25 DIAGNOSIS — E1165 Type 2 diabetes mellitus with hyperglycemia: Secondary | ICD-10-CM | POA: Diagnosis not present

## 2019-04-25 DIAGNOSIS — I5020 Unspecified systolic (congestive) heart failure: Secondary | ICD-10-CM

## 2019-04-25 NOTE — Patient Instructions (Signed)
Visit Information  Goals Addressed            This Visit's Progress     Patient Stated   . "I want to control my diabetes" (pt-stated)       CARE PLAN ENTRY (see longtitudinal plan of care for additional care plan information)  Current Barriers:  . Diabetes: uncontrolled; most recent A1c 8.6% . Current antihyperglycemic regimen: Trulicity 3 mg weekly; Humalog sliding scale: 70-130 0 units, 131-180 4 units, 181-240 8 units, 241-300 10 units, 301-350 12 units, 351-400 16 units o Previously on metformin, stopped d/t kidney function decline o Previously on Jardiance, stopped d/t recurrent yeast infections on low dose  . Denies hypoglycemia since starting Humalog . Current glucose readings- prescribed FreeStyle Libre CGM, though is not covered at local pharmacy. Patient is checking sugars 4-5 times daily as below  Fasting Pre-B Breakfast Units Pre-Lunch Lunch units Afternoon  Pre Supper Supper units PM  8-Apr 131 151 4 161 did not eat 122 130    9-Apr 117 161 4 153 did not eat 145 211 8 90  10-Apr 94 112     143 4   11-Apr 105     110 126  123  12-Apr 116 139 4 133  123 180  123  13-Apr 154 142 4    150 4   14-Apr 119 248 10   180     15-Apr 105 125  125   173    16-Apr 129 199 8 147 4 117 145 4   17-Apr 177 144 4 176  113 117    18-Apr 125   206 8  139    19-Apr 122 148 4 197 8 119 105    20-Apr 143  4 144  127 179 4   21-Apr 164 231 8 118  145 138 4   22-Apr 109 128          . Cardiovascular risk reduction (recent dx HFrEF, EF 25-30%) o Current hypertensive regimen: carvedilol CR 40 mg daily - though has not started this medication yet, was unaware of this change; amlodipine 10 mg QAM, hydralazine 100 mg BID, Entresto 49/51 BID - patient notes she stopped this medication because she thought it was effecting her breathing (does not describe throat constriction, describes SOB) current BP readings  SBP DBP HR SBP DBP HR  8-Apr 128 92 94 122 87 91  9-Apr        10-Apr 128 86 85  132 94 85  11-Apr    163 100 93  12-Apr        13-Apr 115 73 81 118 70 87  14-Apr 136 88 88 131 77 84  15-Apr 141 93 87 141 86 84  16-Apr        17-Apr        18-Apr 141 99 79     19-Apr 129 87 86     20-Apr 128 76 93     21-Apr 124 82 75     22-Apr 137 99 87      o Current hyperlipidemia regimen: rosuvastatin 10 mg; LDL at goal <70 o  Pharmacist Clinical Goal(s):  Marland Kitchen Over the next 90 days, patient with work with PharmD and primary care provider to address optimized glycemic management  Interventions: . Comprehensive medication review performed, medication list updated in electronic medical record . Inter-disciplinary care team collaboration (see longitudinal plan of care) . Discussed switch from carvedilol 25 mg BID to carvedilol  CR 40 mg daily. Patient will call [facility] today to fill that prescription, and will start tomorrow . Discussed that she should not self-discontinued Entresto. Discussed importance for HFrEF. Instructed to restart, and discuss w/ cardiology provider at appt on Monday. Encouraged to call her providers to discuss symptoms prior to self-discontinuing or self-adjusting medications . Patient is injecting 3+ times daily and checking sugar 4+ times daily as above. Will collaborate w/ PCP and CMA to complete [facility] order for FreeStyle [place] and fax in with clinical notes.  . Patient has multiple complex medical conditions including DM, HTN, HFrEF, CKD. Would benefit from more extensive dietary and lifestyle education. Patient amenable to referral to [facility] RN CM for additional support.   Patient Self Care Activities:  . Patient will check blood glucose BID, document, and provide at future appointments . Patient will take medications as prescribed . Patient will report any questions or concerns to provider   Please see past updates related to this goal by clicking on the "Past Updates" button in the selected goal         Patient verbalizes  understanding of instructions provided today.   Plan:  - Scheduled f/u call 06/13/19  [name], PharmD, BCACP, CPP Clinical Pharmacist [place] [phone]

## 2019-04-25 NOTE — Chronic Care Management (AMB) (Signed)
Chronic Care Management   Follow Up Note   04/25/2019 Name: Nancy Barajas MRN: [id number] DOB: 1960-04-27  Referred by: McLean-Scocuzza, Nino Glow, MD Reason for referral : Chronic Care Management (Medication Management)   Nancy Barajas is a 59 y.o. year old female who is a primary care patient of McLean-Scocuzza, Nino Glow, MD. The CCM team was consulted for assistance with chronic disease management and care coordination needs.    Contacted patient for medication management review.  Review of patient status, including review of consultants reports, relevant laboratory and other test results, and collaboration with appropriate care team members and the patient's provider was performed as part of comprehensive patient evaluation and provision of chronic care management services.    SDOH (Social Determinants of Health) assessments performed: No See Care Plan activities for detailed interventions related to [facility])     Outpatient Encounter Medications as of 04/25/2019  Medication Sig Note  . amLODipine (NORVASC) 10 MG tablet Take 1 tablet (10 mg total) by mouth daily.   . blood glucose meter kit and supplies Dispense based on patient and insurance preference. Use up to four times daily as directed. (FOR ICD-10 E10.9, E11.9).   Marland Kitchen Cholecalciferol (VITAMIN D-3) 125 MCG (5000 UT) TABS Take 1 tablet by mouth daily. (Patient taking differently: Take 5,000 Units by mouth daily. )   . Dulaglutide (TRULICITY) 3 VZ/8.5YI SOPN Inject 3 mg into the skin once a week.   Marland Kitchen glucose blood test strip relion strips Use as instructed check sugar fasting in am and 15-20 minutes before lunch and dinner then at night   . hydrALAZINE (APRESOLINE) 100 MG tablet Take 1 tablet (100 mg total) by mouth 2 (two) times daily.   . insulin lispro (HUMALOG KWIKPEN) 100 UNIT/ML KwikPen Check sugar 15-20 minutes before meals and give insulin based on sugar reading 70-130 0 units, 131-180 4 units, 181-240 8 units, 241-300 10  units, 301-350 12 units, 351-400 16 units, >400 20 units and call the doctor (Patient taking differently: Inject 4-16 Units into the skin See admin instructions. Check sugar 15-20 minutes before meals and give insulin based on sugar reading 70-130 0 units, 131-180 4 units, 181-240 8 units, 241-300 10 units, 301-350 12 units, 351-400 16 units, >400 20 units and call the doctor)   . Insulin Pen Needle (PEN NEEDLES) 31G X 8 MM MISC 1 Device by Does not apply route in the morning, at noon, and at bedtime. Use for humalog   . Multiple Vitamins-Minerals (CENTRUM SILVER 50+WOMEN PO) Take 1 tablet by mouth daily.   . ReliOn Ultra Thin Lancets MISC 1 Device by Does not apply route 4 (four) times daily -  before meals and at bedtime. relion brand   . rosuvastatin (CRESTOR) 10 MG tablet Take 10 mg by mouth every evening.    . sertraline (ZOLOFT) 100 MG tablet Take 0.5 tablets (50 mg total) by mouth daily for 14 days, THEN 1 tablet (100 mg total) daily.   . carvedilol (COREG CR) 40 MG 24 hr capsule Take 1 capsule (40 mg total) by mouth daily. (Patient not taking: Reported on 04/25/2019) 04/25/2019: Still taking 25 mg BID  . Continuous Blood Gluc Receiver (FREESTYLE LIBRE 14 DAY READER) DEVI 1 Device by Does not apply route every 14 (fourteen) days.   . Continuous Blood Gluc Sensor (FREESTYLE LIBRE 14 DAY SENSOR) MISC 1 Device by Does not apply route every 14 (fourteen) days.   . sacubitril-valsartan (ENTRESTO) 49-51 MG Take 1 tablet by mouth 2 (  two) times daily. (Patient not taking: Reported on 04/25/2019)   . sodium chloride (OCEAN) 0.65 % SOLN nasal spray Place 1 spray into both nostrils as needed for congestion.   . traZODone (DESYREL) 100 MG tablet Take 1 tablet (100 mg total) by mouth at bedtime as needed for sleep. (Patient not taking: Reported on 04/25/2019)    No facility-administered encounter medications on file as of 04/25/2019.     Objective:   Goals Addressed            This Visit's Progress      Patient Stated   . "I want to control my diabetes" (pt-stated)       CARE PLAN ENTRY (see longtitudinal plan of care for additional care plan information)  Current Barriers:  . Diabetes: uncontrolled; most recent A1c 8.6% . Current antihyperglycemic regimen: Trulicity 3 mg weekly; Humalog sliding scale: 70-130 0 units, 131-180 4 units, 181-240 8 units, 241-300 10 units, 301-350 12 units, 351-400 16 units o Previously on metformin, stopped d/t kidney function decline o Previously on Jardiance, stopped d/t recurrent yeast infections on low dose  . Denies hypoglycemia since starting Humalog . Current glucose readings- prescribed FreeStyle Libre CGM, though is not covered at local pharmacy. Patient is checking sugars 4-5 times daily as below  Fasting Pre-B Breakfast Units Pre-Lunch Lunch units Afternoon  Pre Supper Supper units PM  8-Apr 131 151 4 161 did not eat 122 130    9-Apr 117 161 4 153 did not eat 145 211 8 90  10-Apr 94 112     143 4   11-Apr 105     110 126  123  12-Apr 116 139 4 133  123 180  123  13-Apr 154 142 4    150 4   14-Apr 119 248 10   180     15-Apr 105 125  125   173    16-Apr 129 199 8 147 4 117 145 4   17-Apr 177 144 4 176  113 117    18-Apr 125   206 8  139    19-Apr 122 148 4 197 8 119 105    20-Apr 143  4 144  127 179 4   21-Apr 164 231 8 118  145 138 4   22-Apr 109 128          . Cardiovascular risk reduction (recent dx HFrEF, EF 25-30%) o Current hypertensive regimen: carvedilol CR 40 mg daily - though has not started this medication yet, was unaware of this change; amlodipine 10 mg QAM, hydralazine 100 mg BID, Entresto 49/51 BID - patient notes she stopped this medication because she thought it was effecting her breathing (does not describe throat constriction, describes SOB) current BP readings  SBP DBP HR SBP DBP HR  8-Apr 128 92 94 122 87 91  9-Apr        10-Apr 128 86 85 132 94 85  11-Apr    163 100 93  12-Apr        13-Apr 115 73 81 118 70 87    14-Apr 136 88 88 131 77 84  15-Apr 141 93 87 141 86 84  16-Apr        17-Apr        18-Apr 141 99 79     19-Apr 129 87 86     20-Apr 128 76 93     21-Apr 124 82 75     22-Apr 137 99 87  o Current hyperlipidemia regimen: rosuvastatin 10 mg; LDL at goal <70 o  Pharmacist Clinical Goal(s):  Marland Kitchen Over the next 90 days, patient with work with PharmD and primary care provider to address optimized glycemic management  Interventions: . Comprehensive medication review performed, medication list updated in electronic medical record . Inter-disciplinary care team collaboration (see longitudinal plan of care) . Discussed switch from carvedilol 25 mg BID to carvedilol CR 40 mg daily. Patient will call [facility] today to fill that prescription, and will start tomorrow . Discussed that she should not self-discontinued Entresto. Discussed importance for HFrEF. Instructed to restart, and discuss w/ cardiology provider at appt on Monday. Encouraged to call her providers to discuss symptoms prior to self-discontinuing or self-adjusting medications . Patient is injecting 3+ times daily and checking sugar 4+ times daily as above. Will collaborate w/ PCP and CMA to complete [facility] order for FreeStyle [place] and fax in with clinical notes.  . Patient has multiple complex medical conditions including DM, HTN, HFrEF, CKD. Would benefit from more extensive dietary and lifestyle education. Patient amenable to referral to [facility] RN CM for additional support.   Patient Self Care Activities:  . Patient will check blood glucose BID, document, and provide at future appointments . Patient will take medications as prescribed . Patient will report any questions or concerns to provider   Please see past updates related to this goal by clicking on the "Past Updates" button in the selected goal          Plan:  - Scheduled f/u call 06/13/19  [name], PharmD, BCACP, [place] Pharmacist [place] [phone]

## 2019-04-29 ENCOUNTER — Telehealth: Payer: Self-pay | Admitting: Internal Medicine

## 2019-04-29 ENCOUNTER — Encounter: Payer: Self-pay | Admitting: Cardiology

## 2019-04-29 ENCOUNTER — Telehealth: Payer: Medicare Other

## 2019-04-29 ENCOUNTER — Ambulatory Visit: Payer: Medicare Other | Admitting: Psychology

## 2019-04-29 ENCOUNTER — Other Ambulatory Visit: Payer: Self-pay

## 2019-04-29 ENCOUNTER — Ambulatory Visit (INDEPENDENT_AMBULATORY_CARE_PROVIDER_SITE_OTHER): Payer: Medicare Other | Admitting: Cardiology

## 2019-04-29 VITALS — BP 106/60 | HR 91 | Ht 71.0 in | Wt 230.0 lb

## 2019-04-29 DIAGNOSIS — I10 Essential (primary) hypertension: Secondary | ICD-10-CM | POA: Diagnosis not present

## 2019-04-29 DIAGNOSIS — I428 Other cardiomyopathies: Secondary | ICD-10-CM

## 2019-04-29 MED ORDER — ENTRESTO 97-103 MG PO TABS: 1.0000 | ORAL_TABLET | Freq: Two times a day (BID) | ORAL | 5 refills | Status: DC

## 2019-04-29 MED ORDER — CARVEDILOL 25 MG PO TABS: 25.0000 mg | ORAL_TABLET | Freq: Two times a day (BID) | ORAL | 5 refills | Status: DC

## 2019-04-29 MED ORDER — SPIRONOLACTONE 25 MG PO TABS: 25.0000 mg | ORAL_TABLET | Freq: Every day | ORAL | 5 refills | Status: DC

## 2019-04-29 NOTE — Telephone Encounter (Signed)
faxed Physician orders CGM and Supplies to [facility] [phone]

## 2019-04-29 NOTE — Patient Instructions (Signed)
Medication Instructions:  Your physician has recommended you make the following change in your medication:   1) STOP Coreg CR 2) STOP Amlodipine 3) STOP Hydralazine  4) START Carvedilol 25 mg twice daily. An Rx has been sent to your pharmacy. 5) START Spironolactone 25 mg daily. An Rx has been sent to your pharmacy. 6) INCREASE Entresto to 97/103 mg twice daily. An Rx has been sent to your pharmacy.  *If you need a refill on your cardiac medications before your next appointment, please call your pharmacy*   Lab Work: Your physician recommends that you return for lab work in: 2 weeks (bmet)  Please have your lab drawn at the [facility]. You do not need an appointment. Their hours ate Mon-Fri 7:30 am-6pm.  If you have labs (blood work) drawn today and your tests are completely normal, you will receive your results only by: Marland Kitchen MyChart Message (if you have MyChart) OR . A paper copy in the mail If you have any lab test that is abnormal or we need to change your treatment, we will call you to review the results.   Testing/Procedures: None ordered   Follow-Up: At [facility], you and your health needs are our priority.  As part of our continuing mission to provide you with exceptional heart care, we have created designated Provider Care Teams.  These Care Teams include your primary Cardiologist (physician) and Advanced Practice Providers (APPs -  Physician Assistants and Nurse Practitioners) who all work together to provide you with the care you need, when you need it.  We recommend signing up for the patient portal called "MyChart".  Sign up information is provided on this After Visit Summary.  MyChart is used to connect with patients for Virtual Visits (Telemedicine).  Patients are able to view lab/test results, encounter notes, upcoming appointments, etc.  Non-urgent messages can be sent to your provider as well.   To learn more about what you can do with MyChart, go to  [email or web address].    Your next appointment:   4 week(s)  The format for your next appointment:   In Person  Provider:   Kate Sable, MD   Other Instructions N/A

## 2019-04-29 NOTE — Progress Notes (Signed)
Cardiology Office Note:    Date:  04/29/2019   ID:  Nancy Barajas, DOB [birth year], MRN [id number]  PCP:  McLean-Scocuzza, Nino Glow, MD  Cardiologist:  Kate Sable, MD  Electrophysiologist:  None   Referring MD: McLean-Scocuzza, Olivia Mackie *   Chief Complaint  Patient presents with  . Other    Follow up post Cath. meds reviewed verbally with patient.     History of Present Illness:    Nancy Barajas is a 59 y.o. female with a hx of hypertension, HFrEF EF 25-30%, diabetes, hyperlipidemia, CKD stage III, who presents for follow-up.  Has a history of fatigue with walking, long history of hypertension.  Echocardiogram showed severely reduced ejection fraction with EF 25 to 30%.  She had left heart cath performed on 04/08/2019 showed normal coronary arteries.  She states doing okay, denies edema.  Already on Entresto 49/51 twice daily.  Stop Entresto as she thought it was contributing to her vocal cord dysfunction.   Past Medical History:  Diagnosis Date  . Asthma   . Diabetes mellitus without complication ([place])   . Hypertension   . Kidney stones   . Vertigo    when laying flat  . Vocal cord dysfunction    makes it hard for patient to talk and breathe    Past Surgical History:  Procedure Laterality Date  . LITHOTRIPSY    . RIGHT/LEFT HEART CATH AND CORONARY ANGIOGRAPHY Bilateral 04/08/2019   Procedure: RIGHT/LEFT HEART CATH AND CORONARY ANGIOGRAPHY;  Surgeon: Wellington Hampshire, MD;  Location: [facility];  Service: Cardiovascular;  Laterality: Bilateral;    Current Medications: Current Meds  Medication Sig  . blood glucose meter kit and supplies Dispense based on patient and insurance preference. Use up to four times daily as directed. (FOR ICD-10 E10.9, E11.9).  Marland Kitchen Cholecalciferol (VITAMIN D-3) 125 MCG (5000 UT) TABS Take 1 tablet by mouth daily. (Patient taking differently: Take 5,000 Units by mouth daily. )  . Continuous Blood Gluc Receiver (FREESTYLE LIBRE 14 DAY  READER) DEVI 1 Device by Does not apply route every 14 (fourteen) days.  . Continuous Blood Gluc Sensor (FREESTYLE LIBRE 14 DAY SENSOR) MISC 1 Device by Does not apply route every 14 (fourteen) days.  . Dulaglutide (TRULICITY) 3 ZJ/6.7HA SOPN Inject 3 mg into the skin once a week.  Marland Kitchen glucose blood test strip relion strips Use as instructed check sugar fasting in am and 15-20 minutes before lunch and dinner then at night  . insulin lispro (HUMALOG KWIKPEN) 100 UNIT/ML KwikPen Check sugar 15-20 minutes before meals and give insulin based on sugar reading 70-130 0 units, 131-180 4 units, 181-240 8 units, 241-300 10 units, 301-350 12 units, 351-400 16 units, >400 20 units and call the doctor (Patient taking differently: Inject 4-16 Units into the skin See admin instructions. Check sugar 15-20 minutes before meals and give insulin based on sugar reading 70-130 0 units, 131-180 4 units, 181-240 8 units, 241-300 10 units, 301-350 12 units, 351-400 16 units, >400 20 units and call the doctor)  . Insulin Pen Needle (PEN NEEDLES) 31G X 8 MM MISC 1 Device by Does not apply route in the morning, at noon, and at bedtime. Use for humalog  . Multiple Vitamins-Minerals (CENTRUM SILVER 50+WOMEN PO) Take 1 tablet by mouth daily.  . ReliOn Ultra Thin Lancets MISC 1 Device by Does not apply route 4 (four) times daily -  before meals and at bedtime. relion brand  . rosuvastatin (CRESTOR) 10 MG tablet  Take 10 mg by mouth every evening.   . sertraline (ZOLOFT) 100 MG tablet Take 0.5 tablets (50 mg total) by mouth daily for 14 days, THEN 1 tablet (100 mg total) daily.  . sodium chloride (OCEAN) 0.65 % SOLN nasal spray Place 1 spray into both nostrils as needed for congestion.  . traZODone (DESYREL) 100 MG tablet Take 1 tablet (100 mg total) by mouth at bedtime as needed for sleep.  . [DISCONTINUED] amLODipine (NORVASC) 10 MG tablet Take 1 tablet (10 mg total) by mouth daily.  . [DISCONTINUED] hydrALAZINE (APRESOLINE) 100 MG  tablet Take 1 tablet (100 mg total) by mouth 2 (two) times daily.  . [DISCONTINUED] sacubitril-valsartan (ENTRESTO) 49-51 MG Take 1 tablet by mouth 2 (two) times daily.     Allergies:   Penicillins   Social History   Socioeconomic History  . Marital status: Divorced    Spouse name: Not on file  . Number of children: 2  . Years of education: Not on file  . Highest education level: 9th grade  Occupational History  . Not on file  Tobacco Use  . Smoking status: Former Smoker    Packs/day: 1.00    Years: 10.00    Pack years: 10.00    Quit date: 01/03/1998    Years since quitting: 21.3  . Smokeless tobacco: Never Used  Substance and Sexual Activity  . Alcohol use: Never  . Drug use: Never  . Sexual activity: Not Currently  Other Topics Concern  . Not on file  Social History Narrative   Lives at home    2 daughters as of 04/04/19 51 y.o daughter lives with her (she works)   Patient is on disability.   Social Determinants of Health   Financial Resource Strain: High Risk  . Difficulty of Paying Living Expenses: Hard  Food Insecurity: No Food Insecurity  . Worried About Charity fundraiser in the Last Year: Never true  . Ran Out of Food in the Last Year: Never true  Transportation Needs: No Transportation Needs  . Lack of Transportation (Medical): No  . Lack of Transportation (Non-Medical): No  Physical Activity: Unknown  . Days of Exercise per Week: 0 days  . Minutes of Exercise per Session: Not on file  Stress: No Stress Concern Present  . Feeling of Stress : Only a little  Social Connections: Unknown  . Frequency of Communication with Friends and Family: Three times a week  . Frequency of Social Gatherings with Friends and Family: More than three times a week  . Attends Religious Services: Never  . Active Member of Clubs or Organizations: Yes  . Attends Archivist Meetings: Never  . Marital Status: Not on file     Family History: The patient's family history  includes Diabetes in her father and mother; Hypertension in her father and mother.  ROS:   Please see the history of present illness.     All other systems reviewed and are negative.  EKGs/Labs/Other Studies Reviewed:    The following studies were reviewed today:   EKG:  EKG is  ordered today.  The ekg ordered today demonstrates normal sinus rhythm, left bundle branch block  Recent Labs: 11/07/2018: TSH 0.93 02/20/2019: ALT 11 04/02/2019: BUN 26; Creatinine, Ser 1.24; Hemoglobin 11.9; Platelets 376; Potassium 4.5; Sodium 138  Recent Lipid Panel    Component Value Date/Time   CHOL 160 02/20/2019 0851   TRIG 63.0 02/20/2019 0851   HDL 79.70 02/20/2019 0851  CHOLHDL 2 02/20/2019 0851   VLDL 12.6 02/20/2019 0851   LDLCALC 67 02/20/2019 0851    Physical Exam:    VS:  BP 106/60 (BP Location: Right Arm, Patient Position: Sitting, Cuff Size: Normal)   Pulse 91   Ht 5' 11" (1.803 m)   Wt 230 lb (104.3 kg)   LMP 01/03/2013   SpO2 98%   BMI 32.08 kg/m     Wt Readings from Last 3 Encounters:  04/29/19 230 lb (104.3 kg)  04/08/19 244 lb (110.7 kg)  04/04/19 242 lb 12.8 oz (110.1 kg)     GEN:  Well nourished, well developed in no acute distress, obese HEENT: Normal NECK: No JVD; No carotid bruits LYMPHATICS: No lymphadenopathy CARDIAC: RRR, no murmurs, rubs, gallops RESPIRATORY:  Clear to auscultation without rales, wheezing or rhonchi  ABDOMEN: Soft, non-tender, non-distended MUSCULOSKELETAL:  No edema; No deformity  SKIN: Warm and dry NEUROLOGIC:  Alert and oriented x 3 PSYCHIATRIC:  Normal affect   ASSESSMENT:    1. NICM (nonischemic cardiomyopathy) ([place])   2. Essential hypertension    PLAN:    In order of problems listed above:  1. Echocardiogram obtained due to LVH on EKG.  Echo showed severely reduced ejection fraction with EF 25 to 30%.  Mild LVH.  Patient appears euvolemic.  She describes NYHA class II symptoms.  Left heart cath shows normal coronaries.   Cardiomyopathy likely due to long history of uncontrolled hypertension.  Increase Entresto to 97/103 mg twice daily.  Start Coreg 25 mg twice daily twice daily.  Start Aldactone 25 mg daily.  Stop amlodipine, stop hydralazine.  Get BMP in 2 weeks 2. History of hypertension, blood pressure is now controlled, low normal.  Stop amlodipine, stop Coreg CR.  Start Coreg 25 mg twice daily, increase Entresto as above, start Aldactone.  Follow-up in 1 month  Total encounter time 45 minutes  Greater than 50% was spent in counseling and coordination of care with the patient Time spent educating patient on etiology of heart failure, medication management for heart failure, reasoning for medication titration.  All questions were answered.  This note was generated in part or whole with voice recognition software. Voice recognition is usually quite accurate but there are transcription errors that can and very often do occur. I apologize for any typographical errors that were not detected and corrected.  Medication Adjustments/Labs and Tests Ordered: Current medicines are reviewed at length with the patient today.  Concerns regarding medicines are outlined above.  Orders Placed This Encounter  Procedures  . Basic metabolic panel  . EKG 12-Lead   Meds ordered this encounter  Medications  . sacubitril-valsartan (ENTRESTO) 97-103 MG    Sig: Take 1 tablet by mouth 2 (two) times daily.    Dispense:  60 tablet    Refill:  5    Dosage increase  . carvedilol (COREG) 25 MG tablet    Sig: Take 1 tablet (25 mg total) by mouth 2 (two) times daily.    Dispense:  60 tablet    Refill:  5    STOP COREG CR  . spironolactone (ALDACTONE) 25 MG tablet    Sig: Take 1 tablet (25 mg total) by mouth daily.    Dispense:  30 tablet    Refill:  5    Patient Instructions  Medication Instructions:  Your physician has recommended you make the following change in your medication:   1) STOP Coreg CR 2) STOP Amlodipine 3)  STOP Hydralazine  4) START Carvedilol 25 mg twice daily. An Rx has been sent to your pharmacy. 5) START Spironolactone 25 mg daily. An Rx has been sent to your pharmacy. 6) INCREASE Entresto to 97/103 mg twice daily. An Rx has been sent to your pharmacy.  *If you need a refill on your cardiac medications before your next appointment, please call your pharmacy*   Lab Work: Your physician recommends that you return for lab work in: 2 weeks (bmet)  Please have your lab drawn at the [facility]. You do not need an appointment. Their hours ate Mon-Fri 7:30 am-6pm.  If you have labs (blood work) drawn today and your tests are completely normal, you will receive your results only by: Marland Kitchen MyChart Message (if you have MyChart) OR . A paper copy in the mail If you have any lab test that is abnormal or we need to change your treatment, we will call you to review the results.   Testing/Procedures: None ordered   Follow-Up: At [facility], you and your health needs are our priority.  As part of our continuing mission to provide you with exceptional heart care, we have created designated Provider Care Teams.  These Care Teams include your primary Cardiologist (physician) and Advanced Practice Providers (APPs -  Physician Assistants and Nurse Practitioners) who all work together to provide you with the care you need, when you need it.  We recommend signing up for the patient portal called "MyChart".  Sign up information is provided on this After Visit Summary.  MyChart is used to connect with patients for Virtual Visits (Telemedicine).  Patients are able to view lab/test results, encounter notes, upcoming appointments, etc.  Non-urgent messages can be sent to your provider as well.   To learn more about what you can do with MyChart, go to [email or web address].    Your next appointment:   4 week(s)  The format for your next appointment:   In Person  Provider:   Kate Sable,  MD   Other Instructions N/A     Signed, Kate Sable, MD  04/29/2019 12:54 PM    [place]

## 2019-05-01 ENCOUNTER — Encounter: Payer: Self-pay | Admitting: *Deleted

## 2019-05-01 ENCOUNTER — Other Ambulatory Visit: Payer: Self-pay | Admitting: *Deleted

## 2019-05-01 NOTE — Patient Outreach (Signed)
[facility]) Care Management [place] Telephone Outreach, new referral- routine/ PCP office  05/01/2019  Nancy Barajas [birth year] [id number]  Successful telephone outreach to Nancy Barajas, 59 y/o female referred to [facility] RN CM on April 25, 2019 by [facility] CMA for routine PCP referral for DM management and new diagnosis of CHF.  Patient has had no recent unplanned hospitalizations.  Patient has history including, but not limited to, HTN/ HLD; DM- typee II with most recent A1-C of 8.6 (8.6- Feb 2021); CKD- III; and anxiety/ depression.  HIPAA/ identity verified and [facility] RN CM services discussed with patient and patient provides verbal consent for [facility] CM involvement in her care.  Today, patient reports "doing pretty good," and she denies pain/ discomfort/ clinical concerns, and sounds to be in no distress throughout phone call today.  Patient further reports:  Medications: -- Has all medicationsand takes as prescribed;denies questions/ concerns around current medications, confirms that she is actively working with [place], embedded at PCP office- encouraged her ongoing engagement with [place] team.  -- Nancy Barajas good general understanding of the purpose, dosing, and scheduling of medications; declines medication review today, stating she has already put her medications up; however, patient is able to independently verbalize without prompting recent changes to medications as a result off cardiology office visit April 29, 2019-- she confirms that she has obtained and is taking all medications accordingly.   -- self-manage medications using weekly pill planner box. -- denies issues with swallowing medications -- agrees to general medication review at time of next scheduled outreach  Provider appointments: -- All recent and upcoming provider appointments were reviewed with patient today; patient verbalizes accurate understanding of all post-office  visit instructions and plans to attend as scheduled upcoming provider office visits  Safety/ Mobility/ Falls: -- denies new/ recent falls-- states has had no falls over course of last year -- assistive devices: none, states "not needed;" "steady on feet" -- general fall risks/ prevention education discussed with patient today  Holiday representative needs: -- currently denies community resource needs, stating supportive adult daughter that she lives with, who is able to assist with care needs as indicated- however, patient is essentially independent in all self care activities; reports daughter works full time, night-shift hours -- patient drives self to all provider appointments, errands, etc -- SDOH completed for: depression/ transportation/ food insecurity; no concerns identified; patient reports she believes her depression is "slowly getting better" with recent psychiatry referral at time of last PCP office visit ; encouraged patient to maintain contact and full engagement with psychiatrist, and she verbalizes agreement with same.  Advanced Directive (AD) Planning:   --reports does not currently have exisisting AD in place, however, confirms that she has planning packet at her home, and "is starting to" work on completing these documents; basics of Advanced Directive planning were discussed with patient, who endorses herself as a full code status  Self-health management of chronic disease states of DM and CHF: -- monitors/ record blood sugars at home 2-4 times per day; reports no low readings; but fluctuations throughout day; reports "almost always between 130-200: encouraged to continue this important practice; discussed benefit of food intake journaling for self-empowerment of managing blood sugars; patient agrees to begin completing food intake recording  -- does not understand significance/ value of A1-C test: using teach back method, education was provided and discussed with patient; we  reviewed her recent A1-C trends, and I explained how these correlate to blood  sugars at home -- does not currently monitor/ record daily weights at home: discussed purpose/ value and weight gain guidelines in setting of CHF, and patient is agreeable to start this practice -- denies clinical concerns around breathing today; states "always" short of breath as baseline, "today not so bad;" denies peripheral edema/ swelling; using teach back method, discussed with patient signs/ symptoms yellow CHF zone along with corresponding action plan for development of these symptoms  Patient denies further issues, concerns, or problems today.  I provided/ confirmed that patient has my direct phone number, the main [facility] CM office phone number, and the [facility] CM 24-hour nurse advice phone number should issues arise prior to next scheduled [place] outreach.  Encouraged patient to contact me directly if needs, questions, issues, or concerns arise prior to next scheduled outreach; patient agreed to do so.  Plan:  Patient will take medications as prescribed and will attend all scheduled provider appointments  Patient will promptly notify care providers for any new concerns/ issues/ problems that arise  Patient will continue monitoring and recording blood sugars at home 2-4 times per day and will begin journaling food intake  Patient will begin monitoring/ recording daily weights   I will make patient's PCP aware of [place] RN CM involvement in patient's care-- will send barriers letter  [place] outreach to continue with scheduled phone call next month  [facility] CM Care Plan Problem One     Most Recent Value  Care Plan Problem One  Self- health management of chronic disease states of DM and CHF, as evidenced by patient reporting  Role Documenting the Problem One  Care Management Coordinator  Care Plan for Problem One  Active  [facility] Long Term Goal   Over the next 60 days, patient will continue  monitoring and recording blood sugars 2-4 times per day, as evidenced by patient reporting/ review of same during [facility] RN CM outreach  [facility] Long Term Goal Start Date  05/01/19  Interventions for Problem One Long Term Goal  Initiated [facility] CM program and initial assessment,  discussed with patient her current understanding of DM and her current routines around monitoring blood sugars at home,  provided positive reinforcement that she is currently monitoring/ recording blood sugars and encouraged her to consider adding food journaling as a way to empower her management of DM at home,  using teach back method, provided education/ discussed with patient significance of A1-C levels and reviewed with her her recent A1-C trends,  completed nutritional assessment and placed printed educational material in mail to patient around A1-C levels and dietary management of DM  [name] Short Term Goal #1   Over the next 30 days, patient will begin daily monitoring and recording of weights at home, as evidenced by patient reporting/ review of same during [facility] RN CM outreach  [facility] CM Short Term Goal #1 Start Date  05/01/19  Interventions for Short Term Goal #1  Discussed with patient her current understanding of CHF,  using teach back method, provided education around purpose/ value of daily weight monitoring at home,  initiated education around signs/ symptoms yellow CHF zone and provided education around weight gain guidelines in setting of CHF,  discussed with patient her current medications for CHF and confirmed that is taking all as prescribed,  reviewed recent medication changes made as result of cardiology office visit 04/29/19 and confirmed that patient is able to accurately verbalize all changes.  Placed printed educational material in mail  to patient around self-health management of HF  [name] Short Term Goal #2   Over the next 30 days, patient will attend all scheduled provider appointments, as evidenced by patient reporting  and collaboration with care providers as indicated during [facility] RN CM review  [name] Short Term Goal #2 Start Date  05/01/19  Interventions for Short Term Goal #2  Confirmed that patient continues to drive self to provider appointments and has no transportation concerns,  reviewed with patient all upcoming scheduled provider office visits and confirmed that she is aware of all and has plans to attend all as scheduled     I appreciate the opportunity to participate in [place]'s care,  Oneta Rack, RN, BSN, [employer] Coordinator [facility] Care Management  [phone]

## 2019-05-02 ENCOUNTER — Ambulatory Visit (INDEPENDENT_AMBULATORY_CARE_PROVIDER_SITE_OTHER): Payer: Medicare Other | Admitting: Psychology

## 2019-05-02 ENCOUNTER — Telehealth: Payer: Self-pay | Admitting: Internal Medicine

## 2019-05-02 DIAGNOSIS — F418 Other specified anxiety disorders: Secondary | ICD-10-CM | POA: Diagnosis not present

## 2019-05-02 NOTE — Telephone Encounter (Signed)
[facility] Carvedilol Phosphate ER 40mg  Fax # [phone]

## 2019-05-02 NOTE — Progress Notes (Signed)
Insurance approved coreg CR 40 mg daily  Is she agreeable to change?        For Dr. Gayland Curry not pt Per cardiology below   I understand patient has history of hypertension and yes I'm agreeable with the change.   The patient has nonischemic cardiomyopathy with EF 25 to 30%.   Currently uptitrating guideline directed medical therapy for heart failure.  Just went up on her Entresto, and started spironolactone.  We will plan to add other guideline directed medical therapy as her blood pressure permits/if needed for optimal BP control, including hydralazine and nitrates.  Labetalol is not a guideline directed medical therapy for heart failure.   Thanks  BA

## 2019-05-10 DIAGNOSIS — N1831 Chronic kidney disease, stage 3a: Secondary | ICD-10-CM | POA: Diagnosis not present

## 2019-05-10 DIAGNOSIS — E1129 Type 2 diabetes mellitus with other diabetic kidney complication: Secondary | ICD-10-CM | POA: Diagnosis not present

## 2019-05-10 DIAGNOSIS — R809 Proteinuria, unspecified: Secondary | ICD-10-CM | POA: Diagnosis not present

## 2019-05-10 DIAGNOSIS — I129 Hypertensive chronic kidney disease with stage 1 through stage 4 chronic kidney disease, or unspecified chronic kidney disease: Secondary | ICD-10-CM | POA: Diagnosis not present

## 2019-05-10 DIAGNOSIS — N2581 Secondary hyperparathyroidism of renal origin: Secondary | ICD-10-CM | POA: Diagnosis not present

## 2019-05-13 ENCOUNTER — Ambulatory Visit (INDEPENDENT_AMBULATORY_CARE_PROVIDER_SITE_OTHER): Payer: Medicare Other | Admitting: Psychology

## 2019-05-13 DIAGNOSIS — F418 Other specified anxiety disorders: Secondary | ICD-10-CM | POA: Diagnosis not present

## 2019-05-14 ENCOUNTER — Telehealth (INDEPENDENT_AMBULATORY_CARE_PROVIDER_SITE_OTHER): Payer: Medicare Other | Admitting: Psychiatry

## 2019-05-14 ENCOUNTER — Other Ambulatory Visit: Payer: Self-pay

## 2019-05-14 ENCOUNTER — Other Ambulatory Visit
Admission: RE | Admit: 2019-05-14 | Discharge: 2019-05-14 | Disposition: A | Discharge status: Home / Self Care | Payer: Medicare Other | Attending: Cardiology | Admitting: Cardiology

## 2019-05-14 DIAGNOSIS — F329 Major depressive disorder, single episode, unspecified: Secondary | ICD-10-CM | POA: Diagnosis not present

## 2019-05-14 DIAGNOSIS — F411 Generalized anxiety disorder: Secondary | ICD-10-CM | POA: Diagnosis not present

## 2019-05-14 DIAGNOSIS — I10 Essential (primary) hypertension: Secondary | ICD-10-CM | POA: Diagnosis not present

## 2019-05-14 DIAGNOSIS — F32A Depression, unspecified: Secondary | ICD-10-CM

## 2019-05-14 DIAGNOSIS — I428 Other cardiomyopathies: Secondary | ICD-10-CM

## 2019-05-14 LAB — BASIC METABOLIC PANEL WITH GFR
Anion gap: 10 (ref 5–15)
BUN: 27 mg/dL — ABNORMAL HIGH (ref 6–20)
CO2: 26 mmol/L (ref 22–32)
Calcium: 9 mg/dL (ref 8.9–10.3)
Chloride: 102 mmol/L (ref 98–111)
Creatinine, Ser: 1.21 mg/dL — ABNORMAL HIGH (ref 0.44–1.00)
GFR calc Af Amer: 57 mL/min — ABNORMAL LOW
GFR calc non Af Amer: 49 mL/min — ABNORMAL LOW
Glucose, Bld: 225 mg/dL — ABNORMAL HIGH (ref 70–99)
Potassium: 4.6 mmol/L (ref 3.5–5.1)
Sodium: 138 mmol/L (ref 135–145)

## 2019-05-14 MED ORDER — SERTRALINE HCL 100 MG PO TABS: 100.0000 mg | ORAL_TABLET | Freq: Every day | ORAL | 0 refills | Status: DC

## 2019-05-14 NOTE — Progress Notes (Signed)
BH MD/PA/NP OP Progress Note  05/14/2019 9:12 AM Nancy Barajas  MRN:  [id number] Interview was conducted using videoconferencing application and I verified that I was speaking with the correct person using two identifiers. I discussed the limitations of evaluation and management by telemedicine and  the availability of in person appointments. Patient expressed understanding and agreed to proceed.  Chief Complaint: "I feel better".  HPI: 59 yo DAAF with long hx of anxiety and some depression. She has been in counseling for the past few months and finds it helpful. She admits to worrying about variety of things: heath, daughter, finances. COVID pandemia did not help either.  She also reports feeling somewhat depressed, tired, having occasional difficulty with night sleep and napping duirng the day. She denies feeling hopeless or suicidal. She had been prescribed sertraline 50 mg over 30 years ago; took it for a month or so but it did not help with anxiety. She has never been psychiatrically hospitalized. She denies having hx of mania, psychosis, alcohol or drug abuse. We have added sertraline 100 mg daily (was on 50 mg for first week). She tolerates it well and reports that anxiety has subsided (denies feeling depressed). She has not yet tried trazodone which I added on as needed basis for iInsomnia.  Visit Diagnosis:    ICD-10-CM   1. GAD (generalized anxiety disorder)  F41.1   2. Depressive disorder  F32.9     Past Psychiatric History: Please see intake H&P.  Past Medical History:  Past Medical History:  Diagnosis Date  . Asthma   . Diabetes mellitus without complication ([place])   . Hypertension   . Kidney stones   . Vertigo    when laying flat  . Vocal cord dysfunction    makes it hard for patient to talk and breathe    Past Surgical History:  Procedure Laterality Date  . LITHOTRIPSY    . RIGHT/LEFT HEART CATH AND CORONARY ANGIOGRAPHY Bilateral 04/08/2019   Procedure: RIGHT/LEFT  HEART CATH AND CORONARY ANGIOGRAPHY;  Surgeon: Wellington Hampshire, MD;  Location: [place] [facility];  Service: Cardiovascular;  Laterality: Bilateral;    Family Psychiatric History: None.  Family History:  Family History  Problem Relation Age of Onset  . Hypertension Mother   . Diabetes Mother   . Hypertension Father   . Diabetes Father     Social History:  Social History   Socioeconomic History  . Marital status: Divorced    Spouse name: Not on file  . Number of children: 2  . Years of education: Not on file  . Highest education level: 9th grade  Occupational History  . Not on file  Tobacco Use  . Smoking status: Former Smoker    Packs/day: 1.00    Years: 10.00    Pack years: 10.00    Quit date: 01/03/1998    Years since quitting: 21.3  . Smokeless tobacco: Never Used  Substance and Sexual Activity  . Alcohol use: Never  . Drug use: Never  . Sexual activity: Not Currently  Other Topics Concern  . Not on file  Social History Narrative   Lives at home    2 daughters as of 04/04/19 9 y.o daughter lives with her (she works)   Patient is on disability.   Social Determinants of Health   Financial Resource Strain: High Risk  . Difficulty of Paying Living Expenses: Hard  Food Insecurity: No Food Insecurity  . Worried About Charity fundraiser in the Last  Year: Never true  . Ran Out of Food in the Last Year: Never true  Transportation Needs: No Transportation Needs  . Lack of Transportation (Medical): No  . Lack of Transportation (Non-Medical): No  Physical Activity: Unknown  . Days of Exercise per Week: 0 days  . Minutes of Exercise per Session: Not on file  Stress: No Stress Concern Present  . Feeling of Stress : Only a little  Social Connections: Unknown  . Frequency of Communication with Friends and Family: Three times a week  . Frequency of Social Gatherings with Friends and Family: More than three times a week  . Attends Religious Services: Never  . Active  Member of Clubs or Organizations: Yes  . Attends Archivist Meetings: Never  . Marital Status: Not on file    Allergies:  Allergies  Allergen Reactions  . Penicillins Rash    Did it involve swelling of the face/tongue/throat, SOB, or low BP? No Did it involve sudden or severe rash/hives, skin peeling, or any reaction on the inside of your mouth or nose? No Did you need to seek medical attention at a hospital or doctor's office? No When did it last happen?Childhood If all above answers are "NO", may proceed with cephalosporin use.    Metabolic Disorder Labs: Lab Results  Component Value Date   HGBA1C 8.6 (H) 02/20/2019   No results found for: PROLACTIN Lab Results  Component Value Date   CHOL 160 02/20/2019   TRIG 63.0 02/20/2019   HDL 79.70 02/20/2019   CHOLHDL 2 02/20/2019   VLDL 12.6 02/20/2019   LDLCALC 67 02/20/2019   LDLCALC 87 11/07/2018   Lab Results  Component Value Date   TSH 0.93 11/07/2018   TSH 1.36 08/07/2018    Therapeutic Level Labs: No results found for: LITHIUM No results found for: VALPROATE No components found for:  CBMZ  Current Medications: Current Outpatient Medications  Medication Sig Dispense Refill  . blood glucose meter kit and supplies Dispense based on patient and insurance preference. Use up to four times daily as directed. (FOR ICD-10 E10.9, E11.9). 1 each 0  . carvedilol (COREG) 25 MG tablet Take 1 tablet (25 mg total) by mouth 2 (two) times daily. 60 tablet 5  . Cholecalciferol (VITAMIN D-3) 125 MCG (5000 UT) TABS Take 1 tablet by mouth daily. (Patient taking differently: Take 5,000 Units by mouth daily. ) 90 tablet 3  . Continuous Blood Gluc Receiver (FREESTYLE LIBRE 14 DAY READER) DEVI 1 Device by Does not apply route every 14 (fourteen) days. 2 each 11  . Continuous Blood Gluc Sensor (FREESTYLE LIBRE 14 DAY SENSOR) MISC 1 Device by Does not apply route every 14 (fourteen) days. 2 each 11  . Dulaglutide (TRULICITY) 3  ZO/1.0RU SOPN Inject 3 mg into the skin once a week. 5 pen 5  . glucose blood test strip relion strips Use as instructed check sugar fasting in am and 15-20 minutes before lunch and dinner then at night 420 each 12  . insulin lispro (HUMALOG KWIKPEN) 100 UNIT/ML KwikPen Check sugar 15-20 minutes before meals and give insulin based on sugar reading 70-130 0 units, 131-180 4 units, 181-240 8 units, 241-300 10 units, 301-350 12 units, 351-400 16 units, >400 20 units and call the doctor (Patient taking differently: Inject 4-16 Units into the skin See admin instructions. Check sugar 15-20 minutes before meals and give insulin based on sugar reading 70-130 0 units, 131-180 4 units, 181-240 8 units, 241-300 10 units, 301-350  12 units, 351-400 16 units, >400 20 units and call the doctor) 5 pen 11  . Insulin Pen Needle (PEN NEEDLES) 31G X 8 MM MISC 1 Device by Does not apply route in the morning, at noon, and at bedtime. Use for humalog 300 each 12  . Multiple Vitamins-Minerals (CENTRUM SILVER 50+WOMEN PO) Take 1 tablet by mouth daily.    . ReliOn Ultra Thin Lancets MISC 1 Device by Does not apply route 4 (four) times daily -  before meals and at bedtime. relion brand 400 each 12  . rosuvastatin (CRESTOR) 10 MG tablet Take 10 mg by mouth every evening.     . sacubitril-valsartan (ENTRESTO) 97-103 MG Take 1 tablet by mouth 2 (two) times daily. 60 tablet 5  . sertraline (ZOLOFT) 100 MG tablet Take 1 tablet (100 mg total) by mouth daily. 90 tablet 0  . sodium chloride (OCEAN) 0.65 % SOLN nasal spray Place 1 spray into both nostrils as needed for congestion.    Marland Kitchen spironolactone (ALDACTONE) 25 MG tablet Take 1 tablet (25 mg total) by mouth daily. 30 tablet 5  . traZODone (DESYREL) 100 MG tablet Take 1 tablet (100 mg total) by mouth at bedtime as needed for sleep. 30 tablet 1   No current facility-administered medications for this visit.      Psychiatric Specialty Exam: Review of Systems   Psychiatric/Behavioral: The patient is nervous/anxious.   All other systems reviewed and are negative.   Last menstrual period 01/03/2013.There is no height or weight on file to calculate BMI.  General Appearance: Casual and Well Groomed  Eye Contact:  Good  Speech:  Clear and Coherent and Normal Rate  Volume:  Normal  Mood:  Less anxious.  Affect:  Full Range  Thought Process:  Goal Directed and Linear  Orientation:  Full (Time, Place, and Person)  Thought Content: Logical   Suicidal Thoughts:  No  Homicidal Thoughts:  No  Memory:  Immediate;   Good Recent;   Good Remote;   Good  Judgement:  Good  Insight:  Fair  Psychomotor Activity:  Normal  Concentration:  Concentration: Good  Recall:  Good  Fund of Knowledge: Good  Language: Good  Akathisia:  Negative  Handed:  Right  AIMS (if indicated): not done  Assets:  Communication Skills Desire for Improvement Housing Social Support  ADL's:  Intact  Cognition: WNL  Sleep:  Fair   Screenings: GAD-7     Office Visit from 04/04/2019 in [facility]  Total GAD-7 Score  11    PHQ2-9     Patient Outreach Telephone from 05/01/2019 in [facility] Visit from 04/04/2019 in [facility] Nutrition from 03/27/2019 in [facility] from 03/01/2019 in [facility] Office Visit from 11/07/2018 in [facility]  PHQ-2 Total Score  _0 0  3  PHQ-9 Total Score  _1 --  10       Assessment and Plan: 59 yo DAAF with long hx of anxiety and some depression. She has been in counseling for the past few months and finds it helpful. She admits to worrying about variety of things: heath, daughter, finances. COVID pandemia did not help either.  She also reports feeling somewhat depressed, tired, having occasional difficulty with night sleep and napping duirng the day. She denies feeling hopeless or suicidal. She had been  prescribed sertraline 50 mg over 30  years ago; took it for a month or so but it did not help with anxiety. She has never been psychiatrically hospitalized. She denies having hx of mania, psychosis, alcohol or drug abuse. We have added sertraline 100 mg daily (was on 50 mg for first week). She tolerates it well and reports that anxiety has subsided (denies feeling depressed). She has not yet tried trazodone for iInsomnia.  Dx: Generalized anxiety disorder; Depressive disorder unspecified  Plan: We will continue sertraline 100 mg daily and trazodone 50 mg prn insomnia. She will continue counseling which she finds helpful. Next appointment in 2 months. The plan was discussed with patient who had an opportunity to ask questions and these were all answered. I spend 20 minutes in videoconferencing with the patient.     Stephanie Acre, MD 05/14/2019, 9:12 AM

## 2019-05-17 ENCOUNTER — Ambulatory Visit: Payer: Medicare Other | Admitting: Psychology

## 2019-05-20 ENCOUNTER — Other Ambulatory Visit: Payer: Self-pay | Admitting: *Deleted

## 2019-05-20 ENCOUNTER — Encounter: Payer: Self-pay | Admitting: *Deleted

## 2019-05-20 NOTE — Patient Outreach (Addendum)
[name]) Care Management [name] Telephone Outreach  05/20/2019  Nancy Barajas 10-09-1960 [id number]  Successful incoming telephone outreach from [name], 59 y/o female referred to [name] RN CM on April 25, 2019 by [name] for routine PCP referral for DM management and new diagnosis of CHF.  Patient has had no recent unplanned hospitalizations.  Patient has history including, but not limited to, HTN/ HLD; DM- typee II with most recent A1-C of 8.6 (8.6- Feb 20, 2019); CKD- III; and anxiety/ depression.  HIPAA/ identity verified; patient reports she was contacting me today to re-schedule previously scheduled phone call appointment as something has come up in her schedule.  We agreed to complete call today, earlier than previously scheduled.  Today, patient reports "doing fine," and she denies pain, clinical concerns, new/ recent falls.  She sounds to be in no distress throughout phone call today.   Patient further reports:  -- Has all medicationsand takes as prescribed;denies questions/ concerns around current medications, confirms that she icontinues actively working with [name], embedded at PCP office- encouraged her ongoing engagement with [name]. Reports [name] is assisting her to obtain CGM system and with siding scale insulin optimization ---- Continues to  verbalizes good general understanding of the purpose, dosing, and scheduling of medications; medication review was completed with patient and no issues/ concerns/ discrepancies identified  -- All upcoming provider appointments were reviewed with patient today; patient verbalizes accurate understanding of all post-office visit instructions and plans to attend as scheduled upcoming provider office visits; continues driving self to appointments ---- Monday 05/27/19: cardiology appointment ---- psychiatric provider (depression follow up) "somtime later this month;" reports on her  calendar, which is not near her; reports to be virtual visit  Self-health management of chronic disease states of DM and CHF: -- monitors/ record blood sugars at home 2-4 times per day; reports no low readings; blood sugars reviewed with patient ---- fasting ranges reported between 106-133 ---- post-prandial ranges reported between 94-204, including isolated high/s/ lows: consistently between 120-180 ---- has initiated food journaling; reports helpful in interpreting isolated values at home ---- using teach back method, reviewed previously provided education around significance of A1-C values along with specific patient trends over last year ---- continues "trying" to "eat the right foods;" reports follows low carb/ low sugar diet  -- has begun daily weight monitoring at home, as we discussed at time of last outreach; all weights form home reviewed with patient ---- weights at home consistently between 225-228, without any overnight gains > 3 lbs and no weekly gains > 5 lbs ---- using teach back method, confirmed that patient is able to verbalize weight gain guidelines in setting of CHF along with corresponding action plan for same with minimal prompting ---- using teach back method, discussed other signs/ symptoms CHF yellow zone with patient: denies current signs/ symptoms CHF yellow zone ---- coached patient around talking points to discuss with cardiology provider at next week's upcoming scheduled appointment, including target weight and individualized action plan for development of signs/ symptoms CHF yellow zone ---- encouraged patient to take her daily weight record to upcoming cardiology appointment  Confirmed with patient that I had previously mailed printed educational material around self-health management of chronic disease states of DM and CHF; patient states that she has not looked at her mail in quite some time; encouraged her to do so and to review educational material promptly; she  is agreeable  Patient denies further issues, concerns,  or problems today. I confirmed that patient hasmy direct phone number, the main [name] office phone number, and the [name] 24-hour nurse advice phone number should issues arise prior to next scheduled [name] outreach next month.  Encouraged patient to contact me directly if needs, questions, issues, or concerns arise prior to next scheduled outreach; patient agreed to do so.  Plan:  Patient will take medications as prescribed and will attend all scheduled provider appointments  Patient will promptly notify care providers for any new concerns/ issues/ problems that arise  Patient will continue monitoring and recording blood sugars at home 2-4 times per day and will begin journaling food intake  Patient will continue monitoring/ recording daily weights at home  I will share today's notes/ care plan from [place] outreach with PCP as initial assessment   [name] outreach to continue with scheduled phone call next month  [name] CM Care Plan Problem One     Most Recent Value  Care Plan Problem One  Self- health management of chronic disease states of DM and CHF, as evidenced by patient reporting  Role Documenting the Problem One  Care Management Coordinator  Care Plan for Problem One  Active  [facility] Long Term Goal   Over the next 60 days, patient will continue monitoring and recording blood sugars 2-4 times per day, as evidenced by patient reporting/ review of same during [name] RN CM outreach  [name] Long Term Goal Start Date  05/01/19  Interventions for Problem One Long Term Goal  Confirmed that patient continues monitoring and recording blood sugars at home,  reviewed all recent blood sugars with patient and completed medication review  [name] Short Term Goal #1   Over the next 30 days, patient will continue daily monitoring and recording of weights at home, as evidenced by patient reporting/ review of same during [name] RN CM outreach [Goal  modified and extended today]  [name] Short Term Goal #1 Start Date  05/20/19 [Goal re-established/ extended]  Interventions for Short Term Goal #1  Confirmed that patient did initiate and continues monitoring daily weights at home,  reviewed all recent weights and using teach back method, reviewed with patient previously provided education around weight gai guidelines and corresponding action plan in setting of CHF,  provided education around signs/ symptoms yellow CHF zone and encouraged her to discuss individualized plan of cation for same with cardiology provider at upcoming schduled office visit  [name] Short Term Goal #2   Over the next 30 days, patient will attend all scheduled provider appointments, as evidenced by patient reporting and collaboration with care providers as indicated during [facility] RN CM review  [name] Short Term Goal #2 Start Date  05/01/19  [facility] CM Short Term Goal #2 Met Date  05/20/19 [Goal Met]  Interventions for Short Term Goal #2  Confirmed that patient has accurate understanding of scheduled provider appointments and continues driving self to appointments,  discussed/ coached patient on talking points to cover with cardiology provider, including target weight and individualized action plan for weight gain/ yellow CHF zone     Oneta Rack, RN, BSN, [employer] Coordinator [facility] Care Management  [phone]

## 2019-05-22 ENCOUNTER — Ambulatory Visit: Payer: Self-pay | Admitting: *Deleted

## 2019-05-27 ENCOUNTER — Other Ambulatory Visit: Payer: Self-pay

## 2019-05-27 ENCOUNTER — Ambulatory Visit (INDEPENDENT_AMBULATORY_CARE_PROVIDER_SITE_OTHER): Payer: Medicare Other | Admitting: Psychology

## 2019-05-27 ENCOUNTER — Ambulatory Visit (INDEPENDENT_AMBULATORY_CARE_PROVIDER_SITE_OTHER): Payer: Medicare Other | Admitting: Cardiology

## 2019-05-27 ENCOUNTER — Encounter: Payer: Self-pay | Admitting: Cardiology

## 2019-05-27 VITALS — BP 136/88 | HR 92 | Ht 71.0 in | Wt 230.0 lb

## 2019-05-27 DIAGNOSIS — I428 Other cardiomyopathies: Secondary | ICD-10-CM | POA: Diagnosis not present

## 2019-05-27 DIAGNOSIS — F418 Other specified anxiety disorders: Secondary | ICD-10-CM

## 2019-05-27 DIAGNOSIS — I10 Essential (primary) hypertension: Secondary | ICD-10-CM | POA: Diagnosis not present

## 2019-05-27 MED ORDER — DAPAGLIFLOZIN PROPANEDIOL 10 MG PO TABS: 10.0000 mg | ORAL_TABLET | Freq: Every day | ORAL | 3 refills | Status: DC

## 2019-05-27 NOTE — Progress Notes (Signed)
Cardiology Office Note:    Date:  05/27/2019   ID:  Nancy Barajas, DOB 10/23/1960, MRN [id number]  PCP:  McLean-Scocuzza, Nino Glow, MD  Cardiologist:  Kate Sable, MD  Electrophysiologist:  None   Referring MD: McLean-Scocuzza, Olivia Mackie *   Chief Complaint  Patient presents with  . Other    4 week follow up . Meds reviewed verbally with patient.     History of Present Illness:    Nancy Barajas is a 59 y.o. female with a hx of hypertension, nonischemic cardiomyopathy EF 25-30%, diabetes, hyperlipidemia, CKD stage III, who presents for follow-up.  He is being seen for nonischemic cardiomyopathy and medication titration.  Coreg and Entresto were titrated after last visit.  Denies edema, chest pain or shortness of breath.  Tolerating medications okay, no side effects.   Past Medical History:  Diagnosis Date  . Asthma   . Diabetes mellitus without complication ([place])   . Hypertension   . Kidney stones   . Vertigo    when laying flat  . Vocal cord dysfunction    makes it hard for patient to talk and breathe    Past Surgical History:  Procedure Laterality Date  . LITHOTRIPSY    . RIGHT/LEFT HEART CATH AND CORONARY ANGIOGRAPHY Bilateral 04/08/2019   Procedure: RIGHT/LEFT HEART CATH AND CORONARY ANGIOGRAPHY;  Surgeon: Wellington Hampshire, MD;  Location: [facility];  Service: Cardiovascular;  Laterality: Bilateral;    Current Medications: Current Meds  Medication Sig  . blood glucose meter kit and supplies Dispense based on patient and insurance preference. Use up to four times daily as directed. (FOR ICD-10 E10.9, E11.9).  . carvedilol (COREG) 25 MG tablet Take 1 tablet (25 mg total) by mouth 2 (two) times daily.  . Cholecalciferol (VITAMIN D-3) 125 MCG (5000 UT) TABS Take 1 tablet by mouth daily. (Patient taking differently: Take 5,000 Units by mouth daily. )  . Dulaglutide (TRULICITY) 3 KL/4.9ZP SOPN Inject 3 mg into the skin once a week.  . insulin lispro  (HUMALOG KWIKPEN) 100 UNIT/ML KwikPen Check sugar 15-20 minutes before meals and give insulin based on sugar reading 70-130 0 units, 131-180 4 units, 181-240 8 units, 241-300 10 units, 301-350 12 units, 351-400 16 units, >400 20 units and call the doctor (Patient taking differently: Inject 4-16 Units into the skin See admin instructions. Check sugar 15-20 minutes before meals and give insulin based on sugar reading 70-130 0 units, 131-180 4 units, 181-240 8 units, 241-300 10 units, 301-350 12 units, 351-400 16 units, >400 20 units and call the doctor)  . Insulin Pen Needle (PEN NEEDLES) 31G X 8 MM MISC 1 Device by Does not apply route in the morning, at noon, and at bedtime. Use for humalog  . Multiple Vitamins-Minerals (CENTRUM SILVER 50+WOMEN PO) Take 1 tablet by mouth daily.  . ReliOn Ultra Thin Lancets MISC 1 Device by Does not apply route 4 (four) times daily -  before meals and at bedtime. relion brand  . rosuvastatin (CRESTOR) 10 MG tablet Take 10 mg by mouth every evening.   . sacubitril-valsartan (ENTRESTO) 97-103 MG Take 1 tablet by mouth 2 (two) times daily.  . sertraline (ZOLOFT) 100 MG tablet Take 1 tablet (100 mg total) by mouth daily.  . sodium chloride (OCEAN) 0.65 % SOLN nasal spray Place 1 spray into both nostrils as needed for congestion.  Marland Kitchen spironolactone (ALDACTONE) 25 MG tablet Take 1 tablet (25 mg total) by mouth daily.  . traZODone (DESYREL) 100  MG tablet Take 1 tablet (100 mg total) by mouth at bedtime as needed for sleep.     Allergies:   Penicillins   Social History   Socioeconomic History  . Marital status: Divorced    Spouse name: Not on file  . Number of children: 2  . Years of education: Not on file  . Highest education level: 9th grade  Occupational History  . Not on file  Tobacco Use  . Smoking status: Former Smoker    Packs/day: 1.00    Years: 10.00    Pack years: 10.00    Quit date: 01/03/1998    Years since quitting: 21.4  . Smokeless tobacco: Never  Used  Substance and Sexual Activity  . Alcohol use: Never  . Drug use: Never  . Sexual activity: Not Currently  Other Topics Concern  . Not on file  Social History Narrative   Lives at home    2 daughters as of 04/04/19 70 y.o daughter lives with her (she works)   Patient is on disability.   Social Determinants of Health   Financial Resource Strain: High Risk  . Difficulty of Paying Living Expenses: Hard  Food Insecurity: No Food Insecurity  . Worried About Charity fundraiser in the Last Year: Never true  . Ran Out of Food in the Last Year: Never true  Transportation Needs: No Transportation Needs  . Lack of Transportation (Medical): No  . Lack of Transportation (Non-Medical): No  Physical Activity: Unknown  . Days of Exercise per Week: 0 days  . Minutes of Exercise per Session: Not on file  Stress: No Stress Concern Present  . Feeling of Stress : Only a little  Social Connections: Unknown  . Frequency of Communication with Friends and Family: Three times a week  . Frequency of Social Gatherings with Friends and Family: More than three times a week  . Attends Religious Services: Never  . Active Member of Clubs or Organizations: Yes  . Attends Archivist Meetings: Never  . Marital Status: Not on file     Family History: The patient's family history includes Diabetes in her father and mother; Hypertension in her father and mother.  ROS:   Please see the history of present illness.     All other systems reviewed and are negative.  EKGs/Labs/Other Studies Reviewed:    The following studies were reviewed today:   EKG:  EKG is  ordered today.  The ekg ordered today demonstrates normal sinus rhythm, left bundle branch block  Recent Labs: 11/07/2018: TSH 0.93 02/20/2019: ALT 11 04/02/2019: Hemoglobin 11.9; Platelets 376 05/14/2019: BUN 27; Creatinine, Ser 1.21; Potassium 4.6; Sodium 138  Recent Lipid Panel    Component Value Date/Time   CHOL 160 02/20/2019 0851     TRIG 63.0 02/20/2019 0851   HDL 79.70 02/20/2019 0851   CHOLHDL 2 02/20/2019 0851   VLDL 12.6 02/20/2019 0851   LDLCALC 67 02/20/2019 0851    Physical Exam:    VS:  BP 136/88 (BP Location: Left Arm, Patient Position: Sitting, Cuff Size: Normal)   Pulse 92   Ht '5\' 11"'  (1.803 m)   Wt 230 lb (104.3 kg)   LMP 01/03/2013   SpO2 97%   BMI 32.08 kg/m     Wt Readings from Last 3 Encounters:  05/27/19 230 lb (104.3 kg)  04/29/19 230 lb (104.3 kg)  04/08/19 244 lb (110.7 kg)     GEN:  Well nourished, well developed in no acute  distress, obese HEENT: Normal NECK: No JVD; No carotid bruits LYMPHATICS: No lymphadenopathy CARDIAC: RRR, no murmurs, rubs, gallops RESPIRATORY:  Clear to auscultation without rales, wheezing or rhonchi  ABDOMEN: Soft, non-tender, non-distended MUSCULOSKELETAL:  No edema; No deformity  SKIN: Warm and dry NEUROLOGIC:  Alert and oriented x 3 PSYCHIATRIC:  Normal affect   ASSESSMENT:    1. NICM (nonischemic cardiomyopathy) ([place])   2. Essential hypertension    PLAN:    In order of problems listed above:  1. Patient with nonischemic cardiomyopathy as left heart cath showed normal coronary arteries, echocardiogram obtained due to LVH on EKG.  Echo showed severely reduced ejection fraction with EF 25 to 30%.  Mild LVH.  Patient is euvolemic.  NYHA class II symptoms.  Cardiomyopathy likely due to long history of uncontrolled hypertension.  Continue Entresto to 97/103 mg twice daily, Coreg 25 mg twice daily twice daily, Aldactone 25 mg daily.  Start Farxiga 10 mg daily especially as patient is also a diabetic.  We will plan for repeat echocardiogram in 3 months.  If ejection fraction is still less than 35%, will consider EP referral for BiV pacing/ICD as patient has a left bundle branch block on ECG.  Patient is euvolemic, no need for Lasix. 2. History of hypertension, blood pressure is now controlled, .  Continue Coreg 25 mg twice daily, Entresto,  Aldactone.  Follow-up in 1 month  Total encounter time 45 minutes  Greater than 50% was spent in counseling and coordination of care with the patient Time spent educating patient on ICD indications, BiV pacing, medication management for heart failure.  All questions were answered.  This note was generated in part or whole with voice recognition software. Voice recognition is usually quite accurate but there are transcription errors that can and very often do occur. I apologize for any typographical errors that were not detected and corrected.  Medication Adjustments/Labs and Tests Ordered: Current medicines are reviewed at length with the patient today.  Concerns regarding medicines are outlined above.  Orders Placed This Encounter  Procedures  . EKG 12-Lead  . ECHOCARDIOGRAM COMPLETE   Meds ordered this encounter  Medications  . dapagliflozin propanediol (FARXIGA) 10 MG TABS tablet    Sig: Take 1 tablet (10 mg total) by mouth daily before breakfast.    Dispense:  30 tablet    Refill:  3    Patient Instructions  Medication Instructions:  Start taking DAPAGLIFLOZIN (Farxiga) 64m (1 tablet) by mouth once daily. *If you need a refill on your cardiac medications before your next appointment, please call your pharmacy*   Lab Work: None Ordered. If you have labs (blood work) drawn today and your tests are completely normal, you will receive your results only by: .Marland KitchenMyChart Message (if you have MyChart) OR . A paper copy in the mail If you have any lab test that is abnormal or we need to change your treatment, we will call you to review the results.   Testing/Procedures: Your physician has requested that you have an echocardiogram in 3 months. Echocardiography is a painless test that uses sound waves to create images of your heart. It provides your doctor with information about the size and shape of your heart and how well your heart's chambers and valves are working. This procedure  takes approximately one hour. There are no restrictions for this procedure.     Follow-Up: [facility] and your [facility] needs are our priority.  As part of our continuing  mission to provide you with exceptional heart care, we have created designated Provider Care Teams.  These Care Teams include your primary Cardiologist (physician) and Advanced Practice Providers (APPs -  Physician Assistants and Nurse Practitioners) who all work together to provide you with the care you need, when you need it.  We recommend signing up for the patient portal called "MyChart".  Sign up information is provided on this After Visit Summary.  MyChart is used to connect with patients for Virtual Visits (Telemedicine).  Patients are able to view lab/test results, encounter notes, upcoming appointments, etc.  Non-urgent messages can be sent to your provider as well.   To learn more about what you can do with MyChart, go to [email or web address].    Your next appointment:   After Echocardiogram to be done in 3 months   The format for your next appointment:   In Person  Provider:   Kate Sable, MD   Other Instructions  Dapagliflozin tablets What is this medicine? DAPAGLIFLOZIN (DAP a gli FLOE zin) controls blood sugar in people with diabetes. It is used with lifestyle changes like diet and exercise. It also treats heart failure. It may lower the need for treatment of heart failure in the hospital. This medicine may be used for other purposes; ask your health care provider or pharmacist if you have questions. COMMON BRAND NAME(S): Wilder Glade What should I tell my health care provider before I take this medicine? They need to know if you have any of these conditions:  dehydration  diabetic ketoacidosis  diet low in salt  eating less due to illness, surgery, dieting, or any other reason  having surgery  history of pancreatitis or pancreas problems  history of yeast infection of the penis  or vagina  if you often drink alcohol  infections in the bladder, kidneys, or urinary tract  kidney disease  low blood pressure  on hemodialysis  problems urinating  type 1 diabetes  uncircumcised female  an unusual or allergic reaction to dapagliflozin, other medicines, foods, dyes, or preservatives  pregnant or trying to get pregnant  breast-feeding How should I use this medicine? Take this medicine by mouth with a glass of water. Follow the directions on the prescription label. You can take it with or without food. If it upsets your stomach, take it with food. Take this medicine in the morning. Take your dose at the same time each day. Do not take more often than directed. Do not stop taking except on your doctor's advice. A special MedGuide will be given to you by the pharmacist with each prescription and refill. Be sure to read this information carefully each time. Talk to your pediatrician regarding the use of this medicine in children. Special care may be needed. Overdosage: If you think you have taken too much of this medicine contact a poison control center or emergency room at once. NOTE: This medicine is only for you. Do not share this medicine with others. What if I miss a dose? If you miss a dose, take it as soon as you can. If it is almost time for your next dose, take only that dose. Do not take double or extra doses. What may interact with this medicine? Do not take this medicine with any of the following medications:  gatifloxacin This medicine may also interact with the following medications:  alcohol  certain medicines for blood pressure, heart disease  diuretics  insulin  nateglinide  pioglitazone  quinolone antibiotics like ciprofloxacin, levofloxacin, ofloxacin  repaglinide  some herbal dietary supplements  steroid medicines like prednisone or cortisone  sulfonylureas like glimepiride, glipizide, glyburide  thyroid medicine This list may  not describe all possible interactions. Give your health care provider a list of all the medicines, herbs, non-prescription drugs, or dietary supplements you use. Also tell them if you smoke, drink alcohol, or use illegal drugs. Some items may interact with your medicine. What should I watch for while using this medicine? Visit your doctor or health care professional for regular checks on your progress. This medicine can cause a serious condition in which there is too much acid in the blood. If you develop nausea, vomiting, stomach pain, unusual tiredness, or breathing problems, stop taking this medicine and call your doctor right away. If possible, use a ketone dipstick to check for ketones in your urine. A test called the HbA1C (A1C) will be monitored. This is a simple blood test. It measures your blood sugar control over the last 2 to 3 months. You will receive this test every 3 to 6 months. Learn how to check your blood sugar. Learn the symptoms of low and high blood sugar and how to manage them. Always carry a quick-source of sugar with you in case you have symptoms of low blood sugar. Examples include hard sugar candy or glucose tablets. Make sure others know that you can choke if you eat or drink when you develop serious symptoms of low blood sugar, such as seizures or unconsciousness. They must get medical help at once. Tell your doctor or health care professional if you have high blood sugar. You might need to change the dose of your medicine. If you are sick or exercising more than usual, you might need to change the dose of your medicine. Do not skip meals. Ask your doctor or health care professional if you should avoid alcohol. Many nonprescription cough and cold products contain sugar or alcohol. These can affect blood sugar. Wear a medical ID bracelet or chain, and carry a card that describes your disease and details of your medicine and dosage times. What side effects may I notice from  receiving this medicine? Side effects that you should report to your doctor or health care professional as soon as possible:  allergic reactions like skin rash, itching or hives, swelling of the face, lips, or tongue  breathing problems  dizziness  feeling faint or lightheaded, falls  muscle weakness  nausea, vomiting, unusual stomach upset or pain  new pain or tenderness, change in skin color, sores or ulcers, or infection in legs or feet  penile discharge, itching, or pain in men  signs and symptoms of a genital infection, such as fever; tenderness, redness, or swelling in the genitals or area from the genitals to the back of the rectum  signs and symptoms of low blood sugar such as feeling anxious, confusion, dizziness, increased hunger, unusually weak or tired, sweating, shakiness, cold, irritable, headache, blurred vision, fast heartbeat, loss of consciousness  signs and symptoms of a urinary tract infection, such as fever, chills, a burning feeling when urinating, blood in the urine, back pain  trouble passing urine or change in the amount of urine, including an urgent need to urinate more often, in larger amounts, or at night  unusual tiredness  vaginal discharge, itching, or odor in women Side effects that usually do not require medical attention (report to your doctor or health care professional if they continue or are bothersome):  mild increase in urination  thirsty  This list may not describe all possible side effects. Call your doctor for medical advice about side effects. You may report side effects to FDA at 1-800-FDA-1088. Where should I keep my medicine? Keep out of the reach of children. Store at room temperature between 15 and 30 degrees C (59 and 86 degrees F). Throw away any unused medicine after the expiration date. NOTE: This sheet is a summary. It may not cover all possible information. If you have questions about this medicine, talk to your doctor,  pharmacist, or health care provider.  2020 Elsevier/Gold Standard (2018-05-10 18:58:14)      Signed, Kate Sable, MD  05/27/2019 11:08 AM    [facility]

## 2019-05-27 NOTE — Patient Instructions (Signed)
Medication Instructions:  Start taking DAPAGLIFLOZIN Wilder Glade) 10mg  (1 tablet) by mouth once daily. *If you need a refill on your cardiac medications before your next appointment, please call your pharmacy*   Lab Work: None Ordered. If you have labs (blood work) drawn today and your tests are completely normal, you will receive your results only by: Marland Kitchen MyChart Message (if you have MyChart) OR . A paper copy in the mail If you have any lab test that is abnormal or we need to change your treatment, we will call you to review the results.   Testing/Procedures: Your physician has requested that you have an echocardiogram in 3 months. Echocardiography is a painless test that uses sound waves to create images of your heart. It provides your doctor with information about the size and shape of your heart and how well your heart's chambers and valves are working. This procedure takes approximately one hour. There are no restrictions for this procedure.     Follow-Up: At [facility], you and your health needs are our priority.  As part of our continuing mission to provide you with exceptional heart care, we have created designated Provider Care Teams.  These Care Teams include your primary Cardiologist (physician) and Advanced Practice Providers (APPs -  Physician Assistants and Nurse Practitioners) who all work together to provide you with the care you need, when you need it.  We recommend signing up for the patient portal called "MyChart".  Sign up information is provided on this After Visit Summary.  MyChart is used to connect with patients for Virtual Visits (Telemedicine).  Patients are able to view lab/test results, encounter notes, upcoming appointments, etc.  Non-urgent messages can be sent to your provider as well.   To learn more about what you can do with MyChart, go to [email or web address].    Your next appointment:   After Echocardiogram to be done in 3 months   The format for  your next appointment:   In Person  Provider:   Kate Sable, MD   Other Instructions  Dapagliflozin tablets What is this medicine? DAPAGLIFLOZIN (DAP a gli FLOE zin) controls blood sugar in people with diabetes. It is used with lifestyle changes like diet and exercise. It also treats heart failure. It may lower the need for treatment of heart failure in the hospital. This medicine may be used for other purposes; ask your health care provider or pharmacist if you have questions. COMMON BRAND NAME(S): Wilder Glade What should I tell my health care provider before I take this medicine? They need to know if you have any of these conditions:  dehydration  diabetic ketoacidosis  diet low in salt  eating less due to illness, surgery, dieting, or any other reason  having surgery  history of pancreatitis or pancreas problems  history of yeast infection of the penis or vagina  if you often drink alcohol  infections in the bladder, kidneys, or urinary tract  kidney disease  low blood pressure  on hemodialysis  problems urinating  type 1 diabetes  uncircumcised female  an unusual or allergic reaction to dapagliflozin, other medicines, foods, dyes, or preservatives  pregnant or trying to get pregnant  breast-feeding How should I use this medicine? Take this medicine by mouth with a glass of water. Follow the directions on the prescription label. You can take it with or without food. If it upsets your stomach, take it with food. Take this medicine in the morning. Take your dose at the same time each  day. Do not take more often than directed. Do not stop taking except on your doctor's advice. A special MedGuide will be given to you by the pharmacist with each prescription and refill. Be sure to read this information carefully each time. Talk to your pediatrician regarding the use of this medicine in children. Special care may be needed. Overdosage: If you think you have taken  too much of this medicine contact a poison control center or emergency room at once. NOTE: This medicine is only for you. Do not share this medicine with others. What if I miss a dose? If you miss a dose, take it as soon as you can. If it is almost time for your next dose, take only that dose. Do not take double or extra doses. What may interact with this medicine? Do not take this medicine with any of the following medications:  gatifloxacin This medicine may also interact with the following medications:  alcohol  certain medicines for blood pressure, heart disease  diuretics  insulin  nateglinide  pioglitazone  quinolone antibiotics like ciprofloxacin, levofloxacin, ofloxacin  repaglinide  some herbal dietary supplements  steroid medicines like prednisone or cortisone  sulfonylureas like glimepiride, glipizide, glyburide  thyroid medicine This list may not describe all possible interactions. Give your health care provider a list of all the medicines, herbs, non-prescription drugs, or dietary supplements you use. Also tell them if you smoke, drink alcohol, or use illegal drugs. Some items may interact with your medicine. What should I watch for while using this medicine? Visit your doctor or health care professional for regular checks on your progress. This medicine can cause a serious condition in which there is too much acid in the blood. If you develop nausea, vomiting, stomach pain, unusual tiredness, or breathing problems, stop taking this medicine and call your doctor right away. If possible, use a ketone dipstick to check for ketones in your urine. A test called the HbA1C (A1C) will be monitored. This is a simple blood test. It measures your blood sugar control over the last 2 to 3 months. You will receive this test every 3 to 6 months. Learn how to check your blood sugar. Learn the symptoms of low and high blood sugar and how to manage them. Always carry a quick-source  of sugar with you in case you have symptoms of low blood sugar. Examples include hard sugar candy or glucose tablets. Make sure others know that you can choke if you eat or drink when you develop serious symptoms of low blood sugar, such as seizures or unconsciousness. They must get medical help at once. Tell your doctor or health care professional if you have high blood sugar. You might need to change the dose of your medicine. If you are sick or exercising more than usual, you might need to change the dose of your medicine. Do not skip meals. Ask your doctor or health care professional if you should avoid alcohol. Many nonprescription cough and cold products contain sugar or alcohol. These can affect blood sugar. Wear a medical ID bracelet or chain, and carry a card that describes your disease and details of your medicine and dosage times. What side effects may I notice from receiving this medicine? Side effects that you should report to your doctor or health care professional as soon as possible:  allergic reactions like skin rash, itching or hives, swelling of the face, lips, or tongue  breathing problems  dizziness  feeling faint or lightheaded, falls  muscle weakness  nausea, vomiting, unusual stomach upset or pain  new pain or tenderness, change in skin color, sores or ulcers, or infection in legs or feet  penile discharge, itching, or pain in men  signs and symptoms of a genital infection, such as fever; tenderness, redness, or swelling in the genitals or area from the genitals to the back of the rectum  signs and symptoms of low blood sugar such as feeling anxious, confusion, dizziness, increased hunger, unusually weak or tired, sweating, shakiness, cold, irritable, headache, blurred vision, fast heartbeat, loss of consciousness  signs and symptoms of a urinary tract infection, such as fever, chills, a burning feeling when urinating, blood in the urine, back pain  trouble  passing urine or change in the amount of urine, including an urgent need to urinate more often, in larger amounts, or at night  unusual tiredness  vaginal discharge, itching, or odor in women Side effects that usually do not require medical attention (report to your doctor or health care professional if they continue or are bothersome):  mild increase in urination  thirsty This list may not describe all possible side effects. Call your doctor for medical advice about side effects. You may report side effects to FDA at 1-800-FDA-1088. Where should I keep my medicine? Keep out of the reach of children. Store at room temperature between 15 and 30 degrees C (59 and 86 degrees F). Throw away any unused medicine after the expiration date. NOTE: This sheet is a summary. It may not cover all possible information. If you have questions about this medicine, talk to your doctor, pharmacist, or health care provider.  2020 Elsevier/Gold Standard (2018-05-10 18:58:14)

## 2019-05-28 ENCOUNTER — Telehealth: Payer: Self-pay

## 2019-05-28 NOTE — Telephone Encounter (Signed)
Wilder Glade is a non-preferred drug on patient's Medicare prescription coverage plan. Per plan representative, preferred drug's are Jardiance (once daily), Synjardy (up to 2 tablets per day) and Synjardy XR (up to 2 tablets per day. Please advise if any of these medications would be appropriate for patient. Thank you!

## 2019-05-28 NOTE — Telephone Encounter (Signed)
Thank you for the clarification. Prior Auth completed through [employer]. Medication has been approved from 04-28-2019 through 05-27-2020. Case# [id number] Pharmacy has been informed.

## 2019-06-13 ENCOUNTER — Ambulatory Visit (INDEPENDENT_AMBULATORY_CARE_PROVIDER_SITE_OTHER): Payer: Medicare Other | Admitting: Pharmacist

## 2019-06-13 DIAGNOSIS — I5020 Unspecified systolic (congestive) heart failure: Secondary | ICD-10-CM

## 2019-06-13 DIAGNOSIS — I10 Essential (primary) hypertension: Secondary | ICD-10-CM

## 2019-06-13 DIAGNOSIS — E119 Type 2 diabetes mellitus without complications: Secondary | ICD-10-CM

## 2019-06-13 NOTE — Patient Instructions (Addendum)
[name],   It was great talking with you today!  Keep up the great work with checking your blood sugars, blood pressures, and weights daily. Here is what you are taking (and what it is for):  Morning: - Farxiga 10 mg daily (heart failure, diabetes) - Carvedilol 25 mg (blood pressure, heart rate) - Entresto 97/103 (blood pressure, heart failure) - Spironolactone 25 mg daily (blood pressure, heart failure) - Sertraline (mood)  Evening: - Rosuvastatin 10 mg daily (cholesterol) - can take in morning or evening - Carvedilol 25 mg (blood pressure, heart rate) - Entresto 97/103 (blood pressure, heart failure)  Injections:  - Trulicity 3 mg weekly - Humalog per the sliding scale   I look forward to seeing you on the 21st!  [name], PharmD, [place]   Visit Information  Goals Addressed              This Visit's Progress     Patient Stated   .  "I want to be healthy (pt-stated)        CARE PLAN ENTRY (see longtitudinal plan of care for additional care plan information)  Current Barriers:  . Diabetes: uncontrolled; complicated by CHF, CKD; most recent A1c 8.6% (though overdue for A1c) o Reports that she did receive the [place] in the mail, but isn't sure how to place it and start using it o DENIES any genital burning/itching since starting Farxiga  . Current antihyperglycemic regimen: Trulicity 3 mg weekly; Farxiga 10 mg daily, Humalog sliding scale: 70-130 0 units, 131-180 4 units, 181-240 8 units, 241-300 10 units, 301-350 12 units, 351-400 16 units o Previously on metformin, stopped d/t kidney function decline o Previously on Jardiance, stopped d/t recurrent yeast infections on low dose  . Current glucose readings- prescribed FreeStyle Libre CGM, but has not started using yet . Cardiovascular risk reduction (recent dx HFrEF, EF 25-30%); follows w/ Dr. Garen Lah o Current hypertensive regimen: carvedilol 25 mg BID, spironolactone 25 mg daily,  Entresto 97/103 mg BID  AM BP AM HR Weight (lbs) PM BP PM HR  1-Jun 115/81 81 225    2-Jun 125/88 76 223    3-Jun 123/77      4-Jun 133/86 90 222    5-Jun 112/79 88 222    6-Jun 126/72 82  133/98 72  7-Jun 136/96 78 220    8-Jun 133/81 83 222    9-Jun 127/81 79 222     o Current hyperlipidemia regimen: rosuvastatin 10 mg; LDL at goal <70 . Depression/anxiety: follows w/ Dr. Alford Highland improvement in depression on sertraline 100 mg daily; not using trazodone for sleep  Pharmacist Clinical Goal(s):  Marland Kitchen Over the next 90 days, patient with work with PharmD and primary care provider to address optimized glycemic management  Interventions: . Comprehensive medication review performed, medication list updated in electronic medical record . Inter-disciplinary care team collaboration (see longitudinal plan of care) . Scheduled face to face appointment in 2 weeks for patient to bring FreeStyle Libre CGM with her for me to teach her how to place and use her phone as reader. Will connect with LibreView so that readings can be reviewed remotely.  . Praised for documentation of daily BP and weight. Will forward note to cardiology. Appears some fluid weight loss w/ start of SGLT2  Patient Self Care Activities:  . Patient will check blood glucose BID, document, and provide at future appointments . Patient will take medications as prescribed . Patient will report any questions  or concerns to provider   Please see past updates related to this goal by clicking on the "Past Updates" button in the selected goal         The patient verbalized understanding of instructions provided today and agreed to receive a mailed copy of patient instruction and/or educational materials.  Plan:  - Will meet face to face in 2 weeks  [name], PharmD, [place], [place] [phone]

## 2019-06-13 NOTE — Chronic Care Management (AMB) (Signed)
Chronic Care Management   Follow Up Note   06/13/2019 Name: Nancy Barajas MRN: [id number] DOB: November 19, 1960  Referred by: McLean-Scocuzza, Nino Glow, MD Reason for referral : Chronic Care Management (Medication Management)   Nancy Barajas is a 59 y.o. year old female who is a primary care patient of McLean-Scocuzza, Nino Glow, MD. The CCM team was consulted for assistance with chronic disease management and care coordination needs.  Contacted patient for medication management review.     Review of patient status, including review of consultants reports, relevant laboratory and other test results, and collaboration with appropriate care team members and the patient's provider was performed as part of comprehensive patient evaluation and provision of chronic care management services.    SDOH (Social Determinants of Health) assessments performed: No See Care Plan activities for detailed interventions related to Nancy Barajas)     Outpatient Encounter Medications as of 06/13/2019  Medication Sig Note  . blood glucose meter kit and supplies Dispense based on patient and insurance preference. Use up to four times daily as directed. (FOR ICD-10 E10.9, E11.9).   . carvedilol (COREG) 25 MG tablet Take 1 tablet (25 mg total) by mouth 2 (two) times daily.   . Cholecalciferol (VITAMIN D-3) 125 MCG (5000 UT) TABS Take 1 tablet by mouth daily. (Patient taking differently: Take 5,000 Units by mouth daily. )   . dapagliflozin propanediol (FARXIGA) 10 MG TABS tablet Take 1 tablet (10 mg total) by mouth daily before breakfast.   . Dulaglutide (TRULICITY) 3 MV/6.7MC SOPN Inject 3 mg into the skin once a week.   . insulin lispro (HUMALOG KWIKPEN) 100 UNIT/ML KwikPen Check sugar 15-20 minutes before meals and give insulin based on sugar reading 70-130 0 units, 131-180 4 units, 181-240 8 units, 241-300 10 units, 301-350 12 units, 351-400 16 units, >400 20 units and call the doctor (Patient taking differently: Inject  4-16 Units into the skin See admin instructions. Check sugar 15-20 minutes before meals and give insulin based on sugar reading 70-130 0 units, 131-180 4 units, 181-240 8 units, 241-300 10 units, 301-350 12 units, 351-400 16 units, >400 20 units and call the doctor)   . Insulin Pen Needle (PEN NEEDLES) 31G X 8 MM MISC 1 Device by Does not apply route in the morning, at noon, and at bedtime. Use for humalog   . Multiple Vitamins-Minerals (CENTRUM SILVER 50+WOMEN PO) Take 1 tablet by mouth daily.   . ReliOn Ultra Thin Lancets MISC 1 Device by Does not apply route 4 (four) times daily -  before meals and at bedtime. relion brand   . rosuvastatin (CRESTOR) 10 MG tablet Take 10 mg by mouth every evening.    . sacubitril-valsartan (ENTRESTO) 97-103 MG Take 1 tablet by mouth 2 (two) times daily.   . sertraline (ZOLOFT) 100 MG tablet Take 1 tablet (100 mg total) by mouth daily.   Marland Kitchen spironolactone (ALDACTONE) 25 MG tablet Take 1 tablet (25 mg total) by mouth daily.   . sodium chloride (OCEAN) 0.65 % SOLN nasal spray Place 1 spray into both nostrils as needed for congestion.   . traZODone (DESYREL) 100 MG tablet Take 1 tablet (100 mg total) by mouth at bedtime as needed for sleep. (Patient not taking: Reported on 06/13/2019) 05/20/2019: Patient reports taking only as needed; reports taking "about once a week or once every other week"   No facility-administered encounter medications on file as of 06/13/2019.     Objective:   Goals Addressed  This Visit's Progress     Patient Stated   .  "I want to be healthy (pt-stated)        CARE PLAN ENTRY (see longtitudinal plan of care for additional care plan information)  Current Barriers:  . Diabetes: uncontrolled; complicated by CHF, CKD; most recent A1c 8.6% (though overdue for A1c) o Reports that she did receive the [place] in the mail, but isn't sure how to place it and start using it o DENIES any genital burning/itching since  starting Farxiga  . Current antihyperglycemic regimen: Trulicity 3 mg weekly; Farxiga 10 mg daily, Humalog sliding scale: 70-130 0 units, 131-180 4 units, 181-240 8 units, 241-300 10 units, 301-350 12 units, 351-400 16 units o Previously on metformin, stopped d/t kidney function decline o Previously on Jardiance, stopped d/t recurrent yeast infections on low dose  . Current glucose readings- prescribed FreeStyle Libre CGM, but has not started using yet . Cardiovascular risk reduction (recent dx HFrEF, EF 25-30%); follows w/ Dr. Garen Lah o Current hypertensive regimen: carvedilol 25 mg BID, spironolactone 25 mg daily, Entresto 97/103 mg BID  AM BP AM HR Weight (lbs) PM BP PM HR  1-Jun 115/81 81 225    2-Jun 125/88 76 223    3-Jun 123/77      4-Jun 133/86 90 222    5-Jun 112/79 88 222    6-Jun 126/72 82  133/98 72  7-Jun 136/96 78 220    8-Jun 133/81 83 222    9-Jun 127/81 79 222     o Current hyperlipidemia regimen: rosuvastatin 10 mg; LDL at goal <70 . Depression/anxiety: follows w/ Dr. Alford Highland improvement in depression on sertraline 100 mg daily; not using trazodone for sleep  Pharmacist Clinical Goal(s):  Marland Kitchen Over the next 90 days, patient with work with PharmD and primary care provider to address optimized glycemic management  Interventions: . Comprehensive medication review performed, medication list updated in electronic medical record . Inter-disciplinary care team collaboration (see longitudinal plan of care) . Scheduled face to face appointment in 2 weeks for patient to bring FreeStyle Libre CGM with her for me to teach her how to place and use her phone as reader. Will connect with LibreView so that readings can be reviewed remotely.  . Praised for documentation of daily BP and weight. Will forward note to cardiology. Appears some fluid weight loss w/ start of SGLT2  Patient Self Care Activities:  . Patient will check blood glucose BID, document, and provide at future  appointments . Patient will take medications as prescribed . Patient will report any questions or concerns to provider   Please see past updates related to this goal by clicking on the "Past Updates" button in the selected goal          Plan:  - Will meet face to face in 2 weeks  [name], PharmD, [place], [place] Pharmacist [place] [phone]

## 2019-06-17 ENCOUNTER — Ambulatory Visit (INDEPENDENT_AMBULATORY_CARE_PROVIDER_SITE_OTHER): Payer: Medicare Other | Admitting: Psychology

## 2019-06-17 DIAGNOSIS — F418 Other specified anxiety disorders: Secondary | ICD-10-CM | POA: Diagnosis not present

## 2019-06-20 ENCOUNTER — Other Ambulatory Visit: Payer: Self-pay

## 2019-06-24 ENCOUNTER — Ambulatory Visit: Payer: Medicare Other | Admitting: Pharmacist

## 2019-06-24 ENCOUNTER — Other Ambulatory Visit: Payer: Self-pay

## 2019-06-24 DIAGNOSIS — E1122 Type 2 diabetes mellitus with diabetic chronic kidney disease: Secondary | ICD-10-CM

## 2019-06-24 DIAGNOSIS — I10 Essential (primary) hypertension: Secondary | ICD-10-CM

## 2019-06-24 DIAGNOSIS — I5020 Unspecified systolic (congestive) heart failure: Secondary | ICD-10-CM

## 2019-06-24 DIAGNOSIS — N183 Chronic kidney disease, stage 3 unspecified: Secondary | ICD-10-CM

## 2019-06-24 DIAGNOSIS — E119 Type 2 diabetes mellitus without complications: Secondary | ICD-10-CM | POA: Diagnosis not present

## 2019-06-24 NOTE — Chronic Care Management (AMB) (Signed)
Chronic Care Management   Follow Up Note   06/24/2019 Name: Nancy Barajas MRN: [id number] DOB: 1960/08/18  Referred by: McLean-Scocuzza, Nino Glow, MD Reason for referral : Chronic Care Management (Medication Management)   Nancy Barajas is a 59 y.o. year old female who is a primary care patient of McLean-Scocuzza, Nino Glow, MD. The CCM team was consulted for assistance with chronic disease management and care coordination needs.    Review of patient status, including review of consultants reports, relevant laboratory and other test results, and collaboration with appropriate care team members and the patient's provider was performed as part of comprehensive patient evaluation and provision of chronic care management services.    SDOH (Social Determinants of Health) assessments performed: No See Care Plan activities for detailed interventions related to [facility])     Outpatient Encounter Medications as of 06/24/2019  Medication Sig Note  . blood glucose meter kit and supplies Dispense based on patient and insurance preference. Use up to four times daily as directed. (FOR ICD-10 E10.9, E11.9).   . carvedilol (COREG) 25 MG tablet Take 1 tablet (25 mg total) by mouth 2 (two) times daily.   . Cholecalciferol (VITAMIN D-3) 125 MCG (5000 UT) TABS Take 1 tablet by mouth daily. (Patient taking differently: Take 5,000 Units by mouth daily. )   . dapagliflozin propanediol (FARXIGA) 10 MG TABS tablet Take 1 tablet (10 mg total) by mouth daily before breakfast.   . Dulaglutide (TRULICITY) 3 AJ/6.8TL SOPN Inject 3 mg into the skin once a week.   . insulin lispro (HUMALOG KWIKPEN) 100 UNIT/ML KwikPen Check sugar 15-20 minutes before meals and give insulin based on sugar reading 70-130 0 units, 131-180 4 units, 181-240 8 units, 241-300 10 units, 301-350 12 units, 351-400 16 units, >400 20 units and call the doctor (Patient taking differently: Inject 4-16 Units into the skin See admin instructions. Check  sugar 15-20 minutes before meals and give insulin based on sugar reading 70-130 0 units, 131-180 4 units, 181-240 8 units, 241-300 10 units, 301-350 12 units, 351-400 16 units, >400 20 units and call the doctor)   . Insulin Pen Needle (PEN NEEDLES) 31G X 8 MM MISC 1 Device by Does not apply route in the morning, at noon, and at bedtime. Use for humalog   . Multiple Vitamins-Minerals (CENTRUM SILVER 50+WOMEN PO) Take 1 tablet by mouth daily.   . ReliOn Ultra Thin Lancets MISC 1 Device by Does not apply route 4 (four) times daily -  before meals and at bedtime. relion brand   . rosuvastatin (CRESTOR) 10 MG tablet Take 10 mg by mouth every evening.    . sacubitril-valsartan (ENTRESTO) 97-103 MG Take 1 tablet by mouth 2 (two) times daily.   . sertraline (ZOLOFT) 100 MG tablet Take 1 tablet (100 mg total) by mouth daily.   . sodium chloride (OCEAN) 0.65 % SOLN nasal spray Place 1 spray into both nostrils as needed for congestion.   Marland Kitchen spironolactone (ALDACTONE) 25 MG tablet Take 1 tablet (25 mg total) by mouth daily.   . traZODone (DESYREL) 100 MG tablet Take 1 tablet (100 mg total) by mouth at bedtime as needed for sleep. (Patient not taking: Reported on 06/13/2019) 05/20/2019: Patient reports taking only as needed; reports taking "about once a week or once every other week"   No facility-administered encounter medications on file as of 06/24/2019.     Objective:   Goals Addressed  This Visit's Progress     Patient Stated   .  "I want to be healthy (pt-stated)        CARE PLAN ENTRY (see longtitudinal plan of care for additional care plan information)  Current Barriers:  . Diabetes: uncontrolled; complicated by CHF, CKD; most recent A1c 8.6% (though overdue for A1c, f/u with PCP in ~ 2 weeks and will get labs) o Comes today for CGM application education . Current antihyperglycemic regimen: Trulicity 3 mg weekly; Farxiga 10 mg daily, Humalog sliding scale: 70-130 0 units, 131-180 4  units, 181-240 8 units, 241-300 10 units, 301-350 12 units, 351-400 16 units -> notes she has only used Humalog ~twice in the past two weeks o Previously on metformin, stopped d/t kidney function decline o Previously on Jardiance, stopped d/t recurrent yeast infections on low dose  . Current glucose readings  Fasting Pre Lunch After Lunch Pre Supper Bedtime  8-Jun 90 144  114   9-Jun 92 120  104 164  10-Jun 80 180 121 103 103  11-Jun 84  124 130   12-Jun 126  125    13-Jun 86  125  111  14-Jun 91 130 113 136   15-Jun 98  114 99   16-Jun 133 135 116 107   17-Jun 112  126 124   18-Jun 131 129 130 114   19-Jun 88 94 104 129   20-Jun 96 125 121    21-Jun 90      AVG 100 132 120 116 126   . Cardiovascular risk reduction (recent dx HFrEF, EF 25-30%); follows w/ Dr. Garen Lah o Current hypertensive regimen: carvedilol 25 mg BID, spironolactone 25 mg daily, Entresto 97/103 mg BID  Weight SBP DBP HR  8-Jun 222 133 81 83  9-Jun 222 127 81 79  10-Jun      11-Jun  124 87 87  12-Jun 222     13-Jun      14-Jun 222 136 96 92  15-Jun 222 119 75 92  16-Jun 223 123 82 89  17-Jun  120 68 82  18-Jun 222 126 91 80  19-Jun 220 133 89 80  20-Jun 220 119 79 75  21-Jun      AVG 222 126 83 84   o Current hyperlipidemia regimen: rosuvastatin 10 mg; LDL at goal <70 . Depression/anxiety: follows w/ Dr. Alford Highland improvement in depression on sertraline 100 mg daily; trazodone PRN sleep, but has not been using   Pharmacist Clinical Goal(s):  Marland Kitchen Over the next 90 days, patient with work with PharmD and primary care provider to address optimized glycemic management  Interventions: . Comprehensive medication review performed, medication list updated in electronic medical record . Inter-disciplinary care team collaboration (see longitudinal plan of care) . Demonstrated application technique for [employer] CGM. Patient placed first sensor. Counseled to scan at least 4 times daily to ensure full  download of readings. Patient to bring reader and usb cord to next appt w/ PCP for download.  . Reviewed BP/HR readings. Praised patient for continued documentation. BP well controlled, but patient still tachycardic. Consider maximization of carvedilol to 50 mg BID. Will message cardiology to see if they would like this change enacted now/at upcoming PCP visit.  Patient Self Care Activities:  . Patient will check blood glucose BID, document, and provide at future appointments . Patient will take medications as prescribed . Patient will report any questions or concerns to provider   Please see past updates related  to this goal by clicking on the "Past Updates" button in the selected goal          Plan:  - Scheduled f/u call in ~ 8 weeks  [name], PharmD, [place], [place] Pharmacist [place] [phone]

## 2019-06-24 NOTE — Patient Instructions (Signed)
Visit Information  Goals Addressed              This Visit's Progress     Patient Stated   .  "I want to be healthy (pt-stated)        CARE PLAN ENTRY (see longtitudinal plan of care for additional care plan information)  Current Barriers:  . Diabetes: uncontrolled; complicated by CHF, CKD; most recent A1c 8.6% (though overdue for A1c, f/u with PCP in ~ 2 weeks and will get labs) o Comes today for CGM application education . Current antihyperglycemic regimen: Trulicity 3 mg weekly; Farxiga 10 mg daily, Humalog sliding scale: 70-130 0 units, 131-180 4 units, 181-240 8 units, 241-300 10 units, 301-350 12 units, 351-400 16 units -> notes she has only used Humalog ~twice in the past two weeks o Previously on metformin, stopped d/t kidney function decline o Previously on Jardiance, stopped d/t recurrent yeast infections on low dose  . Current glucose readings  Fasting Pre Lunch After Lunch Pre Supper Bedtime  8-Jun 90 144  114   9-Jun 92 120  104 164  10-Jun 80 180 121 103 103  11-Jun 84  124 130   12-Jun 126  125    13-Jun 86  125  111  14-Jun 91 130 113 136   15-Jun 98  114 99   16-Jun 133 135 116 107   17-Jun 112  126 124   18-Jun 131 129 130 114   19-Jun 88 94 104 129   20-Jun 96 125 121    21-Jun 90      AVG 100 132 120 116 126   . Cardiovascular risk reduction (recent dx HFrEF, EF 25-30%); follows w/ Dr. Garen Lah o Current hypertensive regimen: carvedilol 25 mg BID, spironolactone 25 mg daily, Entresto 97/103 mg BID  Weight SBP DBP HR  8-Jun 222 133 81 83  9-Jun 222 127 81 79  10-Jun      11-Jun  124 87 87  12-Jun 222     13-Jun      14-Jun 222 136 96 92  15-Jun 222 119 75 92  16-Jun 223 123 82 89  17-Jun  120 68 82  18-Jun 222 126 91 80  19-Jun 220 133 89 80  20-Jun 220 119 79 75  21-Jun      AVG 222 126 83 84   o Current hyperlipidemia regimen: rosuvastatin 10 mg; LDL at goal <70 . Depression/anxiety: follows w/ Dr. Alford Highland improvement in  depression on sertraline 100 mg daily; trazodone PRN sleep, but has not been using   Pharmacist Clinical Goal(s):  Marland Kitchen Over the next 90 days, patient with work with PharmD and primary care provider to address optimized glycemic management  Interventions: . Comprehensive medication review performed, medication list updated in electronic medical record . Inter-disciplinary care team collaboration (see longitudinal plan of care) . Demonstrated application technique for FreeStyle Libre CGM. Patient placed first sensor. Counseled to scan at least 4 times daily to ensure full download of readings. Patient to bring reader and usb cord to next appt w/ PCP for download.  . Reviewed BP/HR readings. Praised patient for continued documentation. BP well controlled, but patient still tachycardic. Consider maximization of carvedilol to 50 mg BID. Will message cardiology to see if they would like this change enacted now/at upcoming PCP visit.  Patient Self Care Activities:  . Patient will check blood glucose BID, document, and provide at future appointments . Patient will take medications as prescribed .  Patient will report any questions or concerns to provider   Please see past updates related to this goal by clicking on the "Past Updates" button in the selected goal         The patient verbalized understanding of instructions provided today and declined a print copy of patient instruction materials.   Plan:  - Scheduled f/u call in ~ 8 weeks  [name], PharmD, [place], [place] Pharmacist [place] [phone]

## 2019-06-25 ENCOUNTER — Encounter: Payer: Self-pay | Admitting: *Deleted

## 2019-06-25 ENCOUNTER — Other Ambulatory Visit: Payer: Self-pay | Admitting: *Deleted

## 2019-06-25 NOTE — Patient Outreach (Signed)
[facility]) Care Management [place] Telephone Outreach  06/25/2019  Nancy Barajas 1960/09/23 [id number]  Successful telephone outreach toKatherine Tiffany Barajas, 59 y/o female referred to [facility] RN CM on April 25, 2019 by [facility] CMA for routine PCP referral for DM management and new diagnosis of CHF. Patient has had no recent unplanned hospitalizations. Patient has history including, but not limited to, HTN/ HLD; DM- typee II with most recent A1-C of 8.6 (8.6- Feb 20, 2019); CKD- III; and anxiety/ depression.  HIPAA/ identity verified; patient reports she has "been doing real good;" and she sounds to be in no distress throughout call today.  Reports attended yesterday's office visit with [facility] Pharmacist and obtained her CGM and education around how to apply/ use; reports that as soon as she left the office and got outside in the heat and humidity, "it fell off;" verbalizes plans to re-apply today but has not yet done so; encouraged patient to contact [employer] to obtain additional pointers in applying monitor and patient confirms that she has the phone number as listed on the box; she is able to accurately verbalize the basics of applying to her skin and I encouraged her to call the company for additional pointers prior to re-applying, as she reports she "always sweats a lot" in the hot weather; encouraged her to apply when her skin is completely dry.  She confirms that she has been using her glucometer since the CGM fell off yesterday.  States she will contact company after we hang up.  Patient further reports:  -- no medication issues/ concerns: confirmed reviewed with [facility] Pharmacist yesterday -- attended cardiology provider office visit 05/27/19; visit reviewed with patient who verbalizes accurate understanding of general plan of care -- upcoming scheduled PCP appointment July 10, 2019- expecting updated A1-C to be drawn  Self-health management ofchronic disease states of DM  and CHF: --continues to monitor/ record blood sugars at home 2-4 times per day; reports no significant low/ high readings; blood sugars reviewed with patient as provided by [facility] Pharmacist yesterday-- positive reinforcement provided for noticeable lower blood sugars than previous outreaches ---- has intermittently continued food journaling; reports helpful in interpreting values at home/ knowing what specific foods increase/ decrease blood sugar readings ---- using teach back method, reviewed previously provided education around significance of A1-C values along with specific patient trends over last year, as patient reports today that she is unable to find previously mailed printed educational material- will re-send ---- continues "trying" to "eat the right foods;" reports follows low carb/ low sugar diet- discussed value of reading nutritional labels, and discussed general guidelines for daily carb intake/ meal and portion control  -- continues daily weight monitoring at home, as we discussed at time of last outreach; all weights from home reviewed with patient as provided during [facility] pharmacist office visit yesterday ---- weights at home consistently between 220-223, without any overnight gains > 3 lbs and no weekly gains > 5 lbs ---- using teach back method, confirmed that patient is able to verbalize weight gain guidelines in setting of CHF along with corresponding action plan for same with minimal prompting; confirmed that she has no signs/ symptoms CHF yellow zone outside of baseline   Confirmed with patient that she has not been able to locate previously mailed printed educational material around self-health management of chronic disease states of DM and CHF; will re-send  Patient denies further issues, concerns, or problems today. Iconfirmed that patient hasmy direct phone number, the main [facility]  CM office phone number, and the [facility] CM 24-hour nurse advice phone number should issues arise prior  to next scheduled [name] outreach next month, post-upcoming scheduled PCP appointment. Discussed possibility of transfer to [place] at time of next outreach; patient is agreeable. Encouraged patient to contact me directly if needs, questions, issues, or concerns arise prior to next scheduled outreach; patient agreed to do so.  Plan:  Patient will take medications as prescribed and will attend all scheduled provider appointments  Patient will promptly notify care providers for any new concerns/ issues/ problems that arise  Patient willcontinue monitoring and recording blood sugars at home 2-4 times per day and will continue journaling food intake  Patient willcontinuemonitoring/ recording daily weights at home  I will re-send previously mailed printed educationalmaterial and patient will review  [name] outreach to continue with scheduled phone call nextmonth, possible for transfer to [place] [facility]     Most Recent Value  Care Plan Problem [facility] Self- health management of chronic disease states of DM and CHF, as evidenced by patient reporting  Role Documenting the Problem [facility] Care Management Coordinator  Care Plan for Problem [facility] Active  [facility] Long Term Goal  Over the next 60 days, patient will continue monitoring and recording blood sugars 2-4 times per day, as evidenced by patient reporting/ review of same during [facility] RN CM outreach  [Goal extended/ re-established today]  [facility] Long Term Goal Start Date 06/25/19  [Goal re-established today]  Interventions for Problem [facility] Long Term Goal Reviewed with patient today recent blood sugars at home and office visit to [facility] Pharmacist yesterday,  confirmed that patient obtained CGM during office visit yesterday,  discussed with patienty strategies to try to ensure adherence of CGM to her skin during hot humid weather,  encouraged her to contact company for additional pointers and confirmed that she  has the phone number for the company,  confirmed that patient continues to monitor blood sugars using glucometer when CGM is not in place,  positive reinforcement provided for patient consistently monitoring and recording blood sugars at home and for the noticeable decrease in blood sugar readings over last months  [name] Short Term Goal #1  Over the next 30 days, patient will continue daily monitoring and recording of weights at home, as evidenced by patient reporting/ review of same during [facility] RN CM outreach  [facility] CM Short Term Goal #1 Start Date 05/20/19  [facility] CM Short Term Goal #1 Met Date 06/25/19  [Goal met]  Interventions for Short Term Goal #1 Confirmed that patient has continued to monitor/ record daily weights at home,  positive reinforcement provided,  using teach back method, reiterated previously provided education around weight gain guidelines in setting of CHF along with corresponding action plan,  confirmed that patient attended recent cardiology provider appointment and reviewed visit with her  [facility] CM Short Term Goal #2  Over the next 18 days, patient will review printed educational material mailed to her around self-health management of chronic disease states of CHF and DM, as evidenced by review of same with patient during [facility] RN CM outreach  [facility] CM Short Term Goal #2 Start Date 06/25/19  Interventions for Short Term Goal #2 Discussed with patient her report that she did not receive/ can not find previously mailed printed educational material,  reiterated previously provided education during telephone outreaches and re-sent patient educational material,  encouraged patient to promptly review  once she receives and to write down any questions she has  [name] Short Term Goal #3 Over the next 18 days, patient will independently verbalize new A-1C result post upcoming scheduled PCP office visit, as evidenced by patient reporting during [facility] CM outreach  [facility] CM Short Term Goal #3 Start Date  06/25/19  Interventions for Short Tern Goal #3 Again discussed with patient previously provided education around A1-C values,  confirmed that patient is aware of and plans to attend upcoming scheduled PCP office visit for updated A1-C value,  discussed how blood sugars at home correlate and contribute to A1-C average over time,       Oneta Rack, RN, BSN, [employer] Coordinator [facility] Care Management  [phone]

## 2019-07-03 ENCOUNTER — Ambulatory Visit (INDEPENDENT_AMBULATORY_CARE_PROVIDER_SITE_OTHER): Payer: Medicare Other | Admitting: Psychology

## 2019-07-03 DIAGNOSIS — F418 Other specified anxiety disorders: Secondary | ICD-10-CM | POA: Diagnosis not present

## 2019-07-10 ENCOUNTER — Other Ambulatory Visit: Payer: Self-pay

## 2019-07-10 ENCOUNTER — Ambulatory Visit (INDEPENDENT_AMBULATORY_CARE_PROVIDER_SITE_OTHER): Payer: Medicare Other | Admitting: Internal Medicine

## 2019-07-10 ENCOUNTER — Encounter: Payer: Self-pay | Admitting: Internal Medicine

## 2019-07-10 VITALS — BP 112/68 | HR 86 | Temp 98.2°F | Ht 71.0 in | Wt 220.4 lb

## 2019-07-10 DIAGNOSIS — E041 Nontoxic single thyroid nodule: Secondary | ICD-10-CM

## 2019-07-10 DIAGNOSIS — Z114 Encounter for screening for human immunodeficiency virus [HIV]: Secondary | ICD-10-CM

## 2019-07-10 DIAGNOSIS — E1159 Type 2 diabetes mellitus with other circulatory complications: Secondary | ICD-10-CM | POA: Diagnosis not present

## 2019-07-10 DIAGNOSIS — R319 Hematuria, unspecified: Secondary | ICD-10-CM

## 2019-07-10 DIAGNOSIS — E669 Obesity, unspecified: Secondary | ICD-10-CM

## 2019-07-10 DIAGNOSIS — I10 Essential (primary) hypertension: Secondary | ICD-10-CM

## 2019-07-10 DIAGNOSIS — N139 Obstructive and reflux uropathy, unspecified: Secondary | ICD-10-CM

## 2019-07-10 DIAGNOSIS — N281 Cyst of kidney, acquired: Secondary | ICD-10-CM | POA: Diagnosis not present

## 2019-07-10 DIAGNOSIS — K769 Liver disease, unspecified: Secondary | ICD-10-CM

## 2019-07-10 DIAGNOSIS — Z113 Encounter for screening for infections with a predominantly sexual mode of transmission: Secondary | ICD-10-CM

## 2019-07-10 DIAGNOSIS — S91301A Unspecified open wound, right foot, initial encounter: Secondary | ICD-10-CM

## 2019-07-10 DIAGNOSIS — R935 Abnormal findings on diagnostic imaging of other abdominal regions, including retroperitoneum: Secondary | ICD-10-CM

## 2019-07-10 DIAGNOSIS — E559 Vitamin D deficiency, unspecified: Secondary | ICD-10-CM | POA: Diagnosis not present

## 2019-07-10 DIAGNOSIS — R195 Other fecal abnormalities: Secondary | ICD-10-CM

## 2019-07-10 DIAGNOSIS — N200 Calculus of kidney: Secondary | ICD-10-CM

## 2019-07-10 DIAGNOSIS — Z23 Encounter for immunization: Secondary | ICD-10-CM

## 2019-07-10 DIAGNOSIS — D1803 Hemangioma of intra-abdominal structures: Secondary | ICD-10-CM

## 2019-07-10 LAB — VITAMIN D 25 HYDROXY (VIT D DEFICIENCY, FRACTURES): VITD: 52.82 ng/mL (ref 30.00–100.00)

## 2019-07-10 LAB — HEMOGLOBIN A1C: Hgb A1c MFr Bld: 6.6 % — ABNORMAL HIGH (ref 4.6–6.5)

## 2019-07-10 MED ORDER — MUPIROCIN 2 % EX OINT: 1.0000 "application " | TOPICAL_OINTMENT | Freq: Three times a day (TID) | CUTANEOUS | 0 refills | Status: DC

## 2019-07-10 NOTE — Progress Notes (Signed)
Patient flagged: Current status:  PATIENT IS OVERDUE FOR BMI FOLLOW UP PLAN BMI is estimated to be 30.7 based on the last recorded weight and height Current status:  OVERDUE FOR COLORECTAL CANCER SCREENING

## 2019-07-10 NOTE — Progress Notes (Signed)
Chief Complaint  Patient presents with  . Follow-up   F/u  1. DM 2 with HTN on trulicity 3 weekly, farxiga 10 mg, prn humalog using 4-8 units 1-2 x per day if needed  2. HTN controlled on coreg 25 mg bid, entresto 97-103, spironlactone 25 mg  Reviewed both home BP readings and glucose readings which overall wnl   3. Anxiety/depression mood doing better seeing osman 07/2819 and zoloft 100 mg qd is helping and on trazadone 100 mg qhs  4. Thyroid nodules 2.8 cm and 0.12 cm left will f/u with US abdomen she does c/o hoarse voice and pulm does not think related could be related to thyroid  5. Liver and kidney cysts pt agreeable to US abdomen to further w/u    Review of Systems  Constitutional: Negative for weight loss.  HENT: Negative for hearing loss.   Eyes: Negative for blurred vision.  Respiratory: Negative for shortness of breath.   Cardiovascular: Negative for chest pain.  Gastrointestinal: Negative for abdominal pain.  Musculoskeletal: Negative for falls.  Skin:       Right foot sore shes picked   Psychiatric/Behavioral: Negative for depression. The patient is not nervous/anxious.    Past Medical History:  Diagnosis Date  . Asthma   . Diabetes mellitus without complication ([place])   . Hypertension   . Kidney stones   . Vertigo    when laying flat  . Vocal cord dysfunction    makes it hard for patient to talk and breathe   Past Surgical History:  Procedure Laterality Date  . LITHOTRIPSY    . RIGHT/LEFT HEART CATH AND CORONARY ANGIOGRAPHY Bilateral 04/08/2019   Procedure: RIGHT/LEFT HEART CATH AND CORONARY ANGIOGRAPHY;  Surgeon: Wellington Hampshire, MD;  Location: [facility];  Service: Cardiovascular;  Laterality: Bilateral;   Family History  Problem Relation Age of Onset  . Hypertension Mother   . Diabetes Mother   . Hypertension Father   . Diabetes Father    Social History   Socioeconomic History  . Marital status: Divorced    Spouse name: Not on file  .  Number of children: 2  . Years of education: Not on file  . Highest education level: 9th grade  Occupational History  . Not on file  Tobacco Use  . Smoking status: Former Smoker    Packs/day: 1.00    Years: 10.00    Pack years: 10.00    Quit date: 01/03/1998    Years since quitting: 21.5  . Smokeless tobacco: Never Used  Vaping Use  . Vaping Use: Never used  Substance and Sexual Activity  . Alcohol use: Never  . Drug use: Never  . Sexual activity: Not Currently  Other Topics Concern  . Not on file  Social History Narrative   Lives at home    2 daughters as of 04/04/19 75 y.o daughter lives with her (she works)   Patient is on disability.   Social Determinants of Health   Financial Resource Strain: High Risk  . Difficulty of Paying Living Expenses: Hard  Food Insecurity: No Food Insecurity  . Worried About Charity fundraiser in the Last Year: Never true  . Ran Out of Food in the Last Year: Never true  Transportation Needs: No Transportation Needs  . Lack of Transportation (Medical): No  . Lack of Transportation (Non-Medical): No  Physical Activity: Unknown  . Days of Exercise per Week: 0 days  . Minutes of Exercise per Session: Not  on file  Stress: No Stress Concern Present  . Feeling of Stress : Only a little  Social Connections: Unknown  . Frequency of Communication with Friends and Family: Three times a week  . Frequency of Social Gatherings with Friends and Family: More than three times a week  . Attends Religious Services: Never  . Active Member of Clubs or Organizations: Yes  . Attends Archivist Meetings: Never  . Marital Status: Not on file  Intimate Partner Violence: Not At Risk  . Fear of Current or Ex-Partner: No  . Emotionally Abused: No  . Physically Abused: No  . Sexually Abused: No   Current Meds  Medication Sig  . blood glucose meter kit and supplies Dispense based on patient and insurance preference. Use up to four times daily as  directed. (FOR ICD-10 E10.9, E11.9).  . carvedilol (COREG) 25 MG tablet Take 1 tablet (25 mg total) by mouth 2 (two) times daily.  . Cholecalciferol (VITAMIN D-3) 125 MCG (5000 UT) TABS Take 1 tablet by mouth daily. (Patient taking differently: Take 5,000 Units by mouth daily. )  . dapagliflozin propanediol (FARXIGA) 10 MG TABS tablet Take 1 tablet (10 mg total) by mouth daily before breakfast.  . Dulaglutide (TRULICITY) 3 JS/2.8BT SOPN Inject 3 mg into the skin once a week.  . insulin lispro (HUMALOG KWIKPEN) 100 UNIT/ML KwikPen Check sugar 15-20 minutes before meals and give insulin based on sugar reading 70-130 0 units, 131-180 4 units, 181-240 8 units, 241-300 10 units, 301-350 12 units, 351-400 16 units, >400 20 units and call the doctor (Patient taking differently: Inject 4-16 Units into the skin See admin instructions. Check sugar 15-20 minutes before meals and give insulin based on sugar reading 70-130 0 units, 131-180 4 units, 181-240 8 units, 241-300 10 units, 301-350 12 units, 351-400 16 units, >400 20 units and call the doctor)  . Insulin Pen Needle (PEN NEEDLES) 31G X 8 MM MISC 1 Device by Does not apply route in the morning, at noon, and at bedtime. Use for humalog  . Multiple Vitamins-Minerals (CENTRUM SILVER 50+WOMEN PO) Take 1 tablet by mouth daily.  . ReliOn Ultra Thin Lancets MISC 1 Device by Does not apply route 4 (four) times daily -  before meals and at bedtime. relion brand  . rosuvastatin (CRESTOR) 10 MG tablet Take 10 mg by mouth every evening.   . sacubitril-valsartan (ENTRESTO) 97-103 MG Take 1 tablet by mouth 2 (two) times daily.  . sertraline (ZOLOFT) 100 MG tablet Take 1 tablet (100 mg total) by mouth daily.  . sodium chloride (OCEAN) 0.65 % SOLN nasal spray Place 1 spray into both nostrils as needed for congestion.  Marland Kitchen spironolactone (ALDACTONE) 25 MG tablet Take 1 tablet (25 mg total) by mouth daily.   Allergies  Allergen Reactions  . Penicillins Rash    Did it  involve swelling of the face/tongue/throat, SOB, or low BP? No Did it involve sudden or severe rash/hives, skin peeling, or any reaction on the inside of your mouth or nose? No Did you need to seek medical attention at a hospital or doctor's office? No When did it last happen?Childhood If all above answers are "NO", may proceed with cephalosporin use.   Recent Results (from the past 2160 hour(s))  Basic metabolic panel     Status: Abnormal   Collection Time: 05/14/19 10:00 AM  Result Value Ref Range   Sodium 138 135 - 145 mmol/L   Potassium 4.6 3.5 - 5.1 mmol/L  Chloride 102 98 - 111 mmol/L   CO2 26 22 - 32 mmol/L   Glucose, Bld 225 (H) 70 - 99 mg/dL    Comment: Glucose reference range applies only to samples taken after fasting for at least 8 hours.   BUN 27 (H) 6 - 20 mg/dL   Creatinine, Ser 1.21 (H) 0.44 - 1.00 mg/dL   Calcium 9.0 8.9 - 10.3 mg/dL   GFR calc non Af Amer 49 (L) >60 mL/min   GFR calc Af Amer 57 (L) >60 mL/min   Anion gap 10 5 - 15    Comment: Performed at [facility], [address] [place],  [address]  Hemoglobin A1c     Status: Abnormal   Collection Time: 07/10/19  9:46 AM  Result Value Ref Range   Hgb A1c MFr Bld 6.6 (H) 4.6 - 6.5 %    Comment: Glycemic Control Guidelines for People with Diabetes:Non Diabetic:  <6%Goal of Therapy: <7%Additional Action Suggested:  >8%   Urinalysis, Routine w reflex microscopic     Status: Abnormal   Collection Time: 07/10/19  9:46 AM  Result Value Ref Range   Color, Urine YELLOW YELLOW   APPearance CLOUDY (A) CLEAR   Specific Gravity, Urine 1.026 1.001 - 1.03   pH < OR = 5.0 5.0 - 8.0   Glucose, UA 3+ (A) NEGATIVE   Bilirubin Urine NEGATIVE NEGATIVE   Ketones, ur NEGATIVE NEGATIVE   Hgb urine dipstick 3+ (A) NEGATIVE   Protein, ur 2+ (A) NEGATIVE   Nitrite NEGATIVE NEGATIVE   Leukocytes,Ua NEGATIVE NEGATIVE   WBC, UA 0-5 0 - 5 /HPF   RBC / HPF 10-20 (A) 0 - 2 /HPF   Squamous Epithelial / LPF  10-20 (A) < OR = 5 /HPF   Bacteria, UA FEW (A) NONE SEEN /HPF   AMORPHOUS SEDIMENT FEW NONE OR FE /HPF   Hyaline Cast 1-3 (A) NONE SEEN /LPF  VITAMIN D 25 Hydroxy (Vit-D Deficiency, Fractures)     Status: None   Collection Time: 07/10/19  9:46 AM  Result Value Ref Range   VITD 52.82 30.00 - 100.00 ng/mL  HIV antibody (with reflex)     Status: None   Collection Time: 07/10/19 10:14 AM  Result Value Ref Range   HIV 1&2 Ab, 4th Generation NON-REACTIVE NON-REACTI    Comment: HIV-1 antigen and HIV-1/HIV-2 antibodies were not detected. There is no laboratory evidence of HIV infection. Marland Kitchen PLEASE NOTE: This information has been disclosed to you from records whose confidentiality may be protected by state law.  If your state requires such protection, then the state law prohibits you from making any further disclosure of the information without the specific written consent of the person to whom it pertains, or as otherwise permitted by law. A general authorization for the release of medical or other information is NOT sufficient for this purpose. . For additional information please refer to http://education.questdiagnostics.com/faq/FAQ106 (This link is being provided for informational/ educational purposes only.) . Marland Kitchen The performance of this assay has not been clinically validated in patients less than 19 years old. .    Objective  Body mass index is 30.74 kg/m. Wt Readings from Last 3 Encounters:  07/10/19 220 lb 6.4 oz (100 kg)  05/27/19 230 lb (104.3 kg)  04/29/19 230 lb (104.3 kg)   Temp Readings from Last 3 Encounters:  07/10/19 98.2 F (36.8 C) (Oral)  04/08/19 98.2 F (36.8 C) (Oral)  04/04/19 (!) 96.7 F (35.9 C) (Temporal)  BP Readings from Last 3 Encounters:  07/10/19 112/68  05/27/19 136/88  04/29/19 106/60   Pulse Readings from Last 3 Encounters:  07/10/19 86  05/27/19 92  04/29/19 91    Physical Exam Vitals and nursing note reviewed.  Constitutional:       Appearance: Normal appearance. She is well-developed and well-groomed. She is obese.  HENT:     Head: Normocephalic and atraumatic.  Eyes:     Conjunctiva/sclera: Conjunctivae normal.     Pupils: Pupils are equal, round, and reactive to light.  Cardiovascular:     Rate and Rhythm: Normal rate and regular rhythm.     Heart sounds: Normal heart sounds. No murmur heard.   Pulmonary:     Effort: Pulmonary effort is normal.     Breath sounds: Normal breath sounds.  Skin:    General: Skin is warm and dry.  Neurological:     General: No focal deficit present.     Mental Status: She is alert and oriented to person, place, and time. Mental status is at baseline.     Gait: Gait normal.  Psychiatric:        Attention and Perception: Attention and perception normal.        Mood and Affect: Mood and affect normal.        Speech: Speech normal.        Behavior: Behavior normal. Behavior is cooperative.        Thought Content: Thought content normal.        Cognition and Memory: Cognition and memory normal.        Judgment: Judgment normal.     Assessment  Plan  Hypertension associated with diabetes ([place]) - Plan: Hemoglobin A1c8.6 to 6.6 improved  Cont meds for BP and DM2   Hematuria, unspecified type - Plan: Urinalysis, Routine w reflex microscopic, Urine Culture  Vitamin D deficiency - Plan: VITAMIN D 25 Hydroxy (Vit-D Deficiency, Fractures)  Kidney cysts - Plan: US Abdomen Complete Liver lesion - Plan: US Abdomen Complete  Thyroid nodule - Plan: US THYROID  Open wound of right foot, initial encounter - Plan: mupirocin ointment (BACTROBAN) 2 %  Obesity (BMI 30.0-34.9)  Cont healthy diet and exercise  HM Flu utd Tdap  shingrix  pna 23 vaccine given today covid 2/2 had utd   mammo ordered call to schedule  Pap 02/05/2018 negative  Colonoscopy never had due to anxiety will do cologaurd ordered today   Provider: Dr. Olivia Mackie McLean-Scocuzza-[facility]

## 2019-07-10 NOTE — Patient Instructions (Addendum)
Goal blood pressure <130/<80  Goal blood sugar 90-130 before food in the am and <180 after meals 2 hours  Consider shingrix and Tdap vaccine   mammo due call to schedule   Pneumococcal Polysaccharide Vaccine (PPSV23): What You Need to Know 1. Why get vaccinated? Pneumococcal polysaccharide vaccine (PPSV23) can prevent pneumococcal disease. Pneumococcal disease refers to any illness caused by pneumococcal bacteria. These bacteria can cause many types of illnesses, including pneumonia, which is an infection of the lungs. Pneumococcal bacteria are one of the most common causes of pneumonia. Besides pneumonia, pneumococcal bacteria can also cause:  Ear infections  Sinus infections  Meningitis (infection of the tissue covering the brain and spinal cord)  Bacteremia (bloodstream infection) Anyone can get pneumococcal disease, but children under 20 years of age, people with certain medical conditions, adults 78 years or older, and cigarette smokers are at the highest risk. Most pneumococcal infections are mild. However, some can result in long-term problems, such as brain damage or hearing loss. Meningitis, bacteremia, and pneumonia caused by pneumococcal disease can be fatal. 2. PPSV23 PPSV23 protects against 23 types of bacteria that cause pneumococcal disease. PPSV23 is recommended for:  All adults 21 years or older,  Anyone 2 years or older with certain medical conditions that can lead to an increased risk for pneumococcal disease. Most people need only one dose of PPSV23. A second dose of PPSV23, and another type of pneumococcal vaccine called PCV13, are recommended for certain high-risk groups. Your health care provider can give you more information. People 65 years or older should get a dose of PPSV23 even if they have already gotten one or more doses of the vaccine before they turned 30. 3. Talk with your health care provider Tell your vaccine provider if the person getting the  vaccine:  Has had an allergic reaction after a previous dose of PPSV23, or has any severe, life-threatening allergies. In some cases, your health care provider may decide to postpone PPSV23 vaccination to a future visit. People with minor illnesses, such as a cold, may be vaccinated. People who are moderately or severely ill should usually wait until they recover before getting PPSV23. Your health care provider can give you more information. 4. Risks of a vaccine reaction  Redness or pain where the shot is given, feeling tired, fever, or muscle aches can happen after PPSV23. People sometimes faint after medical procedures, including vaccination. Tell your provider if you feel dizzy or have vision changes or ringing in the ears. As with any medicine, there is a very remote chance of a vaccine causing a severe allergic reaction, other serious injury, or death. 5. What if there is a serious problem? An allergic reaction could occur after the vaccinated person leaves the clinic. If you see signs of a severe allergic reaction (hives, swelling of the face and throat, difficulty breathing, a fast heartbeat, dizziness, or weakness), call 9-1-1 and get the person to the nearest hospital. For other signs that concern you, call your health care provider. Adverse reactions should be reported to the Vaccine Adverse Event Reporting System (VAERS). Your health care provider will usually file this report, or you can do it yourself. Visit the VAERS website at www.vaers.SamedayNews.es or call 564-852-2530. VAERS is only for reporting reactions, and VAERS staff do not give medical advice. 6. How can I learn more?  Ask your health care provider.  Call your local or state health department.  Contact the Centers for Disease Control and Prevention (CDC): ? Call  225-459-0327 (1-800-CDC-INFO) or ? Visit CDC's website at http://hunter.com/ CDC Vaccine Information Statement PPSV23 Vaccine (11/01/2017) This information  is not intended to replace advice given to you by your health care provider. Make sure you discuss any questions you have with your health care provider. Document Revised: 04/10/2018 Document Reviewed: 08/01/2017 Elsevier Patient Education  [place].  Diabetes Mellitus and Nutrition, Adult When you have diabetes (diabetes mellitus), it is very important to have healthy eating habits because your blood sugar (glucose) levels are greatly affected by what you eat and drink. Eating healthy foods in the appropriate amounts, at about the same times every day, can help you:  Control your blood glucose.  Lower your risk of heart disease.  Improve your blood pressure.  Reach or maintain a healthy weight. Every person with diabetes is different, and each person has different needs for a meal plan. Your health care provider may recommend that you work with a diet and nutrition specialist (dietitian) to make a meal plan that is best for you. Your meal plan may vary depending on factors such as:  The calories you need.  The medicines you take.  Your weight.  Your blood glucose, blood pressure, and cholesterol levels.  Your activity level.  Other health conditions you have, such as heart or kidney disease. How do carbohydrates affect me? Carbohydrates, also called carbs, affect your blood glucose level more than any other type of food. Eating carbs naturally raises the amount of glucose in your blood. Carb counting is a method for keeping track of how many carbs you eat. Counting carbs is important to keep your blood glucose at a healthy level, especially if you use insulin or take certain oral diabetes medicines. It is important to know how many carbs you can safely have in each meal. This is different for every person. Your dietitian can help you calculate how many carbs you should have at each meal and for each snack. Foods that contain carbs include:  Bread, cereal, rice, pasta, and  crackers.  Potatoes and corn.  Peas, beans, and lentils.  Milk and yogurt.  Fruit and juice.  Desserts, such as cakes, cookies, ice cream, and candy. How does alcohol affect me? Alcohol can cause a sudden decrease in blood glucose (hypoglycemia), especially if you use insulin or take certain oral diabetes medicines. Hypoglycemia can be a life-threatening condition. Symptoms of hypoglycemia (sleepiness, dizziness, and confusion) are similar to symptoms of having too much alcohol. If your health care provider says that alcohol is safe for you, follow these guidelines:  Limit alcohol intake to no more than 1 drink per day for nonpregnant women and 2 drinks per day for men. One drink equals 12 oz of beer, 5 oz of wine, or 1 oz of hard liquor.  Do not drink on an empty stomach.  Keep yourself hydrated with water, diet soda, or unsweetened iced tea.  Keep in mind that regular soda, juice, and other mixers may contain a lot of sugar and must be counted as carbs. What are tips for following this plan?  Reading food labels  Start by checking the serving size on the "Nutrition Facts" label of packaged foods and drinks. The amount of calories, carbs, fats, and other nutrients listed on the label is based on one serving of the item. Many items contain more than one serving per package.  Check the total grams (g) of carbs in one serving. You can calculate the number of servings of carbs in  one serving by dividing the total carbs by 15. For example, if a food has 30 g of total carbs, it would be equal to 2 servings of carbs.  Check the number of grams (g) of saturated and trans fats in one serving. Choose foods that have low or no amount of these fats.  Check the number of milligrams (mg) of salt (sodium) in one serving. Most people should limit total sodium intake to less than 2,300 mg per day.  Always check the nutrition information of foods labeled as "low-fat" or "nonfat". These foods may be  higher in added sugar or refined carbs and should be avoided.  Talk to your dietitian to identify your daily goals for nutrients listed on the label. Shopping  Avoid buying canned, premade, or processed foods. These foods tend to be high in fat, sodium, and added sugar.  Shop around the outside edge of the grocery store. This includes fresh fruits and vegetables, bulk grains, fresh meats, and fresh dairy. Cooking  Use low-heat cooking methods, such as baking, instead of high-heat cooking methods like deep frying.  Cook using healthy oils, such as olive, canola, or sunflower oil.  Avoid cooking with butter, cream, or high-fat meats. Meal planning  Eat meals and snacks regularly, preferably at the same times every day. Avoid going long periods of time without eating.  Eat foods high in fiber, such as fresh fruits, vegetables, beans, and whole grains. Talk to your dietitian about how many servings of carbs you can eat at each meal.  Eat 4-6 ounces (oz) of lean protein each day, such as lean meat, chicken, fish, eggs, or tofu. One oz of lean protein is equal to: ? 1 oz of meat, chicken, or fish. ? 1 egg. ?  cup of tofu.  Eat some foods each day that contain healthy fats, such as avocado, nuts, seeds, and fish. Lifestyle  Check your blood glucose regularly.  Exercise regularly as told by your health care provider. This may include: ? 150 minutes of moderate-intensity or vigorous-intensity exercise each week. This could be brisk walking, biking, or water aerobics. ? Stretching and doing strength exercises, such as yoga or weightlifting, at least 2 times a week.  Take medicines as told by your health care provider.  Do not use any products that contain nicotine or tobacco, such as cigarettes and e-cigarettes. If you need help quitting, ask your health care provider.  Work with a Social worker or diabetes educator to identify strategies to manage stress and any emotional and social  challenges. Questions to ask a health care provider  Do I need to meet with a diabetes educator?  Do I need to meet with a dietitian?  What number can I call if I have questions?  When are the best times to check my blood glucose? Where to find more information:  American Diabetes Association: diabetes.org  Academy of Nutrition and Dietetics: www.eatright.[employer] of Diabetes and Digestive and Kidney Diseases (NIH): [employer] Summary  A healthy meal plan will help you control your blood glucose and maintain a healthy lifestyle.  Working with a diet and nutrition specialist (dietitian) can help you make a meal plan that is best for you.  Keep in mind that carbohydrates (carbs) and alcohol have immediate effects on your blood glucose levels. It is important to count carbs and to use alcohol carefully. This information is not intended to replace advice given to you by your health care provider. Make sure you discuss any  questions you have with your health care provider. Document Revised: 12/02/2016 Document Reviewed: 01/25/2016 Elsevier Patient Education  2020 [employer].

## 2019-07-11 DIAGNOSIS — E1159 Type 2 diabetes mellitus with other circulatory complications: Secondary | ICD-10-CM | POA: Insufficient documentation

## 2019-07-11 DIAGNOSIS — E669 Obesity, unspecified: Secondary | ICD-10-CM | POA: Insufficient documentation

## 2019-07-11 DIAGNOSIS — K449 Diaphragmatic hernia without obstruction or gangrene: Secondary | ICD-10-CM | POA: Insufficient documentation

## 2019-07-11 DIAGNOSIS — E559 Vitamin D deficiency, unspecified: Secondary | ICD-10-CM | POA: Insufficient documentation

## 2019-07-11 LAB — URINE CULTURE
MICRO NUMBER:: 10675500
Result:: NO GROWTH
SPECIMEN QUALITY:: ADEQUATE

## 2019-07-11 LAB — URINALYSIS, ROUTINE W REFLEX MICROSCOPIC
Bilirubin Urine: NEGATIVE
Ketones, ur: NEGATIVE
Leukocytes,Ua: NEGATIVE
Nitrite: NEGATIVE
Specific Gravity, Urine: 1.026 (ref 1.001–1.03)
pH: <=5 (ref 5.0–8.0)

## 2019-07-11 LAB — HIV ANTIBODY (ROUTINE TESTING W REFLEX): HIV 1&2 Ab, 4th Generation: NONREACTIVE

## 2019-07-11 LAB — HEPATITIS C ANTIBODY
Hepatitis C Ab: NONREACTIVE
SIGNAL TO CUT-OFF: 0.01 (ref <1.00)

## 2019-07-12 ENCOUNTER — Telehealth: Payer: Self-pay | Admitting: Pharmacist

## 2019-07-12 ENCOUNTER — Encounter: Payer: Self-pay | Admitting: *Deleted

## 2019-07-12 ENCOUNTER — Other Ambulatory Visit: Payer: Self-pay | Admitting: *Deleted

## 2019-07-12 ENCOUNTER — Ambulatory Visit: Payer: Self-pay | Admitting: Pharmacist

## 2019-07-12 NOTE — Patient Outreach (Signed)
[place] [facility]) Care Management [facility] CM Telephone Outreach  07/12/2019  Samyuktha Brau 1960/04/21 [id number]  [place] [name], 59 y/o female referred to [facility] RN CM on April 25, 2019 by [facility] CMA for routine PCP referral for DM management and new diagnosis of CHF. Patient has had no recent unplanned hospitalizations. Patient has history including, but not limited to, HTN/ HLD; DM- typee II with most recent A1-C of 8.6 (8.6- [id number]); CKD- III; and anxiety/ depression.  HIPAA/ identity verified; patient reports she has "been really good;" and she sounds to be in no distress throughout call today.  Reports attended office visit 07/10/19 with PCP and "got a great report;" states she has not yet received final results from lab work-- newly resulted A1-C was reviewed with patient and positive reinforcement was provided for her lower A1-C result from last recorded value.  A1-C on 07/10/19: 6.6  (Down from 8.6 in February 2021)  She confirms that she received printed educational material from our phone conversation 06/25/19- we reviewed each of these today  Patient further reports:  -- continues using CGM system; issue with adherence to skin has been resolved: reports now using a "special tape" which seems to hold skin probe monitor in place well -- has been awoken several times during middle of night with CGM alarming "low" blood sugar, between 60-70; denies signs/ symptoms hypoglycemia; has occasionally eaten a snack, sometimes does not; reports has happened "2 or 3 times a week;" states she goes back to bed and subsequent fasting am blood sugars are "usually between 90-130" ---- signs/ symptoms hypoglycemia discussed with patient along with corresponding action plan ---- discussed with patient that if this continues to occur, and IF she has associated symptoms of hypoglycemia to eat a small snack ---- if it continues to occur and she DOES NOT  have associated symptoms, I encouraged her to check/ verify blood sugar using glucometer and to record on paper to share with providers; she verbalizes understanding ---- benefit of eating bedtime snack discussed with patient- she doe snot usually do this, but will consider  -- questions when she should take her sliding scale insulin around eating: instructed her to eat promptly after administering sliding scale insulin; advised to NOT take sliding scale insulin if she is not planning to eat promptly  -- we reviewed all previously provided printed educational material around DM diet/ A1-C/ and daily weight monitoring; encouraged patient to continue incorporating dietary strategies into her lifestyle as per education provided  -- daily weights now running between 217-218 lbs which represents some weight loss- positive reinforcement provided ---- patient able to verbalize weight gain guidelines/ corresponding action plan in setting of CHF; reports at baseline with chronic SOB   Patient denies further issues, concerns, or problems today. Iconfirmed that patient hasmy direct phone number, the main [name] office phone number, and the [facility] CM 24-hour nurse advice phone number should issues arise prior to next scheduled [name] outreachnext month.  Will hold off on health coach referral for now, as patient monitors potential episodes of hypoglycemia during night time hours. Encouraged patient to contact me directly if needs, questions, issues, or concerns arise prior to next scheduled outreach; patient agreed to do so.  Plan:  Patient will take medications as prescribed and will attend all scheduled provider appointments  Patient will promptly notify care providers for any new concerns/ issues/ problems that arise  Patient willcontinue monitoring and recording blood sugars at home 2-4 times per day  Patient will record any episodes of hypoglycemia and follow action plan for same  Patient  willcontinuemonitoring/ recording daily weightsat home  I willsend printed educationalmaterial and patient will review (Living Well With Diabetes booklet)  I will share today's [facility] CM note with [facility] Pharmacist, currently involved in patient's care as an FYI  [facility] CM outreach to continue with scheduled phone call nextmonth, possibly for transfer to [place], RN, BSN, [place] Care Management  [phone]

## 2019-07-12 NOTE — Chronic Care Management (AMB) (Signed)
  Chronic Care Management   Note  07/12/2019 Name: Nancy Barajas MRN: [id number] DOB: 1960/11/27  [name] is a 59 y.o. year old female who is a primary care patient of McLean-Scocuzza, Nino Glow, MD. The CCM team was consulted for assistance with chronic disease management and care coordination needs.    Received message from RN CM Reginia Naas that patient has reported some episodes of hypoglycemia overnight.   Contacted patient to discuss current insulin dosing and potential for reducing Humalog dose. Patient did not return my call by end of business today. Will collaborate w/ clinical staff for follow up with the patient next week, as I am out of the office.   [name], PharmD, [place], CPP Clinical Pharmacist [place] [phone]

## 2019-07-12 NOTE — Telephone Encounter (Signed)
Per RN CM Reginia Naas "She has been awoken several times during middle of night with CGM alarming "low" blood sugar, between 60-70; denies signs/ symptoms hypoglycemia; has occasionally eaten a snack, sometimes does not; reports has happened "2 or 3 times a week;" states she goes back to bed and subsequent fasting am blood sugars are "usually between 90-130"   Called patient to discuss and discuss decreasing insulin dose, had to LVM and she did not return my call before end of business.   Ariana/Dr. Olivia Mackie, can you follow up on this next week?

## 2019-07-15 DIAGNOSIS — R319 Hematuria, unspecified: Secondary | ICD-10-CM | POA: Insufficient documentation

## 2019-07-15 NOTE — Telephone Encounter (Signed)
Patient informed and verbalized understanding.  States she takes the 3mg  dose of the Trulicity weekly.

## 2019-07-15 NOTE — Telephone Encounter (Signed)
Low blood sugars  How much trulicity is she giving herself weekly?   I would stick to no more than 4 units of short acting insulin when needed I think 8 is too much for her and to prevent low blood sugars  Rec get glucose tablets over the counter as well

## 2019-07-15 NOTE — Addendum Note (Signed)
Addended by: Orland Mustard on: 07/15/2019 12:20 PM   Modules accepted: Orders

## 2019-07-16 ENCOUNTER — Telehealth (INDEPENDENT_AMBULATORY_CARE_PROVIDER_SITE_OTHER): Payer: Medicare Other | Admitting: Psychiatry

## 2019-07-16 ENCOUNTER — Other Ambulatory Visit: Payer: Self-pay

## 2019-07-16 DIAGNOSIS — F411 Generalized anxiety disorder: Secondary | ICD-10-CM | POA: Diagnosis not present

## 2019-07-16 DIAGNOSIS — F32A Depression, unspecified: Secondary | ICD-10-CM

## 2019-07-16 DIAGNOSIS — F329 Major depressive disorder, single episode, unspecified: Secondary | ICD-10-CM

## 2019-07-16 MED ORDER — SERTRALINE HCL 100 MG PO TABS: 100.0000 mg | ORAL_TABLET | Freq: Every day | ORAL | 1 refills | Status: DC

## 2019-07-16 NOTE — Progress Notes (Signed)
[place] MD/PA/NP OP Progress Note  07/16/2019 10:17 AM Nancy Barajas  MRN:  [id number] Interview was conducted using videoconferencing application and I verified that I was speaking with the correct person using two identifiers. I discussed the limitations of evaluation and management by telemedicine and  the availability of in person appointments. Patient expressed understanding and agreed to proceed. Patient location - home; physician - home office.  Chief Complaint: "I am doing well".  HPI: 59 yo DAAF with long hx of anxiety and some depression. She has been in counseling for the past few months and finds it helpful. She admits to worrying about variety of things: heath, daughter, finances. COVID pandemia did not help either. She also reports feeling somewhat depressed, tired, having occasional difficulty with night sleep and napping duirng the day. She denies feeling hopeless or suicidal. She had been prescribed sertraline 50 mg over 30 years ago; took it for a month or so but it did not help with anxiety. She has never been psychiatrically hospitalized. She denies having hx of mania, psychosis, alcohol or drug abuse. We have added sertraline 100 mg daily (was on 50 mg for first week). She tolerates it well and reports that anxiety has subsided and denies feeling depressed. She has still not tried trazodone for occasional insomnia but reports that her sleep has since improved. She is in individual counseling and finds it helpful.  Visit Diagnosis:    ICD-10-CM   1. GAD (generalized anxiety disorder)  F41.1   2. Depressive disorder  F32.9     Past Psychiatric History: Please see intake H&P.  Past Medical History:  Past Medical History:  Diagnosis Date  . Asthma   . Diabetes mellitus without complication ([place])   . Hypertension   . Kidney stones   . Vertigo    when laying flat  . Vocal cord dysfunction    makes it hard for patient to talk and breathe    Past Surgical History:   Procedure Laterality Date  . LITHOTRIPSY    . RIGHT/LEFT HEART CATH AND CORONARY ANGIOGRAPHY Bilateral 04/08/2019   Procedure: RIGHT/LEFT HEART CATH AND CORONARY ANGIOGRAPHY;  Surgeon: Wellington Hampshire, MD;  Location: [place] [facility];  Service: Cardiovascular;  Laterality: Bilateral;    Family Psychiatric History: None.  Family History:  Family History  Problem Relation Age of Onset  . Hypertension Mother   . Diabetes Mother   . Hypertension Father   . Diabetes Father     Social History:  Social History   Socioeconomic History  . Marital status: Divorced    Spouse name: Not on file  . Number of children: 2  . Years of education: Not on file  . Highest education level: 9th grade  Occupational History  . Not on file  Tobacco Use  . Smoking status: Former Smoker    Packs/day: 1.00    Years: 10.00    Pack years: 10.00    Quit date: 01/03/1998    Years since quitting: 21.5  . Smokeless tobacco: Never Used  Vaping Use  . Vaping Use: Never used  Substance and Sexual Activity  . Alcohol use: Never  . Drug use: Never  . Sexual activity: Not Currently  Other Topics Concern  . Not on file  Social History Narrative   Lives at home    2 daughters as of 04/04/19 32 y.o daughter lives with her (she works)   Patient is on disability.   Social Determinants of Health   Financial  Resource Strain: High Risk  . Difficulty of Paying Living Expenses: Hard  Food Insecurity: No Food Insecurity  . Worried About Charity fundraiser in the Last Year: Never true  . Ran Out of Food in the Last Year: Never true  Transportation Needs: No Transportation Needs  . Lack of Transportation (Medical): No  . Lack of Transportation (Non-Medical): No  Physical Activity: Unknown  . Days of Exercise per Week: 0 days  . Minutes of Exercise per Session: Not on file  Stress: No Stress Concern Present  . Feeling of Stress : Only a little  Social Connections: Unknown  . Frequency of Communication  with Friends and Family: Three times a week  . Frequency of Social Gatherings with Friends and Family: More than three times a week  . Attends Religious Services: Never  . Active Member of Clubs or Organizations: Yes  . Attends Archivist Meetings: Never  . Marital Status: Not on file    Allergies:  Allergies  Allergen Reactions  . Penicillins Rash    Did it involve swelling of the face/tongue/throat, SOB, or low BP? No Did it involve sudden or severe rash/hives, skin peeling, or any reaction on the inside of your mouth or nose? No Did you need to seek medical attention at a hospital or doctor's office? No When did it last happen?Childhood If all above answers are "NO", may proceed with cephalosporin use.    Metabolic Disorder Labs: Lab Results  Component Value Date   HGBA1C 6.6 (H) 07/10/2019   No results found for: PROLACTIN Lab Results  Component Value Date   CHOL 160 02/20/2019   TRIG 63.0 02/20/2019   HDL 79.70 02/20/2019   CHOLHDL 2 02/20/2019   VLDL 12.6 02/20/2019   LDLCALC 67 02/20/2019   LDLCALC 87 11/07/2018   Lab Results  Component Value Date   TSH 0.93 11/07/2018   TSH 1.36 08/07/2018    Therapeutic Level Labs: No results found for: LITHIUM No results found for: VALPROATE No components found for:  CBMZ  Current Medications: Current Outpatient Medications  Medication Sig Dispense Refill  . blood glucose meter kit and supplies Dispense based on patient and insurance preference. Use up to four times daily as directed. (FOR ICD-10 E10.9, E11.9). 1 each 0  . carvedilol (COREG) 25 MG tablet Take 1 tablet (25 mg total) by mouth 2 (two) times daily. 60 tablet 5  . Cholecalciferol (VITAMIN D-3) 125 MCG (5000 UT) TABS Take 1 tablet by mouth daily. (Patient taking differently: Take 5,000 Units by mouth daily. ) 90 tablet 3  . dapagliflozin propanediol (FARXIGA) 10 MG TABS tablet Take 1 tablet (10 mg total) by mouth daily before breakfast. 30  tablet 3  . Dulaglutide (TRULICITY) 3 QB/3.4LP SOPN Inject 3 mg into the skin once a week. 5 pen 5  . insulin lispro (HUMALOG KWIKPEN) 100 UNIT/ML KwikPen Check sugar 15-20 minutes before meals and give insulin based on sugar reading 70-130 0 units, 131-180 4 units, 181-240 8 units, 241-300 10 units, 301-350 12 units, 351-400 16 units, >400 20 units and call the doctor (Patient taking differently: Inject 4-16 Units into the skin See admin instructions. Check sugar 15-20 minutes before meals and give insulin based on sugar reading 70-130 0 units, 131-180 4 units, 181-240 8 units, 241-300 10 units, 301-350 12 units, 351-400 16 units, >400 20 units and call the doctor) 5 pen 11  . Insulin Pen Needle (PEN NEEDLES) 31G X 8 MM MISC 1 Device  by Does not apply route in the morning, at noon, and at bedtime. Use for humalog 300 each 12  . Multiple Vitamins-Minerals (CENTRUM SILVER 50+WOMEN PO) Take 1 tablet by mouth daily.    . mupirocin ointment (BACTROBAN) 2 % Apply 1 application topically 3 (three) times daily. 30 g 0  . ReliOn Ultra Thin Lancets MISC 1 Device by Does not apply route 4 (four) times daily -  before meals and at bedtime. relion brand 400 each 12  . rosuvastatin (CRESTOR) 10 MG tablet Take 10 mg by mouth every evening.     . sacubitril-valsartan (ENTRESTO) 97-103 MG Take 1 tablet by mouth 2 (two) times daily. 60 tablet 5  . sertraline (ZOLOFT) 100 MG tablet Take 1 tablet (100 mg total) by mouth daily. 90 tablet 1  . sodium chloride (OCEAN) 0.65 % SOLN nasal spray Place 1 spray into both nostrils as needed for congestion.    Marland Kitchen spironolactone (ALDACTONE) 25 MG tablet Take 1 tablet (25 mg total) by mouth daily. 30 tablet 5  . traZODone (DESYREL) 100 MG tablet Take 1 tablet (100 mg total) by mouth at bedtime as needed for sleep. (Patient not taking: Reported on 06/13/2019) 30 tablet 1   No current facility-administered medications for this visit.     Psychiatric Specialty Exam: Review of Systems   Psychiatric/Behavioral: The patient is nervous/anxious.   All other systems reviewed and are negative.   Last menstrual period 01/03/2013.There is no height or weight on file to calculate BMI.  General Appearance: Casual and Well Groomed  Eye Contact:  Good  Speech:  Clear and Coherent and Normal Rate  Volume:  Normal  Mood:  Less anxious.  Affect:  Full Range  Thought Process:  Goal Directed  Orientation:  Full (Time, Place, and Person)  Thought Content: Logical   Suicidal Thoughts:  No  Homicidal Thoughts:  No  Memory:  Immediate;   Good Recent;   Good Remote;   Good  Judgement:  Good  Insight:  Fair  Psychomotor Activity:  Normal  Concentration:  Concentration: Good  Recall:  Good  Fund of Knowledge: Good  Language: Good  Akathisia:  Negative  Handed:  Right  AIMS (if indicated): not done  Assets:  Communication Skills Desire for Improvement Financial Resources/Insurance Housing Social Support  ADL's:  Intact  Cognition: WNL  Sleep:  Fair   Screenings: GAD-7     Office Visit from 04/04/2019 in [place]  Total GAD-7 Score 11    PHQ2-9     Patient Outreach Telephone from 05/01/2019 in [place] Visit from 04/04/2019 in [place] Nutrition from 03/27/2019 in [place] from 03/01/2019 in [place] Office Visit from 11/07/2018 in [place]  PHQ-2 Total Score _0 0 3  PHQ-9 Total Score _1 -- 10       Assessment and Plan: 59 yo DAAF with long hx of anxiety and some depression. She has been in counseling for the past few months and finds it helpful. She admits to worrying about variety of things: heath, daughter, finances. COVID pandemia did not help either. She also reports feeling somewhat depressed, tired, having occasional difficulty with night sleep and napping duirng the day. She denies feeling hopeless or suicidal. She  had been prescribed sertraline 50 mg over 30 years ago; took it for a month or so but it did not help with anxiety. She has  never been psychiatrically hospitalized. She denies having hx of mania, psychosis, alcohol or drug abuse. We have added sertraline 100 mg daily (was on 50 mg for first week). She tolerates it well and reports that anxiety has subsided and denies feeling depressed. She has still not tried trazodone for occasional insomnia but reports that her sleep has since improved. She is in individual counseling and finds it helpful.  Dx: Generalized anxiety disorder; Depressive disorder unspecified - resolved  Plan: We will continue sertraline 100 mg daily (also has a bottle of trazodone 50 mg prn insomnia). Next appointment in 3 months.The plan was discussed with patient who had an opportunity to ask questions and these were all answered. I spend38mnutes in videoconferencingwith the patient.     OStephanie Acre MD 07/16/2019, 10:17 AM

## 2019-07-18 ENCOUNTER — Other Ambulatory Visit: Payer: Self-pay

## 2019-07-18 ENCOUNTER — Ambulatory Visit
Admission: RE | Admit: 2019-07-18 | Discharge: 2019-07-18 | Disposition: A | Discharge status: Home / Self Care | Payer: Medicare Other | Source: Ambulatory Visit | Attending: Internal Medicine | Admitting: Internal Medicine

## 2019-07-18 DIAGNOSIS — K769 Liver disease, unspecified: Secondary | ICD-10-CM | POA: Insufficient documentation

## 2019-07-18 DIAGNOSIS — N281 Cyst of kidney, acquired: Secondary | ICD-10-CM

## 2019-07-18 DIAGNOSIS — E041 Nontoxic single thyroid nodule: Secondary | ICD-10-CM | POA: Diagnosis not present

## 2019-07-18 DIAGNOSIS — E042 Nontoxic multinodular goiter: Secondary | ICD-10-CM | POA: Diagnosis not present

## 2019-07-19 ENCOUNTER — Ambulatory Visit
Admission: RE | Admit: 2019-07-19 | Discharge: 2019-07-19 | Disposition: A | Discharge status: Home / Self Care | Payer: Medicare Other | Source: Ambulatory Visit | Attending: Internal Medicine | Admitting: Internal Medicine

## 2019-07-19 DIAGNOSIS — K769 Liver disease, unspecified: Secondary | ICD-10-CM | POA: Insufficient documentation

## 2019-07-19 DIAGNOSIS — N281 Cyst of kidney, acquired: Secondary | ICD-10-CM | POA: Insufficient documentation

## 2019-07-19 DIAGNOSIS — D1803 Hemangioma of intra-abdominal structures: Secondary | ICD-10-CM | POA: Diagnosis not present

## 2019-07-19 DIAGNOSIS — Q600 Renal agenesis, unilateral: Secondary | ICD-10-CM | POA: Diagnosis not present

## 2019-07-21 NOTE — Addendum Note (Signed)
Addended by: Orland Mustard on: 07/21/2019 06:37 PM   Modules accepted: Orders

## 2019-07-22 ENCOUNTER — Ambulatory Visit: Admission: RE | Admit: 2019-07-22 | Payer: Medicare Other | Source: Ambulatory Visit

## 2019-07-25 ENCOUNTER — Other Ambulatory Visit: Payer: Self-pay

## 2019-07-25 ENCOUNTER — Ambulatory Visit
Admission: RE | Admit: 2019-07-25 | Discharge: 2019-07-25 | Disposition: A | Discharge status: Home / Self Care | Payer: Medicare Other | Source: Ambulatory Visit | Attending: Internal Medicine | Admitting: Internal Medicine

## 2019-07-25 DIAGNOSIS — N200 Calculus of kidney: Secondary | ICD-10-CM | POA: Diagnosis not present

## 2019-07-25 DIAGNOSIS — R319 Hematuria, unspecified: Secondary | ICD-10-CM | POA: Insufficient documentation

## 2019-07-25 DIAGNOSIS — N281 Cyst of kidney, acquired: Secondary | ICD-10-CM | POA: Diagnosis not present

## 2019-07-25 DIAGNOSIS — N289 Disorder of kidney and ureter, unspecified: Secondary | ICD-10-CM | POA: Diagnosis not present

## 2019-07-25 DIAGNOSIS — K769 Liver disease, unspecified: Secondary | ICD-10-CM | POA: Insufficient documentation

## 2019-07-25 DIAGNOSIS — I700 Atherosclerosis of aorta: Secondary | ICD-10-CM | POA: Diagnosis not present

## 2019-07-25 DIAGNOSIS — N201 Calculus of ureter: Secondary | ICD-10-CM | POA: Diagnosis not present

## 2019-07-28 ENCOUNTER — Encounter: Payer: Self-pay | Admitting: Internal Medicine

## 2019-07-28 ENCOUNTER — Telehealth: Payer: Self-pay | Admitting: Internal Medicine

## 2019-07-28 DIAGNOSIS — K429 Umbilical hernia without obstruction or gangrene: Secondary | ICD-10-CM

## 2019-07-28 DIAGNOSIS — K8020 Calculus of gallbladder without cholecystitis without obstruction: Secondary | ICD-10-CM | POA: Insufficient documentation

## 2019-07-28 NOTE — Telephone Encounter (Signed)
Ct scan abdomen pelvis 07/25/19 5 mm right ureter kidney stone and other kidney stones not blocking   She needs to see urology agreeable?   Right-sided obstructive uropathy is identified, due to a 5 mm obstructing proximal right ureteral calculus reference image 45/2. There is a 6 mm nonobstructing calculus within the lower pole right kidney.  At the left renal hilum there is a 3.9 x 3.9 x 4.4 cm structure that was previously identified on chest CT 02/07/2018. This measures between 30 and 40 Hounsfield units and is indeterminate on this unenhanced exam. Within this structure there is a 2.2 cm lamellated calculus, with no evidence of left-sided obstructive uropathy. Distal left ureters unremarkable. -->in the left kidney system could be structural difference of debris  -- > I rec f/u with urology this CT is rec kidney MRI with and w/o contrast but we have to use caution with her kidney #s I would rec f/u urology  She also has kidney stone in the left kidney 2.2 cm   Plaque build up in aorta= Aortic atherosclerosis  Small fat containing umbilical hernia

## 2019-07-29 ENCOUNTER — Telehealth: Payer: Self-pay | Admitting: Internal Medicine

## 2019-07-29 DIAGNOSIS — Z1211 Encounter for screening for malignant neoplasm of colon: Secondary | ICD-10-CM | POA: Diagnosis not present

## 2019-07-29 DIAGNOSIS — Z1212 Encounter for screening for malignant neoplasm of rectum: Secondary | ICD-10-CM | POA: Diagnosis not present

## 2019-07-29 LAB — COLOGUARD: Cologuard: POSITIVE — AB

## 2019-07-29 NOTE — Telephone Encounter (Signed)
Calling in CT abdomen pelvis report. Confirmed that this has been received by the MD and resulted.

## 2019-07-30 NOTE — Telephone Encounter (Signed)
Left message to return call 

## 2019-07-30 NOTE — Telephone Encounter (Signed)
Patient informed and verbalized understanding.  ° °Patient is agreeable to referral  °

## 2019-07-31 ENCOUNTER — Ambulatory Visit (INDEPENDENT_AMBULATORY_CARE_PROVIDER_SITE_OTHER): Payer: Medicare Other | Admitting: Psychology

## 2019-07-31 DIAGNOSIS — F418 Other specified anxiety disorders: Secondary | ICD-10-CM

## 2019-08-01 ENCOUNTER — Other Ambulatory Visit: Payer: Self-pay | Admitting: Internal Medicine

## 2019-08-01 DIAGNOSIS — N139 Obstructive and reflux uropathy, unspecified: Secondary | ICD-10-CM

## 2019-08-01 DIAGNOSIS — N200 Calculus of kidney: Secondary | ICD-10-CM

## 2019-08-01 DIAGNOSIS — E1122 Type 2 diabetes mellitus with diabetic chronic kidney disease: Secondary | ICD-10-CM

## 2019-08-01 DIAGNOSIS — N183 Chronic kidney disease, stage 3 unspecified: Secondary | ICD-10-CM

## 2019-08-01 NOTE — Addendum Note (Signed)
Addended by: Orland Mustard on: 08/01/2019 04:56 PM   Modules accepted: Orders

## 2019-08-02 LAB — COLOGUARD: COLOGUARD: POSITIVE — AB

## 2019-08-06 ENCOUNTER — Telehealth: Payer: Self-pay | Admitting: Internal Medicine

## 2019-08-06 ENCOUNTER — Encounter: Payer: Self-pay | Admitting: Internal Medicine

## 2019-08-06 NOTE — Telephone Encounter (Signed)
Patient informed and verbalized understanding.  She is agreeable

## 2019-08-06 NOTE — Telephone Encounter (Signed)
cologuard + rec colonoscopy is patient agreeable?

## 2019-08-08 ENCOUNTER — Telehealth: Payer: Self-pay | Admitting: Internal Medicine

## 2019-08-08 ENCOUNTER — Ambulatory Visit (INDEPENDENT_AMBULATORY_CARE_PROVIDER_SITE_OTHER): Payer: Medicare Other | Admitting: Pharmacist

## 2019-08-08 ENCOUNTER — Other Ambulatory Visit: Payer: Self-pay

## 2019-08-08 VITALS — BP 92/65 | HR 89 | Wt 215.0 lb

## 2019-08-08 DIAGNOSIS — I428 Other cardiomyopathies: Secondary | ICD-10-CM

## 2019-08-08 DIAGNOSIS — I10 Essential (primary) hypertension: Secondary | ICD-10-CM | POA: Diagnosis not present

## 2019-08-08 MED ORDER — CARVEDILOL 25 MG PO TABS: 25.0000 mg | ORAL_TABLET | Freq: Two times a day (BID) | ORAL | 3 refills | Status: DC

## 2019-08-08 MED ORDER — CARVEDILOL 12.5 MG PO TABS: 12.5000 mg | ORAL_TABLET | Freq: Two times a day (BID) | ORAL | 3 refills | Status: DC

## 2019-08-08 MED ORDER — SPIRONOLACTONE 25 MG PO TABS: 12.5000 mg | ORAL_TABLET | Freq: Every day | ORAL | 1 refills | Status: DC

## 2019-08-08 NOTE — Patient Instructions (Addendum)
Ms. Sacco,   Change in plans. We are going to DECREASE spironolactone to 12.5 mg daily. Continue carvedilol 25 mg twice daily and Entresto 97/105 mg twice daily. If we decrease the carvedilol, your heart rate may go back up.   See the enclosed information about how to turn off low/high glucose alarms. Or, you can change your low glucose alarm from 70 to 60.   Please call me if you have any questions!  [name], PharmD, [place], CPP Clinical Pharmacist [place] [phone]   Visit Information  Goals Addressed              This Visit's Progress     Patient Stated   .  "I want to be healthy (pt-stated)        CARE PLAN ENTRY (see longtitudinal plan of care for additional care plan information)  Current Barriers:  . Financial, social, and community barriers:  o Reports that she still struggles w/ anxiety, but continues to follow w/ therapist and psychiatrist.  o Notes that her wrist BP cuff seems to be fluctuant, but she cannot afford to purchase a new one . Diabetes: conontrolled; complicated by CHF, CKD; most recent A1c 6.6% . Current antihyperglycemic regimen: Trulicity 3 mg weekly; Farxiga 10 mg daily, Humalog sliding scale, however, she has stopped needing to use this. Has not used Humalog in >1 month o Previously on metformin, stopped d/t kidney function decline o Previously on Jardiance, stopped d/t recurrent yeast infections on low dose  . Current glucose readings Date of Download: 07/26/19-08/08/19 % Time CGM is active: 96% Average Glucose: 115 mg/dL Glucose Management Indicator: 6.1% Glucose Variability: 20 (goal <36%) Time in Goal:  - Time in range 70-180: 98% - Time above range: 2% - Time below range: 0% Observed patterns: well controlled within range. Some readings ~65 in the mornings but patient denies any concerns w/ hypoglycemic symptoms . Cardiovascular risk reduction (recent dx HFrEF, EF 25-30%);  follows w/ Dr. Garen Lah o Current hypertensive regimen: carvedilol 25 mg BID, spironolactone 25 mg daily, Entresto 97/103 mg BID o BP at office today hypotensive. Denies dizziness, lightheadedness, or being more dehydrated than usual.  Date Weight SBP DBP  HR  24-Jul 210 111 73 86  25-Jul 209 124 85 84  26-Jul  133 95 87  27-Jul 214 105 60 79  30-Jul  116 79 80  1-Aug 216 112 81 82  2-Aug  121 79 95  3-Aug  117 73 85  4-Aug 214 124 75 74   o Current hyperlipidemia regimen: rosuvastatin 10 mg; LDL at goal <70 . Depression/anxiety: follows w/ Dr. Shella Spearing w/ sertraline 100 mg daily; has not been using trazodone PRN script  . Kidney stones: scan blood tainting urine ~once per week. No pain. Urology appt next week.   Pharmacist Clinical Goal(s):  Marland Kitchen Over the next 90 days, patient with work with PharmD and primary care provider to address optimized glycemic management  Interventions: . Comprehensive medication review performed, medication list updated in electronic medical record . Inter-disciplinary care team collaboration (see longitudinal plan of care) . Discussed hypotension w/o symptoms w/ PCP. Decrease carvedilol to 12.5 mg BID. Continue spironolactone 25 mg daily and Entresto 97/103 mg BID. Patient verbalized understanding. Encouraged adequate hydration. . Provided information on how to change low glucose alarms to <65 to prevent readings of 68 waking her up overnight. Continues to lose weight, but denies the decreased appetite from GLP1 negatively impacting her quality of  life. Could consider reducing dose of Trulicity to 1.5 mg weekly, but would likely require bolus insulin with meals again, increasing hypoglycemic risk. Will discuss w/ PCP.  Marland Kitchen Patient unable to afford purchasing a arm BP cuff. Will collaborate w/ RN CM to evaluate for options to supply her with one.    Patient Self Care Activities:  . Patient will check blood glucose BID, document, and provide at future  appointments . Patient will take medications as prescribed . Patient will report any questions or concerns to provider   Please see past updates related to this goal by clicking on the "Past Updates" button in the selected goal         The patient verbalized understanding of instructions provided today and agreed to receive a mailed copy of patient instruction and/or educational materials.   Plan:  - Scheduled f/u call in ~ 6 weeks.   [name], PharmD, [place], CPP Clinical Pharmacist [place] [phone]

## 2019-08-08 NOTE — Telephone Encounter (Signed)
Having low Bps 80-90s sbp today in clinic  On entresto, coreg and spironlactone  Reduced spironolactone to 12.5 mg qam   Dr. Olivia Mackie McLean-Scocuzza [facility]

## 2019-08-08 NOTE — Addendum Note (Signed)
Addended by: De Hollingshead on: 08/08/2019 11:18 AM   Modules accepted: Orders

## 2019-08-08 NOTE — Chronic Care Management (AMB) (Addendum)
Chronic Care Management   Follow Up Note   08/08/2019 Name: Nancy Barajas MRN: [id number] DOB: 09-Nov-1960  Referred by: McLean-Scocuzza, Nino Glow, MD Reason for referral : Chronic Care Management (Medication Management)   Nancy Barajas is a 59 y.o. year old female who is a primary care patient of McLean-Scocuzza, Nino Glow, MD. The CCM team was consulted for assistance with chronic disease management and care coordination needs.  Contacted patient for medication management review.     Review of patient status, including review of consultants reports, relevant laboratory and other test results, and collaboration with appropriate care team members and the patient's provider was performed as part of comprehensive patient evaluation and provision of chronic care management services.    SDOH (Social Determinants of Health) assessments performed: Yes See Care Plan activities for detailed interventions related to SDOH)  SDOH Interventions     Most Recent Value  SDOH Interventions  Financial Strain Interventions Other (Comment)  [needs BP machine support]       Outpatient Encounter Medications as of 08/08/2019  Medication Sig Note  . blood glucose meter kit and supplies Dispense based on patient and insurance preference. Use up to four times daily as directed. (FOR ICD-10 E10.9, E11.9).   Marland Kitchen Cholecalciferol (VITAMIN D-3) 125 MCG (5000 UT) TABS Take 1 tablet by mouth daily. (Patient taking differently: Take 5,000 Units by mouth daily. )   . dapagliflozin propanediol (FARXIGA) 10 MG TABS tablet Take 1 tablet (10 mg total) by mouth daily before breakfast.   . Dulaglutide (TRULICITY) 3 XB/2.8UX SOPN Inject 3 mg into the skin once a week.   . Insulin Pen Needle (PEN NEEDLES) 31G X 8 MM MISC 1 Device by Does not apply route in the morning, at noon, and at bedtime. Use for humalog   . Multiple Vitamins-Minerals (CENTRUM SILVER 50+WOMEN PO) Take 1 tablet by mouth daily.   . mupirocin ointment  (BACTROBAN) 2 % Apply 1 application topically 3 (three) times daily.   . ReliOn Ultra Thin Lancets MISC 1 Device by Does not apply route 4 (four) times daily -  before meals and at bedtime. relion brand   . rosuvastatin (CRESTOR) 10 MG tablet Take 10 mg by mouth every evening.    . sacubitril-valsartan (ENTRESTO) 97-103 MG Take 1 tablet by mouth 2 (two) times daily.   . sertraline (ZOLOFT) 100 MG tablet Take 1 tablet (100 mg total) by mouth daily.   . sodium chloride (OCEAN) 0.65 % SOLN nasal spray Place 1 spray into both nostrils as needed for congestion.   Marland Kitchen spironolactone (ALDACTONE) 25 MG tablet Take 0.5 tablets (12.5 mg total) by mouth daily.   . [DISCONTINUED] carvedilol (COREG) 25 MG tablet Take 1 tablet (25 mg total) by mouth 2 (two) times daily.   . [DISCONTINUED] spironolactone (ALDACTONE) 25 MG tablet Take 1 tablet (25 mg total) by mouth daily.   . carvedilol (COREG) 25 MG tablet Take 1 tablet (25 mg total) by mouth 2 (two) times daily with a meal.   . insulin lispro (HUMALOG KWIKPEN) 100 UNIT/ML KwikPen Check sugar 15-20 minutes before meals and give insulin based on sugar reading 70-130 0 units, 131-180 4 units, 181-240 8 units, 241-300 10 units, 301-350 12 units, 351-400 16 units, >400 20 units and call the doctor (Patient not taking: Reported on 08/08/2019)   . traZODone (DESYREL) 100 MG tablet Take 1 tablet (100 mg total) by mouth at bedtime as needed for sleep. (Patient not taking: Reported on 06/13/2019) 05/20/2019: Patient  reports taking only as needed; reports taking "about once a week or once every other week"  . [DISCONTINUED] carvedilol (COREG) 12.5 MG tablet Take 1 tablet (12.5 mg total) by mouth 2 (two) times daily with a meal.    No facility-administered encounter medications on file as of 08/08/2019.     Objective:      Goals Addressed              This Visit's Progress     Patient Stated   .  "I want to be healthy (pt-stated)        CARE PLAN ENTRY (see  longtitudinal plan of care for additional care plan information)  Current Barriers:  . Financial, social, and community barriers:  o Reports that she still struggles w/ anxiety, but continues to follow w/ therapist and psychiatrist.  o Notes that her wrist BP cuff seems to be fluctuant, but she cannot afford to purchase a new one . Diabetes: conontrolled; complicated by CHF, CKD; most recent A1c 6.6% . Current antihyperglycemic regimen: Trulicity 3 mg weekly; Farxiga 10 mg daily, Humalog sliding scale, however, she has stopped needing to use this. Has not used Humalog in >1 month o Previously on metformin, stopped d/t kidney function decline o Previously on Jardiance, stopped d/t recurrent yeast infections on low dose  . Current glucose readings Date of Download: 07/26/19-08/08/19 % Time CGM is active: 96% Average Glucose: 115 mg/dL Glucose Management Indicator: 6.1% Glucose Variability: 20 (goal <36%) Time in Goal:  - Time in range 70-180: 98% - Time above range: 2% - Time below range: 0% Observed patterns: well controlled within range. Some readings ~65 in the mornings but patient denies any concerns w/ hypoglycemic symptoms . Cardiovascular risk reduction (recent dx HFrEF, EF 25-30%); follows w/ Dr. Garen Lah o Current hypertensive regimen: carvedilol 25 mg BID, spironolactone 25 mg daily, Entresto 97/103 mg BID o BP at office today hypotensive. Denies dizziness, lightheadedness, or being more dehydrated than usual.  Date Weight SBP DBP  HR  24-Jul 210 111 73 86  25-Jul 209 124 85 84  26-Jul  133 95 87  27-Jul 214 105 60 79  30-Jul  116 79 80  1-Aug 216 112 81 82  2-Aug  121 79 95  3-Aug  117 73 85  4-Aug 214 124 75 74   o Current hyperlipidemia regimen: rosuvastatin 10 mg; LDL at goal <70 . Depression/anxiety: follows w/ Dr. Shella Spearing w/ sertraline 100 mg daily; has not been using trazodone PRN script  . Kidney stones: scan blood tainting urine ~once per week. No pain.  Urology appt next week.   Pharmacist Clinical Goal(s):  Marland Kitchen Over the next 90 days, patient with work with PharmD and primary care provider to address optimized glycemic management  Interventions: . Comprehensive medication review performed, medication list updated in electronic medical record . Inter-disciplinary care team collaboration (see longitudinal plan of care) . Discussed hypotension w/o symptoms w/ PCP. Originally discussed decreasing carvedilol, but d/t patients hx tachycardia, will instead decrease spironolactone. Decrease spironolactone to 12.5 mg daily. Continue carvedilol 25 mg BID and Entresto 97/103 mg BID. Patient verbalized understanding. Encouraged adequate hydration.  [facility] pharmacy to confirm. Verified w/ pharmacy to d/c carvedilol 12.5 mg dose, carvedilol 25 mg and spironolactone 12.5 mg are accurate scripts . Provided information on how to change low glucose alarms to <65 to prevent readings of 68 waking her up overnight. Continues to lose weight, but denies the decreased appetite from GLP1 negatively impacting  her quality of life. Could consider reducing dose of Trulicity to 1.5 mg weekly, but would likely require bolus insulin with meals again, increasing hypoglycemic risk. Will discuss w/ PCP.  Marland Kitchen Patient unable to afford purchasing a arm BP cuff. Will collaborate w/ RN CM to evaluate for options to supply her with one.    Patient Self Care Activities:  . Patient will check blood glucose BID, document, and provide at future appointments . Patient will take medications as prescribed . Patient will report any questions or concerns to provider   Please see past updates related to this goal by clicking on the "Past Updates" button in the selected goal          Plan:  - Scheduled f/u call in ~ 6 weeks.   [name], PharmD, [place], CPP Clinical Pharmacist [place] [phone]

## 2019-08-09 NOTE — Addendum Note (Signed)
Addended by: Orland Mustard on: 08/09/2019 06:30 PM   Modules accepted: Orders

## 2019-08-09 NOTE — Telephone Encounter (Signed)
Referral sent [place] [facility]

## 2019-08-13 ENCOUNTER — Encounter: Payer: Self-pay | Admitting: *Deleted

## 2019-08-13 ENCOUNTER — Other Ambulatory Visit: Payer: Self-pay | Admitting: *Deleted

## 2019-08-13 NOTE — Patient Outreach (Signed)
[facility]) Care Management [facility] Telephone Outreach Care Coordination x 2   08/13/2019  Nancy Barajas 12-Aug-1960 [id number]  Nancy Barajas, 59 y/o female referred to [facility] RN CM on April 25, 2019 by [facility] CMA for routine PCP referral for DM management and new diagnosis of CHF. Patient has had no recent unplanned hospitalizations. Patient has history including, but not limited to, HTN/ HLD; DM- type II with A1-C of 8.6 (8.6- [id number]); CKD- III; and anxiety/ depression.  A1-C on 07/10/19: 6.6  (Down from 8.6 in February 2021)  HIPAA/ identity verified; patient reports shehas "been doing good;" and she sounds to be in no distress throughout call today.  Reports ongoing occasional episodes of dizziness; states she has not yet obtained new dose of spironolactone; states waiting on pharmacy to re-fill; reports she is unable to split her current dose of 25 mg in half to start taking newly prescribed dose of 12.5 mg QD: tells me she has therefore been taking spironolactone 25 mg po every other day, as she waits for her pharmacy to fill the 12.5 mg dose  11:25 am:  [facility] CM Telephone Outreach (via 3 way call) Care Coordination ([place] [phone]; spoke with Ebony Hail) Ebony Hail confirms that patient will not have refill available until 08/19/19; she further confirms that spironolactone is not manufactured in 12.5 mg dose and patient will need to self- cut pills using pill cutter, as their pharmacy does not provide this service for their patient's  Discussed with patient need to promptly obtain a pill cutter from a pharmacy; suggested that she take her pills to the pharmacy and obtain a pill cutter and have the pharmacy staff at the outpatient pharmacy show her how to use the pill cutter so that none of her doses are wasted and she is using properly; she agrees to do this tomorrow, as she has already taken  spironolactone 25 mg today.  12:10: contacted and spoke with [name], [facility] Pharmacist involved in patient's care to update her on all of above  She confirms that she received printed educational material from our phone conversation 07/12/19 and has found helpful  Patient further reports:  -- continues using CGM system; reports fasting blood sugar ranges between 86-104 with one isolated low in middle of night at 67-- however, patient followed up with finger stick using glucometer and obtained reading of "107" Reports post- prandial readings between 92-118 ---- continues to require reinforcement around self-health management of DM- reviewed and reinforced previously provided education ---- no overt signs of hypoglycemia reported today; states episodes of low readings in middle of night happen "about 2 times per week" ---- continues following action plan for low blood sugars as we discussed with last outreach ---- discussed with patient to continue to eat a small snack IF she has associated symptoms of hypoglycemia ---- if she DOES NOT have associated symptoms, I again encouraged her to check/ verify blood sugar using glucometer and to record on paper to share with providers ---- verified that patient is NOT using insulin: we reviewed use of her current medications for DM -- we reviewed all previously provided printed educational material around DM diet/ A1-C/ and daily weight monitoring; encouraged patient to continue incorporating dietary strategies into her lifestyle as per education provided  -- daily weights now running between 214-215 lbs which again represents some weight loss- positive reinforcement provided ---- patient able to verbalize weight gain guidelines/ corresponding action plan in setting of CHF;  reports at baseline with chronic SOB  ---- continues to require ongoing reinforcement around CHF ---- discussed value of discussing target weight with cardiology provider as her  daily weights are consistently reported today between 214-215 lbs over several weeks ---- discussed recent low blood pressure episodes with patient; she reports blood pressure ranges this week between 114- 130/ 74-84 ---- discussed options around obtaining new blood pressure cuff for home monitoring; also collaborated with [employer] around same ---- discussed and provided education around signs/ symptoms low blood pressure, as well as MI/ CVA   -- reviewed upcoming provider appointments with patient who provides accurate understanding of all with plans to attend all as scheduled; continues to drive self to appointments  Patient denies further issues, concerns, or problems today. Iconfirmed that patient hasmy direct phone number, the main [name] office phone number, and the [facility] CM 24-hour nurse advice phone number should issues arise prior to next scheduled [name] outreachnext month.  Will hold off on health coach referral for now, as patient continues to require care coordination and ongoing reinforcement of self health management of multiple chronic disease conditions.  Encouraged patient to contact me directly if needs, questions, issues, or concerns arise prior to next scheduled outreach; patient agreed to do so.  Plan:  Patient will take medications as prescribed and will attend all scheduled provider appointments  Patient will promptly notify care providers for any new concerns/ issues/ problems that arise  Patient willcontinue monitoring and recording blood sugars at home 2-4 times per day  Patient will record any episodes of hypoglycemia and follow action plan for same  Patient willcontinuemonitoring/ recording daily weightsand blood pressures at home  I will share today's [facility] CM note and care plan with patient's PCP and [facility] Pharmacist, as quarterly summary/ update  [name] outreach to continue with scheduled phone call nextmonth  Oneta Rack, RN, BSN,  [place] Care Management  [phone]

## 2019-08-14 ENCOUNTER — Other Ambulatory Visit: Payer: Self-pay

## 2019-08-14 ENCOUNTER — Ambulatory Visit (INDEPENDENT_AMBULATORY_CARE_PROVIDER_SITE_OTHER): Payer: Medicare Other | Admitting: Urology

## 2019-08-14 ENCOUNTER — Encounter: Payer: Self-pay | Admitting: Urology

## 2019-08-14 VITALS — BP 131/82 | HR 87 | Ht 71.0 in | Wt 210.0 lb

## 2019-08-14 DIAGNOSIS — R319 Hematuria, unspecified: Secondary | ICD-10-CM

## 2019-08-14 DIAGNOSIS — N200 Calculus of kidney: Secondary | ICD-10-CM

## 2019-08-14 MED ORDER — SULFAMETHOXAZOLE-TRIMETHOPRIM 800-160 MG PO TABS: 1.0000 | ORAL_TABLET | Freq: Two times a day (BID) | ORAL | 0 refills | Status: DC

## 2019-08-14 NOTE — Progress Notes (Signed)
08/14/19 11:00 AM   [name] 27-Nov-1960 [id number]  CC: Microscopic hematuria, right-sided hydronephrosis, history of nephrolithiasis  HPI: I saw Ms. Blumenstein in urology clinic for the above issues.  She was seen by her PCP and late July with recurrent episodes of microscopic hematuria and ultimately a CT abdomen pelvis without contrast was performed on 07/26/2019.  This showed a 5 mm right proximal ureteral stone, additional 6 mm right nonobstructing lower pole stone, right hydronephrosis, and on my review left dilated collecting system and a large 2.2 cm left renal pelvis stone.  She reportedly passed her small right-sided stone a few days later, and she denies any recurrent right-sided or left-sided flank pain.  She has had some intermittent hematuria during her stone passage.  She denies any fevers or chills, dysuria, urgency/frequency.  She denies any history of UTIs.  She has a history of shockwave lithotripsy with [facility] in [place], but she is unsure what side this was on.  This was at least a few years ago.  She has a distant smoking history when she was a teenager, but quit over 20 years ago.  She denies any other carcinogenic exposures.   Urinalysis today notable for 6-10 WBCs, greater than 30 RBCs, moderate bacteria, nitrite negative, leukocyte negative.  PMH: Past Medical History:  Diagnosis Date  . Asthma   . Diabetes mellitus without complication ([place])   . Hypertension   . Kidney stones   . Vertigo    when laying flat  . Vocal cord dysfunction    makes it hard for patient to talk and breathe    Surgical History: Past Surgical History:  Procedure Laterality Date  . LITHOTRIPSY    . RIGHT/LEFT HEART CATH AND CORONARY ANGIOGRAPHY Bilateral 04/08/2019   Procedure: RIGHT/LEFT HEART CATH AND CORONARY ANGIOGRAPHY;  Surgeon: Wellington Hampshire, MD;  Location: [place] [facility];  Service: Cardiovascular;  Laterality: Bilateral;    Family History: Family History    Problem Relation Age of Onset  . Hypertension Mother   . Diabetes Mother   . Hypertension Father   . Diabetes Father     Social History:  reports that she quit smoking about 21 years ago. She has a 10.00 pack-year smoking history. She has never used smokeless tobacco. She reports that she does not drink alcohol and does not use drugs.  Physical Exam: BP 131/82   Pulse 87   Ht 5\' 11"  (1.803 m)   Wt 210 lb (95.3 kg)   LMP 01/03/2013   BMI 29.29 kg/m    Constitutional:  Alert and oriented, No acute distress. Cardiovascular: No clubbing, cyanosis, or edema. Respiratory: Normal respiratory effort, no increased work of breathing. GI: Abdomen is soft, nontender, nondistended, no abdominal masses GU: No CVA tenderness  Laboratory Data: Reviewed, see HPI  Pertinent Imaging: I have personally reviewed the CT, see HPI for details  Assessment & Plan:   59 year old female with recurrent episodes of gross hematuria likely associated with passage of a 5 mm right ureteral stone, and what appears to be a large 2.2 cm left renal pelvis stone with some dilation of the left collecting system on recent CT.  She is currently asymptomatic with no flank pain or urinary symptoms.  We discussed various treatment options for urolithiasis including observation with or without medical expulsive therapy, shockwave lithotripsy (SWL), ureteroscopy and laser lithotripsy with stent placement, and percutaneous nephrolithotomy.  We discussed that management is based on stone size, location, density, patient co-morbidities, and patient  preference.   Stones <61mm in size have a >80% spontaneous passage rate. Data surrounding the use of tamsulosin for medical expulsive therapy is controversial, but meta analyses suggests it is most efficacious for distal stones between 5-55mm in size. Possible side effects include dizziness/lightheadedness, and retrograde ejaculation.  SWL has a lower stone free rate in a single  procedure, but also a lower complication rate compared to ureteroscopy and avoids a stent and associated stent related symptoms. Possible complications include renal hematoma, steinstrasse, and need for additional treatment.  Ureteroscopy with laser lithotripsy and stent placement has a higher stone free rate than SWL in a single procedure, however increased complication rate including possible infection, ureteral injury, bleeding, and stent related morbidity. Common stent related symptoms include dysuria, urgency/frequency, and flank pain.  PCNL is the favored treatment for stones >2cm. It involves a small incision in the flank, with complete fragmentation of stones and removal. It has the highest stone free rate, but also the highest complication rate. Possible complications include bleeding, infection/sepsis, injury to surrounding organs including the pleura, and collecting system injury.   After an extensive discussion of the risks and benefits of the above treatment options, the patient would like to proceed with left ureteroscopy, laser lithotripsy, stent placement, and right retrograde pyelogram to confirm clearance of small right-sided stone.  Schedule left ureteroscopy, laser lithotripsy, stent placement, right retrograde pyelogram, diagnostic cystoscopy Urine sent for culture, started on Bactrim to sterilize the urine prior to surgery  Nickolas Madrid, MD 08/14/2019  [facility] [address] [place], [place] [address] [phone]

## 2019-08-14 NOTE — Patient Instructions (Signed)
Laser Therapy for Kidney Stones Laser therapy for kidney stones is a procedure to break up small, hard mineral deposits that form in the kidney (kidney stones). The procedure is done using a device that produces a focused beam of light (laser). The laser breaks up kidney stones into pieces that are small enough to be passed out of the body through urination or removed from the body during the procedure. You may need laser therapy if you have kidney stones that are painful or block your urinary tract. This procedure is done by inserting a tube (ureteroscope) into your kidney through the urethral opening. The urethra is the part of the body that drains urine from the bladder. In women, the urethra opens above the vaginal opening. In men, the urethra opens at the tip of the penis. The ureteroscope is inserted through the urethra, and surgical instruments are moved through the bladder and the muscular tube that connects the kidney to the bladder (ureter) until they reach the kidney. Tell a health care provider about:  Any allergies you have.  All medicines you are taking, including vitamins, herbs, eye drops, creams, and over-the-counter medicines.  Any problems you or family members have had with anesthetic medicines.  Any blood disorders you have.  Any surgeries you have had.  Any medical conditions you have.  Whether you are pregnant or may be pregnant. What are the risks? Generally, this is a safe procedure. However, problems may occur, including:  Infection.  Bleeding.  Allergic reactions to medicines.  Damage to the urethra, bladder, or ureter.  Urinary tract infection (UTI).  Narrowing of the urethra (urethral stricture).  Difficulty passing urine.  Blockage of the kidney caused by a fragment of kidney stone. What happens before the procedure? Medicines  Ask your health care provider about: ? Changing or stopping your regular medicines. This is especially important if you  are taking diabetes medicines or blood thinners. ? Taking medicines such as aspirin and ibuprofen. These medicines can thin your blood. Do not take these medicines unless your health care provider tells you to take them. ? Taking over-the-counter medicines, vitamins, herbs, and supplements. Eating and drinking Follow instructions from your health care provider about eating and drinking, which may include:  8 hours before the procedure - stop eating heavy meals or foods, such as meat, fried foods, or fatty foods.  6 hours before the procedure - stop eating light meals or foods, such as toast or cereal.  6 hours before the procedure - stop drinking milk or drinks that contain milk.  2 hours before the procedure - stop drinking clear liquids. Staying hydrated Follow instructions from your health care provider about hydration, which may include:  Up to 2 hours before the procedure - you may continue to drink clear liquids, such as water, clear fruit juice, black coffee, and plain tea.  General instructions  You may have a physical exam before the procedure. You may also have tests, such as imaging tests and blood or urine tests.  If your ureter is too narrow, your health care provider may place a soft, flexible tube (stent) inside of it. The stent may be placed days or weeks before your laser therapy procedure.  Plan to have someone take you home from the hospital or clinic.  If you will be going home right after the procedure, plan to have someone stay with you for 24 hours.  Do not use any products that contain nicotine or tobacco for at least 4   weeks before the procedure. These products include cigarettes, e-cigarettes, and chewing tobacco. If you need help quitting, ask your health care provider.  Ask your health care provider: ? How your surgical site will be marked or identified. ? What steps will be taken to help prevent infection. These may include:  Removing hair at the surgery  site.  Washing skin with a germ-killing soap.  Taking antibiotic medicine. What happens during the procedure?   An IV will be inserted into one of your veins.  You will be given one or more of the following: ? A medicine to help you relax (sedative). ? A medicine to numb the area (local anesthetic). ? A medicine to make you fall asleep (general anesthetic).  A ureteroscope will be inserted into your urethra. The ureteroscope will send images to a video screen in the operating room to guide your surgeon to the area of your kidney that will be treated.  A small, flexible tube will be threaded through the ureteroscope and into your bladder and ureter, up to your kidney.  The laser device will be inserted into your kidney through the tube. Your surgeon will pulse the laser on and off to break up kidney stones.  A surgical instrument that has a tiny wire basket may be inserted through the tube into your kidney to remove the pieces of broken kidney stone. The procedure may vary among health care providers and hospitals. What happens after the procedure?  Your blood pressure, heart rate, breathing rate, and blood oxygen level will be monitored until you leave the hospital or clinic.  You will be given pain medicine as needed.  You may continue to receive antibiotics.  You may have a stent temporarily placed in your ureter.  Do not drive for 24 hours if you were given a sedative during your procedure.  You may be given a strainer to collect any stone fragments that you pass in your urine. Your health care provider may have these tested. Summary  Laser therapy for kidney stones is a procedure to break up kidney stones into pieces that are small enough to be passed out of the body through urination or removed during the procedure.  Follow instructions from your health care provider about eating and drinking before the procedure.  During the procedure, the ureteroscope will send images  to a video screen to guide your surgeon to the area of your kidney that will be treated.  Do not drive for 24 hours if you were given a sedative during your procedure. This information is not intended to replace advice given to you by your health care provider. Make sure you discuss any questions you have with your health care provider. Document Revised: 08/31/2017 Document Reviewed: 08/31/2017 Elsevier Patient Education  2020 [employer]   Ureteral Stent Implantation  Ureteral stent implantation is a procedure to insert (implant) a flexible, soft, plastic tube (stent) into a ureter. Ureters are the tube-like parts of the body that drain urine from the kidneys. The stent supports the ureter while it heals and helps to drain urine. You may have a ureteral stent implanted after having a procedure to remove a blockage from the ureter (ureterolysis or pyeloplasty). You may also have a stent implanted to open the flow of urine when you have a blockage caused by a kidney stone, tumor, blood clot, or infection. You have two ureters, one on each side of the body. The ureters connect the kidneys to the organ that holds urine   until it passes out of the body (bladder). The stent is placed so that one end is in the kidney, and one end is in the bladder. The stent is usually taken out after your ureter has healed. Depending on your condition, you may have a stent for just a few weeks, or you may have a long-term stent that will need to be replaced every few months. Tell a health care provider about:  Any allergies you have.  All medicines you are taking, including vitamins, herbs, eye drops, creams, and over-the-counter medicines.  Any problems you or family members have had with anesthetic medicines.  Any blood disorders you have.  Any surgeries you have had.  Any medical conditions you have.  Whether you are pregnant or may be pregnant. What are the risks? Generally, this is a safe procedure.  However, problems may occur, including:  Infection.  Bleeding.  Allergic reactions to medicines.  Damage to other structures or organs. Tearing (perforation) of the ureter is possible.  Movement of the stent away from where it is placed during surgery (migration). What happens before the procedure? Medicines Ask your health care provider about:  Changing or stopping your regular medicines. This is especially important if you are taking diabetes medicines or blood thinners.  Taking medicines such as aspirin and ibuprofen. These medicines can thin your blood. Do not take these medicines unless your health care provider tells you to take them.  Taking over-the-counter medicines, vitamins, herbs, and supplements. Eating and drinking Follow instructions from your health care provider about eating and drinking, which may include:  8 hours before the procedure - stop eating heavy meals or foods, such as meat, fried foods, or fatty foods.  6 hours before the procedure - stop eating light meals or foods, such as toast or cereal.  6 hours before the procedure - stop drinking milk or drinks that contain milk.  2 hours before the procedure - stop drinking clear liquids. Staying hydrated Follow instructions from your health care provider about hydration, which may include:  Up to 2 hours before the procedure - you may continue to drink clear liquids, such as water, clear fruit juice, black coffee, and plain tea. General instructions  Do not drink alcohol.  Do not use any products that contain nicotine or tobacco for at least 4 weeks before the procedure. These products include cigarettes, e-cigarettes, and chewing tobacco. If you need help quitting, ask your health care provider.  You may have an exam or testing, such as imaging or blood tests.  Ask your health care provider what steps will be taken to help prevent infection. These may include: ? Removing hair at the surgery  site. ? Washing skin with a germ-killing soap. ? Taking antibiotic medicine.  Plan to have someone take you home from the hospital or clinic.  If you will be going home right after the procedure, plan to have someone with you for 24 hours. What happens during the procedure?  An IV will be inserted into one of your veins.  You may be given a medicine to help you relax (sedative).  You may be given a medicine to make you fall asleep (general anesthetic).  A thin, tube-shaped instrument with a light and tiny camera at the end (cystoscope) will be inserted into your urethra. The urethra is the tube that drains urine from the bladder out of the body. In men, the urethra opens at the end of the penis. In women, the urethra opens   in front of the vaginal opening.  The cystoscope will be passed into your bladder.  A thin wire (guide wire) will be passed through your bladder and into your ureter. This is used to guide the stent into your ureter.  The stent will be inserted into your ureter.  The guide wire and the cystoscope will be removed.  A flexible tube (catheter) may be inserted through your urethra so that one end is in your bladder. This helps to drain urine from your bladder. The procedure may vary among hospitals and health care providers. What happens after the procedure?  Your blood pressure, heart rate, breathing rate, and blood oxygen level will be monitored until you leave the hospital or clinic.  You may continue to receive medicine and fluids through an IV.  You may have some soreness or pain in your abdomen and urethra. Medicines will be available to help you.  You will be encouraged to get up and walk around as soon as you can.  You may have a catheter draining your urine.  You will have some blood in your urine.  Do not drive for 24 hours if you were given a sedative during your procedure. Summary  Ureteral stent implantation is a procedure to insert a flexible,  soft, plastic tube (stent) into a ureter.  You may have a stent implanted to support the ureter while it heals after a procedure or to open the flow of urine if there is a blockage.  Follow instructions from your health care provider about taking medicines and about eating and drinking before the procedure.  Depending on your condition, you may have a stent for just a few weeks, or you may have a long-term stent that will need to be replaced every few months. This information is not intended to replace advice given to you by your health care provider. Make sure you discuss any questions you have with your health care provider. Document Revised: 09/26/2017 Document Reviewed: 09/27/2017 Elsevier Patient Education  2020 Elsevier Inc.   

## 2019-08-15 LAB — URINALYSIS, COMPLETE
Bilirubin, UA: NEGATIVE
Ketones, UA: NEGATIVE
Leukocytes,UA: NEGATIVE
Nitrite, UA: NEGATIVE
Specific Gravity, UA: 1.025 (ref 1.005–1.030)
Urobilinogen, Ur: 0.2 mg/dL (ref 0.2–1.0)
pH, UA: 5.5 (ref 5.0–7.5)

## 2019-08-15 LAB — MICROSCOPIC EXAMINATION: RBC, Urine: >30 /hpf — AB (ref 0–2)

## 2019-08-16 ENCOUNTER — Other Ambulatory Visit: Payer: Self-pay | Admitting: Urology

## 2019-08-22 ENCOUNTER — Other Ambulatory Visit: Payer: Self-pay

## 2019-08-22 ENCOUNTER — Encounter
Admission: RE | Admit: 2019-08-22 | Discharge: 2019-08-22 | Disposition: A | Discharge status: Home / Self Care | Payer: Medicare Other | Source: Ambulatory Visit | Attending: Urology | Admitting: Urology

## 2019-08-22 HISTORY — DX: Personal history of urinary calculi: Z87.442

## 2019-08-22 HISTORY — DX: Anxiety disorder, unspecified: F41.9

## 2019-08-22 HISTORY — DX: Depression, unspecified: F32.A

## 2019-08-22 NOTE — Patient Instructions (Addendum)
COVID TESTING Date: August 28, 2019 [facility] Testing site:  [address] Thru Hours:  2:75 am - 1:00 pm Once you are tested, you are asked to stay quarantined (avoiding public places) until after your surgery.   Your procedure is scheduled on: August 30, 2019 FRIDAY Report to Day Surgery on the 2nd floor of the [facility]. To find out your arrival time, please call [phone] between 1PM - 3PM on: Thursday August 29, 2019  REMEMBER: Instructions that are not followed completely may result in serious medical risk, up to and including death; or upon the discretion of your surgeon and anesthesiologist your surgery may need to be rescheduled.  Do not eat food after midnight the night before surgery.  No gum chewing, lozengers or hard candies.  You may however, drink CLEAR liquids up to 2 hours before you are scheduled to arrive for your surgery. Do not drink anything within 2 hours of your scheduled arrival time.  Clear liquids include: - water   Do NOT drink anything that is not on this list.  Type 1 and Type 2 diabetics should only drink water.  TAKE THESE MEDICATIONS THE MORNING OF SURGERY WITH A SIP OF WATER: CARVEDILOL SERTRALINE  USE NASAL SPRAY  Take 1/2 of usual insulin dose the night before surgery and none on the morning of surgery.  Stop Anti-inflammatories (NSAIDS) such as Advil, Aleve, Ibuprofen, Motrin, Naproxen, Naprosyn and ASPIRIN OR Aspirin based products such as Excedrin, Goodys Powder, BC Powder. (May take Tylenol or Acetaminophen if needed.)  Stop ANY OVER THE COUNTER supplements until after surgery. (May continue Vitamin D, Vitamin B, and multivitamin.)  No Alcohol for 24 hours before or after surgery.  No Smoking including e-cigarettes for 24 hours prior to surgery.  No chewable tobacco products for at least 6 hours prior to surgery.  No nicotine patches on the day of surgery.  Do not use any  "recreational" drugs for at least a week prior to your surgery.  Please be advised that the combination of cocaine and anesthesia may have negative outcomes, up to and including death. If you test positive for cocaine, your surgery will be cancelled.  On the morning of surgery brush your teeth with toothpaste and water, you may rinse your mouth with mouthwash if you wish. Do not swallow any toothpaste or mouthwash.  Do not wear jewelry, make-up, hairpins, clips or nail polish.  Do not wear lotions, powders, or perfumes.   Do not shave 48 hours prior to surgery.   Contact lenses, hearing aids and dentures may not be worn into surgery.  Do not bring valuables to the hospital. [facility] is not responsible for any missing/lost belongings or valuables.   SHOWER DAY OF SURGERY  Notify your doctor if there is any change in your medical condition (cold, fever, infection).  Wear comfortable clothing (specific to your surgery type) to the hospital.  Plan for stool softeners for home use; pain medications have a tendency to cause constipation. You can also help prevent constipation by eating foods high in fiber such as fruits and vegetables and drinking plenty of fluids as your diet allows.  After surgery, you can help prevent lung complications by doing breathing exercises.  Take deep breaths and cough every 1-2 hours. Your doctor may order a device called an Incentive Spirometer to help you take deep breaths. When coughing or sneezing, hold a pillow firmly against your incision with both hands. This is  called "splinting." Doing this helps protect your incision. It also decreases belly discomfort.  If you are being discharged the day of surgery, you will not be allowed to drive home. You will need a responsible adult (18 years or older) to drive you home and stay with you that night.   Please call the [facility] at [phone] if you have any questions about these  instructions.  Visitation Policy:  Patients undergoing a surgery or procedure may have one family member or support person with them as long as that person is not COVID-19 positive or experiencing its symptoms.  That person may remain in the waiting area during the procedure.  Children under 65 years of age may have both parents or legal guardians with them during their procedure.  Inpatient Visitation Update:   In an effort to ensure the safety of our team members and our patients, we are implementing a change to our visitation policy:  Effective Monday, Aug. 9, at 7 a.m., inpatients will be allowed one support person.  o The support person may change daily.  o The support person must pass our screening, gel in and out, and wear a mask at all times, including in the patient's room.  o Patients must also wear a mask when staff or their support person are in the room.  o Masking is required regardless of vaccination status.  Systemwide, no visitors 17 or younger.

## 2019-08-23 ENCOUNTER — Ambulatory Visit (INDEPENDENT_AMBULATORY_CARE_PROVIDER_SITE_OTHER): Payer: Medicare Other | Admitting: Psychology

## 2019-08-23 DIAGNOSIS — F418 Other specified anxiety disorders: Secondary | ICD-10-CM | POA: Diagnosis not present

## 2019-08-28 ENCOUNTER — Other Ambulatory Visit: Payer: Self-pay

## 2019-08-28 ENCOUNTER — Other Ambulatory Visit
Admission: RE | Admit: 2019-08-28 | Discharge: 2019-08-28 | Disposition: A | Discharge status: Home / Self Care | Payer: Medicare Other | Source: Ambulatory Visit | Attending: Urology | Admitting: Urology

## 2019-08-28 DIAGNOSIS — Z20822 Contact with and (suspected) exposure to covid-19: Secondary | ICD-10-CM | POA: Diagnosis not present

## 2019-08-28 DIAGNOSIS — Z01812 Encounter for preprocedural laboratory examination: Secondary | ICD-10-CM | POA: Diagnosis not present

## 2019-08-28 LAB — SARS CORONAVIRUS 2 (TAT 6-24 HRS): SARS Coronavirus 2: NEGATIVE

## 2019-08-28 MED ORDER — DAPAGLIFLOZIN PROPANEDIOL 10 MG PO TABS
10.0000 mg | ORAL_TABLET | Freq: Every day | ORAL | 0 refills | Status: DC
Start: 2019-08-28 — End: 2019-10-28

## 2019-09-05 DIAGNOSIS — E042 Nontoxic multinodular goiter: Secondary | ICD-10-CM | POA: Diagnosis not present

## 2019-09-10 ENCOUNTER — Ambulatory Visit (INDEPENDENT_AMBULATORY_CARE_PROVIDER_SITE_OTHER): Payer: Medicare Other

## 2019-09-10 ENCOUNTER — Other Ambulatory Visit: Payer: Self-pay

## 2019-09-10 DIAGNOSIS — I428 Other cardiomyopathies: Secondary | ICD-10-CM | POA: Diagnosis not present

## 2019-09-10 DIAGNOSIS — I10 Essential (primary) hypertension: Secondary | ICD-10-CM

## 2019-09-10 LAB — ECHOCARDIOGRAM COMPLETE
Area-P 1/2: 3.63 cm2
Calc EF: 42.6 %
S' Lateral: 3.1 cm
Single Plane A2C EF: 29.8 %
Single Plane A4C EF: 50.7 %

## 2019-09-11 ENCOUNTER — Encounter: Payer: Self-pay | Admitting: *Deleted

## 2019-09-11 ENCOUNTER — Other Ambulatory Visit: Payer: Self-pay | Admitting: *Deleted

## 2019-09-11 ENCOUNTER — Encounter: Payer: Medicare Other | Admitting: Urology

## 2019-09-11 ENCOUNTER — Other Ambulatory Visit: Payer: Medicare Other

## 2019-09-11 NOTE — Patient Outreach (Signed)
[facility]) Care Management [facility] CM Telephone Outreach  09/11/2019  Nancy Barajas [birth year] [id number]  Nancy Barajas, 59 y/o female referred to [facility] RN CM on April 25, 2019 by [facility] CMA for routine PCP referral for DM management and new diagnosis of CHF. Patient has had no recent unplanned hospitalizations. Patient has history including, but not limited to, HTN/ HLD; DM- type II with A1-C of 8.6 (8.6- [id number]); CKD- III; and anxiety/ depression.  A1-C on 07/10/19: 6.6(Down from 8.6in February 2021)  HIPAA/ identity verified; patient reports shehas "been doingpretty good;" and she sounds to be in no distress throughout call today.  Reports no clinical concerns and confirms that she has attended all recent provider appointments; reports to have lithotripsy/ thyroid nodule biopsy in coming weeks.  -- reviewed recent and upcoming provider appointments; confirmed patient is aware of all and has attended/ plans to attend all as scheduled: has cardiology appointment tomorrow to review results from recent ECHOcardiogram -- confirmed no recent changes to medications; confirmed patient has been taking spironolactone as ordered and is now receiving pills from pharmacy that can be split to obtain prescribed dose -- reviewed recent blood sugars and weights at home: reports all "been good" recently ---- reports blood sugars consistently 110-150 fasting, occasional lows without associated symptoms; continues double-checking low values from CGM with glucometer finger sticks; reports occasional high blood sugars post-prandial, states this is not a common occurrence ---- daily weight ranges reported between 201-210 lbs -- discussed overall clinical condition and confirmed she has no current clinical concerns  Patient denies further issues, concerns, or problems today. Iconfirmed that patient hasmy direct phone number, the main [name] office  phone number, and the [facility] CM 24-hour nurse advice phone number should issues arise prior to next scheduled [name] outreachnext month. Will hold off on health coach referral for now, as patient continues to require care coordination and ongoing reinforcement of self health management of multiple chronic disease conditions; will re-visit at time of next outreach.  Encouraged patient to contact me directly if needs, questions, issues, or concerns arise prior to next scheduled outreach; patient agreed to do so.  Plan:  Patient will take medications as prescribed and will attend all scheduled provider appointments  Patient will promptly notify care providers for any new concerns/ issues/ problems that arise  Patient willcontinue monitoring and recording blood sugars at home 2-4 times per day  Patient will record any episodes of hypoglycemia and follow action plan for same  Patient willcontinuemonitoring/ recording daily weightsand blood pressures at home  [facility] CM outreach to continue with scheduled phone call nextmonth  Oneta Rack, RN, BSN, [facility]  [phone]

## 2019-09-12 ENCOUNTER — Encounter: Payer: Self-pay | Admitting: Family

## 2019-09-12 ENCOUNTER — Other Ambulatory Visit: Payer: Self-pay

## 2019-09-12 ENCOUNTER — Ambulatory Visit (INDEPENDENT_AMBULATORY_CARE_PROVIDER_SITE_OTHER): Payer: Medicare Other | Admitting: Family

## 2019-09-12 ENCOUNTER — Ambulatory Visit (INDEPENDENT_AMBULATORY_CARE_PROVIDER_SITE_OTHER): Payer: Medicare Other | Admitting: Psychology

## 2019-09-12 VITALS — BP 118/70 | HR 93 | Ht 71.0 in | Wt 209.0 lb

## 2019-09-12 DIAGNOSIS — I428 Other cardiomyopathies: Secondary | ICD-10-CM | POA: Diagnosis not present

## 2019-09-12 DIAGNOSIS — I5020 Unspecified systolic (congestive) heart failure: Secondary | ICD-10-CM | POA: Diagnosis not present

## 2019-09-12 DIAGNOSIS — F418 Other specified anxiety disorders: Secondary | ICD-10-CM

## 2019-09-12 DIAGNOSIS — I10 Essential (primary) hypertension: Secondary | ICD-10-CM | POA: Diagnosis not present

## 2019-09-12 NOTE — Patient Instructions (Signed)
Medication Instructions:  Your physician recommends that you continue on your current medications as directed. Please refer to the Current Medication list given to you today.  *If you need a refill on your cardiac medications before your next appointment, please call your pharmacy*  Follow-Up: At [facility], you and your health needs are our priority.  As part of our continuing mission to provide you with exceptional heart care, we have created designated Provider Care Teams.  These Care Teams include your primary Cardiologist (physician) and Advanced Practice Providers (APPs -  Physician Assistants and Nurse Practitioners) who all work together to provide you with the care you need, when you need it.  We recommend signing up for the patient portal called "MyChart".  Sign up information is provided on this After Visit Summary.  MyChart is used to connect with patients for Virtual Visits (Telemedicine).  Patients are able to view lab/test results, encounter notes, upcoming appointments, etc.  Non-urgent messages can be sent to your provider as well.   To learn more about what you can do with MyChart, go to [email or web address].    Your next appointment:   2 month(s)  The format for your next appointment:   In Person  Provider:    You may see Dr. Garen Lah or one of the following Advanced Practice Providers on your designated Care Team:    Murray Hodgkins, NP  Christell Faith, PA-C  Marrianne Mood, PA-C

## 2019-09-13 ENCOUNTER — Telehealth: Payer: Self-pay | Admitting: Internal Medicine

## 2019-09-13 ENCOUNTER — Ambulatory Visit: Payer: Medicare Other | Admitting: Family

## 2019-09-13 NOTE — Progress Notes (Signed)
Office Visit    Patient Name: Nancy Barajas Date of Encounter: 09/13/2019  Primary Care Provider:  McLean-Scocuzza, Nino Glow, MD Primary Cardiologist:  No primary care provider on file. Electrophysiologist:  None   Chief Complaint    Nancy Barajas is a 59 y.o. female with a hx of HFrEF, nonischemic cardiomyopathy, DM2, HLD, CKD 3 presents today for follow-up after echocardiogram  Past Medical History    Past Medical History:  Diagnosis Date  . Anxiety   . Asthma   . Depression   . Diabetes mellitus without complication ([place])   . History of kidney stones   . Hypertension   . Kidney stones   . Vertigo    when laying flat  . Vocal cord dysfunction    makes it hard for patient to talk and breathe   Past Surgical History:  Procedure Laterality Date  . CARDIAC CATHETERIZATION    . LITHOTRIPSY    . RIGHT/LEFT HEART CATH AND CORONARY ANGIOGRAPHY Bilateral 04/08/2019   Procedure: RIGHT/LEFT HEART CATH AND CORONARY ANGIOGRAPHY;  Surgeon: Wellington Hampshire, MD;  Location: [facility];  Service: Cardiovascular;  Laterality: Bilateral;    Allergies  Allergies  Allergen Reactions  . Penicillins Rash    Did it involve swelling of the face/tongue/throat, SOB, or low BP? No Did it involve sudden or severe rash/hives, skin peeling, or any reaction on the inside of your mouth or nose? No Did you need to seek medical attention at a hospital or doctor's office? No When did it last happen?Childhood If all above answers are "NO", may proceed with cephalosporin use.    History of Present Illness    Nancy Barajas is a 59 y.o. female with a hx of HFrEF, nonischemic cardiomyopathy, DM2, HLD, CKD 3  last seen 05/27/2019 by Dr. Garen Lah.  She was seen in consult by Dr. Garen Lah 12/2018 for elevated blood pressures.  Her medications were titrated.  She had renal artery duplex 03/12/2019 with no evidence of renal artery stenosis.  She was recommended for echo due  to suggestion of LVH on EKG.  Subsequent echocardiogram performed 03/23/2019 with EF 25-30%, mild LVH, no significant valvular abnormalities.  Cardiac catheterization was performed to rule out ischemia as etiology of reduced EF.  R/LHC 04/08/2019 with no coronary disease.  Confirm diagnosis of nonischemic cardiomyopathy.  RHC with mildly elevated filling pressures, mild pulmonary hypertension, high normal cardiac output.  Her guideline directed medical therapy for heart failure is continue to be optimized.  She had repeat echocardiogram for monitoring 09/10/2019 with LVEF improved to 40-45%, RV normal size and function, normal PASP, trivial MR.  She has upcoming left utereoscopy, laser lithotripsy, stent placement, right retrograde pyelogram, diagnostic cystoscopy with Dr. Diamantina Providence.  She is also presently undergoing work-up for thyroid nodule with Dr. Honor Junes and has FNA upcoming.   Presents today for follow-up.  Reports feeling overall well.  We reviewed her echocardiogram results in detail and she was reassured by the results. Reports no shortness of breath.  Reports her dyspnea on exertion is stable at baseline.. Reports no chest pain, pressure, or tightness. No edema, orthopnea, PND. Reports no palpitations.  Reports no lightheadedness, dizziness, near-syncope, syncope.   EKGs/Labs/Other Studies Reviewed:   The following studies were reviewed today: Echo 09/10/19  1. Left ventricular ejection fraction, by estimation, is 40 to 45%. The  left ventricle has mildly decreased function. The left ventricle  demonstrates global hypokinesis. Left ventricular diastolic parameters are  indeterminate.   2.  Right ventricular systolic function is normal. The right ventricular  size is normal. There is normal pulmonary artery systolic pressure.   3. The mitral valve is grossly normal. Trivial mitral valve  regurgitation.   4. The aortic valve was not well visualized. Aortic valve regurgitation  is not visualized.  No aortic stenosis is present.   5. The inferior vena cava is normal in size with greater than 50%  respiratory variability, suggesting right atrial pressure of 3 mmHg.   LHC 04/08/19 No evidence of coronary artery disease.   Echo 03/20/19  1. Left ventricular ejection fraction, by estimation, is 25 to 30%. The  left ventricle has severely decreased function. The left ventricle  demonstrates global hypokinesis. There is mild left ventricular  hypertrophy. Left ventricular diastolic parameters   are consistent with Grade I diastolic dysfunction (impaired relaxation).   2. Right ventricular systolic function is normal. The right ventricular  size is normal.   3. The mitral valve is normal in structure. No evidence of mitral valve  regurgitation.   4. The aortic valve is tricuspid. Aortic valve regurgitation is not  visualized.   5. Pulmonic valve regurgitation not assessed.   6. The inferior vena cava is normal in size with greater than 50%  respiratory variability, suggesting right atrial pressure of 3 mmHg.   EKG: No EKG performed today.  Recent Labs: 11/07/2018: TSH 0.93 02/20/2019: ALT 11 04/02/2019: Hemoglobin 11.9; Platelets 376 05/14/2019: BUN 27; Creatinine, Ser 1.21; Potassium 4.6; Sodium 138  Recent Lipid Panel    Component Value Date/Time   CHOL 160 02/20/2019 0851   TRIG 63.0 02/20/2019 0851   HDL 79.70 02/20/2019 0851   CHOLHDL 2 02/20/2019 0851   VLDL 12.6 02/20/2019 0851   LDLCALC 67 02/20/2019 0851    Home Medications   Current Meds  Medication Sig  . blood glucose meter kit and supplies Dispense based on patient and insurance preference. Use up to four times daily as directed. (FOR ICD-10 E10.9, E11.9).  . carvedilol (COREG) 25 MG tablet Take 1 tablet (25 mg total) by mouth 2 (two) times daily with a meal.  . Cholecalciferol (VITAMIN D-3) 125 MCG (5000 UT) TABS Take 1 tablet by mouth daily. (Patient taking differently: Take 5,000 Units by mouth daily. )  .  dapagliflozin propanediol (FARXIGA) 10 MG TABS tablet Take 1 tablet (10 mg total) by mouth daily before breakfast.  . Dulaglutide (TRULICITY) 3 VO/5.9YT SOPN Inject 3 mg into the skin once a week.  . insulin lispro (HUMALOG KWIKPEN) 100 UNIT/ML KwikPen Check sugar 15-20 minutes before meals and give insulin based on sugar reading 70-130 0 units, 131-180 4 units, 181-240 8 units, 241-300 10 units, 301-350 12 units, 351-400 16 units, >400 20 units and call the doctor  . Insulin Pen Needle (PEN NEEDLES) 31G X 8 MM MISC 1 Device by Does not apply route in the morning, at noon, and at bedtime. Use for humalog  . Multiple Vitamins-Minerals (CENTRUM SILVER 50+WOMEN PO) Take 1 tablet by mouth daily.  . mupirocin ointment (BACTROBAN) 2 % Apply 1 application topically 3 (three) times daily.  . ReliOn Ultra Thin Lancets MISC 1 Device by Does not apply route 4 (four) times daily -  before meals and at bedtime. relion brand  . rosuvastatin (CRESTOR) 10 MG tablet Take 10 mg by mouth every evening.   . sacubitril-valsartan (ENTRESTO) 97-103 MG Take 1 tablet by mouth 2 (two) times daily.  . sertraline (ZOLOFT) 100 MG tablet Take 1 tablet (  100 mg total) by mouth daily.  . sodium chloride (OCEAN) 0.65 % SOLN nasal spray Place 1 spray into both nostrils as needed for congestion.   Marland Kitchen spironolactone (ALDACTONE) 25 MG tablet Take 0.5 tablets (12.5 mg total) by mouth daily.  Marland Kitchen sulfamethoxazole-trimethoprim (BACTRIM DS) 800-160 MG tablet Take 1 tablet by mouth 2 (two) times daily.      Review of Systems   Review of Systems  Constitutional: Negative for chills, fever and malaise/fatigue.  Cardiovascular: Positive for dyspnea on exertion. Negative for chest pain, irregular heartbeat, leg swelling, near-syncope, orthopnea, palpitations and syncope.  Respiratory: Negative for cough, shortness of breath and wheezing.   Gastrointestinal: Negative for melena, nausea and vomiting.  Genitourinary: Negative for hematuria.    Neurological: Negative for dizziness, light-headedness and weakness.   All other systems reviewed and are otherwise negative except as noted above.  Physical Exam    VS:  BP 118/70 (BP Location: Left Arm, Patient Position: Sitting, Cuff Size: Normal)   Pulse 93   Ht _0  (1.803 m)   Wt 209 lb (94.8 kg)   LMP 01/03/2013   SpO2 98%   BMI 29.15 kg/m  , BMI Body mass index is 29.15 kg/m. GEN: Well nourished, overweight, well developed, in no acute distress. HEENT: normal. Neck: Supple, no JVD, carotid bruits, or masses. Cardiac: RRR, no murmurs, rubs, or gallops. No clubbing, cyanosis, edema.  Radials/DP/PT 2+ and equal bilaterally.  Respiratory:  Respirations regular and unlabored, clear to auscultation bilaterally. GI: Soft, nontender, nondistended, BS + x 4. MS: No deformity or atrophy. Skin: Warm and dry, no rash. Neuro:  Strength and sensation are intact. Psych: Normal affect.  Assessment & Plan    1. Nonischemic cardiomyopathy/heart failure with reduced ejection fraction-03/2019 EF 25-30%.  09/10/2019 repeat echo with EF 40 to 45%.  Offered referral to cardiac rehab which politely declined.  GDMT presently includes dapagliflozin 10 mg daily, Entresto 97-103 mg BID, Spironolactone 14m QD, Coreg 279mBID.  No indication for loop diuretic at this time.  Low-sodium, heart diet encouraged.  Stable NYHA II symptoms  2. HTN- BP well controlled. Continue current antihypertensive regimen.   3. Preoperative cardiovascular clearance - Upcoming cystoscopy/ureteroscopy with Dr. SnDiamantina Providence Known LBBB on EKG. L/RHC 04/08/2019 with no evidence of coronary artery disease.  Recent echo with improving LVEF.  No indication for further ischemic evaluation at this time.  Based on current ACC/AHA guidelines she is deemed acceptable risk for the planned procedure.  4. DM2- Continue to follow with PCP.  Disposition: Follow up in 2 month(s) with Dr. AgGaren Lahr APP   CaLoel DubonnetNP 09/13/2019,  1:03 PM

## 2019-09-13 NOTE — Telephone Encounter (Signed)
Rejection Reason - Patient did not respond" [facility] said 1 day ago

## 2019-09-18 ENCOUNTER — Telehealth: Payer: Self-pay | Admitting: Internal Medicine

## 2019-09-18 NOTE — Telephone Encounter (Signed)
Faxed to [employer] office notes request for last office visit faxed on 09-18-19

## 2019-09-25 ENCOUNTER — Other Ambulatory Visit: Payer: Self-pay

## 2019-09-25 ENCOUNTER — Other Ambulatory Visit
Admission: RE | Admit: 2019-09-25 | Discharge: 2019-09-25 | Disposition: A | Discharge status: Home / Self Care | Payer: Medicare Other | Source: Ambulatory Visit | Attending: Urology | Admitting: Urology

## 2019-09-25 DIAGNOSIS — Z20822 Contact with and (suspected) exposure to covid-19: Secondary | ICD-10-CM | POA: Insufficient documentation

## 2019-09-25 DIAGNOSIS — Z01812 Encounter for preprocedural laboratory examination: Secondary | ICD-10-CM | POA: Diagnosis not present

## 2019-09-25 LAB — SARS CORONAVIRUS 2 (TAT 6-24 HRS): SARS Coronavirus 2: NEGATIVE

## 2019-09-27 ENCOUNTER — Ambulatory Visit: Payer: Medicare Other | Admitting: Anesthesiology

## 2019-09-27 ENCOUNTER — Encounter: Payer: Self-pay | Admitting: Urology

## 2019-09-27 ENCOUNTER — Encounter
Admission: RE | Disposition: A | Discharge status: Home / Self Care | Payer: Self-pay | Source: Home / Self Care | Attending: Urology

## 2019-09-27 ENCOUNTER — Ambulatory Visit: Payer: Medicare Other

## 2019-09-27 ENCOUNTER — Ambulatory Visit
Admission: RE | Admit: 2019-09-27 | Discharge: 2019-09-27 | Disposition: A | Discharge status: Home / Self Care | Payer: Medicare Other | Attending: Urology | Admitting: Urology

## 2019-09-27 DIAGNOSIS — N132 Hydronephrosis with renal and ureteral calculous obstruction: Secondary | ICD-10-CM | POA: Diagnosis not present

## 2019-09-27 DIAGNOSIS — Z8249 Family history of ischemic heart disease and other diseases of the circulatory system: Secondary | ICD-10-CM | POA: Insufficient documentation

## 2019-09-27 DIAGNOSIS — Z833 Family history of diabetes mellitus: Secondary | ICD-10-CM | POA: Diagnosis not present

## 2019-09-27 DIAGNOSIS — Z87891 Personal history of nicotine dependence: Secondary | ICD-10-CM | POA: Insufficient documentation

## 2019-09-27 DIAGNOSIS — I130 Hypertensive heart and chronic kidney disease with heart failure and stage 1 through stage 4 chronic kidney disease, or unspecified chronic kidney disease: Secondary | ICD-10-CM | POA: Diagnosis not present

## 2019-09-27 DIAGNOSIS — I509 Heart failure, unspecified: Secondary | ICD-10-CM | POA: Diagnosis not present

## 2019-09-27 DIAGNOSIS — I10 Essential (primary) hypertension: Secondary | ICD-10-CM | POA: Diagnosis not present

## 2019-09-27 DIAGNOSIS — N200 Calculus of kidney: Secondary | ICD-10-CM | POA: Diagnosis not present

## 2019-09-27 DIAGNOSIS — E1122 Type 2 diabetes mellitus with diabetic chronic kidney disease: Secondary | ICD-10-CM | POA: Diagnosis not present

## 2019-09-27 DIAGNOSIS — E119 Type 2 diabetes mellitus without complications: Secondary | ICD-10-CM | POA: Diagnosis not present

## 2019-09-27 DIAGNOSIS — N183 Chronic kidney disease, stage 3 unspecified: Secondary | ICD-10-CM | POA: Diagnosis not present

## 2019-09-27 DIAGNOSIS — E782 Mixed hyperlipidemia: Secondary | ICD-10-CM | POA: Diagnosis not present

## 2019-09-27 HISTORY — PX: CYSTOSCOPY W/ RETROGRADES: SHX1426

## 2019-09-27 HISTORY — PX: CYSTOSCOPY/URETEROSCOPY/HOLMIUM LASER/STENT PLACEMENT: SHX6546

## 2019-09-27 LAB — GLUCOSE, CAPILLARY
Glucose-Capillary: 102 mg/dL — ABNORMAL HIGH (ref 70–99)
Glucose-Capillary: 127 mg/dL — ABNORMAL HIGH (ref 70–99)

## 2019-09-27 SURGERY — CYSTOSCOPY/URETEROSCOPY/HOLMIUM LASER/STENT PLACEMENT
Anesthesia: General | Laterality: Right

## 2019-09-27 MED ORDER — EPHEDRINE SULFATE 50 MG/ML IJ SOLN
INTRAMUSCULAR | Status: DC | PRN
  Administered 2019-09-27: 15 mg via INTRAVENOUS
  Administered 2019-09-27: 10 mg via INTRAVENOUS

## 2019-09-27 MED ORDER — ORAL CARE MOUTH RINSE: 15.0000 mL | Freq: Once | OROMUCOSAL | Status: AC

## 2019-09-27 MED ORDER — KETOROLAC TROMETHAMINE 30 MG/ML IJ SOLN
INTRAMUSCULAR | Status: DC | PRN
  Administered 2019-09-27: 15 mg via INTRAVENOUS

## 2019-09-27 MED ORDER — FENTANYL CITRATE (PF) 100 MCG/2ML IJ SOLN
INTRAMUSCULAR | Status: DC | PRN
Start: 2019-09-27 — End: 2019-09-27
  Administered 2019-09-27: 100 ug via INTRAVENOUS

## 2019-09-27 MED ORDER — FENTANYL CITRATE (PF) 100 MCG/2ML IJ SOLN
INTRAMUSCULAR | Status: AC
  Filled 2019-09-27: qty 2

## 2019-09-27 MED ORDER — ONDANSETRON HCL 4 MG/2ML IJ SOLN
INTRAMUSCULAR | Status: AC
  Filled 2019-09-27: qty 2

## 2019-09-27 MED ORDER — LIDOCAINE HCL (CARDIAC) PF 100 MG/5ML IV SOSY
PREFILLED_SYRINGE | INTRAVENOUS | Status: DC | PRN
  Administered 2019-09-27: 50 mg via INTRAVENOUS

## 2019-09-27 MED ORDER — FENTANYL CITRATE (PF) 100 MCG/2ML IJ SOLN: 25.0000 ug | INTRAMUSCULAR | Status: DC | PRN

## 2019-09-27 MED ORDER — DEXAMETHASONE SODIUM PHOSPHATE 10 MG/ML IJ SOLN
INTRAMUSCULAR | Status: AC
  Filled 2019-09-27: qty 1

## 2019-09-27 MED ORDER — CIPROFLOXACIN IN D5W 400 MG/200ML IV SOLN
INTRAVENOUS | Status: AC
  Filled 2019-09-27: qty 200

## 2019-09-27 MED ORDER — KETOROLAC TROMETHAMINE 30 MG/ML IJ SOLN
INTRAMUSCULAR | Status: AC
  Filled 2019-09-27: qty 1

## 2019-09-27 MED ORDER — BELLADONNA ALKALOIDS-OPIUM 16.2-60 MG RE SUPP
RECTAL | Status: DC | PRN
Start: 2019-09-27 — End: 2019-09-27
  Administered 2019-09-27: 1 via RECTAL

## 2019-09-27 MED ORDER — HYDROCODONE-ACETAMINOPHEN 5-325 MG PO TABS
1.0000 | ORAL_TABLET | ORAL | 0 refills | Status: AC | PRN
Start: 2019-09-27 — End: 2019-10-02

## 2019-09-27 MED ORDER — TAMSULOSIN HCL 0.4 MG PO CAPS: 0.4000 mg | ORAL_CAPSULE | Freq: Every day | ORAL | 0 refills | Status: DC

## 2019-09-27 MED ORDER — CARVEDILOL 25 MG PO TABS
25.0000 mg | ORAL_TABLET | Freq: Once | ORAL | Status: AC
  Administered 2019-09-27: 25 mg via ORAL

## 2019-09-27 MED ORDER — CARVEDILOL 25 MG PO TABS
ORAL_TABLET | ORAL | Status: AC
  Filled 2019-09-27: qty 1

## 2019-09-27 MED ORDER — CHLORHEXIDINE GLUCONATE 0.12 % MT SOLN
15.0000 mL | Freq: Once | OROMUCOSAL | Status: AC
  Administered 2019-09-27: 15 mL via OROMUCOSAL

## 2019-09-27 MED ORDER — DEXAMETHASONE SODIUM PHOSPHATE 10 MG/ML IJ SOLN
INTRAMUSCULAR | Status: DC | PRN
  Administered 2019-09-27: 10 mg via INTRAVENOUS

## 2019-09-27 MED ORDER — SUGAMMADEX SODIUM 500 MG/5ML IV SOLN
INTRAVENOUS | Status: DC | PRN
  Administered 2019-09-27: 200 mg via INTRAVENOUS

## 2019-09-27 MED ORDER — PHENYLEPHRINE HCL (PRESSORS) 10 MG/ML IV SOLN
INTRAVENOUS | Status: AC
  Filled 2019-09-27: qty 1

## 2019-09-27 MED ORDER — SUCCINYLCHOLINE CHLORIDE 20 MG/ML IJ SOLN
INTRAMUSCULAR | Status: DC | PRN
  Administered 2019-09-27: 100 mg via INTRAVENOUS

## 2019-09-27 MED ORDER — MIDAZOLAM HCL 2 MG/2ML IJ SOLN
INTRAMUSCULAR | Status: AC
  Filled 2019-09-27: qty 2

## 2019-09-27 MED ORDER — PROPOFOL 10 MG/ML IV BOLUS
INTRAVENOUS | Status: AC
  Filled 2019-09-27: qty 20

## 2019-09-27 MED ORDER — PROPOFOL 10 MG/ML IV BOLUS
INTRAVENOUS | Status: DC | PRN
  Administered 2019-09-27: 50 mg via INTRAVENOUS

## 2019-09-27 MED ORDER — FAMOTIDINE 20 MG PO TABS
20.0000 mg | ORAL_TABLET | Freq: Once | ORAL | Status: AC
  Administered 2019-09-27: 20 mg via ORAL

## 2019-09-27 MED ORDER — ONDANSETRON HCL 4 MG/2ML IJ SOLN
INTRAMUSCULAR | Status: DC | PRN
  Administered 2019-09-27: 4 mg via INTRAVENOUS

## 2019-09-27 MED ORDER — CHLORHEXIDINE GLUCONATE 0.12 % MT SOLN
OROMUCOSAL | Status: AC
  Filled 2019-09-27: qty 15

## 2019-09-27 MED ORDER — BELLADONNA ALKALOIDS-OPIUM 16.2-60 MG RE SUPP
RECTAL | Status: AC
Start: 2019-09-27 — End: ?
  Filled 2019-09-27: qty 1

## 2019-09-27 MED ORDER — MIDAZOLAM HCL 2 MG/2ML IJ SOLN
INTRAMUSCULAR | Status: DC | PRN
  Administered 2019-09-27: 2 mg via INTRAVENOUS

## 2019-09-27 MED ORDER — NOREPINEPHRINE 4 MG/250ML-% IV SOLN
INTRAVENOUS | Status: DC | PRN
  Administered 2019-09-27: .05 ug/kg/min via INTRAVENOUS

## 2019-09-27 MED ORDER — SODIUM CHLORIDE 0.9 % IV SOLN: INTRAVENOUS | Status: DC

## 2019-09-27 MED ORDER — ROCURONIUM BROMIDE 10 MG/ML (PF) SYRINGE
PREFILLED_SYRINGE | INTRAVENOUS | Status: AC
  Filled 2019-09-27: qty 10

## 2019-09-27 MED ORDER — OXYBUTYNIN CHLORIDE ER 10 MG PO TB24: 10.0000 mg | ORAL_TABLET | Freq: Every day | ORAL | 0 refills | Status: AC | PRN

## 2019-09-27 MED ORDER — ONDANSETRON HCL 4 MG/2ML IJ SOLN: 4.0000 mg | Freq: Once | INTRAMUSCULAR | Status: DC | PRN

## 2019-09-27 MED ORDER — CIPROFLOXACIN IN D5W 400 MG/200ML IV SOLN
INTRAVENOUS | Status: DC | PRN
  Administered 2019-09-27: 400 mg via INTRAVENOUS

## 2019-09-27 MED ORDER — SULFAMETHOXAZOLE-TRIMETHOPRIM 800-160 MG PO TABS: 1.0000 | ORAL_TABLET | Freq: Every day | ORAL | 0 refills | Status: DC

## 2019-09-27 MED ORDER — ROCURONIUM BROMIDE 100 MG/10ML IV SOLN
INTRAVENOUS | Status: DC | PRN
  Administered 2019-09-27: 50 mg via INTRAVENOUS

## 2019-09-27 MED ORDER — PHENYLEPHRINE HCL-NACL 20-0.9 MG/250ML-% IV SOLN
INTRAVENOUS | Status: DC | PRN
  Administered 2019-09-27: 55 ug/min via INTRAVENOUS

## 2019-09-27 MED ORDER — LIDOCAINE HCL (PF) 2 % IJ SOLN
INTRAMUSCULAR | Status: AC
  Filled 2019-09-27: qty 5

## 2019-09-27 MED ORDER — PHENYLEPHRINE HCL (PRESSORS) 10 MG/ML IV SOLN
INTRAVENOUS | Status: DC | PRN
  Administered 2019-09-27: 200 ug via INTRAVENOUS

## 2019-09-27 MED ORDER — FAMOTIDINE 20 MG PO TABS
ORAL_TABLET | ORAL | Status: AC
  Filled 2019-09-27: qty 1

## 2019-09-27 SURGICAL SUPPLY — 35 items
BAG DRAIN CYSTO-URO LG1000N (MISCELLANEOUS) ×3 IMPLANT
BRUSH SCRUB EZ 1% IODOPHOR (MISCELLANEOUS) ×3 IMPLANT
CATH URET FLEX-TIP 2 LUMEN 10F (CATHETERS) ×3 IMPLANT
CATH URETL 5X70 OPEN END (CATHETERS) ×3 IMPLANT
CNTNR SPEC 2.5X3XGRAD LEK (MISCELLANEOUS)
CONRAY 43 FOR UROLOGY 50M (MISCELLANEOUS) ×3 IMPLANT
CONT SPEC 4OZ STER OR WHT (MISCELLANEOUS)
CONT SPEC 4OZ STRL OR WHT (MISCELLANEOUS)
CONTAINER SPEC 2.5X3XGRAD LEK (MISCELLANEOUS) IMPLANT
DRAPE UTILITY 15X26 TOWEL STRL (DRAPES) ×3 IMPLANT
FIBER LASER TRAC TIP (UROLOGICAL SUPPLIES) IMPLANT
FIBER LASER TRACTIP 200 (UROLOGICAL SUPPLIES) ×3 IMPLANT
GLOVE BIOGEL PI IND STRL 7.5 (GLOVE) ×2 IMPLANT
GLOVE BIOGEL PI INDICATOR 7.5 (GLOVE) ×1
GOWN STRL REUS W/ TWL LRG LVL3 (GOWN DISPOSABLE) ×2 IMPLANT
GOWN STRL REUS W/ TWL XL LVL3 (GOWN DISPOSABLE) ×2 IMPLANT
GOWN STRL REUS W/TWL LRG LVL3 (GOWN DISPOSABLE) ×3
GOWN STRL REUS W/TWL XL LVL3 (GOWN DISPOSABLE) ×3
GUIDEWIRE STR DUAL SENSOR (WIRE) ×3 IMPLANT
INFUSOR MANOMETER BAG 3000ML (MISCELLANEOUS) ×3 IMPLANT
INTRODUCER DILATOR DOUBLE (INTRODUCER) IMPLANT
KIT TURNOVER CYSTO (KITS) ×3 IMPLANT
PACK CYSTO AR (MISCELLANEOUS) ×3 IMPLANT
SET CYSTO W/LG BORE CLAMP LF (SET/KITS/TRAYS/PACK) ×3 IMPLANT
SHEATH URETERAL 12FRX35CM (MISCELLANEOUS) ×3 IMPLANT
SOL .9 NS 3000ML IRR  AL (IV SOLUTION) ×1
SOL .9 NS 3000ML IRR AL (IV SOLUTION) ×2
SOL .9 NS 3000ML IRR UROMATIC (IV SOLUTION) ×2 IMPLANT
STENT URET 6FRX24 CONTOUR (STENTS) IMPLANT
STENT URET 6FRX26 CONTOUR (STENTS) IMPLANT
STENT URET 6FRX28 CONTOUR (STENTS) ×3 IMPLANT
SURGILUBE 2OZ TUBE FLIPTOP (MISCELLANEOUS) ×3 IMPLANT
SYR 10ML LL (SYRINGE) ×3 IMPLANT
VALVE UROSEAL ADJ ENDO (VALVE) ×3 IMPLANT
WATER STERILE IRR 1000ML POUR (IV SOLUTION) ×3 IMPLANT

## 2019-09-27 NOTE — Anesthesia Preprocedure Evaluation (Addendum)
Anesthesia Evaluation  Patient identified by MRN, date of birth, ID band Patient awake    Reviewed: Allergy & Precautions, H&P , NPO status , Patient's Chart, lab work & pertinent test results, reviewed documented beta blocker date and time   Airway Mallampati: III  TM Distance: >3 FB Neck ROM: full    Dental  (+) Teeth Intact, Poor Dentition   Pulmonary shortness of breath and with exertion, asthma , former smoker,  Chronic stridor and without change. ja          Cardiovascular Exercise Tolerance: Poor hypertension, On Medications +CHF  Normal cardiovascular exam Rhythm:regular Rate:Normal  Non ischemic cardiomyopathy, EF 25-30%   Neuro/Psych PSYCHIATRIC DISORDERS Anxiety Depression  Neuromuscular disease    GI/Hepatic Neg liver ROS, hiatal hernia,   Endo/Other  negative endocrine ROSdiabetes  Renal/GU Renal disease  negative genitourinary   Musculoskeletal   Abdominal   Peds  Hematology negative hematology ROS (+)   Anesthesia Other Findings Past Medical History: No date: Anxiety No date: Asthma No date: Depression No date: Diabetes mellitus without complication ([facility]) No date: History of kidney stones No date: Hypertension No date: Kidney stones No date: Vertigo     Comment:  when laying flat No date: Vocal cord dysfunction     Comment:  makes it hard for patient to talk and breathe Past Surgical History: No date: CARDIAC CATHETERIZATION No date: LITHOTRIPSY 04/08/2019: RIGHT/LEFT HEART CATH AND CORONARY ANGIOGRAPHY; Bilateral     Comment:  Procedure: RIGHT/LEFT HEART CATH AND CORONARY               ANGIOGRAPHY;  Surgeon: Wellington Hampshire, MD;  Location:               [facility];  Service: Cardiovascular;                Laterality: Bilateral;   Reproductive/Obstetrics negative OB ROS                           Anesthesia Physical Anesthesia Plan  ASA:  III  Anesthesia Plan: General ETT   Post-op Pain Management:    Induction:   PONV Risk Score and Plan: 3  Airway Management Planned:   Additional Equipment:   Intra-op Plan:   Post-operative Plan:   Informed Consent: I have reviewed the patients History and Physical, chart, labs and discussed the procedure including the risks, benefits and alternatives for the proposed anesthesia with the patient or authorized representative who has indicated his/her understanding and acceptance.     Dental Advisory Given  Plan Discussed with: CRNA  Anesthesia Plan Comments: (Pt with multinodular goiter and recent fine needle aspiration.  She furthermore, has a chronic stridor issue that has been evaluated by speech therapy which has been stable in nature.  She denies recent changes with her pulmonary status and has a previous CT showing no evidence of respiratory or tracheal involvement.  OK for above.  ja)       Anesthesia Quick Evaluation

## 2019-09-27 NOTE — Anesthesia Procedure Notes (Signed)
Procedure Name: Intubation Performed by: Gaynelle Cage, CRNA Pre-anesthesia Checklist: Patient identified, Emergency Drugs available, Suction available and Patient being monitored Patient Re-evaluated:Patient Re-evaluated prior to induction Oxygen Delivery Method: Circle system utilized Preoxygenation: Pre-oxygenation with 100% oxygen Induction Type: IV induction Laryngoscope Size: McGraph and 3 Grade View: Grade I Tube type: Oral Tube size: 6.5 mm Number of attempts: 1 Airway Equipment and Method: Stylet Placement Confirmation: ETT inserted through vocal cords under direct vision,  positive ETCO2 and breath sounds checked- equal and bilateral Secured at: 21 cm Tube secured with: Tape Dental Injury: Teeth and Oropharynx as per pre-operative assessment

## 2019-09-27 NOTE — Op Note (Signed)
Date of procedure: 09/27/19  Preoperative diagnosis:  1. Left renal stone  Postoperative diagnosis:  1. Same  Procedure: 1. Cystoscopy, bilateral retrograde pyelograms with intraoperative interpretation, left ureteroscopy, laser lithotripsy, stent placement  Surgeon: Nickolas Madrid, MD  Anesthesia: General  Complications: None  Intraoperative findings:  1.  Normal cystoscopy, normal right retrograde pyelogram 2.  Large 2 cm left renal stone, fragmented to dust 3.  Uncomplicated stent placement  EBL: Minimal  Specimens: Stone for analysis  Drains: Left 6 French by 28 cm ureteral stent  Indication: Jennelle Pinkstaff is a 59 y.o. patient with prior right ureteral stone passage and a large 2 cm left renal stone.  After reviewing the management options for treatment, they elected to proceed with the above surgical procedure(s). We have discussed the potential benefits and risks of the procedure, side effects of the proposed treatment, the likelihood of the patient achieving the goals of the procedure, and any potential problems that might occur during the procedure or recuperation. Informed consent has been obtained.  Description of procedure:  The patient was taken to the operating room and general anesthesia was induced. SCDs were placed for DVT prophylaxis. The patient was placed in the dorsal lithotomy position, prepped and draped in the usual sterile fashion, and preoperative antibiotics(Cipro) were administered. A preoperative time-out was performed.   A 21 French rigid cystoscope was used to intubate the urethra and thorough cystoscopy was performed.  The bladder was grossly normal.  A retrograde pyelogram was performed with a ureteral access catheter on the right side which showed no hydronephrosis or filling defects.  Retrograde pyelogram was then performed on the left side which showed mild hydronephrosis and a large filling defect in the renal pelvis.  A sensor wire was  advanced up into the kidney, and a dual-lumen ureteral access catheter was used to add a second safety wire.  A 12/14 French ureteral access sheath was then gently advanced over the wire under fluoroscopic vision.  A single channel flexible ureteroscope was advanced through the sheath up into the kidney.  There was a large yellow 2 cm stone.  Using the 200 m laser fiber on settings of 0.5 J and 40 Hz the stone was methodically fragmented to dust.  This took 75 minutes.  Thorough pyeloscopy revealed no residual fragments at the conclusion of the case.  Retrograde pyelogram was performed from the proximal ureter and showed no filling defects or extravasation.  Careful pullback ureteroscopy revealed no ureteral fragments or ureteral injury.  A 6 French by 28 cm ureteral stent was placed fluoroscopically over the stent with a curl in the midpole, as well as in the bladder.  The bladder was drained and a belladonna suppository was placed.  Disposition: Stable to PACU  Plan: Follow-up in 2 weeks for stent removal, KUB prior Bactrim prophylaxis while stent in place Follow-up stone analysis  Nickolas Madrid, MD

## 2019-09-27 NOTE — H&P (Signed)
   09/27/19 2:33 PM   [name] [birth year] [id number]  CC: left renal stone  HPI: he was seen by her PCP and late July with recurrent episodes of microscopic hematuria and ultimately a CT abdomen pelvis without contrast was performed on 07/26/2019.  This showed a 5 mm right proximal ureteral stone, additional 6 mm right nonobstructing lower pole stone, right hydronephrosis, and on my review left dilated collecting system and a large 2.2 cm left renal pelvis stone.  She reportedly passed her small right-sided stone a few days later, and she denies any recurrent right-sided or left-sided flank pain.  She has had some intermittent hematuria during her stone passage.  She denies any fevers or chills, dysuria, urgency/frequency.  She denies any history of UTIs.  She has a history of shockwave lithotripsy with [facility] in [place], but she is unsure what side this was on.  This was at least a few years ago.  She has a distant smoking history when she was a teenager, but quit over 20 years ago.  She denies any other carcinogenic exposures.   PMH: Past Medical History:  Diagnosis Date  . Anxiety   . Asthma   . Depression   . Diabetes mellitus without complication ([place])   . History of kidney stones   . Hypertension   . Kidney stones   . Vertigo    when laying flat  . Vocal cord dysfunction    makes it hard for patient to talk and breathe    Surgical History: Past Surgical History:  Procedure Laterality Date  . CARDIAC CATHETERIZATION    . LITHOTRIPSY    . RIGHT/LEFT HEART CATH AND CORONARY ANGIOGRAPHY Bilateral 04/08/2019   Procedure: RIGHT/LEFT HEART CATH AND CORONARY ANGIOGRAPHY;  Surgeon: Wellington Hampshire, MD;  Location: [facility];  Service: Cardiovascular;  Laterality: Bilateral;      Family History: Family History  Problem Relation Age of Onset  . Hypertension Mother   . Diabetes Mother   . Hypertension Father   . Diabetes Father     Social History:   reports that she quit smoking about 21 years ago. She has a 10.00 pack-year smoking history. She has never used smokeless tobacco. She reports previous alcohol use.  Drug: Marijuana.  Physical Exam: BP (!) 165/105   Pulse 92   Temp 98.8 F (37.1 C) (Oral)   Resp 18   Ht 5\' 11"  (1.803 m)   Wt 94.8 kg   LMP 01/03/2013   SpO2 100%   BMI 29.15 kg/m    Constitutional:  Alert and oriented, No acute distress. Cardiovascular: RRR Respiratory: cta B/L GI: Abdomen is soft, nontender, nondistended, no abdominal masses  Assessment & Plan:   59 year old female with spontaneously passed right ureteral stone, and 2 cm left renal stone here today for management of her left-sided stone and right retrograde pyelogram to confirm stone passage.  We specifically discussed the risks ureteroscopy including bleeding, infection/sepsis, stent related symptoms including flank pain/urgency/frequency/incontinence/dysuria, ureteral injury, inability to access stone, or need for staged or additional procedures.  Cystoscopy, right retrograde pyelogram, left ureteroscopy, laser lithotripsy, stent placement  Nickolas Madrid, MD 09/27/2019  [facility] [address], [place], [place] [address] [phone]

## 2019-09-27 NOTE — Transfer of Care (Signed)
Immediate Anesthesia Transfer of Care Note  Patient: Nancy Barajas  Procedure(s) Performed: CYSTOSCOPY/URETEROSCOPY/HOLMIUM LASER/STENT PLACEMENT (Left ) CYSTOSCOPY WITH RETROGRADE PYELOGRAM (Right )  Patient Location: PACU  Anesthesia Type:General  Level of Consciousness: sedated  Airway & Oxygen Therapy: Patient Spontanous Breathing and Patient connected to face mask oxygen  Post-op Assessment: Report given to RN and Post -op Vital signs reviewed and stable  Post vital signs: Reviewed and stable  Last Vitals:  Vitals Value Taken Time  BP 153/86 09/27/19 1638  Temp    Pulse 82 09/27/19 1640  Resp 17 09/27/19 1640  SpO2 100 % 09/27/19 1640  Vitals shown include unvalidated device data.  Last Pain:  Vitals:   09/27/19 1638  TempSrc:   PainSc: (P) Asleep         Complications: No complications documented.

## 2019-09-27 NOTE — Discharge Instructions (Signed)
AMBULATORY SURGERY  °DISCHARGE INSTRUCTIONS ° ° °1) The drugs that you were given will stay in your system until tomorrow so for the next 24 hours you should not: ° °A) Drive an automobile °B) Make any legal decisions °C) Drink any alcoholic beverage ° ° °2) You may resume regular meals tomorrow.  Today it is better to start with liquids and gradually work up to solid foods. ° °You may eat anything you prefer, but it is better to start with liquids, then soup and crackers, and gradually work up to solid foods. ° ° °3) Please notify your doctor immediately if you have any unusual bleeding, trouble breathing, redness and pain at the surgery site, drainage, fever, or pain not relieved by medication. ° ° ° °4) Additional Instructions: ° ° ° ° ° ° ° °Please contact your physician with any problems or Same Day Surgery at [phone], Monday through Friday 6 am to 4 pm, or [facility] Main number at [phone]. °

## 2019-09-29 NOTE — Anesthesia Postprocedure Evaluation (Signed)
Anesthesia Post Note  Patient: Nancy Barajas  Procedure(s) Performed: CYSTOSCOPY/URETEROSCOPY/HOLMIUM LASER/STENT PLACEMENT (Left ) CYSTOSCOPY WITH RETROGRADE PYELOGRAM (Right )  Patient location during evaluation: PACU Anesthesia Type: General Level of consciousness: awake and alert Pain management: pain level controlled Vital Signs Assessment: post-procedure vital signs reviewed and stable Respiratory status: spontaneous breathing, nonlabored ventilation and respiratory function stable Cardiovascular status: blood pressure returned to baseline and stable Postop Assessment: no apparent nausea or vomiting Anesthetic complications: no   No complications documented.   Last Vitals:  Vitals:   09/27/19 1719 09/27/19 1720  BP:  (!) 145/86  Pulse: 77   Resp: 16   Temp:    SpO2: 100%     Last Pain:  Vitals:   09/27/19 1719  TempSrc:   PainSc: 0-No pain                 Brett Canales Klaus Casteneda

## 2019-09-30 ENCOUNTER — Encounter: Payer: Self-pay | Admitting: Urology

## 2019-10-03 ENCOUNTER — Ambulatory Visit (INDEPENDENT_AMBULATORY_CARE_PROVIDER_SITE_OTHER): Payer: Medicare Other | Admitting: Psychology

## 2019-10-03 DIAGNOSIS — F418 Other specified anxiety disorders: Secondary | ICD-10-CM | POA: Diagnosis not present

## 2019-10-03 LAB — STONE ANALYSIS
Calcium Oxalate Monohydrate: 10 %
Uric Acid Calculi: 90 %
Weight Calculi: 30 mg

## 2019-10-07 ENCOUNTER — Other Ambulatory Visit: Payer: Self-pay | Admitting: *Deleted

## 2019-10-07 ENCOUNTER — Encounter: Payer: Self-pay | Admitting: *Deleted

## 2019-10-07 ENCOUNTER — Ambulatory Visit (INDEPENDENT_AMBULATORY_CARE_PROVIDER_SITE_OTHER): Payer: Medicare Other | Admitting: Pharmacist

## 2019-10-07 DIAGNOSIS — E119 Type 2 diabetes mellitus without complications: Secondary | ICD-10-CM

## 2019-10-07 DIAGNOSIS — E1122 Type 2 diabetes mellitus with diabetic chronic kidney disease: Secondary | ICD-10-CM

## 2019-10-07 DIAGNOSIS — N183 Chronic kidney disease, stage 3 unspecified: Secondary | ICD-10-CM

## 2019-10-07 DIAGNOSIS — I5020 Unspecified systolic (congestive) heart failure: Secondary | ICD-10-CM | POA: Diagnosis not present

## 2019-10-07 NOTE — Patient Outreach (Signed)
[facility]) Care Management [facility] CM Telephone Outreach  10/07/2019  Nancy Barajas 1960-03-03 [id number]  [place] Nancy Barajas, 59 y/o female referred to [facility] RN CM on April 25, 2019 by [facility] CMA for routine PCP referral for DM management and new diagnosis of CHF. Patient has had no recent unplanned hospitalizations. Patient has history including, but not limited to, HTN/ HLD; DM- type II with A1-C of 8.6 (8.6- [id number]); CKD- III; and anxiety/ depression.  A1-C on 07/10/19: 6.6(Down from 8.6in February 2021)  HIPAA/ identity verified; patient reports shehas "beendoingpretty good;" and she sounds to be in no distress throughout call today.Reports no clinical concerns and confirms that she has attended all recent provider appointments, continues to drive self.  Care Coordination outreach to [facility] Pharmacist embedded at PCP office via secure messaging through EHR: asked Pharmacist to contact patient regarding patient's ongoing reports around discrepancies between CGM and glucometer: patient unsure which value to use in administering meal-time sliding scale insulin; denies recent signs/ symptoms hypoglycemic episodes.  Provided example this morning that CGM value was 129 fasting; glucometer (fingerstick) value was 160; patient unsure whether she should take sliding scale insulin because the 2 values are different.  Received great news from cardiologist at post- recent ECHO office visit:  EF has now improved from 25-30% to 40-45% with current medication treatment; reports weights staying steady "203 lbs," and denies (new/ different) issues with breathing, weight gain, edema.  Patient denies further issues, concerns, or problems today. Iconfirmed that patient hasmy direct phone number, the main [name] office phone number, and the [facility] CM 24-hour nurse advice phone number should issues arise prior to next scheduled [name] outreachnext month.  Will continue to hold off on health coach referral for now, as patientcontinues to require care coordination and ongoing reinforcement of self health management of multiple chronic disease conditions; will re-visit at time of next outreach.  Encouraged patient to contact me directly if needs, questions, issues, or concerns arise prior to next scheduled outreach; patient agreed to do so.  Goals Addressed              This Visit's Progress   .  [name]: Learn More About My Health        Follow Up Date 11/08/2019    - tell my story and reason for my visit - make a list of questions - ask questions - repeat what I heard to make sure I understand - bring a list of my medicines to the visit - speak up when I don't understand    Why is this important?   The best way to learn about your health and care is by talking to the doctor and nurse.  They will answer your questions and give you information in the way that you like best.    Notes:     .  [name]: Make and Keep All Appointments        Follow Up Date 11/08/2019    - keep a calendar with appointment dates    Why is this important?   Part of staying healthy is seeing the doctor for follow-up care.  If you forget your appointments, there are some things you can do to stay on track.    Notes:   Patient reports continues to drive self to appointments Appointments reviewed with patient who verbalizes plans to attend all as scheduled    .  [name]: Monitor and Manage My Blood Sugar  Follow Up Date 11/08/2019    - check blood sugar at prescribed times - check blood sugar if I feel it is too high or too low - enter blood sugar readings and medication or insulin into daily log - take the blood sugar log to all doctor visits    Why is this important?   Checking your blood sugar at home helps to keep it from getting very high or very low.  Writing the results in a diary or log helps the doctor know how to care for you.    Your blood sugar log should have the time, date and the results.  Also, write down the amount of insulin or other medicine that you take.  Other information, like what you ate, exercise done and how you were feeling, will also be helpful.     Notes:   10/07/19: reviewed with patient all recent blood sugar values from both CGM and glucometer    .  COMPLETED: [facility]CM: "I want to keep moving in the right direction to get my blood sugars under control" (pt-stated)   On track     [place] (see longtitudinal plan of care for additional care plan information)  Objective:  Lab Results  Component Value Date   HGBA1C 6.6 (H) 07/10/2019 .   Lab Results  Component Value Date   CREATININE 1.21 (H) 05/14/2019   CREATININE 1.24 (H) 04/02/2019   CREATININE 1.12 02/20/2019 .   Marland Kitchen No results found for: EGFR  Current Barriers:  Marland Kitchen Knowledge Deficits related to basic Diabetes pathophysiology and self care/management . Knowledge Deficits related to medications used for management of diabetes  Case Manager Clinical Goal(s):  Over the next 90 days, patient will demonstrate improved adherence to prescribed treatment plan for diabetes self care/management as evidenced by:  Marland Kitchen Verbalize adherence to ADA/ carb modified diet within the next 45 days 08/13/19: on track; goal re-established 09/11/19: on track; goal maintained . Verbalize adherence to prescribed medication regimen within the next 30 days  08/13/19: on track; goal re-established; see narrative re: spironolactone dosing change 09/11/19: on track; goal maintained . Verbalize signs/ symptoms and corresponding action plan for hypoglycemia within the next 30 days 08/13/19: goal met; patient able to verbalize appropriate action plan for low blood sugar . 09/11/19: on track; goal maintained  Interventions:  . Provided education to patient about basic DM disease process . Reviewed medications with patient and discussed importance of medication  adherence- discussed importance of eating when she takes sliding scale insulin and instructed to not take if she is not planning on eating right away . Discussed plans with patient for ongoing care management follow up and confirmed patient has direct contact information for care management team . Provided patient with written educational materials related to hypo and hyperglycemia and importance of correct treatment: sent Living well With Diabetes booklet; using teach back method, provided verbal education and reviewed signs/ symptoms and corresponding action plan for episodes hypoglycemia . Reviewed scheduled/upcoming provider appointments including: PCP office visit 07/10/19; upcoming scheduled appointments . Advised patient, providing education and rationale, to continue checking cbg 3-4 times per day using CGM and record, calling care providers/ PCP for findings outside established parameters.  (consistently lower than 70 fasting; consistently higher than 180 after eating) . Instructed patient to verify any low blood sugar readings (without symptoms) from CGM against actual glucometer/ finger prick- and to record same on paper . Sent [facility] Pharamcist currently involved in patient's care notes from today's  call as FYI for care collaboration  08/13/19 -- reviewed recent blood sugars, blood pressures and weights at home with patient -- completed review of recent provider appointments and changes to medication dosing -- care coordination outreach completed with patient's outpatient pharmacy regarding new dosing of spironolactone -- advised patient to obtain pill cutter for home use and to begin taking 12.5 mg QD as prescribed -- placed care coordination note to [facility] Pharmacist embedded at PCP office to notify in follow up to recent medication changes -- reviewed and reinforced previously provided education around CHF, blood sugar and blood pressure management -- sent quarterly update to patient's  PCP  09/11/19: -- reviewed recent and upcoming provider appointments; confirmed patient is aware of all and has attended/ plans to attend all as scheduled -- confirmed no recent changes to medications; confirmed patient has been taking spironolactone as ordered and is now receiving pills from pharmacy that can be slit to obtain prescribed dose -- reviewed recent blood sugars and weights at home  -- discussed overall clinical condition and confirmed she has no current clinical concerns  Patient Self Care Activities:  . Self administers oral medications as prescribed . Self administers insulin as prescribed . Self administers injectable DM medication as prescribed . Attends all scheduled provider appointments . Checks blood sugars as prescribed and utilize hyper and hypoglycemia protocol as needed  Initial goal documentation 08/13/19: update: patient on track; goals re-established 09/11/19: update: on track; goals maintained 10/07/19: update: on track; resolving due to duplicate goal  Oneta Rack, RN, BSN, [employer] Coordinator [place] Management  [phone]         Oneta Rack, RN, BSN, [place] Coordinator [facility] Care Management  [phone]

## 2019-10-07 NOTE — Patient Instructions (Addendum)
Ms. Cirrincione,   It was GREAT talking with you today!  1) You don't need to do both fingersticks AND scan the sensor. These numbers may not always match up, and that is expected. There is "lag time" between changes in blood sugar levels in the blood and in the interstial fluid (what the sensor reads)  2) Turn off the low blood sugar <70 alarm. I am not concerned with occasional low readings in the high 60s overnight. This is really a "normal" blood sugar reading, since you aren't taking any medications that specifically push your sugars too low overnight  3) We are working on getting a blood pressure machine mailed to you  4) I recommended reaching out to the [facility] [facility] [facility]) to discuss alternative options (like a Medicare Advantage plan) for 2022.   Call us toll-free at [phone] (Monday through Friday from 8am to 5pm)  (Counseling By Appointment Only) - local office [facility] (Ask for [place])     [place]. [place]  [address] [phone]     The [facility] [place]) counsels Medicare beneficiaries and caregivers about Medicare, Medicare supplements, Medicare Advantage, Medicare Part D, and long-term care insurance. The counselors on our toll-free line offer free and unbiased information regarding Medicare health care products. We also help people recognize and prevent Medicare billing errors and possible fraud and abuse through the [facility] Senior [employer] Program.  [facility] counselors are not licensed insurance agents, and they do not sell or endorse any product, plan, or company.  If you have questions about your specific plan, we encourage you to contact your insurance agent or the insurance company.   Visit Information  Goals Addressed              This Visit's Progress     Patient Stated   .  "I want to be healthy (pt-stated)        CARE  PLAN ENTRY (see longtitudinal plan of care for additional care plan information)  Current Barriers:  . Financial, social, and community barriers:  o Worried about discrepancies between fingersticks and CGM scans. Reported an episode to RN CM where CGM value was 129 and fingerstick value was 160. Notes that her "anxiety causes me to check more often than I need to" o Upcoming thyroid biopsy w/ Dr. Honor Junes.  . Diabetes: conontrolled; complicated by CHF, CKD; most recent A1c 6.6% . Current antihyperglycemic regimen: Trulicity 3 mg weekly; Farxiga 10 mg daily, Humalog sliding scale, however, infrequent use. Notes last time she used it was for correcting a reading >180. Reports the low glucose alarm wakes her up overnight sometimes with readings 65-70s. No pattern of taking insulin before these episodes.  o Previously on metformin, stopped d/t kidney function decline . Cardiovascular risk reduction (recent dx HFrEF, EF 25-30%); follows w/ Dr. Garen Lah o Current hypertensive regimen: carvedilol 25 mg BID, spironolactone 12.5 mg daily, Entresto 97/103 mg BID - BP readings at goal ~120/70s at home, however, wrist machine often reads "error" o Current hyperlipidemia regimen: rosuvastatin 10 mg; LDL at goal <70 . Depression/anxiety: follows w/ Dr. Shella Spearing w/ sertraline 100 mg daily; has not been using trazodone PRN script  . Kidney stones: s/p stenting. Appt to remove stent later this week. Completing ABX script and tamsulosin  Pharmacist Clinical Goal(s):  Marland Kitchen Over the next 90 days, patient with work with PharmD and primary care  provider to address optimized glycemic management  Interventions: . Comprehensive medication review performed, medication list updated in electronic medical record . Inter-disciplinary care team collaboration (see longitudinal plan of care) . Discussed normal lag time between changes in blood glucose and interstitial glucose with patient and w/ RN CM. Understanding  verbalized. Reviewed that FreeStyle Libre 2 CGM does not require confirmatory finger sticks, and she is likely causing more stress by taking confirmatory finger sticks. Discussed stopping checking fingersticks (unless prompted to by the CGM due to a very low reading). Patient verbalized understanding. As she is not having, and not a medication regimen with the potential for, dangerously low readings overnight, discussed cutting off low glucose <70 alarm to avoid increasing stress. She verbalized understanding.  . Recommend continuing current regimen of Trulicity 3 mg weekly and Farxiga 10 mg daily. Will follow results of thyroid biopsy to determine appropriateness of continuing GLP1 therapy.  . Will collaborate w/ RN CM regarding avenues for procuring home arm BP machine. Fluctuations in wrist cuff likely contributing to patient's anxiety. She notes she is not able to afford to purchase a machine for herself at this time. Discussed that since she has Medicare Extra Help and no prescription premium, it may be in her best interest to investigate a Medicare Advantage plan for next year. Provided information about [place] [facility]. Will also message cardiology to see if they have any resources to help patients w/ HF.   Patient Self Care Activities:  . Patient will check blood glucose BID, document, and provide at future appointments . Patient will take medications as prescribed . Patient will report any questions or concerns to provider   Please see past updates related to this goal by clicking on the "Past Updates" button in the selected goal         The patient verbalized understanding of instructions provided today and agreed to receive a mailed copy of patient instruction and/or educational materials.    Plan:  - Scheduled f/u call in ~ 6 weeks  [name], PharmD, [place], [place] Pharmacist [place] [phone]

## 2019-10-07 NOTE — Chronic Care Management (AMB) (Signed)
Chronic Care Management   Follow Up Note   10/07/2019 Name: Vrinda Heckstall MRN: [id number] DOB: [birth year]  Referred by: McLean-Scocuzza, Nino Glow, MD Reason for referral : Chronic Care Management (Medication Management)   Maebry Obrien is a 59 y.o. year old female who is a primary care patient of McLean-Scocuzza, Nino Glow, MD. The CCM team was consulted for assistance with chronic disease management and care coordination needs.    Contacted patient after receiving a message from RN CM.  Review of patient status, including review of consultants reports, relevant laboratory and other test results, and collaboration with appropriate care team members and the patient's provider was performed as part of comprehensive patient evaluation and provision of chronic care management services.    SDOH (Social Determinants of Health) assessments performed: Yes See Care Plan activities for detailed interventions related to SDOH)  SDOH Interventions     Most Recent Value  SDOH Interventions  Financial Strain Interventions Other (Comment)  [[place] [facility] referral]       Outpatient Encounter Medications as of 10/07/2019  Medication Sig  . carvedilol (COREG) 25 MG tablet Take 1 tablet (25 mg total) by mouth 2 (two) times daily with a meal.  . Cholecalciferol (VITAMIN D-3) 125 MCG (5000 UT) TABS Take 1 tablet by mouth daily. (Patient taking differently: Take 5,000 Units by mouth daily. )  . dapagliflozin propanediol (FARXIGA) 10 MG TABS tablet Take 1 tablet (10 mg total) by mouth daily before breakfast.  . Dulaglutide (TRULICITY) 3 YY/5.0PT SOPN Inject 3 mg into the skin once a week.  . insulin lispro (HUMALOG KWIKPEN) 100 UNIT/ML KwikPen Check sugar 15-20 minutes before meals and give insulin based on sugar reading 70-130 0 units, 131-180 4 units, 181-240 8 units, 241-300 10 units, 301-350 12 units, 351-400 16 units, >400 20 units and call the doctor  . oxybutynin (DITROPAN XL) 10 MG 24 hr tablet Take  1 tablet (10 mg total) by mouth daily as needed for up to 14 days (bladder spasm/stent pain).  . ReliOn Ultra Thin Lancets MISC 1 Device by Does not apply route 4 (four) times daily -  before meals and at bedtime. relion brand  . rosuvastatin (CRESTOR) 10 MG tablet Take 10 mg by mouth every evening.   . sacubitril-valsartan (ENTRESTO) 97-103 MG Take 1 tablet by mouth 2 (two) times daily.  . sertraline (ZOLOFT) 100 MG tablet Take 1 tablet (100 mg total) by mouth daily.  . sodium chloride (OCEAN) 0.65 % SOLN nasal spray Place 1 spray into both nostrils as needed for congestion.   Marland Kitchen spironolactone (ALDACTONE) 25 MG tablet Take 0.5 tablets (12.5 mg total) by mouth daily.  Marland Kitchen sulfamethoxazole-trimethoprim (BACTRIM DS) 800-160 MG tablet Take 1 tablet by mouth daily.  . tamsulosin (FLOMAX) 0.4 MG CAPS capsule Take 1 capsule (0.4 mg total) by mouth daily after supper.  . blood glucose meter kit and supplies Dispense based on patient and insurance preference. Use up to four times daily as directed. (FOR ICD-10 E10.9, E11.9).  . Insulin Pen Needle (PEN NEEDLES) 31G X 8 MM MISC 1 Device by Does not apply route in the morning, at noon, and at bedtime. Use for humalog  . Multiple Vitamins-Minerals (CENTRUM SILVER 50+WOMEN PO) Take 1 tablet by mouth daily.  . mupirocin ointment (BACTROBAN) 2 % Apply 1 application topically 3 (three) times daily.   No facility-administered encounter medications on file as of 10/07/2019.     Objective:   Goals Addressed  This Visit's Progress     Patient Stated   .  "I want to be healthy (pt-stated)        CARE PLAN ENTRY (see longtitudinal plan of care for additional care plan information)  Current Barriers:  . Financial, social, and community barriers:  o Worried about discrepancies between fingersticks and CGM scans. Reported an episode to RN CM where CGM value was 129 and fingerstick value was 160. Notes that her "anxiety causes me to check more often  than I need to" o Upcoming thyroid biopsy w/ Dr. Honor Junes.  . Diabetes: conontrolled; complicated by CHF, CKD; most recent A1c 6.6% . Current antihyperglycemic regimen: Trulicity 3 mg weekly; Farxiga 10 mg daily, Humalog sliding scale, however, infrequent use. Notes last time she used it was for correcting a reading >180. Reports the low glucose alarm wakes her up overnight sometimes with readings 65-70s. No pattern of taking insulin before these episodes.  o Previously on metformin, stopped d/t kidney function decline . Cardiovascular risk reduction (recent dx HFrEF, EF 25-30%); follows w/ Dr. Garen Lah o Current hypertensive regimen: carvedilol 25 mg BID, spironolactone 12.5 mg daily, Entresto 97/103 mg BID - BP readings at goal ~120/70s at home, however, wrist machine often reads "error" o Current hyperlipidemia regimen: rosuvastatin 10 mg; LDL at goal <70 . Depression/anxiety: follows w/ Dr. Shella Spearing w/ sertraline 100 mg daily; has not been using trazodone PRN script  . Kidney stones: s/p stenting. Appt to remove stent later this week. Completing ABX script and tamsulosin  Pharmacist Clinical Goal(s):  Marland Kitchen Over the next 90 days, patient with work with PharmD and primary care provider to address optimized glycemic management  Interventions: . Comprehensive medication review performed, medication list updated in electronic medical record . Inter-disciplinary care team collaboration (see longitudinal plan of care) . Discussed normal lag time between changes in blood glucose and interstitial glucose with patient and w/ RN CM. Understanding verbalized. Reviewed that FreeStyle Libre 2 CGM does not require confirmatory finger sticks, and she is likely causing more stress by taking confirmatory finger sticks. Discussed stopping checking fingersticks (unless prompted to by the CGM due to a very low reading). Patient verbalized understanding. As she is not having, and not a medication regimen with  the potential for, dangerously low readings overnight, discussed cutting off low glucose <70 alarm to avoid increasing stress. She verbalized understanding.  . Recommend continuing current regimen of Trulicity 3 mg weekly and Farxiga 10 mg daily. Will follow results of thyroid biopsy to determine appropriateness of continuing GLP1 therapy.  . Will collaborate w/ RN CM regarding avenues for procuring home arm BP machine. Fluctuations in wrist cuff likely contributing to patient's anxiety. She notes she is not able to afford to purchase a machine for herself at this time. Discussed that since she has Medicare Extra Help and no prescription premium, it may be in her best interest to investigate a Medicare Advantage plan for next year. Provided information about [place] [facility]. Will also message cardiology to see if they have any resources to help patients w/ HF.   Patient Self Care Activities:  . Patient will check blood glucose BID, document, and provide at future appointments . Patient will take medications as prescribed . Patient will report any questions or concerns to provider   Please see past updates related to this goal by clicking on the "Past Updates" button in the selected goal          Plan:  - Scheduled f/u call in ~  6 weeks  [name], PharmD, [place], [place] Pharmacist [place] [phone]

## 2019-10-09 ENCOUNTER — Encounter: Payer: Self-pay | Admitting: Urology

## 2019-10-09 ENCOUNTER — Ambulatory Visit (INDEPENDENT_AMBULATORY_CARE_PROVIDER_SITE_OTHER): Payer: Medicare Other | Admitting: Urology

## 2019-10-09 ENCOUNTER — Other Ambulatory Visit: Payer: Self-pay

## 2019-10-09 VITALS — BP 132/69 | HR 88 | Ht 71.0 in | Wt 205.0 lb

## 2019-10-09 DIAGNOSIS — R319 Hematuria, unspecified: Secondary | ICD-10-CM

## 2019-10-09 DIAGNOSIS — N200 Calculus of kidney: Secondary | ICD-10-CM

## 2019-10-09 DIAGNOSIS — Z466 Encounter for fitting and adjustment of urinary device: Secondary | ICD-10-CM

## 2019-10-09 LAB — MICROSCOPIC EXAMINATION: RBC, Urine: >30 /hpf — AB (ref 0–2)

## 2019-10-09 LAB — URINALYSIS, COMPLETE
Bilirubin, UA: NEGATIVE
Ketones, UA: NEGATIVE
Nitrite, UA: NEGATIVE
Specific Gravity, UA: >1.03 — ABNORMAL HIGH (ref 1.005–1.030)
Urobilinogen, Ur: 0.2 mg/dL (ref 0.2–1.0)
pH, UA: 5 (ref 5.0–7.5)

## 2019-10-09 MED ORDER — CIPROFLOXACIN HCL 500 MG PO TABS
500.0000 mg | ORAL_TABLET | Freq: Once | ORAL | Status: AC
  Administered 2019-10-09: 500 mg via ORAL

## 2019-10-09 MED ORDER — SODIUM BICARBONATE 650 MG PO TABS: 650.0000 mg | ORAL_TABLET | Freq: Two times a day (BID) | ORAL | 2 refills | Status: DC

## 2019-10-09 NOTE — Progress Notes (Signed)
Cystoscopy Procedure Note:  Indication: Stent removal s/p left ureteroscopy for 2 cm left renal stone(uric acid)  Cipro given prior to stent removal.  After informed consent and discussion of the procedure and its risks, Meoshia Billing was positioned and prepped in the standard fashion. Cystoscopy was performed with a flexible cystoscope. The stent was grasped with flexible graspers and removed in its entirety. The patient tolerated the procedure well.  Findings: Uncomplicated stent removal  Assessment and Plan: Started sodium bicarbonate 650 mg twice daily for uric acid stones(cannot take potassium citrate) Follow up in 4 weeks with renal ultrasound to evaluate for silent hydronephrosis  We discussed general stone prevention strategies including adequate hydration with goal of producing 2.5 L of urine daily, increasing citric acid intake, increasing calcium intake during high oxalate meals, minimizing animal protein, and decreasing salt intake. Information about dietary recommendations given today.    Billey Co, MD 10/09/2019

## 2019-10-16 ENCOUNTER — Telehealth (INDEPENDENT_AMBULATORY_CARE_PROVIDER_SITE_OTHER): Payer: Medicare Other | Admitting: Psychiatry

## 2019-10-16 ENCOUNTER — Other Ambulatory Visit: Payer: Self-pay

## 2019-10-16 DIAGNOSIS — E042 Nontoxic multinodular goiter: Secondary | ICD-10-CM | POA: Diagnosis not present

## 2019-10-16 DIAGNOSIS — F411 Generalized anxiety disorder: Secondary | ICD-10-CM

## 2019-10-16 MED ORDER — SERTRALINE HCL 100 MG PO TABS
150.0000 mg | ORAL_TABLET | Freq: Every day | ORAL | 1 refills | Status: DC
Start: 2019-10-16 — End: 2020-01-16

## 2019-10-16 NOTE — Progress Notes (Signed)
[place] MD/PA/NP OP Progress Note  10/16/2019 9:41 AM Nancy Barajas  MRN:  [id number] Interview was conducted using videoconferencing application and I verified that I was speaking with the correct person using two identifiers. I discussed the limitations of evaluation and management by telemedicine and  the availability of in person appointments. Patient expressed understanding and agreed to proceed. Patient location - home; physician - home office.  Chief Complaint: "I still worry a lot".  HPI: 59 yo DAAF with long hx of anxiety and some depression. She is disabled and lives with younger daughter who works (3rd shift). She has been in counseling for the past few months and finds it helpful. She admits to worrying about variety of things: heath (primarily), daughter, finances. COVID pandemia did not help either. She denies feeling depressed or hopeless. Sleep and appetite are normal. She had been prescribed sertraline 50 mg over 30 years ago; we have resumed it 6 months ago, increased dose from 50 to 100 mg after one week. took it for a month or so but it did not help with anxiety. She has never been psychiatrically hospitalized. She denies having hx of mania, psychosis, alcohol or drug abuse.We have added sertraline 100 mg daily (was on 50 mg for first week). She tolerates it well and reports that anxiety has subsided some. She remains in individual counseling and finds it helpful.   Visit Diagnosis:    ICD-10-CM   1. GAD (generalized anxiety disorder)  F41.1     Past Psychiatric History: Please see intake H&P.  Past Medical History:  Past Medical History:  Diagnosis Date  . Anxiety   . Asthma   . Depression   . Diabetes mellitus without complication ([place])   . History of kidney stones   . Hypertension   . Kidney stones   . Vertigo    when laying flat  . Vocal cord dysfunction    makes it hard for patient to talk and breathe    Past Surgical History:  Procedure Laterality Date   . CARDIAC CATHETERIZATION    . CYSTOSCOPY W/ RETROGRADES Right 09/27/2019   Procedure: CYSTOSCOPY WITH RETROGRADE PYELOGRAM;  Surgeon: Billey Co, MD;  Location: [facility] OR[facility];  Service: Urology;  Laterality: Right;  . CYSTOSCOPY/URETEROSCOPY/HOLMIUM LASER/STENT PLACEMENT Left 09/27/2019   Procedure: CYSTOSCOPY/URETEROSCOPY/HOLMIUM LASER/STENT PLACEMENT;  Surgeon: Billey Co, MD;  Location: [facility] OR[facility];  Service: Urology;  Laterality: Left;  . LITHOTRIPSY    . RIGHT/LEFT HEART CATH AND CORONARY ANGIOGRAPHY Bilateral 04/08/2019   Procedure: RIGHT/LEFT HEART CATH AND CORONARY ANGIOGRAPHY;  Surgeon: Wellington Hampshire, MD;  Location: [facility];  Service: Cardiovascular;  Laterality: Bilateral;    Family Psychiatric History: None.  Family History:  Family History  Problem Relation Age of Onset  . Hypertension Mother   . Diabetes Mother   . Hypertension Father   . Diabetes Father     Social History:  Social History   Socioeconomic History  . Marital status: Divorced    Spouse name: Not on file  . Number of children: 2  . Years of education: Not on file  . Highest education level: 9th grade  Occupational History  . Not on file  Tobacco Use  . Smoking status: Former Smoker    Packs/day: 1.00    Years: 10.00    Pack years: 10.00    Quit date: 01/03/1998    Years since quitting: 21.7  . Smokeless tobacco: Never Used  Vaping Use  . Vaping Use: Never  used  Substance and Sexual Activity  . Alcohol use: Not Currently  . Drug use: Not on file    Comment: years ago  . Sexual activity: Not Currently  Other Topics Concern  . Not on file  Social History Narrative   Lives at home    2 daughters as of 04/04/19 44 y.o daughter lives with her (she works)   Patient is on disability.   Social Determinants of Health   Financial Resource Strain: High Risk  . Difficulty of Paying Living Expenses: Hard  Food Insecurity: No Food Insecurity  . Worried About Charity fundraiser in  the Last Year: Never true  . Ran Out of Food in the Last Year: Never true  Transportation Needs: No Transportation Needs  . Lack of Transportation (Medical): No  . Lack of Transportation (Non-Medical): No  Physical Activity: Unknown  . Days of Exercise per Week: 0 days  . Minutes of Exercise per Session: Not on file  Stress: No Stress Concern Present  . Feeling of Stress : Only a little  Social Connections: Unknown  . Frequency of Communication with Friends and Family: Three times a week  . Frequency of Social Gatherings with Friends and Family: More than three times a week  . Attends Religious Services: Never  . Active Member of Clubs or Organizations: Yes  . Attends Archivist Meetings: Never  . Marital Status: Not on file    Allergies:  Allergies  Allergen Reactions  . Penicillins Rash    Did it involve swelling of the face/tongue/throat, SOB, or low BP? No Did it involve sudden or severe rash/hives, skin peeling, or any reaction on the inside of your mouth or nose? No Did you need to seek medical attention at a hospital or doctor's office? No When did it last happen?Childhood If all above answers are "NO", may proceed with cephalosporin use.    Metabolic Disorder Labs: Lab Results  Component Value Date   HGBA1C 6.6 (H) 07/10/2019   No results found for: PROLACTIN Lab Results  Component Value Date   CHOL 160 02/20/2019   TRIG 63.0 02/20/2019   HDL 79.70 02/20/2019   CHOLHDL 2 02/20/2019   VLDL 12.6 02/20/2019   LDLCALC 67 02/20/2019   LDLCALC 87 11/07/2018   Lab Results  Component Value Date   TSH 0.93 11/07/2018   TSH 1.36 08/07/2018    Therapeutic Level Labs: No results found for: LITHIUM No results found for: VALPROATE No components found for:  CBMZ  Current Medications: Current Outpatient Medications  Medication Sig Dispense Refill  . blood glucose meter kit and supplies Dispense based on patient and insurance preference. Use up to  four times daily as directed. (FOR ICD-10 E10.9, E11.9). 1 each 0  . carvedilol (COREG) 25 MG tablet Take 1 tablet (25 mg total) by mouth 2 (two) times daily with a meal. 180 tablet 3  . Cholecalciferol (VITAMIN D-3) 125 MCG (5000 UT) TABS Take 1 tablet by mouth daily. (Patient taking differently: Take 5,000 Units by mouth daily. ) 90 tablet 3  . dapagliflozin propanediol (FARXIGA) 10 MG TABS tablet Take 1 tablet (10 mg total) by mouth daily before breakfast. 30 tablet 0  . Dulaglutide (TRULICITY) 3 ZH/2.9JM SOPN Inject 3 mg into the skin once a week. 5 pen 5  . insulin lispro (HUMALOG KWIKPEN) 100 UNIT/ML KwikPen Check sugar 15-20 minutes before meals and give insulin based on sugar reading 70-130 0 units, 131-180 4 units, 181-240 8 units,  241-300 10 units, 301-350 12 units, 351-400 16 units, >400 20 units and call the doctor 5 pen 11  . Insulin Pen Needle (PEN NEEDLES) 31G X 8 MM MISC 1 Device by Does not apply route in the morning, at noon, and at bedtime. Use for humalog 300 each 12  . Multiple Vitamins-Minerals (CENTRUM SILVER 50+WOMEN PO) Take 1 tablet by mouth daily.    . mupirocin ointment (BACTROBAN) 2 % Apply 1 application topically 3 (three) times daily. 30 g 0  . ReliOn Ultra Thin Lancets MISC 1 Device by Does not apply route 4 (four) times daily -  before meals and at bedtime. relion brand 400 each 12  . rosuvastatin (CRESTOR) 10 MG tablet Take 10 mg by mouth every evening.     . sacubitril-valsartan (ENTRESTO) 97-103 MG Take 1 tablet by mouth 2 (two) times daily. 60 tablet 5  . sertraline (ZOLOFT) 100 MG tablet Take 1.5 tablets (150 mg total) by mouth daily. 135 tablet 1  . sodium bicarbonate 650 MG tablet Take 1 tablet (650 mg total) by mouth 2 (two) times daily. 60 tablet 2  . sodium chloride (OCEAN) 0.65 % SOLN nasal spray Place 1 spray into both nostrils as needed for congestion.     Marland Kitchen spironolactone (ALDACTONE) 25 MG tablet Take 0.5 tablets (12.5 mg total) by mouth daily. 45 tablet  1  . sulfamethoxazole-trimethoprim (BACTRIM DS) 800-160 MG tablet Take 1 tablet by mouth daily. 20 tablet 0  . tamsulosin (FLOMAX) 0.4 MG CAPS capsule Take 1 capsule (0.4 mg total) by mouth daily after supper. 20 capsule 0   No current facility-administered medications for this visit.     Psychiatric Specialty Exam: Review of Systems  Psychiatric/Behavioral: The patient is nervous/anxious.   All other systems reviewed and are negative.   Last menstrual period 01/03/2013.There is no height or weight on file to calculate BMI.  General Appearance: Casual and Fairly Groomed  Eye Contact:  Good  Speech:  Clear and Coherent and Normal Rate  Volume:  Normal  Mood:  Anxious  Affect:  Full Range  Thought Process:  Goal Directed  Orientation:  Full (Time, Place, and Person)  Thought Content: Rumination   Suicidal Thoughts:  No  Homicidal Thoughts:  No  Memory:  Immediate;   Good Recent;   Good Remote;   Good  Judgement:  Good  Insight:  Fair  Psychomotor Activity:  Normal  Concentration:  Concentration: Good  Recall:  Good  Fund of Knowledge: Good  Language: Good  Akathisia:  Negative  Handed:  Right  AIMS (if indicated): not done  Assets:  Communication Skills Desire for Improvement Financial Resources/Insurance Housing Social Support  ADL's:  Intact  Cognition: WNL  Sleep:  Good   Screenings: GAD-7     Office Visit from 04/04/2019 in [place]  Total GAD-7 Score 11    PHQ2-9     Patient Outreach Telephone from 05/01/2019 in [facility] Visit from 04/04/2019 in [facility] Nutrition from 03/27/2019 in [facility] from 03/01/2019 in [facility] Office Visit from 11/07/2018 in [place]  PHQ-2 Total Score '2 4 4 ' 0 3  PHQ-9 Total Score '5 13 13 ' -- 10       Assessment and Plan: 59 yo DAAF with long hx of anxiety and some depression. She is  disabled and lives with younger daughter who works (3rd shift). She has been in counseling for  the past few months and finds it helpful. She admits to worrying about variety of things: heath (primarily), daughter, finances. COVID pandemia did not help either. She denies feeling depressed or hopeless. Sleep and appetite are normal. She had been prescribed sertraline 50 mg over 30 years ago; we have resumed it 6 months ago, increased dose from 50 to 100 mg after one week. took it for a month or so but it did not help with anxiety. She has never been psychiatrically hospitalized. She denies having hx of mania, psychosis, alcohol or drug abuse.We have added sertraline 100 mg daily (was on 50 mg for first week). She tolerates it well and reports that anxiety has subsided some. She remains in individual counseling and finds it helpful.  Dx: Generalized anxiety disorder  Plan: We willincrease dose of sertralineto 150 mg daily. Next appointment in3 months.The plan was discussed with patient who had an opportunity to ask questions and these were all answered. I spend66mnutes in videoconferencingwith the patient.    OStephanie Acre MD 10/16/2019, 9:41 AM

## 2019-10-21 ENCOUNTER — Ambulatory Visit (INDEPENDENT_AMBULATORY_CARE_PROVIDER_SITE_OTHER): Payer: Medicare Other | Admitting: Psychology

## 2019-10-21 DIAGNOSIS — F418 Other specified anxiety disorders: Secondary | ICD-10-CM

## 2019-10-28 ENCOUNTER — Other Ambulatory Visit: Payer: Self-pay | Admitting: *Deleted

## 2019-10-29 MED ORDER — DAPAGLIFLOZIN PROPANEDIOL 10 MG PO TABS
10.0000 mg | ORAL_TABLET | Freq: Every day | ORAL | 2 refills | Status: DC
Start: 2019-10-29 — End: 2019-11-12

## 2019-10-31 ENCOUNTER — Other Ambulatory Visit: Payer: Self-pay | Admitting: *Deleted

## 2019-10-31 DIAGNOSIS — I428 Other cardiomyopathies: Secondary | ICD-10-CM

## 2019-10-31 MED ORDER — ENTRESTO 97-103 MG PO TABS: 1.0000 | ORAL_TABLET | Freq: Two times a day (BID) | ORAL | 1 refills | Status: DC

## 2019-11-07 ENCOUNTER — Other Ambulatory Visit: Payer: Self-pay | Admitting: *Deleted

## 2019-11-07 ENCOUNTER — Encounter: Payer: Self-pay | Admitting: *Deleted

## 2019-11-07 NOTE — Patient Outreach (Signed)
[facility]) Care Management [facility] CM Telephone Outreach  11/07/2019  Nancy Barajas 1960-05-31 [id number]  [place] [name], 59 y/o female referred to [facility] RN CM on April 25, 2019 by [facility] CMA for routine PCP referral for DM management and new diagnosis of CHF. Patient has had no recent unplanned hospitalizations. Patient has history including, but not limited to, HTN/ HLD; DM- type II with A1-C of 8.6 (8.6- AQT62,2633); CKD- III; and anxiety/ depression.  A1-C on 07/10/19: 6.6(Down from 8.6in February 2021)  HIPAA/ identity verified; patient reports shehas "beendoinggood;" and she sounds to be in no distress throughout call today.Reports no clinical concerns and confirms that she has attended all recent provider appointments, continues to drive self.  Confirms she received automated blood pressure cuff- reviewed recent blood pressures at home with patient, who reports they "are running high;" 139-156/93-98: encouraged patient to take home blood pressure readings to upcoming scheduled PCP office visit- states she will do; confirmed she is taking all of prescribed medications as instructed.  Encouraged her to continue monitoring/ recording consistently at home.  Reviewed recent blood sugars with patient- reports general ranges between 86-149, both fasting and post-prandial; consistently using CGM; encouraged her to continue ongoing engagement with [facility] Pharmacist embedded at PCP practice around her co-pays for CGM system.  Patient denies signs/ symptoms hypoglycemia; these were reviewed with patient along with corresponding action plan for same.   Continues monitoring recording daily weights- reports no changes; consistently between 200-203 lbs; patient remains very happy about the recent improvement in her ECHOcardiogram.  Patient denies further issues, concerns, or problems today. Iconfirmed that patient hasmy direct phone number,  the main [name] office phone number, and the [facility] CM 24-hour nurse advice phone number should issues arise prior to next scheduled [name] outreachnext month, possibly for transfer to [place], as we have been discussing over the last few weeks- she is agreeable to this plan.  Encouraged patient to contact me directly if needs, questions, issues, or concerns arise prior to next scheduled outreach; patient agreed to do so.  Oneta Rack, RN, BSN, [employer] [facility] Care Management  [phone]

## 2019-11-07 NOTE — Patient Instructions (Signed)
Goals Addressed            This Visit's Progress   . COMPLETED: [name]: Learn More About My Health   On track    Follow Up Date 11/08/2019    - tell my story and reason for my visit - make a list of questions - ask questions - repeat what I heard to make sure I understand - bring a list of my medicines to the visit - speak up when I don't understand    Why is this important?   The best way to learn about your health and care is by talking to the doctor and nurse.  They will answer your questions and give you information in the way that you like best.    Notes:   11/07/19: goal successfully met; completed    . [name]: Make and Keep All Appointments   On track    Follow Up Date 12/05/2019    - keep a calendar with appointment dates    Why is this important?   Part of staying healthy is seeing the doctor for follow-up care.  If you forget your appointments, there are some things you can do to stay on track.    Notes:   Patient reports continues to drive self to appointments Appointments reviewed with patient who verbalizes plans to attend all as scheduled  11/07/19: Reviewed all recent/ upcoming provider appointments with patient who verbalizes accurate understanding of all with plans to attend all as scheduled; discussed need for flu vaccination for 2021 and encouraged patient to obtain at upcoming scheduled PCP office visit 11/12/19    . [name]: Monitor and Manage My Blood Sugar   On track    Follow Up Date 12/05/2019    - check blood sugar at prescribed times - check blood sugar if I feel it is too high or too low - enter blood sugar readings and medication or insulin into daily log - take the blood sugar log to all doctor visits    Why is this important?   Checking your blood sugar at home helps to keep it from getting very high or very low.  Writing the results in a diary or log helps the doctor know how to care for you.  Your blood sugar log should have the time,  date and the results.  Also, write down the amount of insulin or other medicine that you take.  Other information, like what you ate, exercise done and how you were feeling, will also be helpful.     Notes:   10/07/19: reviewed with patient all recent blood sugar values from both CGM and glucometer  11/08/19: reviewed recent blood sugars with patient; confirmed patient using CGM consistently; reviewed with patient signs/ symptoms low blood sugar along with corresponding action plan; printed educational material mailed to patient around same         Hypoglycemia Hypoglycemia is when the sugar (glucose) level in your blood is too low. Signs of low blood sugar may include:  Feeling: ? Hungry. ? Worried or nervous (anxious). ? Sweaty and clammy. ? Confused. ? Dizzy. ? Sleepy. ? Sick to your stomach (nauseous).  Having: ? A fast heartbeat. ? A headache. ? A change in your vision. ? Tingling or no feeling (numbness) around your mouth, lips, or tongue. ? Jerky movements that you cannot control (seizure).  Having trouble with: ? Moving (coordination). ? Sleeping. ? Passing out (fainting). ? Getting upset easily (irritability). Low  blood sugar can happen to people who have diabetes and people who do not have diabetes. Low blood sugar can happen quickly, and it can be an emergency. Treating low blood sugar Low blood sugar is often treated by eating or drinking something sugary right away, such as:  Fruit juice, 4-6 oz (120-150 mL).  Regular soda (not diet soda), 4-6 oz (120-150 mL).  Low-fat milk, 4 oz (120 mL).  Several pieces of hard candy.  Sugar or honey, 1 Tbsp (15 mL). Treating low blood sugar if you have diabetes If you can think clearly and swallow safely, follow the 15:15 rule:  Take 15 grams of a fast-acting carb (carbohydrate). Talk with your doctor about how much you should take.  Always keep a source of fast-acting carb with you, such as: ? Sugar tablets  (glucose pills). Take 3-4 pills. ? 6-8 pieces of hard candy. ? 4-6 oz (120-150 mL) of fruit juice. ? 4-6 oz (120-150 mL) of regular (not diet) soda. ? 1 Tbsp (15 mL) honey or sugar.  Check your blood sugar 15 minutes after you take the carb.  If your blood sugar is still at or below 70 mg/dL (3.9 mmol/L), take 15 grams of a carb again.  If your blood sugar does not go above 70 mg/dL (3.9 mmol/L) after 3 tries, get help right away.  After your blood sugar goes back to normal, eat a meal or a snack within 1 hour.  Treating very low blood sugar If your blood sugar is at or below 54 mg/dL (3 mmol/L), you have very low blood sugar (severe hypoglycemia). This may also cause:  Passing out.  Jerky movements you cannot control (seizure).  Losing consciousness (coma). This is an emergency. Do not wait to see if the symptoms will go away. Get medical help right away. Call your local emergency services (911 in the [place] Do not drive yourself to the hospital. If you have very low blood sugar and you cannot eat or drink, you may need a glucagon shot (injection). A family member or friend should learn how to check your blood sugar and how to give you a glucagon shot. Ask your doctor if you need to have a glucagon shot kit at home. Follow these instructions at home: General instructions  Take over-the-counter and prescription medicines only as told by your doctor.  Stay aware of your blood sugar as told by your doctor.  Limit alcohol intake to no more than 1 drink a day for nonpregnant women and 2 drinks a day for men. One drink equals 12 oz of beer (355 mL), 5 oz of wine (148 mL), or 1 oz of hard liquor (44 mL).  Keep all follow-up visits as told by your doctor. This is important. If you have diabetes:   Follow your diabetes care plan as told by your doctor. Make sure you: ? Know the signs of low blood sugar. ? Take your medicines as told. ? Follow your exercise and meal plan. ? Eat on  time. Do not skip meals. ? Check your blood sugar as often as told by your doctor. Always check it before and after exercise. ? Follow your sick day plan when you cannot eat or drink normally. Make this plan ahead of time with your doctor.  Share your diabetes care plan with: ? Your work or school. ? People you live with.  Check your pee (urine) for ketones: ? When you are sick. ? As told by your doctor.  Carry  a card or wear jewelry that says you have diabetes. Contact a doctor if:  You have trouble keeping your blood sugar in your target range.  You have low blood sugar often. Get help right away if:  You still have symptoms after you eat or drink something sugary.  Your blood sugar is at or below 54 mg/dL (3 mmol/L).  You have jerky movements that you cannot control.  You pass out. These symptoms may be an emergency. Do not wait to see if the symptoms will go away. Get medical help right away. Call your local emergency services (911 in the [place] Do not drive yourself to the hospital. Summary  Hypoglycemia happens when the level of sugar (glucose) in your blood is too low.  Low blood sugar can happen to people who have diabetes and people who do not have diabetes. Low blood sugar can happen quickly, and it can be an emergency.  Make sure you know the signs of low blood sugar and know how to treat it.  Always keep a source of sugar (fast-acting carb) with you to treat low blood sugar. This information is not intended to replace advice given to you by your health care provider. Make sure you discuss any questions you have with your health care provider. Document Revised: 04/12/2018 Document Reviewed: 01/23/2015 Elsevier Patient Education  2020 [employer].

## 2019-11-11 ENCOUNTER — Ambulatory Visit (INDEPENDENT_AMBULATORY_CARE_PROVIDER_SITE_OTHER): Payer: Medicare Other | Admitting: Psychology

## 2019-11-11 DIAGNOSIS — F418 Other specified anxiety disorders: Secondary | ICD-10-CM | POA: Diagnosis not present

## 2019-11-12 ENCOUNTER — Other Ambulatory Visit: Payer: Self-pay

## 2019-11-12 ENCOUNTER — Encounter: Payer: Self-pay | Admitting: Internal Medicine

## 2019-11-12 ENCOUNTER — Ambulatory Visit (INDEPENDENT_AMBULATORY_CARE_PROVIDER_SITE_OTHER): Payer: Medicare Other | Admitting: Internal Medicine

## 2019-11-12 VITALS — BP 134/78 | HR 82 | Temp 98.1°F | Ht 71.0 in | Wt 203.0 lb

## 2019-11-12 DIAGNOSIS — Z0100 Encounter for examination of eyes and vision without abnormal findings: Secondary | ICD-10-CM

## 2019-11-12 DIAGNOSIS — E1159 Type 2 diabetes mellitus with other circulatory complications: Secondary | ICD-10-CM | POA: Diagnosis not present

## 2019-11-12 DIAGNOSIS — E119 Type 2 diabetes mellitus without complications: Secondary | ICD-10-CM

## 2019-11-12 DIAGNOSIS — Z1329 Encounter for screening for other suspected endocrine disorder: Secondary | ICD-10-CM

## 2019-11-12 DIAGNOSIS — Z23 Encounter for immunization: Secondary | ICD-10-CM

## 2019-11-12 DIAGNOSIS — I152 Hypertension secondary to endocrine disorders: Secondary | ICD-10-CM | POA: Diagnosis not present

## 2019-11-12 DIAGNOSIS — R490 Dysphonia: Secondary | ICD-10-CM

## 2019-11-12 DIAGNOSIS — R195 Other fecal abnormalities: Secondary | ICD-10-CM | POA: Diagnosis not present

## 2019-11-12 LAB — COMPREHENSIVE METABOLIC PANEL
ALT: 14 U/L (ref 0–35)
AST: 15 U/L (ref 0–37)
Albumin: 4.2 g/dL (ref 3.5–5.2)
Alkaline Phosphatase: 59 U/L (ref 39–117)
BUN: 26 mg/dL — ABNORMAL HIGH (ref 6–23)
CO2: 28 mEq/L (ref 19–32)
Calcium: 9.3 mg/dL (ref 8.4–10.5)
Chloride: 103 mEq/L (ref 96–112)
Creatinine, Ser: 1.19 mg/dL (ref 0.40–1.20)
GFR: 49.87 mL/min — ABNORMAL LOW (ref >60.00)
Glucose, Bld: 96 mg/dL (ref 70–99)
Potassium: 3.9 mEq/L (ref 3.5–5.1)
Sodium: 139 mEq/L (ref 135–145)
Total Bilirubin: 0.6 mg/dL (ref 0.2–1.2)
Total Protein: 6.9 g/dL (ref 6.0–8.3)

## 2019-11-12 LAB — CBC WITH DIFFERENTIAL/PLATELET
Basophils Absolute: 0 10*3/uL (ref 0.0–0.1)
Basophils Relative: 1 % (ref 0.0–3.0)
Eosinophils Absolute: 0.1 10*3/uL (ref 0.0–0.7)
Eosinophils Relative: 1.3 % (ref 0.0–5.0)
HCT: 40.3 % (ref 36.0–46.0)
Hemoglobin: 12.8 g/dL (ref 12.0–15.0)
Lymphocytes Relative: 28.3 % (ref 12.0–46.0)
Lymphs Abs: 1.4 10*3/uL (ref 0.7–4.0)
MCHC: 31.6 g/dL (ref 30.0–36.0)
MCV: 72.7 fl — ABNORMAL LOW (ref 78.0–100.0)
Monocytes Absolute: 0.4 10*3/uL (ref 0.1–1.0)
Monocytes Relative: 7.4 % (ref 3.0–12.0)
Neutro Abs: 3 10*3/uL (ref 1.4–7.7)
Neutrophils Relative %: 62 % (ref 43.0–77.0)
Platelets: 284 10*3/uL (ref 150.0–400.0)
RBC: 5.55 Mil/uL — ABNORMAL HIGH (ref 3.87–5.11)
RDW: 17.3 % — ABNORMAL HIGH (ref 11.5–15.5)
WBC: 4.9 10*3/uL (ref 4.0–10.5)

## 2019-11-12 LAB — LIPID PANEL
Cholesterol: 186 mg/dL (ref 0–200)
HDL: 87.7 mg/dL (ref >39.00)
LDL Cholesterol: 85 mg/dL (ref 0–99)
NonHDL: 98.45
Total CHOL/HDL Ratio: 2
Triglycerides: 67 mg/dL (ref 0.0–149.0)
VLDL: 13.4 mg/dL (ref 0.0–40.0)

## 2019-11-12 LAB — TSH: TSH: 0.85 u[IU]/mL (ref 0.35–4.50)

## 2019-11-12 LAB — HEMOGLOBIN A1C: Hgb A1c MFr Bld: 6.4 % (ref 4.6–6.5)

## 2019-11-12 MED ORDER — DAPAGLIFLOZIN PROPANEDIOL 5 MG PO TABS: 5.0000 mg | ORAL_TABLET | Freq: Every day | ORAL | Status: DC

## 2019-11-12 NOTE — Patient Instructions (Addendum)
covid booster pfizer 01/13/20  Consider Tdap vaccine and shingrix vaccines in the future  Cut farxiga 10 mg in 1/2 with pill cutter  Consider glucose tablets  Referred stomach doctors, eye doctor and [facility] about your voice

## 2019-11-12 NOTE — Progress Notes (Signed)
Chief Complaint  Patient presents with  . Follow-up  . Diabetes  . Immunizations   F/u  1. BP improved on coreg 25 mg bid, entresto 97-103 ,spironolactone 12.5 mg QAM TAKES MEDS AT 9 AM NOT HAD TODAY BP at home 107-140s/150s (in am before meds)  2. DM controlled download with projected A1C 5.8 Avg cbg 104 and cbgs dropping to 40-08 on trulicity 3 weekly, farxiga 10 mg qd  She is not taking humalog  3. Vocal laryngeal dysphonia voice worse x 5-6 years pulm rec Botox consider referral agreeable today  4. MNG goiter thyroid bx 10/16/19 negative [facility] get records   Review of Systems  Constitutional: Negative for weight loss.  HENT:       +voice changes x 5-6 years  Eyes: Negative for blurred vision.  Respiratory: Negative for shortness of breath.   Cardiovascular: Negative for chest pain.  Gastrointestinal: Negative for abdominal pain.  Musculoskeletal: Negative for falls and joint pain.  Skin: Negative for rash.  Psychiatric/Behavioral: Negative for depression. The patient is nervous/anxious.    Past Medical History:  Diagnosis Date  . Anxiety   . Asthma   . Depression   . Diabetes mellitus without complication ([place])   . History of kidney stones   . Hypertension   . Kidney stones   . Vertigo    when laying flat  . Vocal cord dysfunction    makes it hard for patient to talk and breathe   Past Surgical History:  Procedure Laterality Date  . CARDIAC CATHETERIZATION    . CYSTOSCOPY W/ RETROGRADES Right 09/27/2019   Procedure: CYSTOSCOPY WITH RETROGRADE PYELOGRAM;  Surgeon: Billey Co, MD;  Location: [facility];  Service: Urology;  Laterality: Right;  . CYSTOSCOPY/URETEROSCOPY/HOLMIUM LASER/STENT PLACEMENT Left 09/27/2019   Procedure: CYSTOSCOPY/URETEROSCOPY/HOLMIUM LASER/STENT PLACEMENT;  Surgeon: Billey Co, MD;  Location: [facility];  Service: Urology;  Laterality: Left;  . LITHOTRIPSY    . RIGHT/LEFT HEART CATH AND CORONARY ANGIOGRAPHY Bilateral 04/08/2019    Procedure: RIGHT/LEFT HEART CATH AND CORONARY ANGIOGRAPHY;  Surgeon: Wellington Hampshire, MD;  Location: [facility];  Service: Cardiovascular;  Laterality: Bilateral;   Family History  Problem Relation Age of Onset  . Hypertension Mother   . Diabetes Mother   . Hypertension Father   . Diabetes Father    Social History   Socioeconomic History  . Marital status: Divorced    Spouse name: Not on file  . Number of children: 2  . Years of education: Not on file  . Highest education level: 9th grade  Occupational History  . Not on file  Tobacco Use  . Smoking status: Former Smoker    Packs/day: 1.00    Years: 10.00    Pack years: 10.00    Quit date: 01/03/1998    Years since quitting: 21.8  . Smokeless tobacco: Never Used  Vaping Use  . Vaping Use: Never used  Substance and Sexual Activity  . Alcohol use: Not Currently  . Drug use: Not on file    Comment: years ago  . Sexual activity: Not Currently  Other Topics Concern  . Not on file  Social History Narrative   Lives at home    2 daughters as of 04/04/19 82 y.o daughter lives with her (she works)   Patient is on disability.   Social Determinants of Health   Financial Resource Strain: High Risk  . Difficulty of Paying Living Expenses: Hard  Food Insecurity: No Food Insecurity  .  Worried About Charity fundraiser in the Last Year: Never true  . Ran Out of Food in the Last Year: Never true  Transportation Needs: No Transportation Needs  . Lack of Transportation (Medical): No  . Lack of Transportation (Non-Medical): No  Physical Activity: Unknown  . Days of Exercise per Week: 0 days  . Minutes of Exercise per Session: Not on file  Stress: No Stress Concern Present  . Feeling of Stress : Only a little  Social Connections: Unknown  . Frequency of Communication with Friends and Family: Three times a week  . Frequency of Social Gatherings with Friends and Family: More than three times a week  . Attends Religious  Services: Never  . Active Member of Clubs or Organizations: Yes  . Attends Archivist Meetings: Never  . Marital Status: Not on file  Intimate Partner Violence: Not At Risk  . Fear of Current or Ex-Partner: No  . Emotionally Abused: No  . Physically Abused: No  . Sexually Abused: No   Current Meds  Medication Sig  . blood glucose meter kit and supplies Dispense based on patient and insurance preference. Use up to four times daily as directed. (FOR ICD-10 E10.9, E11.9).  . carvedilol (COREG) 25 MG tablet Take 1 tablet (25 mg total) by mouth 2 (two) times daily with a meal.  . Cholecalciferol (VITAMIN D-3) 125 MCG (5000 UT) TABS Take 1 tablet by mouth daily. (Patient taking differently: Take 5,000 Units by mouth daily. )  . dapagliflozin propanediol (FARXIGA) 5 MG TABS tablet Take 1 tablet (5 mg total) by mouth daily before breakfast.  . Dulaglutide (TRULICITY) 3 SE/3.9RV SOPN Inject 3 mg into the skin once a week.  . insulin lispro (HUMALOG KWIKPEN) 100 UNIT/ML KwikPen Check sugar 15-20 minutes before meals and give insulin based on sugar reading 70-130 0 units, 131-180 4 units, 181-240 8 units, 241-300 10 units, 301-350 12 units, 351-400 16 units, >400 20 units and call the doctor  . Insulin Pen Needle (PEN NEEDLES) 31G X 8 MM MISC 1 Device by Does not apply route in the morning, at noon, and at bedtime. Use for humalog  . Multiple Vitamins-Minerals (CENTRUM SILVER 50+WOMEN PO) Take 1 tablet by mouth daily.  . mupirocin ointment (BACTROBAN) 2 % Apply 1 application topically 3 (three) times daily.  . ReliOn Ultra Thin Lancets MISC 1 Device by Does not apply route 4 (four) times daily -  before meals and at bedtime. relion brand  . rosuvastatin (CRESTOR) 10 MG tablet Take 10 mg by mouth every evening.   . sacubitril-valsartan (ENTRESTO) 97-103 MG Take 1 tablet by mouth 2 (two) times daily.  . sertraline (ZOLOFT) 100 MG tablet Take 1.5 tablets (150 mg total) by mouth daily.  . sodium  bicarbonate 650 MG tablet Take 1 tablet (650 mg total) by mouth 2 (two) times daily.  . sodium chloride (OCEAN) 0.65 % SOLN nasal spray Place 1 spray into both nostrils as needed for congestion.   Marland Kitchen spironolactone (ALDACTONE) 25 MG tablet Take 0.5 tablets (12.5 mg total) by mouth daily.  . [DISCONTINUED] dapagliflozin propanediol (FARXIGA) 10 MG TABS tablet Take 1 tablet (10 mg total) by mouth daily before breakfast.  . [DISCONTINUED] sulfamethoxazole-trimethoprim (BACTRIM DS) 800-160 MG tablet Take 1 tablet by mouth daily.  . [DISCONTINUED] tamsulosin (FLOMAX) 0.4 MG CAPS capsule Take 1 capsule (0.4 mg total) by mouth daily after supper.   Allergies  Allergen Reactions  . Penicillins Rash    Did  it involve swelling of the face/tongue/throat, SOB, or low BP? No Did it involve sudden or severe rash/hives, skin peeling, or any reaction on the inside of your mouth or nose? No Did you need to seek medical attention at a hospital or doctor's office? No When did it last happen?Childhood If all above answers are "NO", may proceed with cephalosporin use.   Recent Results (from the past 2160 hour(s))  SARS CORONAVIRUS 2 (TAT 6-24 HRS) Nasopharyngeal Nasopharyngeal Swab     Status: None   Collection Time: 08/28/19 10:59 AM   Specimen: Nasopharyngeal Swab  Result Value Ref Range   SARS Coronavirus 2 NEGATIVE NEGATIVE    Comment: (NOTE) SARS-CoV-2 target nucleic acids are NOT DETECTED.  The SARS-CoV-2 RNA is generally detectable in upper and lower respiratory specimens during the acute phase of infection. Negative results do not preclude SARS-CoV-2 infection, do not rule out co-infections with other pathogens, and should not be used as the sole basis for treatment or other patient management decisions. Negative results must be combined with clinical observations, patient history, and epidemiological information. The expected result is Negative.  Fact Sheet for  Patients: SugarRoll.be  Fact Sheet for Healthcare Providers: https://www.woods-mathews.com/  This test is not yet approved or cleared by the [place] FDA and  has been authorized for detection and/or diagnosis of SARS-CoV-2 by FDA under an Emergency Use Authorization (EUA). This EUA will remain  in effect (meaning this test can be used) for the duration of the COVID-19 declaration under Se ction 564(b)(1) of the Act, 21 U.S.C. section 360bbb-3(b)(1), unless the authorization is terminated or revoked sooner.  Performed at [facility], [place] [address], [place], [place] [address]   ECHOCARDIOGRAM COMPLETE     Status: None   Collection Time: 09/10/19 10:00 AM  Result Value Ref Range   S' Lateral 3.10 cm   Area-P 1/2 3.63 cm2   Single Plane A4C EF 50.7 %   Single Plane A2C EF 29.8 %   Calc EF 42.6 %  SARS CORONAVIRUS 2 (TAT 6-24 HRS) Nasopharyngeal Nasopharyngeal Swab     Status: None   Collection Time: 09/25/19  9:45 AM   Specimen: Nasopharyngeal Swab  Result Value Ref Range   SARS Coronavirus 2 NEGATIVE NEGATIVE    Comment: (NOTE) SARS-CoV-2 target nucleic acids are NOT DETECTED.  The SARS-CoV-2 RNA is generally detectable in upper and lower respiratory specimens during the acute phase of infection. Negative results do not preclude SARS-CoV-2 infection, do not rule out co-infections with other pathogens, and should not be used as the sole basis for treatment or other patient management decisions. Negative results must be combined with clinical observations, patient history, and epidemiological information. The expected result is Negative.  Fact Sheet for Patients: SugarRoll.be  Fact Sheet for Healthcare Providers: https://www.woods-mathews.com/  This test is not yet approved or cleared by the [place] FDA and  has been authorized for detection and/or diagnosis of SARS-CoV-2  by FDA under an Emergency Use Authorization (EUA). This EUA will remain  in effect (meaning this test can be used) for the duration of the COVID-19 declaration under Se ction 564(b)(1) of the Act, 21 U.S.C. section 360bbb-3(b)(1), unless the authorization is terminated or revoked sooner.  Performed at [facility], [place] [address], [place], [place] [address]   Glucose, capillary     Status: Abnormal   Collection Time: 09/27/19  2:07 PM  Result Value Ref Range   Glucose-Capillary 102 (H) 70 - 99 mg/dL  Comment: Glucose reference range applies only to samples taken after fasting for at least 8 hours.  Calculi, with Photograph (to Clinical Lab)     Status: None   Collection Time: 09/27/19  2:53 PM  Result Value Ref Range   Source Calculi Comment     Comment: Left Kidney   Color Calculi Orange    Size Calculi 2x2 mm    Comment: (NOTE) Multiple pieces received.  Dimensions of the largest piece reported.    Weight Calculi 30 mg   Composition Calculi Comment     Comment: Percentage (Represents the % composition)   Calcium Oxalate Monohydrate 10 %   Uric Acid Calculi 90 %   Photo Calculi Comment     Comment: Photograph will follow under a separate cover   Comment Calculi 3 Comment     Comment: (NOTE) Physician questions regarding Calculi Analysis contact [facility] at: [phone].    Please Note: Comment     Comment: (NOTE) Calculi report will follow via computer, mail or courier delivery.    DISCLAIMER: Comment     Comment: (NOTE) This test was developed and its performance characteristics determined by [facility].  It has not been cleared or approved by the Food and Drug Administration. Performed At: [facility] [place], [place] [id number] Thomasene Ripple MD FW:[id number]   Glucose, capillary     Status: Abnormal   Collection Time: 09/27/19  4:41 PM  Result Value Ref Range   Glucose-Capillary 127 (H) 70 - 99 mg/dL    Comment:  Glucose reference range applies only to samples taken after fasting for at least 8 hours.  Urinalysis, Complete     Status: Abnormal   Collection Time: 10/09/19 10:20 AM  Result Value Ref Range   Specific Gravity, UA >1.030 (H) 1.005 - 1.030   pH, UA 5.0 5.0 - 7.5   Color, UA Yellow Yellow   Appearance Ur Cloudy (A) Clear   Leukocytes,UA Trace (A) Negative   Protein,UA 3+ (A) Negative/Trace   Glucose, UA 3+ (A) Negative   Ketones, UA Negative Negative   RBC, UA 3+ (A) Negative   Bilirubin, UA Negative Negative   Urobilinogen, Ur 0.2 0.2 - 1.0 mg/dL   Nitrite, UA Negative Negative   Microscopic Examination See below:   Microscopic Examination     Status: Abnormal   Collection Time: 10/09/19 10:20 AM   Urine  Result Value Ref Range   WBC, UA 11-30 (A) 0 - 5 /hpf   RBC >30 (A) 0 - 2 /hpf   Epithelial Cells (non renal) 0-10 0 - 10 /hpf   Renal Epithel, UA 0-10 (A) None seen /hpf   Casts Present (A) None seen /lpf   Cast Type Hyaline casts N/A   Bacteria, UA Moderate (A) None seen/Few   Objective  Body mass index is 28.31 kg/m. Wt Readings from Last 3 Encounters:  11/12/19 203 lb (92.1 kg)  10/09/19 205 lb (93 kg)  09/27/19 209 lb (94.8 kg)   Temp Readings from Last 3 Encounters:  11/12/19 98.1 F (36.7 C) (Oral)  09/27/19 (!) 97.4 F (36.3 C)  07/10/19 98.2 F (36.8 C) (Oral)   BP Readings from Last 3 Encounters:  11/12/19 134/78  10/09/19 132/69  09/27/19 (!) 145/86   Pulse Readings from Last 3 Encounters:  11/12/19 82  10/09/19 88  09/27/19 77    Physical Exam Vitals and nursing note reviewed.  Constitutional:      Appearance:  Normal appearance. She is well-developed, well-groomed and overweight.  HENT:     Head: Normocephalic and atraumatic.  Eyes:     Conjunctiva/sclera: Conjunctivae normal.     Pupils: Pupils are equal, round, and reactive to light.  Cardiovascular:     Rate and Rhythm: Normal rate and regular rhythm.     Heart sounds: Normal heart  sounds. No murmur heard.   Pulmonary:     Effort: Pulmonary effort is normal.     Breath sounds: Normal breath sounds.  Skin:    General: Skin is warm and dry.  Neurological:     General: No focal deficit present.     Mental Status: She is alert and oriented to person, place, and time. Mental status is at baseline.     Gait: Gait normal.  Psychiatric:        Attention and Perception: Attention and perception normal.        Mood and Affect: Mood and affect normal.        Speech: Speech normal.        Behavior: Behavior normal. Behavior is cooperative.        Thought Content: Thought content normal.        Cognition and Memory: Cognition and memory normal.        Judgment: Judgment normal.     Assessment  Plan  Hypertension associated with diabetes ([place]) - Plan: dapagliflozin propanediol (FARXIGA) 5 MG TABS tablet reduce from 10 cont trulicity 3 mg weekly, Ambulatory referral to Ophthalmology, Comprehensive metabolic panel, Lipid panel, CBC with Differential/Platelet, Hemoglobin A1c amb AE  Cont meds and monitor CBG and BP both improved  Labs today   coreg 25 mg bid, entresto 97-103 ,spironolactone 12.5 mg QAM TAKES MEDS AT 9 AM NOT HAD TODAY BP at home 107-140s/150s (in am before meds)   Positive colorectal cancer screening using Cologuard test - Plan: Ambulatory referral to Gastroenterology   Dysphonia - Plan: Ambulatory referral to ENT [facility]  HM Flu utd given today Tdap and shingrix consider pna 23 vaccine utd  covid 2/2 had utd booster 01/13/20   mammo orderedcall to schedule Pap 02/05/2018 negative  Colonoscopy never haddue to anxietywill do cologaurd ordered +07/29/19 + Referred colonoscopy   Urology Dr. Diamantina [facility] had kidney stone surgery 10/09/19 and stent removal  10/16/19 MNG thyroid bx 10/16/19 bx negative [facility]   Provider: Dr. Olivia Mackie McLean-Scocuzza-Internal Medicine

## 2019-11-14 ENCOUNTER — Ambulatory Visit
Admission: RE | Admit: 2019-11-14 | Discharge: 2019-11-14 | Disposition: A | Discharge status: Home / Self Care | Payer: Medicare Other | Source: Ambulatory Visit | Attending: Urology | Admitting: Urology

## 2019-11-14 ENCOUNTER — Other Ambulatory Visit: Payer: Self-pay

## 2019-11-14 DIAGNOSIS — R319 Hematuria, unspecified: Secondary | ICD-10-CM | POA: Diagnosis not present

## 2019-11-14 DIAGNOSIS — N281 Cyst of kidney, acquired: Secondary | ICD-10-CM | POA: Diagnosis not present

## 2019-11-14 DIAGNOSIS — Z466 Encounter for fitting and adjustment of urinary device: Secondary | ICD-10-CM | POA: Diagnosis not present

## 2019-11-15 ENCOUNTER — Ambulatory Visit (INDEPENDENT_AMBULATORY_CARE_PROVIDER_SITE_OTHER): Payer: Medicare Other | Admitting: Cardiology

## 2019-11-15 ENCOUNTER — Encounter: Payer: Self-pay | Admitting: Cardiology

## 2019-11-15 VITALS — BP 140/80 | HR 88 | Ht 71.0 in | Wt 202.5 lb

## 2019-11-15 DIAGNOSIS — I428 Other cardiomyopathies: Secondary | ICD-10-CM

## 2019-11-15 DIAGNOSIS — I10 Essential (primary) hypertension: Secondary | ICD-10-CM

## 2019-11-15 DIAGNOSIS — E7800 Pure hypercholesterolemia, unspecified: Secondary | ICD-10-CM

## 2019-11-15 MED ORDER — SPIRONOLACTONE 25 MG PO TABS: 25.0000 mg | ORAL_TABLET | Freq: Every day | ORAL | 2 refills | Status: DC

## 2019-11-15 NOTE — Progress Notes (Signed)
Cardiology Office Note:    Date:  11/15/2019   ID:  Nancy Barajas, DOB [birth year], MRN [id number]  PCP:  McLean-Scocuzza, Nino Glow, MD  Cardiologist:  Kate Sable, MD  Electrophysiologist:  None   Referring MD: McLean-Scocuzza, Olivia Mackie *   Chief Complaint  Patient presents with  . other    2 month follow up. Meds reviewed by the pt. verbally. "doing well."     History of Present Illness:    Nancy Barajas is a 59 y.o. female with a hx of hypertension, NICM, initial EF 25-30%, improved to 40-45% on GDMT, diabetes, hyperlipidemia, CKD stage III, who presents for follow-up.    Being seen for nonischemic cardiomyopathy.  Occasions have been uptitrated as tolerated.  Patient currently on good GDMT.  Tolerating medications with no side effects, denies shortness of breath, edema.  States doing well, states her blood pressures have been running a little high with systolics in the 626R to 485I.  Has no other concerns at this time  Prior notes Echo 03/20/2019 EF 25 to 30% Left heart catheter 04/2019 no evidence of CAD, normal coronary arteries Echo 09/2019 EF 40 to 45%.   Past Medical History:  Diagnosis Date  . Anxiety   . Asthma   . Depression   . Diabetes mellitus without complication ([place])   . History of kidney stones   . Hypertension   . Kidney stones   . Vertigo    when laying flat  . Vocal cord dysfunction    makes it hard for patient to talk and breathe    Past Surgical History:  Procedure Laterality Date  . CARDIAC CATHETERIZATION    . CYSTOSCOPY W/ RETROGRADES Right 09/27/2019   Procedure: CYSTOSCOPY WITH RETROGRADE PYELOGRAM;  Surgeon: Billey Co, MD;  Location: [facility];  Service: Urology;  Laterality: Right;  . CYSTOSCOPY/URETEROSCOPY/HOLMIUM LASER/STENT PLACEMENT Left 09/27/2019   Procedure: CYSTOSCOPY/URETEROSCOPY/HOLMIUM LASER/STENT PLACEMENT;  Surgeon: Billey Co, MD;  Location: [facility];  Service: Urology;  Laterality: Left;  . LITHOTRIPSY     . RIGHT/LEFT HEART CATH AND CORONARY ANGIOGRAPHY Bilateral 04/08/2019   Procedure: RIGHT/LEFT HEART CATH AND CORONARY ANGIOGRAPHY;  Surgeon: Wellington Hampshire, MD;  Location: [place] [facility];  Service: Cardiovascular;  Laterality: Bilateral;    Current Medications: Current Meds  Medication Sig  . blood glucose meter kit and supplies Dispense based on patient and insurance preference. Use up to four times daily as directed. (FOR ICD-10 E10.9, E11.9).  . carvedilol (COREG) 25 MG tablet Take 1 tablet (25 mg total) by mouth 2 (two) times daily with a meal.  . Cholecalciferol (VITAMIN D-3) 125 MCG (5000 UT) TABS Take 1 tablet by mouth daily. (Patient taking differently: Take 5,000 Units by mouth daily. )  . dapagliflozin propanediol (FARXIGA) 5 MG TABS tablet Take 1 tablet (5 mg total) by mouth daily before breakfast.  . Dulaglutide (TRULICITY) 3 OE/7.0JJ SOPN Inject 3 mg into the skin once a week.  . insulin lispro (HUMALOG KWIKPEN) 100 UNIT/ML KwikPen Check sugar 15-20 minutes before meals and give insulin based on sugar reading 70-130 0 units, 131-180 4 units, 181-240 8 units, 241-300 10 units, 301-350 12 units, 351-400 16 units, >400 20 units and call the doctor  . Insulin Pen Needle (PEN NEEDLES) 31G X 8 MM MISC 1 Device by Does not apply route in the morning, at noon, and at bedtime. Use for humalog  . Multiple Vitamins-Minerals (CENTRUM SILVER 50+WOMEN PO) Take 1 tablet by mouth daily.  Marland Kitchen  mupirocin ointment (BACTROBAN) 2 % Apply 1 application topically 3 (three) times daily.  . ReliOn Ultra Thin Lancets MISC 1 Device by Does not apply route 4 (four) times daily -  before meals and at bedtime. relion brand  . rosuvastatin (CRESTOR) 10 MG tablet Take 10 mg by mouth every evening.   . sacubitril-valsartan (ENTRESTO) 97-103 MG Take 1 tablet by mouth 2 (two) times daily.  . sertraline (ZOLOFT) 100 MG tablet Take 1.5 tablets (150 mg total) by mouth daily.  . sodium bicarbonate 650 MG tablet  Take 1 tablet (650 mg total) by mouth 2 (two) times daily.  . sodium chloride (OCEAN) 0.65 % SOLN nasal spray Place 1 spray into both nostrils as needed for congestion.   Marland Kitchen spironolactone (ALDACTONE) 25 MG tablet Take 1 tablet (25 mg total) by mouth daily.  . [DISCONTINUED] spironolactone (ALDACTONE) 25 MG tablet Take 0.5 tablets (12.5 mg total) by mouth daily.     Allergies:   Penicillins   Social History   Socioeconomic History  . Marital status: Divorced    Spouse name: Not on file  . Number of children: 2  . Years of education: Not on file  . Highest education level: 9th grade  Occupational History  . Not on file  Tobacco Use  . Smoking status: Former Smoker    Packs/day: 1.00    Years: 10.00    Pack years: 10.00    Quit date: 01/03/1998    Years since quitting: 21.8  . Smokeless tobacco: Never Used  Vaping Use  . Vaping Use: Never used  Substance and Sexual Activity  . Alcohol use: Not Currently  . Drug use: Not on file    Comment: years ago  . Sexual activity: Not Currently  Other Topics Concern  . Not on file  Social History Narrative   Lives at home    2 daughters as of 04/04/19 59 y.o daughter lives with her (she works)   Patient is on disability.   Social Determinants of Health   Financial Resource Strain: High Risk  . Difficulty of Paying Living Expenses: Hard  Food Insecurity: No Food Insecurity  . Worried About Charity fundraiser in the Last Year: Never true  . Ran Out of Food in the Last Year: Never true  Transportation Needs: No Transportation Needs  . Lack of Transportation (Medical): No  . Lack of Transportation (Non-Medical): No  Physical Activity: Unknown  . Days of Exercise per Week: 0 days  . Minutes of Exercise per Session: Not on file  Stress: No Stress Concern Present  . Feeling of Stress : Only a little  Social Connections: Unknown  . Frequency of Communication with Friends and Family: Three times a week  . Frequency of Social Gatherings  with Friends and Family: More than three times a week  . Attends Religious Services: Never  . Active Member of Clubs or Organizations: Yes  . Attends Archivist Meetings: Never  . Marital Status: Not on file     Family History: The patient's family history includes Diabetes in her father and mother; Hypertension in her father and mother.  ROS:   Please see the history of present illness.     All other systems reviewed and are negative.  EKGs/Labs/Other Studies Reviewed:    The following studies were reviewed today:   EKG:  EKG is  ordered today.  The ekg ordered today demonstrates normal sinus rhythm, left bundle branch block  Recent Labs: 11/12/2019:  ALT 14; BUN 26; Creatinine, Ser 1.19; Hemoglobin 12.8; Platelets 284.0; Potassium 3.9; Sodium 139; TSH 0.85  Recent Lipid Panel    Component Value Date/Time   CHOL 186 11/12/2019 0947   TRIG 67.0 11/12/2019 0947   HDL 87.70 11/12/2019 0947   CHOLHDL 2 11/12/2019 0947   VLDL 13.4 11/12/2019 0947   LDLCALC 85 11/12/2019 0947    Physical Exam:    VS:  BP 140/80 (BP Location: Left Arm, Patient Position: Sitting, Cuff Size: Normal)   Pulse 88   Ht '5\' 11"'  (1.803 m)   Wt 202 lb 8 oz (91.9 kg)   LMP 01/03/2013   SpO2 98%   BMI 28.24 kg/m     Wt Readings from Last 3 Encounters:  11/15/19 202 lb 8 oz (91.9 kg)  11/12/19 203 lb (92.1 kg)  10/09/19 205 lb (93 kg)     GEN:  Well nourished, well developed in no acute distress, obese HEENT: Normal NECK: No JVD; No carotid bruits LYMPHATICS: No lymphadenopathy CARDIAC: RRR, no murmurs, rubs, gallops RESPIRATORY:  Clear to auscultation without rales, wheezing or rhonchi  ABDOMEN: Soft, non-tender, non-distended MUSCULOSKELETAL:  No edema; No deformity  SKIN: Warm and dry NEUROLOGIC:  Alert and oriented x 3 PSYCHIATRIC:  Normal affect   ASSESSMENT:    1. NICM (nonischemic cardiomyopathy) ([place])   2. Essential hypertension   3. Pure hypercholesterolemia    PLAN:     In order of problems listed above:  1. Nonischemic cardiomyopathy, initial EF 25 to 30%.  Placed on GDMT including Entresto, spironolactone, Coreg, last echo 09/10/2019 showing improvement in ejection fraction now 40 %.  Patient describes NYHA class I-II symptoms.  Increase Aldactone to 25 mg daily.  Continue Coreg 25 mg twice daily, Entresto 97-103 mg twice daily, Farxiga.  Patient is euvolemic, no indication for Lasix at this time. 2. History of hypertension, blood pressure is now controlled.  Continue Coreg 25 mg twice daily, Entresto, Aldactone 25 mg daily. 3. hyperlipidemia, continue Crestor as prescribed  Follow-up in 6 months   This note was generated in part or whole with voice recognition software. Voice recognition is usually quite accurate but there are transcription errors that can and very often do occur. I apologize for any typographical errors that were not detected and corrected.  Medication Adjustments/Labs and Tests Ordered: Current medicines are reviewed at length with the patient today.  Concerns regarding medicines are outlined above.  No orders of the defined types were placed in this encounter.  Meds ordered this encounter  Medications  . spironolactone (ALDACTONE) 25 MG tablet    Sig: Take 1 tablet (25 mg total) by mouth daily.    Dispense:  90 tablet    Refill:  2    Patient Instructions  Medication Instructions:   Your physician has recommended you make the following change in your medication:   INCREASE Spironolactone (Aldactone) to 74m (1 TABLET) Once Daily  *If you need a refill on your cardiac medications before your next appointment, please call your pharmacy*   Lab Work: None ordered.   Testing/Procedures: None ordered.   Follow-Up: [facility] and your [facility] needs are our priority.  As part of our continuing mission to provide you with exceptional heart care, we have created designated Provider Care Teams.  These Care Teams  include your primary Cardiologist (physician) and Advanced Practice Providers (APPs -  Physician Assistants and Nurse Practitioners) who all work together to provide you with the care you need,  when you need it.  We recommend signing up for the patient portal called "MyChart".  Sign up information is provided on this After Visit Summary.  MyChart is used to connect with patients for Virtual Visits (Telemedicine).  Patients are able to view lab/test results, encounter notes, upcoming appointments, etc.  Non-urgent messages can be sent to your provider as well.   To learn more about what you can do with MyChart, go to [email or web address].    Your next appointment:   6 month(s)  The format for your next appointment:   In Person  Provider:   Kate Sable, MD        Signed, Kate Sable, MD  11/15/2019 1:06 PM    [place]

## 2019-11-15 NOTE — Patient Instructions (Signed)
Medication Instructions:   Your physician has recommended you make the following change in your medication:   INCREASE Spironolactone (Aldactone) to 25mg  (1 TABLET) Once Daily  *If you need a refill on your cardiac medications before your next appointment, please call your pharmacy*   Lab Work: None ordered.   Testing/Procedures: None ordered.   Follow-Up: At [facility], you and your health needs are our priority.  As part of our continuing mission to provide you with exceptional heart care, we have created designated Provider Care Teams.  These Care Teams include your primary Cardiologist (physician) and Advanced Practice Providers (APPs -  Physician Assistants and Nurse Practitioners) who all work together to provide you with the care you need, when you need it.  We recommend signing up for the patient portal called "MyChart".  Sign up information is provided on this After Visit Summary.  MyChart is used to connect with patients for Virtual Visits (Telemedicine).  Patients are able to view lab/test results, encounter notes, upcoming appointments, etc.  Non-urgent messages can be sent to your provider as well.   To learn more about what you can do with MyChart, go to [email or web address].    Your next appointment:   6 month(s)  The format for your next appointment:   In Person  Provider:   Kate Sable, MD

## 2019-11-19 ENCOUNTER — Ambulatory Visit: Payer: Medicare Other | Admitting: Pharmacist

## 2019-11-19 DIAGNOSIS — I10 Essential (primary) hypertension: Secondary | ICD-10-CM

## 2019-11-19 DIAGNOSIS — N183 Chronic kidney disease, stage 3 unspecified: Secondary | ICD-10-CM

## 2019-11-19 DIAGNOSIS — E1122 Type 2 diabetes mellitus with diabetic chronic kidney disease: Secondary | ICD-10-CM

## 2019-11-19 DIAGNOSIS — E119 Type 2 diabetes mellitus without complications: Secondary | ICD-10-CM

## 2019-11-19 NOTE — Chronic Care Management (AMB) (Signed)
Chronic Care Management   Pharmacy Note  11/19/2019 Name: Nancy Barajas MRN: [id number] DOB: 1960-12-25   Subjective:  Nancy Barajas is a 59 y.o. year old female who is a primary care patient of McLean-Scocuzza, Nino Glow, MD. The CCM team was consulted for assistance with chronic disease management and care coordination needs.    Engaged with patient by telephone for follow up visit in response to provider referral for pharmacy case management and/or care coordination services.   Consent to Services:  Nancy Barajas was given information about Chronic Care Management services, agreed to services, and gave verbal consent prior to initiation of services on 08/27/18. Please see initial visit note for detailed documentation.   Review of patient status, including review of consultants reports, laboratory and other test data, was performed as part of comprehensive evaluation and provision of chronic care management services.   SDOH (Social Determinants of Health) assessments and interventions performed:  SDOH Interventions     Most Recent Value  SDOH Interventions  Financial Strain Interventions Other (Comment)  [care guide referral]       Objective:  Lab Results  Component Value Date   CREATININE 1.19 11/12/2019   CREATININE 1.21 (H) 05/14/2019   CREATININE 1.24 (H) 04/02/2019    Lab Results  Component Value Date   HGBA1C 6.4 11/12/2019       Component Value Date/Time   CHOL 186 11/12/2019 0947   TRIG 67.0 11/12/2019 0947   HDL 87.70 11/12/2019 0947   CHOLHDL 2 11/12/2019 0947   VLDL 13.4 11/12/2019 0947   LDLCALC 85 11/12/2019 0947    Clinical ASCVD: No  The ASCVD Risk score (Goff DC Jr., et al., 2013) failed to calculate for the following reasons:   Unable to determine if patient is Non-Hispanic African American     BP Readings from Last 3 Encounters:  11/15/19 140/80  11/12/19 134/78  10/09/19 132/69    Assessment:   Allergies  Allergen Reactions  .  Penicillins Rash    Did it involve swelling of the face/tongue/throat, SOB, or low BP? No Did it involve sudden or severe rash/hives, skin peeling, or any reaction on the inside of your mouth or nose? No Did you need to seek medical attention at a hospital or doctor's office? No When did it last happen?Childhood If all above answers are "NO", may proceed with cephalosporin use.    Medications Reviewed Today    Reviewed by Kate Sable, MD (Physician) on 11/15/19 at 1009  Med List Status: <None>  Medication Order Taking? Sig Documenting Provider Last Dose Status Informant  blood glucose meter kit and supplies [id number] Yes Dispense based on patient and insurance preference. Use up to four times daily as directed. (FOR ICD-10 E10.9, E11.9). Jodelle Green, FNP Taking Active Self  carvedilol (COREG) 25 MG tablet [id number] Yes Take 1 tablet (25 mg total) by mouth 2 (two) times daily with a meal. McLean-Scocuzza, Nino Glow, MD Taking Active Self  Cholecalciferol (VITAMIN D-3) 125 MCG (5000 UT) TABS [id number] Yes Take 1 tablet by mouth daily.  Patient taking differently: Take 5,000 Units by mouth daily.    Jodelle Green, FNP Taking Active Self  dapagliflozin propanediol (FARXIGA) 5 MG TABS tablet [id number] Yes Take 1 tablet (5 mg total) by mouth daily before breakfast. McLean-Scocuzza, Nino Glow, MD Taking Active   Dulaglutide (TRULICITY) 3 FB/5.1WC SOPN [id number] Yes Inject 3 mg into the skin once a week. McLean-Scocuzza, Nino Glow, MD Taking Active Self  insulin lispro (HUMALOG  KWIKPEN) 100 UNIT/ML KwikPen [id number] Yes Check sugar 15-20 minutes before meals and give insulin based on sugar reading 70-130 0 units, 131-180 4 units, 181-240 8 units, 241-300 10 units, 301-350 12 units, 351-400 16 units, >400 20 units and call the doctor McLean-Scocuzza, Nino Glow, MD Taking Active Self           Med Note Mayo Ao Oct 07, 2019 12:48 PM)    Insulin Pen Needle (PEN NEEDLES) 31G X 8 MM  MISC [id number] Yes 1 Device by Does not apply route in the morning, at noon, and at bedtime. Use for humalog McLean-Scocuzza, Nino Glow, MD Taking Active Self  Multiple Vitamins-Minerals (CENTRUM SILVER 50+WOMEN PO) [id number] Yes Take 1 tablet by mouth daily. [provider] Taking Active Self  mupirocin ointment (BACTROBAN) 2 % [id number] Yes Apply 1 application topically 3 (three) times daily. McLean-Scocuzza, Nino Glow, MD Taking Active Self  ReliOn Ultra Thin Lancets MISC [id number] Yes 1 Device by Does not apply route 4 (four) times daily -  before meals and at bedtime. relion brand McLean-Scocuzza, Nino Glow, MD Taking Active Self  rosuvastatin (CRESTOR) 10 MG tablet [id number] Yes Take 10 mg by mouth every evening.  [provider] Taking Active Self           Med Note (NEWCOMER MCCLAIN, BRANDY L   Thu Dec 06, 2018 11:10 AM)    sacubitril-valsartan (ENTRESTO) 97-103 MG [id number] Yes Take 1 tablet by mouth 2 (two) times daily. Kate Sable, MD Taking Active   sertraline (ZOLOFT) 100 MG tablet [id number] Yes Take 1.5 tablets (150 mg total) by mouth daily. Pucilowski, Marchia Bond, MD Taking Active   sodium bicarbonate 650 MG tablet [id number] Yes Take 1 tablet (650 mg total) by mouth 2 (two) times daily. Billey Co, MD Taking Active   sodium chloride (OCEAN) 0.65 % SOLN nasal spray [id number] Yes Place 1 spray into both nostrils as needed for congestion.  [provider] Taking Active Self  spironolactone (ALDACTONE) 25 MG tablet [id number] Yes Take 0.5 tablets (12.5 mg total) by mouth daily. McLean-Scocuzza, Nino Glow, MD Taking Active Self           Med Note Lauraine Rinne Aug 14, 2019  9:43 AM)            Patient Active Problem List   Diagnosis Date Noted  . Obstructive uropathy 08/01/2019  . Gallstones 07/28/2019  . Umbilical hernia 94/70/7615  . Hematuria 07/15/2019  . Hypertension associated with diabetes ([place]) 07/11/2019  . Vitamin D deficiency  07/11/2019  . Hiatal hernia 07/11/2019  . Obesity (BMI 30.0-34.9) 07/11/2019  . Heart failure with reduced ejection fraction ([place])   . GAD (generalized anxiety disorder) 04/06/2019  . Proteinuria 03/04/2019  . Aortic atherosclerosis ([place]) 03/04/2019  . Thyroid nodule 03/04/2019  . Kidney cysts 03/04/2019  . Hemangioma of liver 03/04/2019  . Sinusitis 03/04/2019  . Kidney stones   . CKD stage 3 secondary to diabetes ([place]) 08/22/2018  . Laryngeal spasm 05/07/2018  . Muscle tension dysphonia 05/07/2018  . Shortness of breath 01/22/2018  . Uncontrolled hypertension 01/22/2018  . Type 2 diabetes mellitus without complication, without long-term current use of insulin ([place]) 01/22/2018  . Mixed hyperlipidemia 01/22/2018  . Mild persistent asthma 01/22/2018    Medication Assistance: None required. Patient affirms current coverage meets needs.  Patient has Medicare Extra Help  Patient Care Plan: Medication Management    Problem Identified: Diabetes, Heart Failure  Priority: Medium  Onset Date: 10/07/2019    Long-Range Goal: Disease Progression Prevention   Start Date: 10/07/2019  Expected End Date: 12/06/2019  Note:   Current Barriers:  . Unable to independently afford treatment regimen . Complex patient with multiple disease states at increased risk for disease progression  Pharmacist Clinical Goal(s):  Marland Kitchen Over the next 90 days, patient will maintain control of diabetes and hypertension as evidenced by maintenance of lab work through collaboration with PharmD and provider.   Interventions: . Inter-disciplinary care team collaboration (see longitudinal plan of care) . Comprehensive medication review performed; medication list updated in electronic medical record  Diabetes: . Controlled; current treatment: Trulicity 3 mg weekly, Farxiga 5 mg daily   . Notes that Libre CGM has become too expensive for her to continue to use. Cost is ~$40/month with her insurance . Discussed that her  current regimen does not require CGM level of monitoring to prevent hypoglycemia. If cost is too expensive for her, it would be acceptable to discontinue use of CGM. Patient verbalized understanding.  . Recommended to continue current regimen. Encouraged to check glucose periodically, fasting and post prandial.  . Notes that she called [facility] as previously recommended but didn't understand the instructions she was provided. Care Guide referral placed for resource support w/ navigating Open Enrollment.  Hypertension with HFrEF (most recent EF 25-30%) . Uncontrolled though likely improved d/t recent medication changes; current treatment: carvedilol 25 mg BID, spironolactone 25 mg daily (just increased by Dr. Garen Lah d/t hypertension at the office), Entresto 97/103 mg BID  . Appreciates blood pressure machine that was able to be sent to her to allow home BP checks.  . Recommended continue current regimen and collaboration w/ cardiology.  Encouraged continued home BP checks  Hyperlipidemia: . Controlled; current treatment: rosuvastatin 10 mg daily  . Recommended to continue current regimen  Depression/Anxiety: . Improved per patient report; current treatment: sertraline 100 mg daily. Not using trazodone often  Patient Goals/Self-Care Activities . Over the next 90 days, patient will:  - take medications as prescribed check blood glucose periodically, 3-5 times weekly, document, and provide at future appointments check blood pressure daily, document, and provide at future appointments  Follow Up Plan: Telephone follow up appointment with care management team member scheduled for: ~ 4 weeks           Plan: Telephone follow up appointment with care management team member scheduled for: ~ 3 weeks  [name], PharmD, [place], [place] Pharmacist [place] [phone]

## 2019-11-19 NOTE — Patient Instructions (Signed)
Visit Information  Patient Care Plan: Medication Management    Problem Identified: Diabetes, Heart Failure   Priority: Medium  Onset Date: 10/07/2019    Long-Range Goal: Disease Progression Prevention   Start Date: 10/07/2019  Expected End Date: 12/06/2019  Note:   Current Barriers:  . Unable to independently afford treatment regimen . Complex patient with multiple disease states at increased risk for disease progression  Pharmacist Clinical Goal(s):  Marland Kitchen Over the next 90 days, patient will maintain control of diabetes and hypertension as evidenced by maintenance of lab work through collaboration with PharmD and provider.   Interventions: . Inter-disciplinary care team collaboration (see longitudinal plan of care) . Comprehensive medication review performed; medication list updated in electronic medical record  Diabetes: . Controlled; current treatment: Trulicity 3 mg weekly, Farxiga 5 mg daily   . Notes that Libre CGM has become too expensive for her to continue to use. Cost is ~$40/month with her insurance . Discussed that her current regimen does not require CGM level of monitoring to prevent hypoglycemia. If cost is too expensive for her, it would be acceptable to discontinue use of CGM. Patient verbalized understanding.  . Recommended to continue current regimen. Encouraged to check glucose periodically, fasting and post prandial.  . Notes that she called [facility] as previously recommended but didn't understand the instructions she was provided. Care Guide referral placed for resource support w/ navigating Open Enrollment.  Hypertension with HFrEF (most recent EF 25-30%) . Uncontrolled though likely improved d/t recent medication changes; current treatment: carvedilol 25 mg BID, spironolactone 25 mg daily (just increased by Dr. Garen Lah d/t hypertension at the office), Entresto 97/103 mg BID  . Appreciates blood pressure machine that was able to be sent to her to allow home BP  checks.  . Recommended continue current regimen and collaboration w/ cardiology.  Encouraged continued home BP checks  Hyperlipidemia: . Controlled; current treatment: rosuvastatin 10 mg daily  . Recommended to continue current regimen  Depression/Anxiety: . Improved per patient report; current treatment: sertraline 100 mg daily. Not using trazodone often  Patient Goals/Self-Care Activities . Over the next 90 days, patient will:  - take medications as prescribed check blood glucose periodically, 3-5 times weekly, document, and provide at future appointments check blood pressure daily, document, and provide at future appointments  Follow Up Plan: Telephone follow up appointment with care management team member scheduled for: ~ 4 weeks       The patient verbalized understanding of instructions, educational materials, and care plan provided today and declined offer to receive copy of patient instructions, educational materials, and care plan.   Plan: Telephone follow up appointment with care management team member scheduled for: ~ 3 weeks  [name], PharmD, [place], [place] Pharmacist [facility] [phone]

## 2019-11-20 ENCOUNTER — Other Ambulatory Visit: Payer: Self-pay

## 2019-11-20 ENCOUNTER — Encounter: Payer: Self-pay | Admitting: Urology

## 2019-11-20 ENCOUNTER — Other Ambulatory Visit: Payer: Self-pay | Admitting: Urology

## 2019-11-20 ENCOUNTER — Ambulatory Visit (INDEPENDENT_AMBULATORY_CARE_PROVIDER_SITE_OTHER): Payer: Medicare Other | Admitting: Urology

## 2019-11-20 VITALS — BP 89/53 | HR 94 | Ht 71.0 in | Wt 200.0 lb

## 2019-11-20 DIAGNOSIS — N200 Calculus of kidney: Secondary | ICD-10-CM

## 2019-11-20 DIAGNOSIS — N3281 Overactive bladder: Secondary | ICD-10-CM | POA: Diagnosis not present

## 2019-11-20 MED ORDER — OXYBUTYNIN CHLORIDE ER 10 MG PO TB24: 10.0000 mg | ORAL_TABLET | Freq: Every day | ORAL | 3 refills | Status: DC

## 2019-11-20 NOTE — Progress Notes (Signed)
   11/20/2019 10:47 AM   Nancy Barajas 17-Apr-1960 [id number]  Reason for visit: Follow up nephrolithiasis, OAB  HPI: I saw Nancy Barajas in urology clinic to review her post ureteroscopy renal ultrasound.  Briefly, she is a 59 year old female with a history of more than 20 prior stone episodes who recently underwent uncomplicated left ureteroscopy and laser lithotripsy on 09/27/2019 for a 2 cm left renal stone.  Stone analysis showed 90% uric acid.  We started her on alkalinization therapy with sodium bicarbonate twice daily.  I personally viewed and interpreted her renal ultrasound today that shows no hydronephrosis or significant residual stone fragments.  She denies any flank pain or gross hematuria.  She does note 3 to 4 months of overactive bladder symptoms with urgency, frequency, and urge incontinence.  She drinks only water during the day.  She does not have significant urinary symptoms overnight.  She has mild to no stress incontinence.  She is interested in trying a medication for her OAB symptoms.  We discussed that overactive bladder (OAB) is not a disease, but is a symptom complex that is generally not life-threatening.  Symptoms typically include urinary urgency, frequency, and urge incontinence.  There are numerous treatment options, however there are risks and benefits with both medical and surgical management.  First-line treatment is behavioral therapies including bladder training, pelvic floor muscle training, and fluid management.  Second line treatments include oral antimuscarinics(Ditropan er, Trospium) and beta-3 agonist (Mybetriq). There is typically a period of medication trial (4-8 weeks) to find the optimal therapy and dosing. If symptoms are bothersome despite the above management, third line options include intra-detrusor botox, peripheral tibial nerve stimulation (PTNS), and interstim (SNS). These are more invasive treatments with higher side effect profile, but may  improve quality of life for patients with severe OAB symptoms.   Trial of oxybutynin 10 mg XL daily RTC 6 weeks symptom check for OAB, repeat urinalysis to evaluate pH on alkalinization therapy  Billey Co, MD  [place] [address] [place],  [address] [phone]

## 2019-11-20 NOTE — Patient Instructions (Signed)
1.  To prevent recurrent kidney stones it is very important to increase fluid intake, adding lemon juice to the water can be helpful, as well as Crystal light lemonade.  Continue to take the sodium bicarbonate to prevent kidney stones.  2.  Try oxybutynin daily to help with overactive bladder symptoms of urgency, frequency, and leakage  Overactive Bladder, Adult  Overactive bladder refers to a condition in which a person has a sudden need to pass urine. The person may leak urine if he or she cannot get to the bathroom fast enough (urinary incontinence). A person with this condition may also wake up several times in the night to go to the bathroom. Overactive bladder is associated with poor nerve signals between your bladder and your brain. Your bladder may get the signal to empty before it is full. You may also have very sensitive muscles that make your bladder squeeze too soon. These symptoms might interfere with daily work or social activities. What are the causes? This condition may be associated with or caused by:  Urinary tract infection.  Infection of nearby tissues, such as the prostate.  Prostate enlargement.  Surgery on the uterus or urethra.  Bladder stones, inflammation, or tumors.  Drinking too much caffeine or alcohol.  Certain medicines, especially medicines that get rid of extra fluid in the body (diuretics).  Muscle or nerve weakness, especially from: ? A spinal cord injury. ? Stroke. ? Multiple sclerosis. ? Parkinson's disease.  Diabetes.  Constipation. What increases the risk? You may be at greater risk for overactive bladder if you:  Are an older adult.  Smoke.  Are going through menopause.  Have prostate problems.  Have a neurological disease, such as stroke, dementia, Parkinson's disease, or multiple sclerosis (MS).  Eat or drink things that irritate the bladder. These include alcohol, spicy food, and caffeine.  Are overweight or obese. What are  the signs or symptoms? Symptoms of this condition include:  Sudden, strong urge to urinate.  Leaking urine.  Urinating 8 or more times a day.  Waking up to urinate 2 or more times a night. How is this diagnosed? Your health care provider may suspect overactive bladder based on your symptoms. He or she will diagnose this condition by:  A physical exam and medical history.  Blood or urine tests. You might need bladder or urine tests to help determine what is causing your overactive bladder. You might also need to see a health care provider who specializes in urinary tract problems (urologist). How is this treated? Treatment for overactive bladder depends on the cause of your condition and whether it is mild or severe. You can also make lifestyle changes at home. Options include:  Bladder training. This may include: ? Learning to control the urge to urinate by following a schedule that directs you to urinate at regular intervals (timed voiding). ? Doing Kegel exercises to strengthen your pelvic floor muscles, which support your bladder. Toning these muscles can help you control urination, even if your bladder muscles are overactive.  Special devices. This may include: ? Biofeedback, which uses sensors to help you become aware of your body's signals. ? Electrical stimulation, which uses electrodes placed inside the body (implanted) or outside the body. These electrodes send gentle pulses of electricity to strengthen the nerves or muscles that control the bladder. ? Women may use a plastic device that fits into the vagina and supports the bladder (pessary).  Medicines. ? Antibiotics to treat bladder infection. ? Antispasmodics to  stop the bladder from releasing urine at the wrong time. ? Tricyclic antidepressants to relax bladder muscles. ? Injections of botulinum toxin type A directly into the bladder tissue to relax bladder muscles.  Lifestyle changes. This may include: ? Weight  loss. Talk to your health care provider about weight loss methods that would work best for you. ? Diet changes. This may include reducing how much alcohol and caffeine you consume, or drinking fluids at different times of the day. ? Not smoking. Do not use any products that contain nicotine or tobacco, such as cigarettes and e-cigarettes. If you need help quitting, ask your health care provider.  Surgery. ? A device may be implanted to help manage the nerve signals that control urination. ? An electrode may be implanted to stimulate electrical signals in the bladder. ? A procedure may be done to change the shape of the bladder. This is done only in very severe cases. Follow these instructions at home: Lifestyle  Make any diet or lifestyle changes that are recommended by your health care provider. These may include: ? Drinking less fluid or drinking fluids at different times of the day. ? Cutting down on caffeine or alcohol. ? Doing Kegel exercises. ? Losing weight if needed. ? Eating a healthy and balanced diet to prevent constipation. This may include:  Eating foods that are high in fiber, such as fresh fruits and vegetables, whole grains, and beans.  Limiting foods that are high in fat and processed sugars, such as fried and sweet foods. General instructions  Take over-the-counter and prescription medicines only as told by your health care provider.  If you were prescribed an antibiotic medicine, take it as told by your health care provider. Do not stop taking the antibiotic even if you start to feel better.  Use any implants or pessary as told by your health care provider.  If needed, wear pads to absorb urine leakage.  Keep a journal or log to track how much and when you drink and when you feel the need to urinate. This will help your health care provider monitor your condition.  Keep all follow-up visits as told by your health care provider. This is important. Contact a health  care provider if:  You have a fever.  Your symptoms do not get better with treatment.  Your pain and discomfort get worse.  You have more frequent urges to urinate. Get help right away if:  You are not able to control your bladder. Summary  Overactive bladder refers to a condition in which a person has a sudden need to pass urine.  Several conditions may lead to an overactive bladder.  Treatment for overactive bladder depends on the cause and severity of your condition.  Follow your health care provider's instructions about lifestyle changes, doing Kegel exercises, keeping a journal, and taking medicines. This information is not intended to replace advice given to you by your health care provider. Make sure you discuss any questions you have with your health care provider. Document Revised: 04/12/2018 Document Reviewed: 01/05/2017 Elsevier Patient Education  [place].

## 2019-11-21 ENCOUNTER — Telehealth: Payer: Self-pay

## 2019-11-21 NOTE — Telephone Encounter (Signed)
    MA11/18/2021 1st Attempt  Name: Nancy Barajas   MRN: [id number]   DOB: 1960/10/06   AGE: 59 y.o.   GENDER: female   PCP McLean-Scocuzza, Nancy Glow, MD.   11/21/19 Spoke with patient about [facility] counselor at [facility].  Will follow-up with patient within the next 7 days.    Angely Dietz, AAS Paralegal, [place] . Embedded Care Coordination [facility]  Care Management  [address], [place] [address] [email or web address]  [phone]   [email or web address]

## 2019-11-25 ENCOUNTER — Encounter: Payer: Self-pay | Admitting: Internal Medicine

## 2019-11-25 ENCOUNTER — Telehealth: Payer: Self-pay

## 2019-11-25 ENCOUNTER — Ambulatory Visit (INDEPENDENT_AMBULATORY_CARE_PROVIDER_SITE_OTHER): Payer: Medicare Other | Admitting: Psychology

## 2019-11-25 ENCOUNTER — Telehealth: Payer: Self-pay | Admitting: Internal Medicine

## 2019-11-25 DIAGNOSIS — F418 Other specified anxiety disorders: Secondary | ICD-10-CM

## 2019-11-25 NOTE — Telephone Encounter (Signed)
Urology appt 11/20/19 BP 80s/50s What is BP today  If <90/<60 rec reduce coreg 25 mg 2x per  Cut in 1/2 pill 2x per day    What is BP? Have pt check and call us back? Has she taken meds?

## 2019-11-25 NOTE — Telephone Encounter (Signed)
Noted Dr. Olivia Mackie McLean-scocuzza

## 2019-11-25 NOTE — Telephone Encounter (Signed)
    MA11/22/2021   Name: Nancy Barajas   MRN: [id number]   DOB: 03-16-1960   AGE: 59 y.o.   GENDER: female   PCP McLean-Scocuzza, Nino Glow, MD.   11/25/19 Spoke with patient she has called for an appointment with a [facility] counselor and is waiting for a return call.  She is planning on following up againt tomorrow. Patient has my contact information.  No additional resources are needed at this time. Closing referral.   Delray Reza, AAS Paralegal, [place] . Embedded Care Coordination [facility]  Care Management  [address], [place] [address] [name]  [phone]  [email or web address]

## 2019-11-25 NOTE — Telephone Encounter (Signed)
Patient's blood pressure today 133/76 with HR of 74 this morning, 7:00 am, before taking her medication.She took her medication at 9:00 am. Patient checked her BP while we were on the phone and it was 167/95 with HR of 75.  States that since the 11/17 appointment she has not had any lows.   Patient states she has not had a chance to sit down this morning and rest. She will rest for 10 minutes and then send [place] the results of her new BP on mychart. Patient states she is not having any dizziness, headaches, blurred vision, etc.

## 2019-12-02 DIAGNOSIS — J385 Laryngeal spasm: Secondary | ICD-10-CM | POA: Diagnosis not present

## 2019-12-02 DIAGNOSIS — R0602 Shortness of breath: Secondary | ICD-10-CM | POA: Diagnosis not present

## 2019-12-02 DIAGNOSIS — R490 Dysphonia: Secondary | ICD-10-CM | POA: Diagnosis not present

## 2019-12-05 ENCOUNTER — Telehealth: Payer: Self-pay

## 2019-12-05 ENCOUNTER — Encounter: Payer: Self-pay | Admitting: *Deleted

## 2019-12-05 ENCOUNTER — Other Ambulatory Visit: Payer: Self-pay | Admitting: *Deleted

## 2019-12-05 NOTE — Patient Outreach (Signed)
[facility]) Care Management [facility] CM Telephone Outreach- Transfer to [place] 12/05/2019  Nancy Barajas 26-Sep-1960 [id number]  [place] [name], 59 y/o female referred to [facility] RN CM on April 25, 2019 by [facility] CMA for routine PCP referral for DM management and new diagnosis of CHF. Patient has had no recent unplanned hospitalizations. Patient has history including, but not limited to, HTN/ HLD; DM- type II with A1-C of 8.6 (8.6- [id number]); CKD- III; and anxiety/ depression.  A1-C on 07/10/19: 6.6(Down from 8.6in February 2021), and noted again decreased with most recent A1-C of 6.4 (11/12/19)  HIPAA/ identity verified; patient reports shehas "beendoingpretty good;" and she sounds to be in no distress throughout call today.Reports no clinical concerns and confirms that she has attended all recent provider appointments, continues to drive self. Reports now followed by [facility] for "voice therapy;" states that the providers at [facility] have thus far been unsuccessful in determining the cause of her voice changes.  -- continues monitoring daily blood sugars at home 2- 3 times per day; reviewed with patient- she reports consistent fasting/ post-prandial ranges between 83-141, with one isolated high of 175; denies recent low's below 80; denies signs/ symptoms hypoglycemia -- continues monitoring and recording daily weights with recent ranges between 192-195: this represents a 36 pound weight loss since May 2021- reiterated with patient purpose of daily weights in setting of CHF, and encouraged her to discuss target weight with PCP and cardiology care providers -- continues monitoring and recording blood pressures at home: reports improvement in blood pressures after cardiology office visit when spironolactone was increased: BP today reported as 119/75 -- no medication concerns: continues to wait to hear from [facility] representative; has been  working with care guide at PCP office; reports she would also like information around resources for food, as she occasionally finds herself low on food at her home- will notify care guide of same as an Juluis Rainier -- continues working regularly with [facility] Pharmacist [name] at PCP office- confirms that she received extra help for medications  Patient denies further issues, concerns, or problems today. We again discussed transfer of care to [place], as patient denies ongoing care coordination needs, but will benefit form ongoing support from [place]- she is agreeable today. Iconfirmed that patient hasmy direct phone number, the main [facility] CM office phone number, and the [facility] CM 24-hour nurse advice phone number should issues arise prior to outreach from [place].  Plan:  Patient will take medications as prescribed and will attend all scheduled provider appointments  Patient will promptly notify care providers for any new concerns/ issues/ problems that arise  Patient will continue monitoring/ recording daily blood sugars, blood pressures, and weights at home  I will make patient's PCP aware of [facility] RN CM referral for [place], as well as make her PCP team aware of her request for resources around food acquisition  It has been a pleasure caring for Vernona Rieger, RN, BSN, [employer] Coordinator [facility] Care Management  [phone]

## 2019-12-05 NOTE — Patient Instructions (Signed)
Goals Addressed            This Visit's Progress   . COMPLETED: [name]: Make and Keep All Appointments   On track    Follow Up Date 12/05/2019    - keep a calendar with appointment dates    Why is this important?   Part of staying healthy is seeing the doctor for follow-up care.  If you forget your appointments, there are some things you can do to stay on track.    Notes:   Patient reports continues to drive self to appointments Appointments reviewed with patient who verbalizes plans to attend all as scheduled  11/07/19: Reviewed all recent/ upcoming provider appointments with patient who verbalizes accurate understanding of all with plans to attend all as scheduled; discussed need for flu vaccination for 2021 and encouraged patient to obtain at upcoming scheduled PCP office visit 11/12/19  12/05/19: Goal successfully met; confirmed that patient has accurate understanding of provider appointments and continues driving self to provider appointments; patient very organized and keeps calendar of all scheduled appointments    . COMPLETED: [name]: Monitor and Manage My Blood Sugar   On track    Follow Up Date 12/05/2019    - check blood sugar at prescribed times - check blood sugar if I feel it is too high or too low - enter blood sugar readings and medication or insulin into daily log - take the blood sugar log to all doctor visits    Why is this important?   Checking your blood sugar at home helps to keep it from getting very high or very low.  Writing the results in a diary or log helps the doctor know how to care for you.  Your blood sugar log should have the time, date and the results.  Also, write down the amount of insulin or other medicine that you take.  Other information, like what you ate, exercise done and how you were feeling, will also be helpful.     Notes:   10/07/19: reviewed with patient all recent blood sugar values from both CGM and glucometer  11/08/19:  reviewed recent blood sugars with patient; confirmed patient using CGM consistently; reviewed with patient signs/ symptoms low blood sugar along with corresponding action plan; printed educational material mailed to patient around same  12/05/19:  -- confirmed patient continues monitoring/ recording blood sugars at home 2-3 times per day; reviewed blood sugars with patient- she reports excellent blood sugar control at home with fasting/ post-prandial ranges between 83-140, with one isolated high of 175 -- reviewed most recent A1-C with patient: 6.4-- positive reinforcement provided -- confirmed patient continues following diabetic/ low carb diet and sent care guide message to follow up around patient's request for food resources -- discussed patient's weight loss since May 2021: 36 pounds-- positive reinforcement provided -- Referral placed for [facility] RN [place]- encouraged patient's engagement with [facility] RN [facility] once outreach is established       Diabetes Mellitus and Nutrition, Adult When you have diabetes (diabetes mellitus), it is very important to have healthy eating habits because your blood sugar (glucose) levels are greatly affected by what you eat and drink. Eating healthy foods in the appropriate amounts, at about the same times every day, can help you:  Control your blood glucose.  Lower your risk of heart disease.  Improve your blood pressure.  Reach or maintain a healthy weight. Every person with diabetes is different, and each person has  different needs for a meal plan. Your health care provider may recommend that you work with a diet and nutrition specialist (dietitian) to make a meal plan that is best for you. Your meal plan may vary depending on factors such as:  The calories you need.  The medicines you take.  Your weight.  Your blood glucose, blood pressure, and cholesterol levels.  Your activity level.  Other health conditions you have, such as heart or kidney  disease. How do carbohydrates affect me? Carbohydrates, also called carbs, affect your blood glucose level more than any other type of food. Eating carbs naturally raises the amount of glucose in your blood. Carb counting is a method for keeping track of how many carbs you eat. Counting carbs is important to keep your blood glucose at a healthy level, especially if you use insulin or take certain oral diabetes medicines. It is important to know how many carbs you can safely have in each meal. This is different for every person. Your dietitian can help you calculate how many carbs you should have at each meal and for each snack. Foods that contain carbs include:  Bread, cereal, rice, pasta, and crackers.  Potatoes and corn.  Peas, beans, and lentils.  Milk and yogurt.  Fruit and juice.  Desserts, such as cakes, cookies, ice cream, and candy. How does alcohol affect me? Alcohol can cause a sudden decrease in blood glucose (hypoglycemia), especially if you use insulin or take certain oral diabetes medicines. Hypoglycemia can be a life-threatening condition. Symptoms of hypoglycemia (sleepiness, dizziness, and confusion) are similar to symptoms of having too much alcohol. If your health care provider says that alcohol is safe for you, follow these guidelines:  Limit alcohol intake to no more than 1 drink per day for nonpregnant women and 2 drinks per day for men. One drink equals 12 oz of beer, 5 oz of wine, or 1 oz of hard liquor.  Do not drink on an empty stomach.  Keep yourself hydrated with water, diet soda, or unsweetened iced tea.  Keep in mind that regular soda, juice, and other mixers may contain a lot of sugar and must be counted as carbs. What are tips for following this plan?  Reading food labels  Start by checking the serving size on the "Nutrition Facts" label of packaged foods and drinks. The amount of calories, carbs, fats, and other nutrients listed on the label is based  on one serving of the item. Many items contain more than one serving per package.  Check the total grams (g) of carbs in one serving. You can calculate the number of servings of carbs in one serving by dividing the total carbs by 15. For example, if a food has 30 g of total carbs, it would be equal to 2 servings of carbs.  Check the number of grams (g) of saturated and trans fats in one serving. Choose foods that have low or no amount of these fats.  Check the number of milligrams (mg) of salt (sodium) in one serving. Most people should limit total sodium intake to less than 2,300 mg per day.  Always check the nutrition information of foods labeled as "low-fat" or "nonfat". These foods may be higher in added sugar or refined carbs and should be avoided.  Talk to your dietitian to identify your daily goals for nutrients listed on the label. Shopping  Avoid buying canned, premade, or processed foods. These foods tend to be high in fat, sodium, and  added sugar.  Shop around the outside edge of the grocery store. This includes fresh fruits and vegetables, bulk grains, fresh meats, and fresh dairy. Cooking  Use low-heat cooking methods, such as baking, instead of high-heat cooking methods like deep frying.  Cook using healthy oils, such as olive, canola, or sunflower oil.  Avoid cooking with butter, cream, or high-fat meats. Meal planning  Eat meals and snacks regularly, preferably at the same times every day. Avoid going long periods of time without eating.  Eat foods high in fiber, such as fresh fruits, vegetables, beans, and whole grains. Talk to your dietitian about how many servings of carbs you can eat at each meal.  Eat 4-6 ounces (oz) of lean protein each day, such as lean meat, chicken, fish, eggs, or tofu. One oz of lean protein is equal to: ? 1 oz of meat, chicken, or fish. ? 1 egg. ?  cup of tofu.  Eat some foods each day that contain healthy fats, such as avocado, nuts,  seeds, and fish. Lifestyle  Check your blood glucose regularly.  Exercise regularly as told by your health care provider. This may include: ? 150 minutes of moderate-intensity or vigorous-intensity exercise each week. This could be brisk walking, biking, or water aerobics. ? Stretching and doing strength exercises, such as yoga or weightlifting, at least 2 times a week.  Take medicines as told by your health care provider.  Do not use any products that contain nicotine or tobacco, such as cigarettes and e-cigarettes. If you need help quitting, ask your health care provider.  Work with a Social worker or diabetes educator to identify strategies to manage stress and any emotional and social challenges. Questions to ask a health care provider  Do I need to meet with a diabetes educator?  Do I need to meet with a dietitian?  What number can I call if I have questions?  When are the best times to check my blood glucose? Where to find more information:  American Diabetes Association: diabetes.org  Academy of Nutrition and Dietetics: www.eatright.[employer] of Diabetes and Digestive and Kidney Diseases (NIH): [employer] Summary  A healthy meal plan will help you control your blood glucose and maintain a healthy lifestyle.  Working with a diet and nutrition specialist (dietitian) can help you make a meal plan that is best for you.  Keep in mind that carbohydrates (carbs) and alcohol have immediate effects on your blood glucose levels. It is important to count carbs and to use alcohol carefully. This information is not intended to replace advice given to you by your health care provider. Make sure you discuss any questions you have with your health care provider. Document Revised: 12/02/2016 Document Reviewed: 01/25/2016 Elsevier Patient Education  2020 [employer].

## 2019-12-05 NOTE — Telephone Encounter (Signed)
    MA12/02/2019   Name: Nancy Barajas   MRN: [id number]   DOB: 1960/03/17   AGE: 59 y.o.   GENDER: female   PCP McLean-Scocuzza, Nino Glow, MD.   12/05/19 Spoke with Jordan Likes counselor at [facility]. Brittney stated that she spoke with [name] but she could not decide what she wanted.  Brittney will reach out to [name] again today to assist her. Spoke with the patient and Gari Crown has reached out to her and she is trying to get her in for an appointment this week.     Izaya Netherton, AAS Paralegal, [place] . Embedded Care Coordination [facility]  Care Management  [address], [place] [address] [email or web address]  [phone]   [email or web address]

## 2019-12-09 ENCOUNTER — Ambulatory Visit: Payer: Medicare Other | Admitting: Pharmacist

## 2019-12-09 DIAGNOSIS — E119 Type 2 diabetes mellitus without complications: Secondary | ICD-10-CM

## 2019-12-09 DIAGNOSIS — E1122 Type 2 diabetes mellitus with diabetic chronic kidney disease: Secondary | ICD-10-CM

## 2019-12-09 DIAGNOSIS — I5020 Unspecified systolic (congestive) heart failure: Secondary | ICD-10-CM

## 2019-12-09 DIAGNOSIS — I10 Essential (primary) hypertension: Secondary | ICD-10-CM

## 2019-12-09 DIAGNOSIS — N183 Chronic kidney disease, stage 3 unspecified: Secondary | ICD-10-CM

## 2019-12-09 MED ORDER — ROSUVASTATIN CALCIUM 10 MG PO TABS
10.0000 mg | ORAL_TABLET | Freq: Every evening | ORAL | 3 refills | Status: DC
Start: 2019-12-09 — End: 2020-12-29

## 2019-12-09 NOTE — Chronic Care Management (AMB) (Signed)
Chronic Care Management   Pharmacy Note  12/09/2019 Name: Nancy Barajas MRN: [id number] DOB: 12/29/1960   Subjective:  Nancy Barajas is a 59 y.o. year old female who is a primary care patient of McLean-Scocuzza, Nino Glow, MD. The CCM team was consulted for assistance with chronic disease management and care coordination needs.    Engaged with patient by telephone for follow up visit in response to provider referral for pharmacy case management and/or care coordination services.   Consent to Services:  [name] was given information about Chronic Care Management services, agreed to services, and gave verbal consent prior to initiation of services on 08/27/2018. Please see initial visit note for detailed documentation.   SDOH (Social Determinants of Health) assessments and interventions performed:  SDOH Interventions     Most Recent Value  SDOH Interventions  Financial Strain Interventions Other (Comment)  [Medicare Extra Help + connection w/ resources to review insurance options ([place] [facility])]  Stress Interventions Other (Comment)  [encouraged continued psych collaboration,  encouraged continued relaxation techniques]       Objective:  Lab Results  Component Value Date   CREATININE 1.19 11/12/2019   CREATININE 1.21 (H) 05/14/2019   CREATININE 1.24 (H) 04/02/2019    Lab Results  Component Value Date   HGBA1C 6.4 11/12/2019       Component Value Date/Time   CHOL 186 11/12/2019 0947   TRIG 67.0 11/12/2019 0947   HDL 87.70 11/12/2019 0947   CHOLHDL 2 11/12/2019 0947   VLDL 13.4 11/12/2019 0947   LDLCALC 85 11/12/2019 0947    Clinical ASCVD: No  The ASCVD Risk score (Goff DC Jr., et al., 2013) failed to calculate for the following reasons:   Unable to determine if patient is Non-Hispanic African American     BP Readings from Last 3 Encounters:  11/20/19 (!) 89/53  11/15/19 140/80  11/12/19 134/78    Assessment/Interventions: Review of patient past medical  history, allergies, medications, health status, including review of consultants reports, laboratory and other test data, was performed as part of comprehensive evaluation and provision of chronic care management services.   Allergies  Allergen Reactions  . Penicillins Rash    Did it involve swelling of the face/tongue/throat, SOB, or low BP? No Did it involve sudden or severe rash/hives, skin peeling, or any reaction on the inside of your mouth or nose? No Did you need to seek medical attention at a hospital or doctor's office? No When did it last happen?Childhood If all above answers are "NO", may proceed with cephalosporin use.    Medications Reviewed Today    Reviewed by De Hollingshead, RPH-CPP (Pharmacist) on 12/09/19 at 1032  Med List Status: <None>  Medication Order Taking? Sig Documenting Provider Last Dose Status Informant  blood glucose meter kit and supplies [id number] Yes Dispense based on patient and insurance preference. Use up to four times daily as directed. (FOR ICD-10 E10.9, E11.9). Jodelle Green, FNP Taking Active Self  carvedilol (COREG) 25 MG tablet [id number] Yes Take 1 tablet (25 mg total) by mouth 2 (two) times daily with a meal. McLean-Scocuzza, Nino Glow, MD Taking Active Self  Cholecalciferol (VITAMIN D-3) 125 MCG (5000 UT) TABS [id number] Yes Take 1 tablet by mouth daily.  Patient taking differently: Take 5,000 Units by mouth daily.    Jodelle Green, FNP Taking Active Self  dapagliflozin propanediol (FARXIGA) 5 MG TABS tablet [id number] Yes Take 1 tablet (5 mg total) by mouth daily before breakfast. McLean-Scocuzza, Nino Glow, MD  Taking Active   Dulaglutide (TRULICITY) 3 JO/8.7OM SOPN [id number] Yes Inject 3 mg into the skin once a week. McLean-Scocuzza, Nino Glow, MD Taking Active Self  Multiple Vitamins-Minerals (CENTRUM SILVER 50+WOMEN PO) [id number] Yes Take 1 tablet by mouth daily. [provider] Taking Active Self  mupirocin ointment (BACTROBAN) 2 %  [id number] No Apply 1 application topically 3 (three) times daily.  Patient not taking: Reported on 12/09/2019   McLean-Scocuzza, Nino Glow, MD Not Taking Active Self  oxybutynin (DITROPAN-XL) 10 MG 24 hr tablet [id number] Yes Take 1 tablet (10 mg total) by mouth daily. Billey Co, MD Taking Active   ReliOn Ultra Thin Lancets MISC [id number] Yes 1 Device by Does not apply route 4 (four) times daily -  before meals and at bedtime. relion brand McLean-Scocuzza, Nino Glow, MD Taking Active Self  rosuvastatin (CRESTOR) 10 MG tablet [id number] Yes Take 1 tablet (10 mg total) by mouth every evening. McLean-Scocuzza, Nino Glow, MD Taking Active   sacubitril-valsartan [facility]) 97-103 MG [id number] Yes Take 1 tablet by mouth 2 (two) times daily. Kate Sable, MD Taking Active   sertraline (ZOLOFT) 100 MG tablet [id number] Yes Take 1.5 tablets (150 mg total) by mouth daily. Pucilowski, Marchia Bond, MD Taking Active   sodium bicarbonate 650 MG tablet [id number] Yes Take 1 tablet (650 mg total) by mouth 2 (two) times daily. Billey Co, MD Taking Active   sodium chloride (OCEAN) 0.65 % SOLN nasal spray [id number] Yes Place 1 spray into both nostrils as needed for congestion.  [provider] Taking Active Self  spironolactone (ALDACTONE) 25 MG tablet [id number] Yes Take 1 tablet (25 mg total) by mouth daily. Kate Sable, MD Taking Active           Patient Active Problem List   Diagnosis Date Noted  . Obstructive uropathy 08/01/2019  . Gallstones 07/28/2019  . Umbilical hernia 93/73/4287  . Hematuria 07/15/2019  . Hypertension associated with diabetes ([place]) 07/11/2019  . Vitamin D deficiency 07/11/2019  . Hiatal hernia 07/11/2019  . Obesity (BMI 30.0-34.9) 07/11/2019  . Heart failure with reduced ejection fraction ([place])   . GAD (generalized anxiety disorder) 04/06/2019  . Proteinuria 03/04/2019  . Aortic atherosclerosis ([place]) 03/04/2019  . Thyroid nodule 03/04/2019  . Kidney cysts  03/04/2019  . Hemangioma of liver 03/04/2019  . Sinusitis 03/04/2019  . Kidney stones   . CKD stage 3 secondary to diabetes ([place]) 08/22/2018  . Laryngeal spasm 05/07/2018  . Muscle tension dysphonia 05/07/2018  . Shortness of breath 01/22/2018  . Uncontrolled hypertension 01/22/2018  . Type 2 diabetes mellitus without complication, without long-term current use of insulin ([place]) 01/22/2018  . Mixed hyperlipidemia 01/22/2018  . Mild persistent asthma 01/22/2018    Medication Assistance: None required. Patient affirms current coverage meets needs. Medicare Extra Help  Patient Care Plan: Medication Management    Problem Identified: Diabetes, Heart Failure   Priority: Medium  Onset Date: 10/07/2019    Long-Range Goal: Disease Progression Prevention   Start Date: 10/07/2019  Expected End Date: 12/06/2019  Note:   Current Barriers:  . Unable to independently afford treatment regimen . Complex patient with multiple disease states at increased risk for disease progression  Pharmacist Clinical Goal(s):  Marland Kitchen Over the next 90 days, patient will maintain control of diabetes and hypertension as evidenced by maintenance of lab work through collaboration with PharmD and provider.   Interventions: . Inter-disciplinary care team collaboration (see longitudinal plan of care) . Comprehensive medication review performed; medication list  updated in electronic medical record  Diabetes: . Controlled; current treatment: Trulicity 3 mg weekly, Farxiga 5 mg daily   . Denies any concerns w/ side effects at this time. Discontinued CGM due to cost and lack of necessity at this point.  . Current glucose readings: fastings: 80-110s; 2 hour post prandial: 110-140s . She connected w/ [place] [facility] and has enrolled in a new insurance plan for 2022.  Marland Kitchen Praised for continued maintenance of goal BG. Continue current regimen w/ monitoring as she has been doing.   Hypertension with HFrEF (most recent EF  25-30%) . Uncontrolled per home report, but unsure if using appropriate BP checking technique; current treatment: carvedilol 25 mg BID, spironolactone 25 mg daily, Entresto 97/103 mg BID  . Current home BP readings: 110-150s/60-80s, HR 60-70s, however, reports lower readings on recheck. Often checking in the morning before BP medications, and not sitting still for at least 5 minutes before checking.  . Reviewed appropriate BP checking technique. Wonder if anxiety is contributing to elevations at home in comparison to office based checks.  . Recommended continue current regimen and collaboration w/ cardiology.   Hyperlipidemia: . Controlled; current treatment: rosuvastatin 10 mg daily  . Requests refill today, notes the pharmacy has not refilled. Likely related to last script being under prior PCP.  Marland Kitchen Refill on rosuvastatin sent today.  . Recommended to continue current regimen  Depression/Anxiety: . Improved per patient report; current treatment: sertraline 150 mg daily. Follows w/ psychiatry Dr. Mamie Nick and Garald Braver.  . Does note some continued anxiety. She reports using breathing techniques to help when anxious. Notes that she thinks her gasping may be worsened by anxiety. Recent appt w/ [facility], is being referred to voice therapy. . Encouraged continued treatment at this time, along with psych collaboration. Encouraged continued focus on relaxation techniques.   Patient Goals/Self-Care Activities . Over the next 90 days, patient will:  - take medications as prescribed check blood glucose periodically, 3-5 times weekly, document, and provide at future appointments check blood pressure daily, document, and provide at future appointments  Follow Up Plan: Telephone follow up appointment with care management team member scheduled for: ~ 8 weeks         Plan: Telephone follow up appointment with care management team member scheduled for: ~ 8 weeks  [name], PharmD, [place], [place]  Pharmacist Theodosia Thornton [phone]

## 2019-12-09 NOTE — Patient Instructions (Signed)
Nancy Barajas,   It was so great talking to you today!  I am so happy to see that your blood sugars continue to be well controlled. You can reduce to just checking your glucose daily - alternating between fasting and after meals.   When you check your blood pressure, please make sure that you sit still for at least 5 minutes and relax prior to checking. I've included a handout about the important things to keep in mind when checking your blood pressure to make sure it's an accurate reading.   Keep up the great work, and call me with any questions or concerns!  [name], PharmD [phone]  Visit Information  Patient Care Plan: Medication Management    Problem Identified: Diabetes, Heart Failure   Priority: Medium  Onset Date: 10/07/2019    Long-Range Goal: Disease Progression Prevention   Start Date: 10/07/2019  Expected End Date: 12/06/2019  Note:   Current Barriers:  . Unable to independently afford treatment regimen . Complex patient with multiple disease states at increased risk for disease progression  Pharmacist Clinical Goal(s):  Marland Kitchen Over the next 90 days, patient will maintain control of diabetes and hypertension as evidenced by maintenance of lab work through collaboration with PharmD and provider.   Interventions: . Inter-disciplinary care team collaboration (see longitudinal plan of care) . Comprehensive medication review performed; medication list updated in electronic medical record  Diabetes: . Controlled; current treatment: Trulicity 3 mg weekly, Farxiga 5 mg daily   . Denies any concerns w/ side effects at this time. Discontinued CGM due to cost and lack of necessity at this point.  . Current glucose readings: fastings: 80-110s; 2 hour post prandial: 110-140s . She connected w/ [facility] and has enrolled in a new insurance plan for 2022.  Marland Kitchen Praised for continued maintenance of goal BG. Continue current regimen w/ monitoring as she has been doing.   Hypertension  with HFrEF (most recent EF 25-30%) . Uncontrolled per home report, but unsure if using appropriate BP checking technique; current treatment: carvedilol 25 mg BID, spironolactone 25 mg daily, Entresto 97/103 mg BID  . Current home BP readings: 110-150s/60-80s, HR 60-70s, however, reports lower readings on recheck. Often checking in the morning before BP medications, and not sitting still for at least 5 minutes before checking.  . Reviewed appropriate BP checking technique. Wonder if anxiety is contributing to elevations at home in comparison to office based checks.  . Recommended continue current regimen and collaboration w/ cardiology.   Hyperlipidemia: . Controlled; current treatment: rosuvastatin 10 mg daily  . Requests refill today, notes the pharmacy has not refilled. Likely related to last script being under prior PCP.  Marland Kitchen Refill on rosuvastatin sent today.  . Recommended to continue current regimen  Depression/Anxiety: . Improved per patient report; current treatment: sertraline 150 mg daily. Follows w/ psychiatry Dr. Mamie Nick and Garald Braver.  . Does note some continued anxiety. She reports using breathing techniques to help when anxious. Notes that she thinks her gasping may be worsened by anxiety. Recent appt w/ [facility], is being referred to voice therapy. . Encouraged continued treatment at this time, along with psych collaboration. Encouraged continued focus on relaxation techniques.   Patient Goals/Self-Care Activities . Over the next 90 days, patient will:  - take medications as prescribed check blood glucose periodically, 3-5 times weekly, document, and provide at future appointments check blood pressure daily, document, and provide at future appointments  Follow Up Plan: Telephone follow up appointment with  care management team member scheduled for: ~ 8 weeks      The patient verbalized understanding of instructions, educational materials, and care plan provided today and agreed to  receive a mailed copy of patient instructions, educational materials, and care plan.   Plan: Telephone follow up appointment with care management team member scheduled for: ~ 8 weeks  [name], PharmD, [place], [place] Pharmacist [place] [phone]

## 2019-12-11 ENCOUNTER — Encounter: Payer: Self-pay | Admitting: *Deleted

## 2019-12-11 ENCOUNTER — Other Ambulatory Visit: Payer: Self-pay | Admitting: *Deleted

## 2019-12-11 NOTE — Patient Outreach (Signed)
[facility]) Care Management  [place]  12/11/2019   [place] Nancy Barajas Aug 08, 1960 [id number]   [place] Initial Assessment  Referral Date:  12/05/2019 Referral Source:  Transfer from [place] Reason for Referral:  Continued Disease Management Education Insurance:  Medicare and Medicaid   Outreach Attempt:  Successful telephone outreach to patient for introduction and initial telephone assessment.  HIPAA verified with patient.  RN Health Coach introduced self and role.  Discussed I will be changing roles in the future and patient to be contacted by another RN Health Coach.  Patient verbally agrees to Disease Management outreaches and competed initial telephone assessment.  Social:  Patient lives at home with daughter.  Reports being independent with ADLs and IADLs.  Ambulates independently and denies any falls within the last year.  Does report being homeless 2 years ago, but states she is doing fair now with the assistance of her daughter helping when food runs low.  Having difficulties paying current medical bills from procedures done this year. Encouraged to contact Medicaid Social Work for possible assistance or to discuss payment options for providers with bills.  Drives self to medical appointments.  DME in the home include:  CBG meter, blood pressure cuff, and scale.  Conditions:  Per chart review and discussion with patient, PMH include but not limited to:  Anxiety, depression, asthma, diabetes, kidney stones, vertigo, nonischemic cardiomyopathy, hypertension, and vocal cord dysfunction.  Denies any recent emergency room visits or hospitalizations.  Monitors weights daily.  Weight this morning was 195 pounds (within normal range).  Monitors blood pressures daily.  Blood pressure this morning was 146/85.  Latest Hgb A1C reduced to 6.4. Patient checks blood sugars about three times a day.  Fasting blood sugar this morning was 110 with fasting  ranges of 90-110's.  Does report decrease in appetite and weight loss over the last several months.  Admits to being a little depressed but states it is getting better and speaks with therapist about every 3 months.   Medications:  Reports taking about 10 medications daily.  Patient manages medications with weekly pill box fills.  Has been working with Embedded Pharmacist to assist with affording medications.  Encounter Medications:  Outpatient Encounter Medications as of 12/11/2019  Medication Sig Note  . carvedilol (COREG) 25 MG tablet Take 1 tablet (25 mg total) by mouth 2 (two) times daily with a meal.   . Cholecalciferol (VITAMIN D-3) 125 MCG (5000 UT) TABS Take 1 tablet by mouth daily. (Patient taking differently: Take 5,000 Units by mouth daily. )   . dapagliflozin propanediol (FARXIGA) 5 MG TABS tablet Take 1 tablet (5 mg total) by mouth daily before breakfast.   . Dulaglutide (TRULICITY) 3 WU/9.8JX SOPN Inject 3 mg into the skin once a week.   . Multiple Vitamins-Minerals (CENTRUM SILVER 50+WOMEN PO) Take 1 tablet by mouth daily.   . rosuvastatin (CRESTOR) 10 MG tablet Take 1 tablet (10 mg total) by mouth every evening.   . sacubitril-valsartan (ENTRESTO) 97-103 MG Take 1 tablet by mouth 2 (two) times daily.   . sertraline (ZOLOFT) 100 MG tablet Take 1.5 tablets (150 mg total) by mouth daily.   . sodium bicarbonate 650 MG tablet Take 1 tablet (650 mg total) by mouth 2 (two) times daily.   . sodium chloride (OCEAN) 0.65 % SOLN nasal spray Place 1 spray into both nostrils as needed for congestion.    Marland Kitchen spironolactone (ALDACTONE) 25 MG tablet Take 1 tablet (  25 mg total) by mouth daily.   . blood glucose meter kit and supplies Dispense based on patient and insurance preference. Use up to four times daily as directed. (FOR ICD-10 E10.9, E11.9).   . mupirocin ointment (BACTROBAN) 2 % Apply 1 application topically 3 (three) times daily. (Patient not taking: Reported on 12/09/2019)   . oxybutynin  (DITROPAN-XL) 10 MG 24 hr tablet Take 1 tablet (10 mg total) by mouth daily. (Patient not taking: Reported on 12/11/2019) 12/11/2019: Reports not taking  . ReliOn Ultra Thin Lancets MISC 1 Device by Does not apply route 4 (four) times daily -  before meals and at bedtime. relion brand    No facility-administered encounter medications on file as of 12/11/2019.    Functional Status:  In your present state of health, do you have any difficulty performing the following activities: 08/22/2019 05/20/2019  Hearing? N N  Vision? N -  Difficulty concentrating or making decisions? N -  Walking or climbing stairs? N -  Comment sometimes gets dizzy -  Dressing or bathing? N -  Doing errands, shopping? N -  Preparing Food and eating ? - -  Using the Toilet? - -  In the past six months, have you accidently leaked urine? - N  Comment - "Very rarely" per patient report- reports does not use depends, etc  Do you have problems with loss of bowel control? - N  Managing your Medications? - -  Managing your Finances? - -  Comment - -  Housekeeping or managing your Housekeeping? - -  Comment - -  Some recent data might be hidden    Fall/Depression Screening: Fall Risk  12/11/2019 11/12/2019 07/10/2019  Falls in the past year? 0 0 0  Number falls in past yr: 0 0 0  Injury with Fall? 0 0 0  Comment - - -  Risk for fall due to : Medication side effect - -  Risk for fall due to: Comment - - -  Follow up Education provided;Falls prevention discussed;Falls evaluation completed Falls evaluation completed Falls evaluation completed   PHQ 2/9 Scores 12/11/2019 12/05/2019 05/01/2019 04/04/2019 03/27/2019 03/01/2019 01/15/2019  PHQ - 2 Score '1 1 2 4 4 ' 0 -  PHQ- 9 Score - - '5 13 13 ' - -  Exception Documentation - - - - - - (No Data)    Goals Addressed            This Visit's Progress   . ([facility]) Find Help in My Community       Timeframe:  Short-Term Goal Priority:  High Start Date:  12/11/2019                            Expected End Date:  04/02/2020                        - follow-up on any referrals for help I am given - think ahead to make sure my need does not become an emergency - make a note about what I need to have by the phone or take with me, like an identification card or social security number have a back-up plan -contact Social Services to speak with your Medicaid Worker to discuss re enrollment and assistance with medical bills  -contact provider offices to arrange payment arrangements for medical bills/copays    Why is this important?    Knowing how and where to find help for  yourself or family in your neighborhood and community is an important skill.   You will want to take some steps to learn how.    Notes:     . [facility]) Make and Keep All Appointments   On track    Timeframe:  Long-Range Goal Priority:  Medium Start Date:  10/07/2019                       Expected End Date:  07/02/2020                       - call to cancel if needed - keep a calendar with appointment dates -schedule dental appointment after new year once insurance is available -schedule eye exam in January 2022    Why is this important?   Part of staying healthy is seeing the doctor for follow-up care.  If you forget your appointments, there are some things you can do to stay on track.    Notes:   Patient reports continues to drive self to appointments Appointments reviewed with patient who verbalizes plans to attend all as scheduled  11/07/19: Reviewed all recent/ upcoming provider appointments with patient who verbalizes accurate understanding of all with plans to attend all as scheduled; discussed need for flu vaccination for 2021 and encouraged patient to obtain at upcoming scheduled PCP office visit 11/12/19  12/05/19: Goal successfully met; confirmed that patient has accurate understanding of provider appointments and continues driving self to provider appointments; patient very organized and keeps  calendar of all scheduled appointments    . ([place]) Monitor and Manage My Blood Sugar   On track    Timeframe:  Long-Range Goal Priority:  Medium Start Date:  10/07/2019                           Expected End Date:  07/02/20                       - check blood sugar at prescribed times - check blood sugar if I feel it is too high or too low - enter blood sugar readings and medication or insulin into daily log - take the blood sugar log to all doctor visits  -continue to monitor weights daily -continue to monitor blood pressures daily   Why is this important?   Checking your blood sugar at home helps to keep it from getting very high or very low.  Writing the results in a diary or log helps the doctor know how to care for you.  Your blood sugar log should have the time, date and the results.  Also, write down the amount of insulin or other medicine that you take.  Other information, like what you ate, exercise done and how you were feeling, will also be helpful.     Notes:   10/07/19: reviewed with patient all recent blood sugar values from both CGM and glucometer  11/08/19: reviewed recent blood sugars with patient; confirmed patient using CGM consistently; reviewed with patient signs/ symptoms low blood sugar along with corresponding action plan; printed educational material mailed to patient around same  12/05/19:  -- confirmed patient continues monitoring/ recording blood sugars at home 2-3 times per day; reviewed blood sugars with patient- she reports excellent blood sugar control at home with fasting/ post-prandial ranges between 83-140, with one isolated high of 175 -- reviewed most recent A1-C with  patient: 6.4-- positive reinforcement provided -- confirmed patient continues following diabetic/ low carb diet and sent care guide message to follow up around patient's request for food resources -- discussed patient's weight loss since May 2021: 36 pounds-- positive reinforcement  provided -- Referral placed for [facility] RN Leon- encouraged patient's engagement with [facility] RN [facility] once outreach is established  12/11/19--Encouraged to continue to monitor blood sugars; reviewed signs and symptoms of hypoglycemia and encouraged patient to monitor for signs and symptoms    . [facility]) Obtain Eye Exam-Diabetes Type 2       Timeframe:  Short-Term Goal Priority:  Medium Start Date:  12/11/2019                           Expected End Date:  04/02/2020                       - schedule appointment with eye doctor by January 2022    Why is this important?    Eye check-ups are important when you have diabetes.   Vision loss can be prevented.    Notes:     . [facility]) Set My Target A1C-Diabetes Type 2       Timeframe:  Long-Range Goal Priority:  Medium Start Date:   12/11/2019                          Expected End Date:  07/02/2020                       - set target A1C; current A1C 6.4 (11/12/2019) -discuss with PCP goal Hgb A1C    Why is this important?    Your target A1C is decided together by you and your doctor.   It is based on several things like your age and other health issues.    Notes:       Appointments:  Attended appointment with primary care provider on 11/12/2019 and has follow up scheduled for 02/19/2020.  Has scheduled appointment with therapist 12/13/2019.  Advanced Directives:  Denies having Advance Directive in place.  Would like information to create one.   Consent:  [facility] services reviewed and discussed.  Patient verbally agrees to Disease Management outreaches.  Plan: Follow-up:  Patient agrees to Care Plan and Follow-up. RN Health Coach will send primary MD barriers letter. RN Health Coach will route initial telephone assessment note to primary MD. [place] will send patient [place]. RN Health Coach will send patient Emergency planning/management officer. RN Health Coach will make next telephone outreach to patient in the month of March and  patient agrees to future outreach.  [place] [phone] [email or web address]

## 2019-12-11 NOTE — Patient Outreach (Signed)
[facility]) Care Management  12/11/2019  Nancy Barajas 10/30/1960 [id number]   RN Health Coach Initial Assessment  Referral Date:  12/05/2019 Referral Source:  Transfer from [place] Reason for Referral:  Continued Disease Management Education Insurance:  Medicare and Medicaid   Outreach Attempt:  Outreach attempt #1 to patient for introduction and initial telephone assessment. No answer. RN Health Coach left voicemail message along with contact information.  Plan:  RN Health Coach will make another outreach attempt within the month of January if no return call back from patient.   Nancy Barajas [phone] [email or web address]

## 2019-12-11 NOTE — Patient Instructions (Signed)
Goals Addressed            This Visit's Progress   . [facility]) Find Help in My Community       Timeframe:  Short-Term Goal Priority:  High Start Date:  12/11/2019                           Expected End Date:  04/02/2020                        - follow-up on any referrals for help I am given - think ahead to make sure my need does not become an emergency - make a note about what I need to have by the phone or take with me, like an identification card or social security number have a back-up plan -contact Social Services to speak with your Medicaid Worker to discuss re enrollment and assistance with medical bills  -contact provider offices to arrange payment arrangements for medical bills/copays    Why is this important?    Knowing how and where to find help for yourself or family in your neighborhood and community is an important skill.   You will want to take some steps to learn how.    Notes:     . [facility]) Make and Keep All Appointments   On track    Timeframe:  Long-Range Goal Priority:  Medium Start Date:  10/07/2019                       Expected End Date:  07/02/2020                       - call to cancel if needed - keep a calendar with appointment dates -schedule dental appointment after new year once insurance is available -schedule eye exam in January 2022    Why is this important?   Part of staying healthy is seeing the doctor for follow-up care.  If you forget your appointments, there are some things you can do to stay on track.    Notes:   Patient reports continues to drive self to appointments Appointments reviewed with patient who verbalizes plans to attend all as scheduled  11/07/19: Reviewed all recent/ upcoming provider appointments with patient who verbalizes accurate understanding of all with plans to attend all as scheduled; discussed need for flu vaccination for 2021 and encouraged patient to obtain at upcoming scheduled PCP office visit  11/12/19  12/05/19: Goal successfully met; confirmed that patient has accurate understanding of provider appointments and continues driving self to provider appointments; patient very organized and keeps calendar of all scheduled appointments    . ([facility] [place]) Monitor and Manage My Blood Sugar   On track    Timeframe:  Long-Range Goal Priority:  Medium Start Date:  10/07/2019                           Expected End Date:  07/02/20                       - check blood sugar at prescribed times - check blood sugar if I feel it is too high or too low - enter blood sugar readings and medication or insulin into daily log - take the blood sugar log to all doctor visits  -continue to monitor weights daily -continue to  monitor blood pressures daily   Why is this important?   Checking your blood sugar at home helps to keep it from getting very high or very low.  Writing the results in a diary or log helps the doctor know how to care for you.  Your blood sugar log should have the time, date and the results.  Also, write down the amount of insulin or other medicine that you take.  Other information, like what you ate, exercise done and how you were feeling, will also be helpful.     Notes:   10/07/19: reviewed with patient all recent blood sugar values from both CGM and glucometer  11/08/19: reviewed recent blood sugars with patient; confirmed patient using CGM consistently; reviewed with patient signs/ symptoms low blood sugar along with corresponding action plan; printed educational material mailed to patient around same  12/05/19:  -- confirmed patient continues monitoring/ recording blood sugars at home 2-3 times per day; reviewed blood sugars with patient- she reports excellent blood sugar control at home with fasting/ post-prandial ranges between 83-140, with one isolated high of 175 -- reviewed most recent A1-C with patient: 6.4-- positive reinforcement provided -- confirmed patient continues  following diabetic/ low carb diet and sent care guide message to follow up around patient's request for food resources -- discussed patient's weight loss since May 2021: 36 pounds-- positive reinforcement provided -- Referral placed for [facility] RN [place]- encouraged patient's engagement with [facility] RN [facility] once outreach is established  12/11/19--Encouraged to continue to monitor blood sugars; reviewed signs and symptoms of hypoglycemia and encouraged patient to monitor for signs and symptoms    . [facility]) Obtain Eye Exam-Diabetes Type 2       Timeframe:  Short-Term Goal Priority:  Medium Start Date:  12/11/2019                           Expected End Date:  04/02/2020                       - schedule appointment with eye doctor by January 2022    Why is this important?    Eye check-ups are important when you have diabetes.   Vision loss can be prevented.    Notes:     . [facility]) Set My Target A1C-Diabetes Type 2       Timeframe:  Long-Range Goal Priority:  Medium Start Date:   12/11/2019                          Expected End Date:  07/02/2020                       - set target A1C; current A1C 6.4 (11/12/2019) -discuss with PCP goal Hgb A1C    Why is this important?    Your target A1C is decided together by you and your doctor.   It is based on several things like your age and other health issues.    Notes:

## 2019-12-13 ENCOUNTER — Ambulatory Visit (INDEPENDENT_AMBULATORY_CARE_PROVIDER_SITE_OTHER): Payer: Medicare Other | Admitting: Psychology

## 2019-12-13 DIAGNOSIS — F418 Other specified anxiety disorders: Secondary | ICD-10-CM | POA: Diagnosis not present

## 2019-12-18 ENCOUNTER — Ambulatory Visit (INDEPENDENT_AMBULATORY_CARE_PROVIDER_SITE_OTHER): Payer: Medicare Other | Admitting: Psychology

## 2019-12-18 DIAGNOSIS — F418 Other specified anxiety disorders: Secondary | ICD-10-CM

## 2019-12-29 ENCOUNTER — Other Ambulatory Visit: Payer: Self-pay | Admitting: Urology

## 2020-01-01 ENCOUNTER — Other Ambulatory Visit: Payer: Self-pay

## 2020-01-01 ENCOUNTER — Encounter: Payer: Self-pay | Admitting: Urology

## 2020-01-01 ENCOUNTER — Ambulatory Visit (INDEPENDENT_AMBULATORY_CARE_PROVIDER_SITE_OTHER): Payer: Medicare Other | Admitting: Urology

## 2020-01-01 VITALS — BP 161/86 | HR 72 | Ht 71.0 in | Wt 200.0 lb

## 2020-01-01 DIAGNOSIS — N200 Calculus of kidney: Secondary | ICD-10-CM | POA: Diagnosis not present

## 2020-01-01 DIAGNOSIS — N3281 Overactive bladder: Secondary | ICD-10-CM | POA: Diagnosis not present

## 2020-01-01 DIAGNOSIS — I428 Other cardiomyopathies: Secondary | ICD-10-CM

## 2020-01-01 LAB — MICROSCOPIC EXAMINATION: Epithelial Cells (non renal): >10 /hpf — AB (ref 0–10)

## 2020-01-01 LAB — URINALYSIS, COMPLETE
Bilirubin, UA: NEGATIVE
Ketones, UA: NEGATIVE
Leukocytes,UA: NEGATIVE
Nitrite, UA: NEGATIVE
Protein,UA: NEGATIVE
Specific Gravity, UA: 1.025 (ref 1.005–1.030)
Urobilinogen, Ur: 0.2 mg/dL (ref 0.2–1.0)
pH, UA: 5 (ref 5.0–7.5)

## 2020-01-01 MED ORDER — OXYBUTYNIN CHLORIDE ER 10 MG PO TB24: 10.0000 mg | ORAL_TABLET | Freq: Every day | ORAL | 11 refills | Status: DC

## 2020-01-01 MED ORDER — SODIUM BICARBONATE 650 MG PO TABS: 650.0000 mg | ORAL_TABLET | Freq: Three times a day (TID) | ORAL | 5 refills | Status: DC

## 2020-01-01 MED ORDER — ENTRESTO 97-103 MG PO TABS: 1.0000 | ORAL_TABLET | Freq: Two times a day (BID) | ORAL | 3 refills | Status: DC

## 2020-01-01 NOTE — Progress Notes (Signed)
   01/01/2020 9:01 AM   [name] Jul 10, 1960 [id number]  Reason for visit: Follow up nephrolithiasis, OAB  HPI: She is a 59 year old female with over 20 prior stone episodes who recently underwent left ureteroscopy and laser lithotripsy in September 2021 for a 2 cm left renal stone, and stone analysis showed 90% uric acid.  I personally viewed and interpreted the renal ultrasound that showed no hydronephrosis or significant residual stone disease.  We started her on alkalinization therapy with sodium bicarbonate twice daily. Urine pH prior to alkalinization therapy was 5.0.  Urine pH today is still 5.0, but she did not take her sodium bicarbonate yet this morning.  Regarding her overactive bladder, her primary complaints were urgency, frequency, and urge incontinence.  She drinks only water during the day.  At our last visit in November 2021 I recommended a trial of oxybutynin 10 mg XL daily.  For unclear reasons, she never started this medication.  We reviewed the risks and benefits, as well as side effects of oxybutynin again today at length, and this was re-sent to her pharmacy.  -Trial of oxybutynin 10 mg XL daily for OAB symptoms -Sodium bicarbonate increased to 3 times daily -RTC for symptom check and repeat UA for pH evaluation   Sondra Come, MD  [facility] [address] [place], [place] [address] [phone]

## 2020-01-01 NOTE — Patient Instructions (Signed)
Dietary Guidelines to Help Prevent Kidney Stones Kidney stones are deposits of minerals and salts that form inside your kidneys. Your risk of developing kidney stones may be greater depending on your diet, your lifestyle, the medicines you take, and whether you have certain medical conditions. Most people can reduce their chances of developing kidney stones by following the instructions below. Depending on your overall health and the type of kidney stones you tend to develop, your dietitian may give you more specific instructions. What are tips for following this plan? Reading food labels  Choose foods with "no salt added" or "low-salt" labels. Limit your sodium intake to less than 1500 mg per day.  Choose foods with calcium for each meal and snack. Try to eat about 300 mg of calcium at each meal. Foods that contain 200-500 mg of calcium per serving include: ? 8 oz (237 ml) of milk, fortified nondairy milk, and fortified fruit juice. ? 8 oz (237 ml) of kefir, yogurt, and soy yogurt. ? 4 oz (118 ml) of tofu. ? 1 oz of cheese. ? 1 cup (300 g) of dried figs. ? 1 cup (91 g) of cooked broccoli. ? 1-3 oz can of sardines or mackerel.  Most people need 1000 to 1500 mg of calcium each day. Talk to your dietitian about how much calcium is recommended for you. Shopping  Buy plenty of fresh fruits and vegetables. Most people do not need to avoid fruits and vegetables, even if they contain nutrients that may contribute to kidney stones.  When shopping for convenience foods, choose: ? Whole pieces of fruit. ? Premade salads with dressing on the side. ? Low-fat fruit and yogurt smoothies.  Avoid buying frozen meals or prepared deli foods.  Look for foods with live cultures, such as yogurt and kefir. Cooking  Do not add salt to food when cooking. Place a salt shaker on the table and allow each person to add his or her own salt to taste.  Use vegetable protein, such as beans, textured vegetable  protein (TVP), or tofu instead of meat in pasta, casseroles, and soups. Meal planning   Eat less salt, if told by your dietitian. To do this: ? Avoid eating processed or premade food. ? Avoid eating fast food.  Eat less animal protein, including cheese, meat, poultry, or fish, if told by your dietitian. To do this: ? Limit the number of times you have meat, poultry, fish, or cheese each week. Eat a diet free of meat at least 2 days a week. ? Eat only one serving each day of meat, poultry, fish, or seafood. ? When you prepare animal protein, cut pieces into small portion sizes. For most meat and fish, one serving is about the size of one deck of cards.  Eat at least 5 servings of fresh fruits and vegetables each day. To do this: ? Keep fruits and vegetables on hand for snacks. ? Eat 1 piece of fruit or a handful of berries with breakfast. ? Have a salad and fruit at lunch. ? Have two kinds of vegetables at dinner.  Limit foods that are high in a substance called oxalate. These include: ? Spinach. ? Rhubarb. ? Beets. ? Potato chips and french fries. ? Nuts.  If you regularly take a diuretic medicine, make sure to eat at least 1-2 fruits or vegetables high in potassium each day. These include: ? Avocado. ? Banana. ? Orange, prune, carrot, or tomato juice. ? Baked potato. ? Cabbage. ? Beans and split   peas. General instructions   Drink enough fluid to keep your urine clear or pale yellow. This is the most important thing you can do.  Talk to your health care provider and dietitian about taking daily supplements. Depending on your health and the cause of your kidney stones, you may be advised: ? Not to take supplements with vitamin C. ? To take a calcium supplement. ? To take a daily probiotic supplement. ? To take other supplements such as magnesium, fish oil, or vitamin B6.  Take all medicines and supplements as told by your health care provider.  Limit alcohol intake to no  more than 1 drink a day for nonpregnant women and 2 drinks a day for men. One drink equals 12 oz of beer, 5 oz of wine, or 1 oz of hard liquor.  Lose weight if told by your health care provider. Work with your dietitian to find strategies and an eating plan that works best for you. What foods are not recommended? Limit your intake of the following foods, or as told by your dietitian. Talk to your dietitian about specific foods you should avoid based on the type of kidney stones and your overall health. Grains Breads. Bagels. Rolls. Baked goods. Salted crackers. Cereal. Pasta. Vegetables Spinach. Rhubarb. Beets. Canned vegetables. Pickles. Olives. Meats and other protein foods Nuts. Nut butters. Large portions of meat, poultry, or fish. Salted or cured meats. Deli meats. Hot dogs. Sausages. Dairy Cheese. Beverages Regular soft drinks. Regular vegetable juice. Seasonings and other foods Seasoning blends with salt. Salad dressings. Canned soups. Soy sauce. Ketchup. Barbecue sauce. Canned pasta sauce. Casseroles. Pizza. Lasagna. Frozen meals. Potato chips. French fries. Summary  You can reduce your risk of kidney stones by making changes to your diet.  The most important thing you can do is drink enough fluid. You should drink enough fluid to keep your urine clear or pale yellow.  Ask your health care provider or dietitian how much protein from animal sources you should eat each day, and also how much salt and calcium you should have each day. This information is not intended to replace advice given to you by your health care provider. Make sure you discuss any questions you have with your health care provider. Document Revised: 04/11/2018 Document Reviewed: 12/01/2015 [employer] Patient Education  2020 [employer] Overactive Bladder, Adult  Overactive bladder refers to a condition in which a person has a sudden need to pass urine. The person may leak urine if he or she cannot get to the  bathroom fast enough (urinary incontinence). A person with this condition may also wake up several times in the night to go to the bathroom. Overactive bladder is associated with poor nerve signals between your bladder and your brain. Your bladder may get the signal to empty before it is full. You may also have very sensitive muscles that make your bladder squeeze too soon. These symptoms might interfere with daily work or social activities. What are the causes? This condition may be associated with or caused by:  Urinary tract infection.  Infection of nearby tissues, such as the prostate.  Prostate enlargement.  Surgery on the uterus or urethra.  Bladder stones, inflammation, or tumors.  Drinking too much caffeine or alcohol.  Certain medicines, especially medicines that get rid of extra fluid in the body (diuretics).  Muscle or nerve weakness, especially from: ? A spinal cord injury. ? Stroke. ? Multiple sclerosis. ? Parkinson's disease.  Diabetes.  Constipation. What increases the risk?   You may be at greater risk for overactive bladder if you:  Are an older adult.  Smoke.  Are going through menopause.  Have prostate problems.  Have a neurological disease, such as stroke, dementia, Parkinson's disease, or multiple sclerosis (MS).  Eat or drink things that irritate the bladder. These include alcohol, spicy food, and caffeine.  Are overweight or obese. What are the signs or symptoms? Symptoms of this condition include:  Sudden, strong urge to urinate.  Leaking urine.  Urinating 8 or more times a day.  Waking up to urinate 2 or more times a night. How is this diagnosed? Your health care provider may suspect overactive bladder based on your symptoms. He or she will diagnose this condition by:  A physical exam and medical history.  Blood or urine tests. You might need bladder or urine tests to help determine what is causing your overactive bladder. You might  also need to see a health care provider who specializes in urinary tract problems (urologist). How is this treated? Treatment for overactive bladder depends on the cause of your condition and whether it is mild or severe. You can also make lifestyle changes at home. Options include:  Bladder training. This may include: ? Learning to control the urge to urinate by following a schedule that directs you to urinate at regular intervals (timed voiding). ? Doing Kegel exercises to strengthen your pelvic floor muscles, which support your bladder. Toning these muscles can help you control urination, even if your bladder muscles are overactive.  Special devices. This may include: ? Biofeedback, which uses sensors to help you become aware of your body's signals. ? Electrical stimulation, which uses electrodes placed inside the body (implanted) or outside the body. These electrodes send gentle pulses of electricity to strengthen the nerves or muscles that control the bladder. ? Women may use a plastic device that fits into the vagina and supports the bladder (pessary).  Medicines. ? Antibiotics to treat bladder infection. ? Antispasmodics to stop the bladder from releasing urine at the wrong time. ? Tricyclic antidepressants to relax bladder muscles. ? Injections of botulinum toxin type A directly into the bladder tissue to relax bladder muscles.  Lifestyle changes. This may include: ? Weight loss. Talk to your health care provider about weight loss methods that would work best for you. ? Diet changes. This may include reducing how much alcohol and caffeine you consume, or drinking fluids at different times of the day. ? Not smoking. Do not use any products that contain nicotine or tobacco, such as cigarettes and e-cigarettes. If you need help quitting, ask your health care provider.  Surgery. ? A device may be implanted to help manage the nerve signals that control urination. ? An electrode may be  implanted to stimulate electrical signals in the bladder. ? A procedure may be done to change the shape of the bladder. This is done only in very severe cases. Follow these instructions at home: Lifestyle  Make any diet or lifestyle changes that are recommended by your health care provider. These may include: ? Drinking less fluid or drinking fluids at different times of the day. ? Cutting down on caffeine or alcohol. ? Doing Kegel exercises. ? Losing weight if needed. ? Eating a healthy and balanced diet to prevent constipation. This may include:  Eating foods that are high in fiber, such as fresh fruits and vegetables, whole grains, and beans.  Limiting foods that are high in fat and processed sugars, such as fried   and sweet foods. General instructions  Take over-the-counter and prescription medicines only as told by your health care provider.  If you were prescribed an antibiotic medicine, take it as told by your health care provider. Do not stop taking the antibiotic even if you start to feel better.  Use any implants or pessary as told by your health care provider.  If needed, wear pads to absorb urine leakage.  Keep a journal or log to track how much and when you drink and when you feel the need to urinate. This will help your health care provider monitor your condition.  Keep all follow-up visits as told by your health care provider. This is important. Contact a health care provider if:  You have a fever.  Your symptoms do not get better with treatment.  Your pain and discomfort get worse.  You have more frequent urges to urinate. Get help right away if:  You are not able to control your bladder. Summary  Overactive bladder refers to a condition in which a person has a sudden need to pass urine.  Several conditions may lead to an overactive bladder.  Treatment for overactive bladder depends on the cause and severity of your condition.  Follow your health care  provider's instructions about lifestyle changes, doing Kegel exercises, keeping a journal, and taking medicines. This information is not intended to replace advice given to you by your health care provider. Make sure you discuss any questions you have with your health care provider. Document Revised: 04/12/2018 Document Reviewed: 01/05/2017 [employer] Patient Education  2020 [employer]  

## 2020-01-07 ENCOUNTER — Telehealth: Payer: Self-pay | Admitting: Internal Medicine

## 2020-01-07 DIAGNOSIS — E119 Type 2 diabetes mellitus without complications: Secondary | ICD-10-CM

## 2020-01-07 MED ORDER — TRULICITY 3 MG/0.5ML ~~LOC~~ SOAJ: 3.0000 mg | SUBCUTANEOUS | 6 refills | Status: DC

## 2020-01-07 NOTE — Telephone Encounter (Signed)
Called pharmacy and they state the script needed to be changed from 5 pens to 4 pens as that is all that comes in the box.   Medication sent in to preferred pharmacy per protocol.   Patient informed and verbalized understanding.

## 2020-01-07 NOTE — Telephone Encounter (Signed)
Patient tried to get her Dulaglutide (TRULICITY) 3 MG/0.5ML SOPN filled and was told by her pharmacy she can not get that refilled until March. Please advise, thank you.

## 2020-01-13 ENCOUNTER — Ambulatory Visit (INDEPENDENT_AMBULATORY_CARE_PROVIDER_SITE_OTHER): Payer: Medicare HMO | Admitting: Psychology

## 2020-01-13 DIAGNOSIS — R69 Illness, unspecified: Secondary | ICD-10-CM | POA: Diagnosis not present

## 2020-01-13 DIAGNOSIS — F418 Other specified anxiety disorders: Secondary | ICD-10-CM | POA: Diagnosis not present

## 2020-01-13 DIAGNOSIS — E119 Type 2 diabetes mellitus without complications: Secondary | ICD-10-CM | POA: Diagnosis not present

## 2020-01-13 LAB — HM DIABETES EYE EXAM

## 2020-01-15 ENCOUNTER — Encounter: Payer: Self-pay | Admitting: Internal Medicine

## 2020-01-16 ENCOUNTER — Ambulatory Visit (INDEPENDENT_AMBULATORY_CARE_PROVIDER_SITE_OTHER): Payer: Medicare HMO

## 2020-01-16 ENCOUNTER — Other Ambulatory Visit: Payer: Self-pay

## 2020-01-16 ENCOUNTER — Telehealth (INDEPENDENT_AMBULATORY_CARE_PROVIDER_SITE_OTHER): Payer: Medicare HMO | Admitting: Psychiatry

## 2020-01-16 ENCOUNTER — Telehealth: Payer: Self-pay

## 2020-01-16 VITALS — Ht 71.0 in | Wt 200.0 lb

## 2020-01-16 DIAGNOSIS — Z0000 Encounter for general adult medical examination without abnormal findings: Secondary | ICD-10-CM

## 2020-01-16 DIAGNOSIS — E119 Type 2 diabetes mellitus without complications: Secondary | ICD-10-CM

## 2020-01-16 DIAGNOSIS — F411 Generalized anxiety disorder: Secondary | ICD-10-CM

## 2020-01-16 DIAGNOSIS — R69 Illness, unspecified: Secondary | ICD-10-CM | POA: Diagnosis not present

## 2020-01-16 MED ORDER — SERTRALINE HCL 100 MG PO TABS
150.0000 mg | ORAL_TABLET | Freq: Every day | ORAL | 1 refills | Status: DC
Start: 2020-01-16 — End: 2020-07-02

## 2020-01-16 MED ORDER — TRULICITY 3 MG/0.5ML ~~LOC~~ SOAJ: 3.0000 mg | SUBCUTANEOUS | 3 refills | Status: DC

## 2020-01-16 NOTE — Progress Notes (Signed)
Subjective:   Nancy Barajas is a 60 y.o. female who presents for Medicare Annual (Subsequent) preventive examination.  Review of Systems    No ROS.  Medicare Wellness Virtual Visit.   Cardiac Risk Factors include: advanced age (>80mn, >>17women);hypertension;diabetes mellitus     Objective:    Today's Vitals   01/16/20 1333  Weight: 200 lb (90.7 kg)  Height: '5\' 11"'  (1.803 m)   Body mass index is 27.89 kg/m.  Advanced Directives 12/11/2019 09/27/2019 08/22/2019 05/01/2019 03/27/2019 01/15/2019  Does Patient Have a Medical Advance Directive? No No No No No No  Would patient like information on creating a medical advance directive? Yes (MAU/Ambulatory/Procedural Areas - Information given) No - Patient declined - No - Patient declined No - Patient declined Yes (MAU/Ambulatory/Procedural Areas - Information given)    Current Medications (verified) Outpatient Encounter Medications as of 01/16/2020  Medication Sig  . blood glucose meter kit and supplies Dispense based on patient and insurance preference. Use up to four times daily as directed. (FOR ICD-10 E10.9, E11.9).  . carvedilol (COREG) 25 MG tablet Take 1 tablet (25 mg total) by mouth 2 (two) times daily with a meal.  . Cholecalciferol (VITAMIN D-3) 125 MCG (5000 UT) TABS Take 1 tablet by mouth daily. (Patient taking differently: Take 5,000 Units by mouth daily. )  . dapagliflozin propanediol (FARXIGA) 5 MG TABS tablet Take 1 tablet (5 mg total) by mouth daily before breakfast.  . Dulaglutide (TRULICITY) 3 MTL/5.7WISOPN Inject 3 mg into the skin once a week.  . Multiple Vitamins-Minerals (CENTRUM SILVER 50+WOMEN PO) Take 1 tablet by mouth daily.  .Marland Kitchenoxybutynin (DITROPAN-XL) 10 MG 24 hr tablet Take 1 tablet (10 mg total) by mouth daily.  . ReliOn Ultra Thin Lancets MISC 1 Device by Does not apply route 4 (four) times daily -  before meals and at bedtime. relion brand  . rosuvastatin (CRESTOR) 10 MG tablet Take 1 tablet (10 mg total)  by mouth every evening.  . sacubitril-valsartan (ENTRESTO) 97-103 MG Take 1 tablet by mouth 2 (two) times daily.  . sertraline (ZOLOFT) 100 MG tablet Take 1.5 tablets (150 mg total) by mouth daily.  . sodium bicarbonate 650 MG tablet Take 1 tablet (650 mg total) by mouth 3 (three) times daily.  . sodium chloride (OCEAN) 0.65 % SOLN nasal spray Place 1 spray into both nostrils as needed for congestion.   .Marland Kitchenspironolactone (ALDACTONE) 25 MG tablet Take 1 tablet (25 mg total) by mouth daily.   No facility-administered encounter medications on file as of 01/16/2020.    Allergies (verified) Penicillins   History: Past Medical History:  Diagnosis Date  . Anxiety   . Asthma   . Depression   . [place]   . History of kidney stones   . Hypertension   . Kidney stones   . Vertigo    when laying flat  . Vocal cord dysfunction    makes it hard for patient to talk and breathe   Past Surgical History:  Procedure Laterality Date  . CARDIAC CATHETERIZATION    . CYSTOSCOPY W/ RETROGRADES Right 09/27/2019   Procedure: CYSTOSCOPY WITH RETROGRADE PYELOGRAM;  Surgeon: SBilley Co MD;  Location: [facility] ORS;  Service: Urology;  Laterality: Right;  . CYSTOSCOPY/URETEROSCOPY/HOLMIUM LASER/STENT PLACEMENT Left 09/27/2019   Procedure: CYSTOSCOPY/URETEROSCOPY/HOLMIUM LASER/STENT PLACEMENT;  Surgeon: SBilley Co MD;  Location: [facility] ORS;  Service: Urology;  Laterality: Left;  . LITHOTRIPSY    . RIGHT/LEFT HEART CATH  AND CORONARY ANGIOGRAPHY Bilateral 04/08/2019   Procedure: RIGHT/LEFT HEART CATH AND CORONARY ANGIOGRAPHY;  Surgeon: Wellington Hampshire, MD;  Location: [place] [facility];  Service: Cardiovascular;  Laterality: Bilateral;   Family History  Problem Relation Age of Onset  . Hypertension Mother   . Diabetes Mother   . Hypertension Father   . Diabetes Father    Social History   Socioeconomic History  . Marital status: Divorced    Spouse name: Not on  file  . Number of children: 2  . Years of education: Not on file  . Highest education level: 9th grade  Occupational History  . Not on file  Tobacco Use  . Smoking status: Former Smoker    Packs/day: 1.00    Years: 10.00    Pack years: 10.00    Quit date: 01/03/1998    Years since quitting: 22.0  . Smokeless tobacco: Never Used  Vaping Use  . Vaping Use: Never used  Substance and Sexual Activity  . Alcohol use: Not Currently  . Drug use: Not on file    Comment: years ago  . Sexual activity: Not Currently  Other Topics Concern  . Not on file  Social History Narrative   Lives at home    2 daughters as of 04/04/19 64 y.o daughter lives with her (she works)   Patient is on disability.   Social Determinants of Health   Financial Resource Strain: Low Risk   . Difficulty of Paying Living Expenses: Not very hard  Food Insecurity: Food Insecurity Present  . Worried About Charity fundraiser in the Last Year: Sometimes true  . Ran Out of Food in the Last Year: Sometimes true  Transportation Needs: No Transportation Needs  . Lack of Transportation (Medical): No  . Lack of Transportation (Non-Medical): No  Physical Activity: Not on file  Stress: Stress Concern Present  . Feeling of Stress : To some extent  Social Connections: Not on file    Tobacco Counseling Counseling given: Not Answered   Clinical Intake:         Nutrition Risk Assessment: Has the patient had any N/V/D within the last 2 weeks?  Yes  Does the patient have any non-healing wounds?  Yes  Has the patient had any unintentional weight loss or weight gain?  Yes   Diabetes: If diabetic, was a CBG obtained today?  Yes , FBS 104 Did the patient bring in their glucometer from home?  No  How often do you monitor your CBG's? daily.    Diabetes Management: Is the patient seen by Chronic Care Management for management of their diabetes?  Yes   Diabetic Exams: Diabetic Eye Exam: Completed 01/14/20 Diabetic Foot  Exam: Completed 11/12/19                Activities of Daily Living In your present state of health, do you have any difficulty performing the following activities: 01/16/2020 08/22/2019  Hearing? N N  Vision? N N  Difficulty concentrating or making decisions? - N  Walking or climbing stairs? Y N  Comment Paces self sometimes gets dizzy  Dressing or bathing? - N  Doing errands, shopping? N N  Preparing Food and eating ? N -  Using the Toilet? N -  In the past six months, have you accidently leaked urine? Y -  Comment Managed with daily pad -  Do you have problems with loss of bowel control? N -  Managing your Medications? N -  Managing your Finances? N -  Comment - -  Housekeeping or managing your Housekeeping? N -  Comment - -  Some recent data might be hidden    Patient Care Team: McLean-Scocuzza, Nino Glow, MD as PCP - General (Internal Medicine) Kate Sable, MD as PCP - Cardiology (Cardiology) De Hollingshead, RPH-CPP as Pharmacist (Pharmacist) [name], RN as [place] Management  Indicate any recent Medical Services you may have received from other than [facility] providers in the past year (date may be approximate).     Assessment:   This is a routine wellness examination for [facility].  I connected with Nancy Barajas today by telephone and verified that I am speaking with the correct person using two identifiers. Location patient: home Location provider: work Persons participating in the virtual visit: patient, Marine scientist.    I discussed the limitations, risks, security and privacy concerns of performing an evaluation and management service by telephone and the availability of in person appointments. The patient expressed understanding and verbally consented to this telephonic visit.    Interactive audio and video telecommunications were attempted between this provider and patient, however failed, due to patient having technical  difficulties OR patient did not have access to video capability.  We continued and completed visit with audio only.  Some vital signs may be absent or patient reported.   Hearing/Vision screen No exam data present  Dietary issues and exercise activities discussed: Current Exercise Habits: Home exercise routine  Healthy diet, portioned controlled Good water intake  Goals: Follow up with pcp as needed  Depression Screen PHQ 2/9 Scores 01/16/2020 12/11/2019 12/05/2019 05/01/2019 04/04/2019 03/27/2019 03/01/2019  PHQ - 2 Score - '1 1 2 4 4 ' 0  PHQ- 9 Score - - - '5 13 13 ' -  Exception Documentation Other- indicate reason in comment box - - - - - -    Fall Risk Fall Risk  12/11/2019 11/12/2019 07/10/2019 05/01/2019 04/04/2019  Falls in the past year? 0 0 0 0 0  Number falls in past yr: 0 0 0 0 0  Injury with Fall? 0 0 0 0 0  Comment - - - N/A- no falls reported -  Risk for fall due to : Medication side effect - - Medication side effect -  Risk for fall due to: Comment - - - Insulin -  Follow up Education provided;Falls prevention discussed;Falls evaluation completed Falls evaluation completed Falls evaluation completed Falls prevention discussed Falls evaluation completed    FALL RISK PREVENTION PERTAINING TO THE HOME: Handrails in use when climbing stairs? Yes Home free of loose throw rugs in walkways, pet beds, electrical cords, etc? Yes  Adequate lighting in your home to reduce risk of falls? Yes   ASSISTIVE DEVICES UTILIZED TO PREVENT FALLS: Use of a cane, walker or w/c? No   TIMED UP AND GO: Was the test performed? No . Virtual vision.   Cognitive Function:  Patient is alert and oriented x3.  Denies difficulty making decisions, memory loss.  MMSE/6CIT deferred. Normal by direct communication/observation.    6CIT Screen 01/15/2019  What Year? 0 points  What month? 0 points  What time? 0 points  Count back from 20 0 points  Months in reverse 0 points  Repeat phrase 0 points  Total  Score 0    Immunizations Immunization History  Administered Date(s) Administered  . Influenza,inj,Quad PF,6+ Mos 10/10/2017, 11/07/2018, 11/12/2019  . PFIZER SARS-COV-2 Vaccination 05/23/2019, 06/13/2019, 12/10/2019  . Pneumococcal Polysaccharide-23 07/10/2019  TDAP status: Due, Education has been provided regarding the importance of this vaccine. Advised may receive this vaccine at local pharmacy or Health Dept. Aware to provide a copy of the vaccination record if obtained from local pharmacy or Health Dept. Verbalized acceptance and understanding. Deferred.   Health Maintenance Health Maintenance  Topic Date Due  . TETANUS/TDAP  Never done  . MAMMOGRAM  Never done  . URINE MICROALBUMIN  02/20/2020  . COLONOSCOPY (Pts 45-37yr Insurance coverage will need to be confirmed)  01/15/2021 (Originally 11/02/2004)  . HEMOGLOBIN A1C  05/11/2020  . FOOT EXAM  11/11/2020  . OPHTHALMOLOGY EXAM  01/13/2021  . PAP SMEAR-Modifier  02/05/2021  . INFLUENZA VACCINE  Completed  . PNEUMOCOCCAL POLYSACCHARIDE VACCINE AGE 19-64 HIGH RISK  Completed  . COVID-19 Vaccine  Completed  . Hepatitis C Screening  Completed  . HIV Screening  Completed   Colorectal cancer screening: Type of screening: Cologuard. Completed 11/12/19. Repeat every 3 years. Plan to schedule colonoscopy later in the season.   Mammogram- ordered 03/01/19. Reordered today. Number provided for scheduling.   Lung Cancer Screening: (Low Dose CT Chest recommended if Age 61-80years, 30 pack-year currently smoking OR have quit w/in 15years.) does not qualify.   Hepatitis C Screening: Completed 07/10/19.  Vision Screening: Recommended annual ophthalmology exams for early detection of glaucoma and other disorders of the eye. Is the patient up to date with their annual eye exam?  Yes  Who is the provider or what is the name of the office in which the patient attends annual eye exams? [facility]  Dental Screening: Recommended annual dental  exams for proper oral hygiene.   Community Resource Referral / Chronic Care Management: CRR required this visit?  No   CCM required this visit?  No      Plan:    Keep all routine maintenance appointments.   I have personally reviewed and noted the following in the patient's chart:   . Medical and social history . Use of alcohol, tobacco or illicit drugs  . Current medications and supplements . Functional ability and status . Nutritional status . Physical activity . Advanced directives . List of other physicians . Hospitalizations, surgeries, and ER visits in previous 12 months . Vitals . Screenings to include cognitive, depression, and falls . Referrals and appointments  In addition, I have reviewed and discussed with patient certain preventive protocols, quality metrics, and best practice recommendations. A written personalized care plan for preventive services as well as general preventive health recommendations were provided to patient via mychart.     OVarney Biles LPN   [id number]

## 2020-01-16 NOTE — Telephone Encounter (Signed)
I'm not sure I understand the issue, as it WAS written for 2 mL (which is 4 pens, each pen is 0.5 mL).   Resent for 6 mL (3 month supply) as patient has Medicare Extra Help, so she pays the same copay whether she fills a 3 month supply or 1 month supply

## 2020-01-16 NOTE — Telephone Encounter (Signed)
Patient notes she has not yet received refill of Trulicity from local pharmacy and has 1 dose left. Notes pharmacy states waiting for [place] to respond. See below.    Thressa Sheller, [place]     01/07/20 2:56 PM Note Called pharmacy and they state the script needed to be changed from 5 pens to 4 pens as that is all that comes in the box.   Medication sent in to preferred pharmacy per protocol.   Patient informed and verbalized understanding.

## 2020-01-16 NOTE — Progress Notes (Signed)
[facility] MD/PA/NP OP Progress Note  01/16/2020 9:40 AM Nancy Barajas  MRN:  [id number] Interview was conducted by phone and I verified that I was speaking with the correct person using two identifiers. I discussed the limitations of evaluation and management by telemedicine and  the availability of in person appointments. Patient expressed understanding and agreed to proceed. Participants in the visit: patient (location - home); physician (location - home office).  Chief Complaint: "I worry less".  HPI: 60 yo DAAF with long hx of anxiety and some depression. She is disabled and lives with younger daughter who works (3rd shift). She admits to worrying about variety of things: heath (primarily), daughter, finances. COVID pandemia did not help either. She denies feeling depressed or hopeless. Sleep and appetite are normal. She had been prescribed sertraline 50 mg over 30 years ago; we have resumed it 6 months ago, increased dose from 50 to 100 mg after one week. took it for a month or so but it did not help with anxiety. We have added sertraline 100 mg daily (was on 50 mg for the  first week) and then increased dose to 150 mg three months ago. She tolerates it well and reports that anxiety has subsided. She remains in individual counseling and finds it helpful.    Visit Diagnosis:    ICD-10-CM   1. GAD (generalized anxiety disorder)  F41.1     Past Psychiatric History: Please see intake H&P.  Past Medical History:  Past Medical History:  Diagnosis Date  . Anxiety   . Asthma   . Depression   . Diabetes mellitus without complication ([place])   . History of kidney stones   . Hypertension   . Kidney stones   . Vertigo    when laying flat  . Vocal cord dysfunction    makes it hard for patient to talk and breathe    Past Surgical History:  Procedure Laterality Date  . CARDIAC CATHETERIZATION    . CYSTOSCOPY W/ RETROGRADES Right 09/27/2019   Procedure: CYSTOSCOPY WITH RETROGRADE PYELOGRAM;   Surgeon: Billey Co, MD;  Location: [facility] ORS;  Service: Urology;  Laterality: Right;  . CYSTOSCOPY/URETEROSCOPY/HOLMIUM LASER/STENT PLACEMENT Left 09/27/2019   Procedure: CYSTOSCOPY/URETEROSCOPY/HOLMIUM LASER/STENT PLACEMENT;  Surgeon: Billey Co, MD;  Location: [facility] ORS;  Service: Urology;  Laterality: Left;  . LITHOTRIPSY    . RIGHT/LEFT HEART CATH AND CORONARY ANGIOGRAPHY Bilateral 04/08/2019   Procedure: RIGHT/LEFT HEART CATH AND CORONARY ANGIOGRAPHY;  Surgeon: Wellington Hampshire, MD;  Location: [place] [facility];  Service: Cardiovascular;  Laterality: Bilateral;    Family Psychiatric History: None.  Family History:  Family History  Problem Relation Age of Onset  . Hypertension Mother   . Diabetes Mother   . Hypertension Father   . Diabetes Father     Social History:  Social History   Socioeconomic History  . Marital status: Divorced    Spouse name: Not on file  . Number of children: 2  . Years of education: Not on file  . Highest education level: 9th grade  Occupational History  . Not on file  Tobacco Use  . Smoking status: Former Smoker    Packs/day: 1.00    Years: 10.00    Pack years: 10.00    Quit date: 01/03/1998    Years since quitting: 22.0  . Smokeless tobacco: Never Used  Vaping Use  . Vaping Use: Never used  Substance and Sexual Activity  . Alcohol use: Not Currently  . Drug use:  Not on file    Comment: years ago  . Sexual activity: Not Currently  Other Topics Concern  . Not on file  Social History Narrative   Lives at home    2 daughters as of 04/04/19 63 y.o daughter lives with her (she works)   Patient is on disability.   Social Determinants of Health   Financial Resource Strain: Low Risk   . Difficulty of Paying Living Expenses: Not very hard  Food Insecurity: Food Insecurity Present  . Worried About Charity fundraiser in the Last Year: Sometimes true  . Ran Out of Food in the Last Year: Sometimes true  Transportation Needs: No  Transportation Needs  . Lack of Transportation (Medical): No  . Lack of Transportation (Non-Medical): No  Physical Activity: Not on file  Stress: Stress Concern Present  . Feeling of Stress : To some extent  Social Connections: Not on file    Allergies:  Allergies  Allergen Reactions  . Penicillins Rash    Did it involve swelling of the face/tongue/throat, SOB, or low BP? No Did it involve sudden or severe rash/hives, skin peeling, or any reaction on the inside of your mouth or nose? No Did you need to seek medical attention at a hospital or doctor's office? No When did it last happen?Childhood If all above answers are "NO", may proceed with cephalosporin use.    Metabolic Disorder Labs: Lab Results  Component Value Date   HGBA1C 6.4 11/12/2019   No results found for: PROLACTIN Lab Results  Component Value Date   CHOL 186 11/12/2019   TRIG 67.0 11/12/2019   HDL 87.70 11/12/2019   CHOLHDL 2 11/12/2019   VLDL 13.4 11/12/2019   LDLCALC 85 11/12/2019   LDLCALC 67 02/20/2019   Lab Results  Component Value Date   TSH 0.85 11/12/2019   TSH 0.93 11/07/2018    Therapeutic Level Labs: No results found for: LITHIUM No results found for: VALPROATE No components found for:  CBMZ  Current Medications: Current Outpatient Medications  Medication Sig Dispense Refill  . blood glucose meter kit and supplies Dispense based on patient and insurance preference. Use up to four times daily as directed. (FOR ICD-10 E10.9, E11.9). 1 each 0  . carvedilol (COREG) 25 MG tablet Take 1 tablet (25 mg total) by mouth 2 (two) times daily with a meal. 180 tablet 3  . Cholecalciferol (VITAMIN D-3) 125 MCG (5000 UT) TABS Take 1 tablet by mouth daily. (Patient taking differently: Take 5,000 Units by mouth daily. ) 90 tablet 3  . dapagliflozin propanediol (FARXIGA) 5 MG TABS tablet Take 1 tablet (5 mg total) by mouth daily before breakfast.    . Dulaglutide (TRULICITY) 3 BL/3.9QZ SOPN Inject 3  mg into the skin once a week. 2 mL 6  . Multiple Vitamins-Minerals (CENTRUM SILVER 50+WOMEN PO) Take 1 tablet by mouth daily.    Marland Kitchen oxybutynin (DITROPAN-XL) 10 MG 24 hr tablet Take 1 tablet (10 mg total) by mouth daily. 30 tablet 11  . ReliOn Ultra Thin Lancets MISC 1 Device by Does not apply route 4 (four) times daily -  before meals and at bedtime. relion brand 400 each 12  . rosuvastatin (CRESTOR) 10 MG tablet Take 1 tablet (10 mg total) by mouth every evening. 90 tablet 3  . sacubitril-valsartan (ENTRESTO) 97-103 MG Take 1 tablet by mouth 2 (two) times daily. 60 tablet 3  . sertraline (ZOLOFT) 100 MG tablet Take 1.5 tablets (150 mg total) by mouth daily.  135 tablet 1  . sodium bicarbonate 650 MG tablet Take 1 tablet (650 mg total) by mouth 3 (three) times daily. 180 tablet 5  . sodium chloride (OCEAN) 0.65 % SOLN nasal spray Place 1 spray into both nostrils as needed for congestion.     Marland Kitchen spironolactone (ALDACTONE) 25 MG tablet Take 1 tablet (25 mg total) by mouth daily. 90 tablet 2   No current facility-administered medications for this visit.      Psychiatric Specialty Exam: Review of Systems  Psychiatric/Behavioral: The patient is nervous/anxious.   All other systems reviewed and are negative.   Last menstrual period 01/03/2013.There is no height or weight on file to calculate BMI.  General Appearance: NA  Eye Contact:  NA  Speech:  Clear and Coherent and Normal Rate  Volume:  Normal  Mood:  Anxious  Affect:  NA  Thought Process:  Goal Directed  Orientation:  Full (Time, Place, and Person)  Thought Content: Rumination   Suicidal Thoughts:  No  Homicidal Thoughts:  No  Memory:  Immediate;   Good Recent;   Good Remote;   Good  Judgement:  Good  Insight:  Fair  Psychomotor Activity:  NA  Concentration:  Concentration: Good  Recall:  Good  Fund of Knowledge: Good  Language: Good  Akathisia:  Negative  Handed:  Right  AIMS (if indicated): not done  Assets:   Communication Skills Desire for Improvement Financial Resources/Insurance Housing Social Support  ADL's:  Intact  Cognition: WNL  Sleep:  Good   Screenings: GAD-7   Flowsheet Row Office Visit from 04/04/2019 in [place]  Total GAD-7 Score 11    PHQ2-9   [facility] Patient Outreach Telephone from 12/11/2019 in [facility] Patient Outreach Telephone from 12/05/2019 in [facility] Patient Outreach Telephone from 05/01/2019 in [facility] Visit from 04/04/2019 in [facility] from 03/27/2019 in [place]  PHQ-2 Total Score '1 1 2 4 4  ' PHQ-9 Total Score - - '5 13 13       ' Assessment and Plan: 60 yo DAAF with long hx of anxiety and some depression. She is disabled and lives with younger daughter who works (3rd shift). She admits to worrying about variety of things: heath (primarily), daughter, finances. COVID pandemia did not help either. She denies feeling depressed or hopeless. Sleep and appetite are normal. She had been prescribed sertraline 50 mg over 30 years ago; we have resumed it 6 months ago, increased dose from 50 to 100 mg after one week. took it for a month or so but it did not help with anxiety. We have added sertraline 100 mg daily (was on 50 mg for the  first week) and then increased dose to 150 mg three months ago. She tolerates it well and reports that anxiety has subsided. She remains in individual counseling and finds it helpful.  Dx: Generalized anxiety disorder  Plan: We will continue sertraline 150 mg daily. Next appointment in63month with a new psychitrist.The plan was discussed with patient who had an opportunity to ask questions and these were all answered. I spend178mutes in phone consultation with the patient.   OlStephanie AcreMD 01/16/2020, 9:40 AM

## 2020-01-16 NOTE — Patient Instructions (Addendum)
Nancy Barajas , Thank you for taking time to come for your Medicare Wellness Visit. I appreciate your ongoing commitment to your health goals. Please review the following plan we discussed and let me know if I can assist you in the future.   These are the goals we discussed: Goals at [facility]: Follow up with pcp as needed.    Pharmacist: Goals    . [facility]) Find Help in My Community     Timeframe:  Short-Term Goal Priority:  High Start Date:  12/11/2019                           Expected End Date:  04/02/2020                        - follow-up on any referrals for help I am given - think ahead to make sure my need does not become an emergency - make a note about what I need to have by the phone or take with me, like an identification card or social security number have a back-up plan -contact Social Services to speak with your Medicaid Worker to discuss re enrollment and assistance with medical bills  -contact provider offices to arrange payment arrangements for medical bills/copays    Why is this important?    Knowing how and where to find help for yourself or family in your neighborhood and community is an important skill.   You will want to take some steps to learn how.    Notes:     . [facility]) Make and Keep All Appointments     Timeframe:  Long-Range Goal Priority:  Medium Start Date:  10/07/2019                       Expected End Date:  07/02/2020                       - call to cancel if needed - keep a calendar with appointment dates -schedule dental appointment after new year once insurance is available -schedule eye exam in January 2022    Why is this important?   Part of staying healthy is seeing the doctor for follow-up care.  If you forget your appointments, there are some things you can do to stay on track.    Notes:   Patient reports continues to drive self to appointments Appointments reviewed with patient who verbalizes plans to attend all as scheduled  11/07/19:  Reviewed all recent/ upcoming provider appointments with patient who verbalizes accurate understanding of all with plans to attend all as scheduled; discussed need for flu vaccination for 2021 and encouraged patient to obtain at upcoming scheduled PCP office visit 11/12/19  12/05/19: Goal successfully met; confirmed that patient has accurate understanding of provider appointments and continues driving self to provider appointments; patient very organized and keeps calendar of all scheduled appointments    . [facility]) Monitor and Manage My Blood Sugar     Timeframe:  Long-Range Goal Priority:  Medium Start Date:  10/07/2019                           Expected End Date:  07/02/20                       - check blood sugar at prescribed times - check  blood sugar if I feel it is too high or too low - enter blood sugar readings and medication or insulin into daily log - take the blood sugar log to all doctor visits  -continue to monitor weights daily -continue to monitor blood pressures daily   Why is this important?   Checking your blood sugar at home helps to keep it from getting very high or very low.  Writing the results in a diary or log helps the doctor know how to care for you.  Your blood sugar log should have the time, date and the results.  Also, write down the amount of insulin or other medicine that you take.  Other information, like what you ate, exercise done and how you were feeling, will also be helpful.     Notes:   10/07/19: reviewed with patient all recent blood sugar values from both CGM and glucometer  11/08/19: reviewed recent blood sugars with patient; confirmed patient using CGM consistently; reviewed with patient signs/ symptoms low blood sugar along with corresponding action plan; printed educational material mailed to patient around same  12/05/19:  -- confirmed patient continues monitoring/ recording blood sugars at home 2-3 times per day; reviewed blood sugars with  patient- she reports excellent blood sugar control at home with fasting/ post-prandial ranges between 83-140, with one isolated high of 175 -- reviewed most recent A1-C with patient: 6.4-- positive reinforcement provided -- confirmed patient continues following diabetic/ low carb diet and sent care guide message to follow up around patient's request for food resources -- discussed patient's weight loss since May 2021: 36 pounds-- positive reinforcement provided -- Referral placed for [facility] RN [place]- encouraged patient's engagement with [facility] once outreach is established  12/11/19--Encouraged to continue to monitor blood sugars; reviewed signs and symptoms of hypoglycemia and encouraged patient to monitor for signs and symptoms    . [facility]) Obtain Eye Exam-Diabetes Type 2     Timeframe:  Short-Term Goal Priority:  Medium Start Date:  12/11/2019                           Expected End Date:  04/02/2020                       - schedule appointment with eye doctor by January 2022    Why is this important?    Eye check-ups are important when you have diabetes.   Vision loss can be prevented.    Notes:     . [facility]) Set My Target A1C-Diabetes Type 2     Timeframe:  Long-Range Goal Priority:  Medium Start Date:   12/11/2019                          Expected End Date:  07/02/2020                       - set target A1C; current A1C 6.4 (11/12/2019) -discuss with PCP goal Hgb A1C    Why is this important?    Your target A1C is decided together by you and your doctor.   It is based on several things like your age and other health issues.    Notes:        This is a list of the screening recommended for you and due dates:  Health Maintenance  Topic Date Due  .  Tetanus Vaccine  Never done  . Mammogram  Never done  . Urine Protein Check  02/20/2020  . Colon Cancer Screening  01/15/2021*  . Hemoglobin A1C  05/11/2020  . Complete foot exam   11/11/2020  . Eye exam for diabetics   01/13/2021  . Pap Smear  02/05/2021  . Flu Shot  Completed  . Pneumococcal vaccine  Completed  . COVID-19 Vaccine  Completed  .  Hepatitis C: One time screening is recommended by Center for Disease Control  (CDC) for  adults born from [birth year] through 1966.   Completed  . HIV Screening  Completed  *Topic was postponed. The date shown is not the original due date.   Immunizations Immunization History  Administered Date(s) Administered  . Influenza,inj,Quad PF,6+ Mos 10/10/2017, 11/07/2018, 11/12/2019  . PFIZER SARS-COV-2 Vaccination 05/23/2019, 06/13/2019  . Pneumococcal Polysaccharide-23 07/10/2019   Advanced directives: not yet completed  Follow up in one year for your annual wellness visit.   Preventive Care 40-64 Years, Female Preventive care refers to lifestyle choices and visits with your health care provider that can promote health and wellness. What does preventive care include?  A yearly physical exam. This is also called an annual well check.  Dental exams once or twice a year.  Routine eye exams. Ask your health care provider how often you should have your eyes checked.  Personal lifestyle choices, including:  Daily care of your teeth and gums.  Regular physical activity.  Eating a healthy diet.  Avoiding tobacco and drug use.  Limiting alcohol use.  Practicing safe sex.  Taking low-dose aspirin daily starting at age 46.  Taking vitamin and mineral supplements as recommended by your health care provider. What happens during an annual well check? The services and screenings done by your health care provider during your annual well check will depend on your age, overall health, lifestyle risk factors, and family history of disease. Counseling  Your health care provider may ask you questions about your:  Alcohol use.  Tobacco use.  Drug use.  Emotional well-being.  Home and relationship well-being.  Sexual activity.  Eating habits.  Work and work  Statistician.  Method of birth control.  Menstrual cycle.  Pregnancy history. Screening  You may have the following tests or measurements:  Height, weight, and BMI.  Blood pressure.  Lipid and cholesterol levels. These may be checked every 5 years, or more frequently if you are over 49 years old.  Skin check.  Lung cancer screening. You may have this screening every year starting at age 34 if you have a 30-pack-year history of smoking and currently smoke or have quit within the past 15 years.  Fecal occult blood test (FOBT) of the stool. You may have this test every year starting at age 20.  Flexible sigmoidoscopy or colonoscopy. You may have a sigmoidoscopy every 5 years or a colonoscopy every 10 years starting at age 15.  Hepatitis C blood test.  Hepatitis B blood test.  Sexually transmitted disease (STD) testing.  Diabetes screening. This is done by checking your blood sugar (glucose) after you have not eaten for a while (fasting). You may have this done every 1-3 years.  Mammogram. This may be done every 1-2 years. Talk to your health care provider about when you should start having regular mammograms. This may depend on whether you have a family history of breast cancer.  BRCA-related cancer screening. This may be done if you have a family history of breast, ovarian,  tubal, or peritoneal cancers.  Pelvic exam and Pap test. This may be done every 3 years starting at age 7. Starting at age 27, this may be done every 5 years if you have a Pap test in combination with an HPV test.  Bone density scan. This is done to screen for osteoporosis. You may have this scan if you are at high risk for osteoporosis. Discuss your test results, treatment options, and if necessary, the need for more tests with your health care provider. Vaccines  Your health care provider may recommend certain vaccines, such as:  Influenza vaccine. This is recommended every year.  Tetanus, diphtheria,  and acellular pertussis (Tdap, Td) vaccine. You may need a Td booster every 10 years.  Zoster vaccine. You may need this after age 46.  Pneumococcal 13-valent conjugate (PCV13) vaccine. You may need this if you have certain conditions and were not previously vaccinated.  Pneumococcal polysaccharide (PPSV23) vaccine. You may need one or two doses if you smoke cigarettes or if you have certain conditions. Talk to your health care provider about which screenings and vaccines you need and how often you need them. This information is not intended to replace advice given to you by your health care provider. Make sure you discuss any questions you have with your health care provider. Document Released: 01/16/2015 Document Revised: 09/09/2015 Document Reviewed: 10/21/2014 Elsevier Interactive Patient Education  2017 [place] Prevention in the Home Falls can cause injuries. They can happen to people of all ages. There are many things you can do to make your home safe and to help prevent falls. What can I do on the outside of my home?  Regularly fix the edges of walkways and driveways and fix any cracks.  Remove anything that might make you trip as you walk through a door, such as a raised step or threshold.  Trim any bushes or trees on the path to your home.  Use bright outdoor lighting.  Clear any walking paths of anything that might make someone trip, such as rocks or tools.  Regularly check to see if handrails are loose or broken. Make sure that both sides of any steps have handrails.  Any raised decks and porches should have guardrails on the edges.  Have any leaves, snow, or ice cleared regularly.  Use sand or salt on walking paths during winter.  Clean up any spills in your garage right away. This includes oil or grease spills. What can I do in the bathroom?  Use night lights.  Install grab bars by the toilet and in the tub and shower. Do not use towel bars as grab  bars.  Use non-skid mats or decals in the tub or shower.  If you need to sit down in the shower, use a plastic, non-slip stool.  Keep the floor dry. Clean up any water that spills on the floor as soon as it happens.  Remove soap buildup in the tub or shower regularly.  Attach bath mats securely with double-sided non-slip rug tape.  Do not have throw rugs and other things on the floor that can make you trip. What can I do in the bedroom?  Use night lights.  Make sure that you have a light by your bed that is easy to reach.  Do not use any sheets or blankets that are too big for your bed. They should not hang down onto the floor.  Have a firm chair that has side arms.  You can use this for support while you get dressed.  Do not have throw rugs and other things on the floor that can make you trip. What can I do in the kitchen?  Clean up any spills right away.  Avoid walking on wet floors.  Keep items that you use a lot in easy-to-reach places.  If you need to reach something above you, use a strong step stool that has a grab bar.  Keep electrical cords out of the way.  Do not use floor polish or wax that makes floors slippery. If you must use wax, use non-skid floor wax.  Do not have throw rugs and other things on the floor that can make you trip. What can I do with my stairs?  Do not leave any items on the stairs.  Make sure that there are handrails on both sides of the stairs and use them. Fix handrails that are broken or loose. Make sure that handrails are as long as the stairways.  Check any carpeting to make sure that it is firmly attached to the stairs. Fix any carpet that is loose or worn.  Avoid having throw rugs at the top or bottom of the stairs. If you do have throw rugs, attach them to the floor with carpet tape.  Make sure that you have a light switch at the top of the stairs and the bottom of the stairs. If you do not have them, ask someone to add them for  you. What else can I do to help prevent falls?  Wear shoes that:  Do not have high heels.  Have rubber bottoms.  Are comfortable and fit you well.  Are closed at the toe. Do not wear sandals.  If you use a stepladder:  Make sure that it is fully opened. Do not climb a closed stepladder.  Make sure that both sides of the stepladder are locked into place.  Ask someone to hold it for you, if possible.  Clearly mark and make sure that you can see:  Any grab bars or handrails.  First and last steps.  Where the edge of each step is.  Use tools that help you move around (mobility aids) if they are needed. These include:  Canes.  Walkers.  Scooters.  Crutches.  Turn on the lights when you go into a dark area. Replace any light bulbs as soon as they burn out.  Set up your furniture so you have a clear path. Avoid moving your furniture around.  If any of your floors are uneven, fix them.  If there are any pets around you, be aware of where they are.  Review your medicines with your doctor. Some medicines can make you feel dizzy. This can increase your chance of falling. Ask your doctor what other things that you can do to help prevent falls. This information is not intended to replace advice given to you by your health care provider. Make sure you discuss any questions you have with your health care provider. Document Released: 10/16/2008 Document Revised: 05/28/2015 Document Reviewed: 01/24/2014 Elsevier Interactive Patient Education  2017 [employer].

## 2020-01-17 NOTE — Telephone Encounter (Signed)
Noted  Left message informing the Patient of this

## 2020-01-29 ENCOUNTER — Ambulatory Visit (INDEPENDENT_AMBULATORY_CARE_PROVIDER_SITE_OTHER): Payer: Medicare HMO | Admitting: Psychology

## 2020-01-29 DIAGNOSIS — F418 Other specified anxiety disorders: Secondary | ICD-10-CM

## 2020-01-29 DIAGNOSIS — R69 Illness, unspecified: Secondary | ICD-10-CM | POA: Diagnosis not present

## 2020-01-30 ENCOUNTER — Ambulatory Visit: Payer: Medicare HMO | Admitting: Pharmacist

## 2020-01-30 DIAGNOSIS — N183 Chronic kidney disease, stage 3 unspecified: Secondary | ICD-10-CM

## 2020-01-30 DIAGNOSIS — I10 Essential (primary) hypertension: Secondary | ICD-10-CM

## 2020-01-30 DIAGNOSIS — E782 Mixed hyperlipidemia: Secondary | ICD-10-CM

## 2020-01-30 DIAGNOSIS — E1122 Type 2 diabetes mellitus with diabetic chronic kidney disease: Secondary | ICD-10-CM

## 2020-01-30 DIAGNOSIS — E119 Type 2 diabetes mellitus without complications: Secondary | ICD-10-CM

## 2020-01-30 DIAGNOSIS — I5020 Unspecified systolic (congestive) heart failure: Secondary | ICD-10-CM

## 2020-01-30 MED ORDER — TRULICITY 1.5 MG/0.5ML ~~LOC~~ SOAJ: 1.5000 mg | SUBCUTANEOUS | 3 refills | Status: DC

## 2020-01-31 NOTE — Chronic Care Management (AMB) (Signed)
Chronic Care Management Pharmacy Note  01/31/2020 Name:  Nancy Barajas MRN:  [id number] DOB:  02-Jun-1960  Subjective: Nancy Barajas is an 60 y.o. year old female who is a primary patient of McLean-Scocuzza, Nino Glow, MD.  The CCM team was consulted for assistance with disease management and care coordination needs.    Engaged with patient by telephone for follow up visit in response to provider referral for pharmacy case management and/or care coordination services.   Consent to Services:  The patient was given information about Chronic Care Management services, agreed to services, and gave verbal consent prior to initiation of services.  Please see initial visit note for detailed documentation.   Objective:  Lab Results  Component Value Date   CREATININE 1.19 11/12/2019   CREATININE 1.21 (H) 05/14/2019   CREATININE 1.24 (H) 04/02/2019    Lab Results  Component Value Date   HGBA1C 6.4 11/12/2019       Component Value Date/Time   CHOL 186 11/12/2019 0947   TRIG 67.0 11/12/2019 0947   HDL 87.70 11/12/2019 0947   CHOLHDL 2 11/12/2019 0947   VLDL 13.4 11/12/2019 0947   LDLCALC 85 11/12/2019 0947    BP Readings from Last 3 Encounters:  01/01/20 (!) 161/86  11/20/19 (!) 89/53  11/15/19 140/80    Assessment: Review of patient past medical history, allergies, medications, health status, including review of consultants reports, laboratory and other test data, was performed as part of comprehensive evaluation and provision of chronic care management services.   SDOH:  (Social Determinants of Health) assessments and interventions performed:  SDOH Interventions   Flowsheet Row Most Recent Value  SDOH Interventions   Financial Strain Interventions Intervention Not Indicated  Stress Interventions Provide Counseling, Other (Comment)  [encouraged continued collaboration wtih psych]      CCM Care Plan  Allergies  Allergen Reactions  . Penicillins Rash    Did it  involve swelling of the face/tongue/throat, SOB, or low BP? No Did it involve sudden or severe rash/hives, skin peeling, or any reaction on the inside of your mouth or nose? No Did you need to seek medical attention at a hospital or doctor's office? No When did it last happen?Childhood If all above answers are "NO", may proceed with cephalosporin use.    Medications Reviewed Today    Reviewed by De Hollingshead, RPH-CPP (Pharmacist) on 01/30/20 at [phone]  Med List Status: <None>  Medication Order Taking? Sig Documenting Provider Last Dose Status Informant  blood glucose meter kit and supplies [id number] Yes Dispense based on patient and insurance preference. Use up to four times daily as directed. (FOR ICD-10 E10.9, E11.9). Jodelle Green, FNP Taking Active Self  carvedilol (COREG) 25 MG tablet [id number] Yes Take 1 tablet (25 mg total) by mouth 2 (two) times daily with a meal. McLean-Scocuzza, Nino Glow, MD Taking Active Self  Cholecalciferol (VITAMIN D-3) 125 MCG (5000 UT) TABS [id number] Yes Take 1 tablet by mouth daily.  Patient taking differently: Take 5,000 Units by mouth daily.   Jodelle Green, FNP Taking Active Self  Dulaglutide (TRULICITY) 1.5 VP/0.3EK SOPN [id number] Yes Inject 1.5 mg into the skin once a week. McLean-Scocuzza, Nino Glow, MD  Active   FARXIGA 10 MG TABS tablet [id number] Yes Take 10 mg by mouth daily. [provider] Taking Active   Multiple Vitamins-Minerals (CENTRUM SILVER 50+WOMEN PO) [id number] Yes Take 1 tablet by mouth daily. [provider] Taking Active Self  oxybutynin (DITROPAN-XL) 10 MG 24 hr tablet  [id number] No Take 1 tablet (10 mg total) by mouth daily.  Patient not taking: Reported on 01/30/2020   Billey Co, MD Not Taking Active   ReliOn Ultra Thin Lancets MISC [id number] Yes 1 Device by Does not apply route 4 (four) times daily -  before meals and at bedtime. relion brand McLean-Scocuzza, Nino Glow, MD Taking Active Self   rosuvastatin (CRESTOR) 10 MG tablet [id number] Yes Take 1 tablet (10 mg total) by mouth every evening. McLean-Scocuzza, Nino Glow, MD Taking Active   sacubitril-valsartan [facility]) 97-103 MG [id number] Yes Take 1 tablet by mouth 2 (two) times daily. Kate Sable, MD Taking Active   sertraline (ZOLOFT) 100 MG tablet [id number] Yes Take 1.5 tablets (150 mg total) by mouth daily. Pucilowski, Marchia Bond, MD Taking Active   sodium bicarbonate 650 MG tablet [id number] Yes Take 1 tablet (650 mg total) by mouth 3 (three) times daily. Billey Co, MD Taking Active   sodium chloride (OCEAN) 0.65 % SOLN nasal spray [id number] Yes Place 1 spray into both nostrils as needed for congestion.  [provider] Taking Active Self  spironolactone (ALDACTONE) 25 MG tablet [id number] Yes Take 1 tablet (25 mg total) by mouth daily. Kate Sable, MD Taking Active           Patient Active Problem List   Diagnosis Date Noted  . Obstructive uropathy 08/01/2019  . Gallstones 07/28/2019  . Umbilical hernia 33/29/5188  . Hematuria 07/15/2019  . Hypertension associated with diabetes ([place]) 07/11/2019  . Vitamin D deficiency 07/11/2019  . Hiatal hernia 07/11/2019  . Obesity (BMI 30.0-34.9) 07/11/2019  . Heart failure with reduced ejection fraction ([place])   . GAD (generalized anxiety disorder) 04/06/2019  . Proteinuria 03/04/2019  . Aortic atherosclerosis ([place]) 03/04/2019  . Thyroid nodule 03/04/2019  . Kidney cysts 03/04/2019  . Hemangioma of liver 03/04/2019  . Sinusitis 03/04/2019  . Kidney stones   . CKD stage 3 secondary to diabetes ([place]) 08/22/2018  . Laryngeal spasm 05/07/2018  . Muscle tension dysphonia 05/07/2018  . Shortness of breath 01/22/2018  . Uncontrolled hypertension 01/22/2018  . Type 2 diabetes mellitus without complication, without long-term current use of insulin ([place]) 01/22/2018  . Mixed hyperlipidemia 01/22/2018  . Mild persistent asthma 01/22/2018    Conditions to be  addressed/monitored: CHF, HTN, DMII, Anxiety and CKD  Care Plan : Medication Management (Pharmacist)  Updates made by De Hollingshead, RPH-CPP since 01/31/2020 12:00 AM    Problem: Diabetes, CKD, HF, HTN, Anxiety     Long-Range Goal: Disease Progression Prevention   Start Date: 01/30/2020  This Visit's Progress: On track  Priority: High  Note:   Current Barriers:   Unable to independently afford treatment regimen  Complex patient with multiple disease states at increased risk for disease progression   Pharmacist Clinical Goal(s):   Over the next 90 days, patient will maintain control of diabetes and hypertension as evidenced by maintenance of lab work through collaboration with PharmD and provider.  Interventions: . 1:1 collaboration with McLean-Scocuzza, Nino Glow, MD regarding development and update of comprehensive plan of care as evidenced by provider attestation and co-signature . Inter-disciplinary care team collaboration (see longitudinal plan of care) . Comprehensive medication review performed; medication list updated in electronic medical record  Health Maintenance: . Reports today that she has had concerns with filling medications, costs since the start of the year. Contacted [employer]. Appears they need her new insurance card. Patient took this by the pharmacy and copays ran through appropriately. Patient  has all medications.  . Enrolled in a new [employer] plan. Unsure about dental benefits. Had recent dental work done and has a Geographical information systems officer. Wonders if someone could help her navigate benefits or any community resources. Care Guide referral placed.   Diabetes:  Controlled; current treatment: Trulicity 3 mg weekly (has been out for the past 3 weeks), Farxiga 10 mg daily    Avoiding metformin d/t CKD   Current glucose readings (on Farxiga 10 mg daily): fastings: 90-120s ; 2 hour post prandial: 150-170s  Anticipate significant GI upset with restarting Trulicity at 3 mg weekly  after being off for almost a month. Restart Trulicity at 1.5 mg weekly. Additionally appropriate as patient is endorsing well controlled readings on Farxiga monotherapy (though may still be lingering effects of Trulicity). Restart Trulicity at 1.5 mg weekly, continue Farxiga 10 mg daily.   Continue to monitor glucose at least BID - fasting and 2 hours after meals   Hypertension with HFrEF (most recent EF 25-30%)  Uncontrolled per last clinic reading, but home readings near goal; current treatment: carvedilol 25 mg BID, spironolactone 25 mg daily, Entresto 97/103 mg BID   Current home BP readings: does not have readings with her, but reports readings in 120-130s/80s.   Reiterated appropriate BP checking technique. Anxiety likely contributing to higher office readings. Encouraged patient to ask that providers recheck BP after allowing her to rest.   Recommended continue current regimen and collaboration w/ cardiology.    Hyperlipidemia:  Controlled; current treatment: rosuvastatin 10 mg daily   Reviewed fill history; patient is up to date  Recommended to continue current regimen  Hx Kidney Stones, Urinary Incotinence: . Controlled per patient report; confirms that she increased sodium bicarbonate to TID administration after last urology appt. Notes that she picked up oxybutynin XL 10 mg daily, but decided that she doesn't feel the incontinence is "bad enough" to warrant treatment at this time. Notes that she will start taking if she feels incontience worsens.  . Encouraged to discuss concerns like this with urology at next appointment. Recommend to continue current regimen at this time.    Depression/Anxiety:  Improved per patient report; current treatment: sertraline 150 mg daily. Follows w/ psychiatry Dr. Mamie Nick and Garald Braver. Will transition to new psychiatrist per patient report, as Dr. Mamie Nick is leaving.   Continues to use breathing techniques to hepl with anxiety.   Encouraged continued  treatment at this time, along with psych collaboration.   Patient Goals/Self-Care Activities . Over the next 90 days, patient will:  - take medications as prescribed check glucose twice daily, document, and provide at future appointments check blood pressure daily, document, and provide at future appointments  Follow Up Plan: Telephone follow up appointment with care management team member scheduled for: ~ 10 weeks     Medication Assistance: None required.  Patient affirms current coverage meets needs. Full Medicare Extra Help  Follow Up:  Patient agrees to Care Plan and Follow-up.  Plan: Telephone follow up appointment with care management team member scheduled for:  ~ 10 weeks (PCP visit in ~ 2 weeks)  [name], PharmD, [place], [place] Clinical Pharmacist [employer] [facility] [employer] [phone]

## 2020-01-31 NOTE — Patient Instructions (Signed)
Visit Information  Goals Addressed              This Visit's Progress     Patient Stated   .  Medication Monitoring (pt-stated)        Patient Goals/Self-Care Activities . Over the next 90 days, patient will:  - take medications as prescribed check glucose twice daily, document, and provide at future appointments check blood pressure daily, document, and provide at future appointments         Patient verbalizes understanding of instructions provided today and agrees to view in [place].  Plan: Telephone follow up appointment with care management team member scheduled for:  ~ 10 weeks (PCP visit in ~ 2 weeks)  [name], PharmD, [place], [place] Clinical Pharmacist [employer] [facility] [employer] [phone]

## 2020-02-03 ENCOUNTER — Telehealth: Payer: Self-pay | Admitting: Internal Medicine

## 2020-02-03 ENCOUNTER — Ambulatory Visit: Payer: Self-pay | Admitting: *Deleted

## 2020-02-03 ENCOUNTER — Encounter: Payer: Self-pay | Admitting: *Deleted

## 2020-02-03 ENCOUNTER — Other Ambulatory Visit: Payer: Self-pay | Admitting: *Deleted

## 2020-02-03 DIAGNOSIS — I152 Hypertension secondary to endocrine disorders: Secondary | ICD-10-CM

## 2020-02-03 DIAGNOSIS — E1159 Type 2 diabetes mellitus with other circulatory complications: Secondary | ICD-10-CM

## 2020-02-03 DIAGNOSIS — E119 Type 2 diabetes mellitus without complications: Secondary | ICD-10-CM

## 2020-02-03 DIAGNOSIS — I509 Heart failure, unspecified: Secondary | ICD-10-CM

## 2020-02-03 NOTE — Telephone Encounter (Signed)
   Telephone encounter was:  Unsuccessful.  02/03/2020 Name: Nancy Barajas MRN: [id number] DOB: Jan 23, 1960  Unsuccessful outbound call made today to assist with:  Financial Difficulties related to dental needs.   Outreach Attempt:  1st Attempt  A HIPAA compliant voice message was left requesting a return call.  Instructed patient to call back at [phone].  [place], Care Management Phone: [phone] Email: [email or web address]

## 2020-02-03 NOTE — Patient Outreach (Signed)
[facility]) Care Management  02/03/2020  Nancy Barajas [birth year] [id number]   RN Health Coach Case Closure/Transition to CCM/Embedded  Referral Date:12/05/2019 Referral Source:Transfer from [place] Reason for Referral:Continued Disease Management Education Insurance:Medicare and Medicaid   Outreach Attempt:  Patient is being transitioned to Chronic Care Management Embedded Team for Case Management services.  Plan:  Will notify primary care provider of transition.  [place] Management  RN Telephonic Care Coordinator  [phone] [email or web address]

## 2020-02-03 NOTE — Patient Instructions (Addendum)
Visit Information  Management of Diabetes/CHF/HTN   Start Date: 12/11/2019  Expected End Date: 09/02/2020  This Visit's Progress: On track  Priority: Medium  Objective:  Lab Results  Component Value Date   HGBA1C 6.4 11/12/2019 .   Lab Results  Component Value Date   CREATININE 1.19 11/12/2019   CREATININE 1.21 (H) 05/14/2019   CREATININE 1.24 (H) 04/02/2019 .   Marland Kitchen No results found for: EGFR  Current Barriers:  . Difficulty obtaining or cannot afford medications as evidenced by not taking Trulicity for the last 3 weeks.  Patient reports she now has Trulicity and has restarted this medication this past Sunday (02/02/20).  Denies any abdominal issues after resuming medication.  Patient encouraged to contact Embedded Pharmacist [name] if issues arise prior to stopping medication . Social issues and knowledge deficits affecting self care management of chronic conditions: Diabetes, Hypertension, Heart Failure as patient reports diet is mainly controlled by foods she is able to afford and requires assistance from her daughter to purchase groceries  Case Manager Clinical Goal(s):  Marland Kitchen Collaboration with McLean-Scocuzza, Nino Glow, MD regarding development and update of comprehensive plan of care as evidenced by provider attestation and co-signature . Inter-disciplinary care team collaboration (see longitudinal plan of care) . Over the next 60 days, patient will demonstrate improved adherence to prescribed treatment plan for diabetes self care/management as evidenced by: compliance with taking Trulicity . Encourage lifestyle changes, such as increased intake of plant-based foods, stress reduction, consistent physical activity and medication compliance to prevent long-term complications and chronic disease.   . Encourage daily monitoring and recording of CBG, blood pressure, and weights . Discuss importance of adherence to ADA/ carb modified Heart Healthy diet . Discuss importance of adherence to  prescribed medication regimen  Case Manager Interventions:   General . Encourage regular dental care for treatment of periodontal disease   . Reviewed medications with patient and discussed importance of medication adherence . Discussed plans with patient for ongoing care management follow up and provided patient with direct contact information for care management team . Discussed and encouraged patient to continue to contact Medicaid Worker for assistance with medical bills and to work with Care Guide for community resources . Encouraged to continue to attend therapy/counseling sessions to help with depression . Discussed using deep breathing exercises when feeling anxious  Diabetes . Advised patient, providing education and rationale, to check cbg at least twice a day and record, calling primary care provider or CCM Embedded Pharmacist for findings outside established parameters.   . Congratulated patient on current Hgb A1C and encouraged to review what her goal A1C should be with provider HTN . Encouraged to continue to monitor blood pressures daily, and if blood pressures elevated to document note when taken and if prior to patient taking morning medications to recheck blood pressure about 2 hours after taking morning medications (reports sometimes blood pressures are elevated in the mornings prior to taking her medications but typically range 130-140/80's) CHF . Continue to monitor weights daily, first thing in the morning when she wakens after voiding and to notify provider for 3 pound weight gain overnight or 5 pounds within 1 week (weight this morning was 192 normal range is 191-195 pounds)   Patient Goals/Self-Care Activities Over the next 60 days, patient will:  General . Follow-up on any referrals for help I am given . Think ahead to make sure my need does not become an emergency . Contact Social Services to speak with your Medicaid Worker  to discuss re enrollment and assistance  with medical bills  . Contact provider offices to arrange payment arrangements for medical bills/copays . Attends all scheduled provider appointments Diabetes . Self administers oral medications as prescribed . Self administers injectable DM medication (Trulicity) as prescribed . Notify CCM Pharmacist for side effects of Trulicity prior to discontinuing medication . Take the blood sugar log to all doctor visits  . Checks blood sugars as prescribed and utilize hyper and hypoglycemia protocol as needed . Attempt to adhere to prescribed ADA/carb modified heart healthy low sodium diet HTN . Continue to monitor blood pressures daily, noting if blood pressures elevated if prior to taking medications and to recheck after taking morning medications CHF . Continue to monitor weights daily and keeping in log (denies lower extremity edema or shortness of breath) . Weigh self first thing in the mornings after voiding . Notify provider for increase weight 3 pounds overnight of 5 pounds within 5 days or lower extremity swelling  Follow Up Plan: The patient has been provided with contact information for the care management team and has been advised to call with any health related questions or concerns.  The care management team will reach out to the patient again over the next 30 days.    Patient Care Plan: General Plan of Care (Adult)  Problem Identified: Health Promotion or Disease Self-Management (General Plan of Care) Resolved 02/03/2020  Priority: Medium  Long-Range Goal: Self-Management Plan Developed Completed 02/03/2020  Task: Mutually Develop and Royce Macadamia Achievement of Patient Goals Completed 02/03/2020  Due Date: 07/02/2020  Care Management Activities:    - advance care planning facilitated - barriers to meeting goals identified - collaboration with team encouraged - health risks reviewed - readiness for change evaluated - reassurance provided - resources needed to meet goals identified -  self-reflection promoted - self-reliance encouraged - verbalization of feelings encouraged    Notes: Discussed [facility] Social Work referral for depression and community resources in the future if unable to manage depression with current treatment, encouraged contact to [employer] social worker to assist with medical bills and also encouraged patient to contact bill providers to arrange payment options  02/03/20-has received some assistance with recent dental bill and has arranged payment arranges for rest of bill , encouraged to continue to contact Medicaid worker for possible assistance        Ms. Siers was given information about Chronic Care Management services today including:  1. CCM service includes personalized support from designated clinical staff supervised by her physician, including individualized plan of care and coordination with other care providers 2. 24/7 contact phone numbers for assistance for urgent and routine care needs. 3. Service will only be billed when office clinical staff spend 20 minutes or more in a month to coordinate care. 4. Only one practitioner may furnish and bill the service in a calendar month. 5. The patient may stop CCM services at any time (effective at the end of the month) by phone call to the office staff. 6. The patient will be responsible for cost sharing (co-pay) of up to 20% of the service fee (after annual deductible is met).  Patient agreed to services and verbal consent obtained.   Patient verbalizes understanding of instructions provided today and agrees to view in [place].   The patient has been provided with contact information for the care management team and has been advised to call with any health related questions or concerns.  The care management team will reach out to  the patient again over the next 30 days.   Sanda Linger, RN

## 2020-02-03 NOTE — Chronic Care Management (AMB) (Signed)
Chronic Care Management   CCM RN Visit Note  02/03/2020 Name: Nancy Barajas MRN: [id number] DOB: Jun 02, 1960  Subjective: Nancy Barajas is a 60 y.o. year old female who is a primary care patient of McLean-Scocuzza, Nino Glow, MD. The care management team was consulted for assistance with disease management and care coordination needs.    Engaged with patient by telephone for initial visit in response to provider referral for case management and/or care coordination services.   Consent to Services:  The patient was given the following information about Chronic Care Management services today, agreed to services, and gave verbal consent: 1. CCM service includes personalized support from designated clinical staff supervised by the primary care provider, including individualized plan of care and coordination with other care providers 2. 24/7 contact phone numbers for assistance for urgent and routine care needs. 3. Service will only be billed when office clinical staff spend 20 minutes or more in a month to coordinate care. 4. Only one practitioner may furnish and bill the service in a calendar month. 5.The patient may stop CCM services at any time (effective at the end of the month) by phone call to the office staff. 6. The patient will be responsible for cost sharing (co-pay) of up to 20% of the service fee (after annual deductible is met). Patient agreed to services and consent obtained.  Patient agreed to services and verbal consent obtained.   Assessment: Review of patient past medical history, allergies, medications, health status, including review of consultants reports, laboratory and other test data, was performed as part of comprehensive evaluation and provision of chronic care management services.   SDOH (Social Determinants of Health) assessments and interventions performed:  SDOH Interventions   Flowsheet Row Most Recent Value  SDOH Interventions   Food Insecurity Interventions  Intervention Not Indicated  [daughter purchases food]       CCM Care Plan  Allergies  Allergen Reactions  . Penicillins Rash    Did it involve swelling of the face/tongue/throat, SOB, or low BP? No Did it involve sudden or severe rash/hives, skin peeling, or any reaction on the inside of your mouth or nose? No Did you need to seek medical attention at a hospital or doctor's office? No When did it last happen?Childhood If all above answers are "NO", may proceed with cephalosporin use.    Outpatient Encounter Medications as of 02/03/2020  Medication Sig Note  . carvedilol (COREG) 25 MG tablet Take 1 tablet (25 mg total) by mouth 2 (two) times daily with a meal.   . Cholecalciferol (VITAMIN D-3) 125 MCG (5000 UT) TABS Take 1 tablet by mouth daily. (Patient taking differently: Take 5,000 Units by mouth daily.)   . Dulaglutide (TRULICITY) 1.5 YK/9.9IP SOPN Inject 1.5 mg into the skin once a week.   Marland Kitchen FARXIGA 10 MG TABS tablet Take 10 mg by mouth daily.   . Multiple Vitamins-Minerals (CENTRUM SILVER 50+WOMEN PO) Take 1 tablet by mouth daily.   . ReliOn Ultra Thin Lancets MISC 1 Device by Does not apply route 4 (four) times daily -  before meals and at bedtime. relion brand   . rosuvastatin (CRESTOR) 10 MG tablet Take 1 tablet (10 mg total) by mouth every evening.   . sacubitril-valsartan (ENTRESTO) 97-103 MG Take 1 tablet by mouth 2 (two) times daily.   . sertraline (ZOLOFT) 100 MG tablet Take 1.5 tablets (150 mg total) by mouth daily.   . sodium bicarbonate 650 MG tablet Take 1 tablet (650 mg total) by  mouth 3 (three) times daily.   . sodium chloride (OCEAN) 0.65 % SOLN nasal spray Place 1 spray into both nostrils as needed for congestion.    Marland Kitchen spironolactone (ALDACTONE) 25 MG tablet Take 1 tablet (25 mg total) by mouth daily.   . blood glucose meter kit and supplies Dispense based on patient and insurance preference. Use up to four times daily as directed. (FOR ICD-10 E10.9, E11.9).    Marland Kitchen oxybutynin (DITROPAN-XL) 10 MG 24 hr tablet Take 1 tablet (10 mg total) by mouth daily. (Patient not taking: No sig reported) 02/03/2020: Reports not taking at this time   No facility-administered encounter medications on file as of 02/03/2020.    Patient Active Problem List   Diagnosis Date Noted  . Obstructive uropathy 08/01/2019  . Gallstones 07/28/2019  . Umbilical hernia 40/81/4481  . Hematuria 07/15/2019  . Hypertension associated with diabetes ([place]) 07/11/2019  . Vitamin D deficiency 07/11/2019  . Hiatal hernia 07/11/2019  . Obesity (BMI 30.0-34.9) 07/11/2019  . Heart failure with reduced ejection fraction ([place])   . GAD (generalized anxiety disorder) 04/06/2019  . Proteinuria 03/04/2019  . Aortic atherosclerosis ([place]) 03/04/2019  . Thyroid nodule 03/04/2019  . Kidney cysts 03/04/2019  . Hemangioma of liver 03/04/2019  . Sinusitis 03/04/2019  . Kidney stones   . CKD stage 3 secondary to diabetes ([place]) 08/22/2018  . Laryngeal spasm 05/07/2018  . Muscle tension dysphonia 05/07/2018  . Shortness of breath 01/22/2018  . Uncontrolled hypertension 01/22/2018  . Type 2 diabetes mellitus without complication, without long-term current use of insulin ([place]) 01/22/2018  . Mixed hyperlipidemia 01/22/2018  . Mild persistent asthma 01/22/2018    Conditions to be addressed/monitored:CHF, HTN and DMII  Care Plan : Diabetes/Heart Failure/Hypertension  Updates made by [name], RN since 02/03/2020 12:00 AM  Management of Diabetes/CHF/HTN   Priority: Medium  Long-Range Goal: Glycemic Management Optimized   Start Date: 12/11/2019  Expected End Date: 09/02/2020  This Visit's Progress: On track  Priority: Medium  Objective:  Lab Results  Component Value Date   HGBA1C 6.4 11/12/2019 .   Lab Results  Component Value Date   CREATININE 1.19 11/12/2019   CREATININE 1.21 (H) 05/14/2019   CREATININE 1.24 (H) 04/02/2019 .   Marland Kitchen No results found for: EGFR  Current Barriers:   . Difficulty obtaining or cannot afford medications as evidenced by not taking Trulicity for the last 3 weeks.  Patient reports she now has Trulicity and has restarted this medication this past Sunday (02/02/20).  Denies any abdominal issues after resuming medication.  Patient encouraged to contact Embedded Pharmacist [name] if issues arise prior to stopping medication . Social issues and knowledge deficits affecting self care management of chronic conditions: Diabetes, Hypertension, Heart Failure as patient reports diet is mainly controlled by foods she is able to afford and requires assistance from her daughter to purchase groceries  Case Manager Clinical Goal(s):  Marland Kitchen Collaboration with McLean-Scocuzza, Nino Glow, MD regarding development and update of comprehensive plan of care as evidenced by provider attestation and co-signature . Inter-disciplinary care team collaboration (see longitudinal plan of care) . Over the next 60 days, patient will demonstrate improved adherence to prescribed treatment plan for diabetes self care/management as evidenced by: compliance with taking Trulicity . Encourage lifestyle changes, such as increased intake of plant-based foods, stress reduction, consistent physical activity and medication compliance to prevent long-term complications and chronic disease.   . Encourage daily monitoring and recording of CBG, blood pressure, and weights .  Discuss importance of adherence to ADA/ carb modified Heart Healthy diet . Discuss importance of adherence to prescribed medication regimen  Case Manager Interventions:   General . Encourage regular dental care for treatment of periodontal disease   . Reviewed medications with patient and discussed importance of medication adherence . Discussed plans with patient for ongoing care management follow up and provided patient with direct contact information for care management team . Discussed and encouraged patient to continue to contact  Medicaid Worker for assistance with medical bills and to work with Care Guide for community resources . Encouraged to continue to attend therapy/counseling sessions to help with depression . Discussed using deep breathing exercises when feeling anxious  Diabetes . Advised patient, providing education and rationale, to check cbg at least twice a day and record, calling primary care provider or CCM Embedded Pharmacist for findings outside established parameters.   . Congratulated patient on current Hgb A1C and encouraged to review what her goal A1C should be with provider HTN . Encouraged to continue to monitor blood pressures daily, and if blood pressures elevated to document note when taken and if prior to patient taking morning medications to recheck blood pressure about 2 hours after taking morning medications (reports sometimes blood pressures are elevated in the mornings prior to taking her medications but typically range 130-140/80's) CHF . Continue to monitor weights daily, first thing in the morning when she wakens after voiding and to notify provider for 3 pound weight gain overnight or 5 pounds within 1 week (weight this morning was 192 normal range is 191-195 pounds)   Patient Goals/Self-Care Activities Over the next 60 days, patient will:  General . Follow-up on any referrals for help I am given . Think ahead to make sure my need does not become an emergency . Contact Social Services to speak with your Medicaid Worker to discuss re enrollment and assistance with medical bills  . Contact provider offices to arrange payment arrangements for medical bills/copays . Attends all scheduled provider appointments Diabetes . Self administers oral medications as prescribed . Self administers injectable DM medication (Trulicity) as prescribed . Notify CCM Pharmacist for side effects of Trulicity prior to discontinuing medication . Take the blood sugar log to all doctor visits  . Checks blood  sugars as prescribed and utilize hyper and hypoglycemia protocol as needed . Attempt to adhere to prescribed ADA/carb modified heart healthy low sodium diet HTN . Continue to monitor blood pressures daily, noting if blood pressures elevated if prior to taking medications and to recheck after taking morning medications CHF . Continue to monitor weights daily and keeping in log (denies lower extremity edema or shortness of breath) . Weigh self first thing in the mornings after voiding . Notify provider for increase weight 3 pounds overnight of 5 pounds within 5 days or lower extremity swelling  Follow Up Plan: The patient has been provided with contact information for the care management team and has been advised to call with any health related questions or concerns.  The care management team will reach out to the patient again over the next 30 days.     Care Plan : General Plan of Care (Adult)  Updates made by [name], RN since 02/03/2020 12:00 AM  Problem: Health Promotion or Disease Self-Management (General Plan of Care) Resolved 02/03/2020  Priority: Medium  Long-Range Goal: Self-Management Plan Developed Completed 02/03/2020  Task: Mutually Develop and Royce Macadamia Achievement of Patient Goals Completed 02/03/2020  Due Date: 07/02/2020  Care Management Activities:    - advance care planning facilitated - barriers to meeting goals identified - collaboration with team encouraged - health risks reviewed - readiness for change evaluated - reassurance provided - resources needed to meet goals identified - self-reflection promoted - self-reliance encouraged - verbalization of feelings encouraged    Notes: Discussed [facility] Social Work referral for depression and community resources in the future if unable to manage depression with current treatment, encouraged contact to [employer] social worker to assist with medical bills and also encouraged patient to contact bill providers to arrange  payment options  02/03/20-has received some assistance with recent dental bill and has arranged payment arranges for rest of bill , encouraged to continue to contact Medicaid worker for possible assistance      Plan:The patient has been provided with contact information for the care management team and has been advised to call with any health related questions or concerns.  and The care management team will reach out to the patient again over the next 30 days.   [place] [facility]  RN Telephonic Care Coordinator  [phone] [email or web address]

## 2020-02-11 ENCOUNTER — Telehealth: Payer: Self-pay

## 2020-02-11 DIAGNOSIS — E119 Type 2 diabetes mellitus without complications: Secondary | ICD-10-CM

## 2020-02-11 NOTE — Telephone Encounter (Signed)
Pt called and states that both [facility] locations in [place] are out of ReliOn Ultra Thin Lancets MISC. She would like to know where she can get more-she is out? Please advise

## 2020-02-11 NOTE — Telephone Encounter (Signed)
We also have free meters here  But if needed please send in strips/lancets/meter for 1-2 x per day checking thank you

## 2020-02-11 NOTE — Telephone Encounter (Signed)
The free meters are nice! But not sustainable if their insurance doesn't covers those particular strips/lancets.

## 2020-02-11 NOTE — Telephone Encounter (Signed)
Relion brand is only at [facility] (it's the store brand), so I would ask the [place] team when they expect a new order to arrive. Or she can see if they can see if other [facility] [place]) have any in stock.   Given her current level of control, she can limit testing to 1-2 times daily.   Alternatively, now that she has a different insurance plan, cost of a prescription version might be better covered. Arianna, do you want to fax in a generic meter + strips + lancets and have her check with the pharmacy to see what is covered by her insurance and if it's affordable?

## 2020-02-11 NOTE — Telephone Encounter (Signed)
Please advise, do you all know of anywhere else?

## 2020-02-12 MED ORDER — BLOOD GLUCOSE METER KIT: PACK | 0 refills | Status: AC

## 2020-02-12 NOTE — Telephone Encounter (Signed)
Patient informed and verbalized understanding.  She will call the [facility] in [place], [place] and [place].   Patient is agreeable to sending in new script and checking to see what meter is covered. Sent.

## 2020-02-12 NOTE — Addendum Note (Signed)
Addended by: Thressa Sheller on: 02/12/2020 01:43 PM   Modules accepted: Orders

## 2020-02-13 ENCOUNTER — Telehealth: Payer: Self-pay | Admitting: Internal Medicine

## 2020-02-13 NOTE — Telephone Encounter (Signed)
   Telephone encounter was:  Unsuccessful.  02/13/2020 Name: Jamesyn Moorefield MRN: [id number] DOB: Jan 26, 1960  Unsuccessful outbound call made today to assist with:  Financial Difficulties related to dental resources  Outreach Attempt:  2nd Attempt  A HIPAA compliant voice message was left requesting a return call.  Instructed patient to call back at [phone].  [place], Care Management Phone: [phone] Email: [email or web address]

## 2020-02-14 ENCOUNTER — Ambulatory Visit (INDEPENDENT_AMBULATORY_CARE_PROVIDER_SITE_OTHER): Payer: Medicare HMO | Admitting: Psychology

## 2020-02-14 DIAGNOSIS — F418 Other specified anxiety disorders: Secondary | ICD-10-CM

## 2020-02-14 DIAGNOSIS — R69 Illness, unspecified: Secondary | ICD-10-CM | POA: Diagnosis not present

## 2020-02-19 ENCOUNTER — Telehealth: Payer: Self-pay | Admitting: Internal Medicine

## 2020-02-19 ENCOUNTER — Other Ambulatory Visit: Payer: Self-pay

## 2020-02-19 ENCOUNTER — Encounter: Payer: Self-pay | Admitting: Internal Medicine

## 2020-02-19 ENCOUNTER — Ambulatory Visit (INDEPENDENT_AMBULATORY_CARE_PROVIDER_SITE_OTHER): Payer: Medicare HMO | Admitting: Internal Medicine

## 2020-02-19 VITALS — BP 130/82 | HR 74 | Temp 98.2°F | Ht 71.0 in | Wt 191.0 lb

## 2020-02-19 DIAGNOSIS — R69 Illness, unspecified: Secondary | ICD-10-CM | POA: Diagnosis not present

## 2020-02-19 DIAGNOSIS — I152 Hypertension secondary to endocrine disorders: Secondary | ICD-10-CM | POA: Diagnosis not present

## 2020-02-19 DIAGNOSIS — H029 Unspecified disorder of eyelid: Secondary | ICD-10-CM

## 2020-02-19 DIAGNOSIS — E1159 Type 2 diabetes mellitus with other circulatory complications: Secondary | ICD-10-CM

## 2020-02-19 DIAGNOSIS — F419 Anxiety disorder, unspecified: Secondary | ICD-10-CM

## 2020-02-19 DIAGNOSIS — R498 Other voice and resonance disorders: Secondary | ICD-10-CM

## 2020-02-19 DIAGNOSIS — R490 Dysphonia: Secondary | ICD-10-CM

## 2020-02-19 DIAGNOSIS — F444 Conversion disorder with motor symptom or deficit: Secondary | ICD-10-CM

## 2020-02-19 DIAGNOSIS — R0602 Shortness of breath: Secondary | ICD-10-CM

## 2020-02-19 DIAGNOSIS — F32A Depression, unspecified: Secondary | ICD-10-CM | POA: Insufficient documentation

## 2020-02-19 LAB — HEMOGLOBIN A1C: Hgb A1c MFr Bld: 6.2 % (ref 4.6–6.5)

## 2020-02-19 LAB — CBC WITH DIFFERENTIAL/PLATELET
Basophils Absolute: 0 10*3/uL (ref 0.0–0.1)
Basophils Relative: 0.9 % (ref 0.0–3.0)
Eosinophils Absolute: 0 10*3/uL (ref 0.0–0.7)
Eosinophils Relative: 1 % (ref 0.0–5.0)
HCT: 42.6 % (ref 36.0–46.0)
Hemoglobin: 13.5 g/dL (ref 12.0–15.0)
Lymphocytes Relative: 31.7 % (ref 12.0–46.0)
Lymphs Abs: 1.3 10*3/uL (ref 0.7–4.0)
MCHC: 31.7 g/dL (ref 30.0–36.0)
MCV: 74.2 fl — ABNORMAL LOW (ref 78.0–100.0)
Monocytes Absolute: 0.3 10*3/uL (ref 0.1–1.0)
Monocytes Relative: 7.2 % (ref 3.0–12.0)
Neutro Abs: 2.5 10*3/uL (ref 1.4–7.7)
Neutrophils Relative %: 59.2 % (ref 43.0–77.0)
Platelets: 262 10*3/uL (ref 150.0–400.0)
RBC: 5.74 Mil/uL — ABNORMAL HIGH (ref 3.87–5.11)
RDW: 16.4 % — ABNORMAL HIGH (ref 11.5–15.5)
WBC: 4.3 10*3/uL (ref 4.0–10.5)

## 2020-02-19 LAB — LIPID PANEL
Cholesterol: 209 mg/dL — ABNORMAL HIGH (ref 0–200)
HDL: 92.1 mg/dL (ref >39.00)
LDL Cholesterol: 100 mg/dL — ABNORMAL HIGH (ref 0–99)
NonHDL: 117.31
Total CHOL/HDL Ratio: 2
Triglycerides: 89 mg/dL (ref 0.0–149.0)
VLDL: 17.8 mg/dL (ref 0.0–40.0)

## 2020-02-19 LAB — COMPREHENSIVE METABOLIC PANEL
ALT: 13 U/L (ref 0–35)
AST: 15 U/L (ref 0–37)
Albumin: 4.2 g/dL (ref 3.5–5.2)
Alkaline Phosphatase: 57 U/L (ref 39–117)
BUN: 30 mg/dL — ABNORMAL HIGH (ref 6–23)
CO2: 30 mEq/L (ref 19–32)
Calcium: 9.6 mg/dL (ref 8.4–10.5)
Chloride: 103 mEq/L (ref 96–112)
Creatinine, Ser: 1.32 mg/dL — ABNORMAL HIGH (ref 0.40–1.20)
GFR: 43.95 mL/min — ABNORMAL LOW (ref >60.00)
Glucose, Bld: 93 mg/dL (ref 70–99)
Potassium: 4.3 mEq/L (ref 3.5–5.1)
Sodium: 142 mEq/L (ref 135–145)
Total Bilirubin: 0.8 mg/dL (ref 0.2–1.2)
Total Protein: 7.3 g/dL (ref 6.0–8.3)

## 2020-02-19 MED ORDER — DOXYCYCLINE HYCLATE 100 MG PO TABS: 100.0000 mg | ORAL_TABLET | Freq: Two times a day (BID) | ORAL | 0 refills | Status: DC

## 2020-02-19 MED ORDER — LORAZEPAM 0.5 MG PO TABS: 0.2500 mg | ORAL_TABLET | Freq: Every day | ORAL | 5 refills | Status: DC | PRN

## 2020-02-19 MED ORDER — LANCETS MISC: 1.0000 | Freq: Three times a day (TID) | 3 refills | Status: DC

## 2020-02-19 NOTE — Patient Instructions (Signed)

## 2020-02-19 NOTE — Progress Notes (Addendum)
Chief Complaint  Patient presents with  . Follow-up  . Breathing Problem   F/u  1. Vocal cord dysfunction gasping worse ENT seen Dr. Patrice Paradise at [facility] dx functional voice d/o, consider botox and SLP which she did and not helping denies GERD sxs she does have anxiety seeing osman and psych with [facility] and p[facility] 01/31/18 negative  She is gasping for air more 2. Cyst vs stye right eyelid x 2 weeks was painful not so much now will refer [facility]  Nothing tried   Review of Systems  Constitutional: Negative for weight loss.  HENT: Negative for hearing loss.   Eyes: Negative for blurred vision.  Respiratory: Positive for shortness of breath.   Cardiovascular: Negative for chest pain.  Skin: Negative for rash.  Psychiatric/Behavioral: The patient is nervous/anxious.    Past Medical History:  Diagnosis Date  . Anxiety   . Asthma   . Depression   . Diabetes mellitus without complication ([place])   . History of kidney stones   . Hypertension   . Kidney stones   . Vertigo    when laying flat  . Vocal cord dysfunction    makes it hard for patient to talk and breathe   Past Surgical History:  Procedure Laterality Date  . CARDIAC CATHETERIZATION    . CYSTOSCOPY W/ RETROGRADES Right 09/27/2019   Procedure: CYSTOSCOPY WITH RETROGRADE PYELOGRAM;  Surgeon: Billey Co, MD;  Location: [facility] ORS;  Service: Urology;  Laterality: Right;  . CYSTOSCOPY/URETEROSCOPY/HOLMIUM LASER/STENT PLACEMENT Left 09/27/2019   Procedure: CYSTOSCOPY/URETEROSCOPY/HOLMIUM LASER/STENT PLACEMENT;  Surgeon: Billey Co, MD;  Location: [facility] ORS;  Service: Urology;  Laterality: Left;  . LITHOTRIPSY    . RIGHT/LEFT HEART CATH AND CORONARY ANGIOGRAPHY Bilateral 04/08/2019   Procedure: RIGHT/LEFT HEART CATH AND CORONARY ANGIOGRAPHY;  Surgeon: Wellington Hampshire, MD;  Location: [place] [facility];  Service: Cardiovascular;  Laterality: Bilateral;   Family History  Problem Relation Age of Onset  . Hypertension Mother   .  Diabetes Mother   . Hypertension Father   . Diabetes Father    Social History   Socioeconomic History  . Marital status: Divorced    Spouse name: Not on file  . Number of children: 2  . Years of education: Not on file  . Highest education level: 9th grade  Occupational History  . Not on file  Tobacco Use  . Smoking status: Former Smoker    Packs/day: 1.00    Years: 10.00    Pack years: 10.00    Quit date: 01/03/1998    Years since quitting: 22.1  . Smokeless tobacco: Never Used  Vaping Use  . Vaping Use: Never used  Substance and Sexual Activity  . Alcohol use: Not Currently  . Drug use: Not on file    Comment: years ago  . Sexual activity: Not Currently  Other Topics Concern  . Not on file  Social History Narrative   Lives at home    2 daughters as of 04/04/19 85 y.o daughter lives with her (she works)   Patient is on disability.   Social Determinants of Health   Financial Resource Strain: Medium Risk  . Difficulty of Paying Living Expenses: Somewhat hard  Food Insecurity: Food Insecurity Present  . Worried About Charity fundraiser in the Last Year: Sometimes true  . Ran Out of Food in the Last Year: Sometimes true  Transportation Needs: No Transportation Needs  . Lack of Transportation (Medical): No  . Lack of  Transportation (Non-Medical): No  Physical Activity: Not on file  Stress: No Stress Concern Present  . Feeling of Stress : Only a little  Social Connections: Not on file  Intimate Partner Violence: Not At Risk  . Fear of Current or Ex-Partner: No  . Emotionally Abused: No  . Physically Abused: No  . Sexually Abused: No   Current Meds  Medication Sig  . blood glucose meter kit and supplies Dispense based on patient and insurance preference. Use twice daily as directed. (FOR ICD-10 E10.9, E11.9).  . carvedilol (COREG) 25 MG tablet Take 1 tablet (25 mg total) by mouth 2 (two) times daily with a meal.  . Cholecalciferol (VITAMIN D-3) 125 MCG (5000 UT) TABS  Take 1 tablet by mouth daily. (Patient taking differently: Take 5,000 Units by mouth daily.)  . doxycycline (VIBRA-TABS) 100 MG tablet Take 1 tablet (100 mg total) by mouth 2 (two) times daily. With food  . Dulaglutide (TRULICITY) 1.5 BH/4.1PF SOPN Inject 1.5 mg into the skin once a week.  Marland Kitchen FARXIGA 10 MG TABS tablet Take 10 mg by mouth daily.  . Lancets MISC 1 Device by Does not apply route in the morning, at noon, and at bedtime. Dispense for onetouch ultra 2 machine ONLY  . LORazepam (ATIVAN) 0.5 MG tablet Take 0.5-1 tablets (0.25-0.5 mg total) by mouth daily as needed for anxiety.  . Multiple Vitamins-Minerals (CENTRUM SILVER 50+WOMEN PO) Take 1 tablet by mouth daily.  . rosuvastatin (CRESTOR) 10 MG tablet Take 1 tablet (10 mg total) by mouth every evening.  . sacubitril-valsartan (ENTRESTO) 97-103 MG Take 1 tablet by mouth 2 (two) times daily.  . sertraline (ZOLOFT) 100 MG tablet Take 1.5 tablets (150 mg total) by mouth daily.  . sodium bicarbonate 650 MG tablet Take 1 tablet (650 mg total) by mouth 3 (three) times daily.  . sodium chloride (OCEAN) 0.65 % SOLN nasal spray Place 1 spray into both nostrils as needed for congestion.   Marland Kitchen spironolactone (ALDACTONE) 25 MG tablet Take 1 tablet (25 mg total) by mouth daily.  . [DISCONTINUED] ReliOn Ultra Thin Lancets MISC 1 Device by Does not apply route 4 (four) times daily -  before meals and at bedtime. relion brand   Allergies  Allergen Reactions  . Penicillins Rash    Did it involve swelling of the face/tongue/throat, SOB, or low BP? No Did it involve sudden or severe rash/hives, skin peeling, or any reaction on the inside of your mouth or nose? No Did you need to seek medical attention at a hospital or doctor's office? No When did it last happen?Childhood If all above answers are "NO", may proceed with cephalosporin use.   Recent Results (from the past 2160 hour(s))  Urinalysis, Complete     Status: Abnormal   Collection Time:  01/01/20  9:00 AM  Result Value Ref Range   Specific Gravity, UA 1.025 1.005 - 1.030   pH, UA 5.0 5.0 - 7.5   Color, UA Yellow Yellow   Appearance Ur Cloudy (A) Clear   Leukocytes,UA Negative Negative   Protein,UA Negative Negative/Trace   Glucose, UA 3+ (A) Negative   Ketones, UA Negative Negative   RBC, UA 2+ (A) Negative   Bilirubin, UA Negative Negative   Urobilinogen, Ur 0.2 0.2 - 1.0 mg/dL   Nitrite, UA Negative Negative   Microscopic Examination See below:   Microscopic Examination     Status: Abnormal   Collection Time: 01/01/20  9:00 AM   Urine  Result Value  Ref Range   WBC, UA 6-10 (A) 0 - 5 /hpf   RBC 3-10 (A) 0 - 2 /hpf   Epithelial Cells (non renal) >10 (A) 0 - 10 /hpf   Casts Present (A) None seen /lpf   Cast Type Hyaline casts N/A   Bacteria, UA Moderate (A) None seen/Few  HM DIABETES EYE EXAM     Status: Abnormal   Collection Time: 01/14/20 12:00 AM  Result Value Ref Range   HM Diabetic Eye Exam Retinopathy (A) No Retinopathy    Comment: DR background no tx needed 01/14/20 AE   Objective  Body mass index is 26.64 kg/m. Wt Readings from Last 3 Encounters:  02/19/20 191 lb (86.6 kg)  01/16/20 200 lb (90.7 kg)  01/01/20 200 lb (90.7 kg)   Temp Readings from Last 3 Encounters:  02/19/20 98.2 F (36.8 C) (Oral)  11/12/19 98.1 F (36.7 C) (Oral)  09/27/19 (!) 97.4 F (36.3 C)   BP Readings from Last 3 Encounters:  02/19/20 130/82  01/01/20 (!) 161/86  11/20/19 (!) 89/53   Pulse Readings from Last 3 Encounters:  02/19/20 74  01/01/20 72  11/20/19 94    Physical Exam Vitals and nursing note reviewed.  Constitutional:      Appearance: Normal appearance. She is well-developed and well-groomed.  HENT:     Head: Normocephalic and atraumatic.  Eyes:     Conjunctiva/sclera: Conjunctivae normal.     Pupils: Pupils are equal, round, and reactive to light.   Cardiovascular:     Rate and Rhythm: Normal rate and regular rhythm.     Heart sounds:  Normal heart sounds. No murmur heard.   Pulmonary:     Effort: Pulmonary effort is normal.     Breath sounds: Normal breath sounds.  Skin:    General: Skin is warm and dry.  Neurological:     General: No focal deficit present.     Mental Status: She is alert and oriented to person, place, and time. Mental status is at baseline.     Gait: Gait normal.  Psychiatric:        Attention and Perception: Attention and perception normal.        Mood and Affect: Mood and affect normal.        Speech: Speech normal.        Behavior: Behavior normal. Behavior is cooperative.        Thought Content: Thought content normal.        Cognition and Memory: Cognition and memory normal.        Judgment: Judgment normal.     Assessment  Plan  Lesion of right eyelid - Plan: doxycycline (VIBRA-TABS) 100 MG tablet, Ambulatory referral to Ophthalmology AE  Anxiety - Plan: LORazepam (ATIVAN) 0.25-0.5 MG tablet qd prn Functional voice disorder vs muscle tension dystonia with SOB sensation- Plan: LORazepam (ATIVAN) 0.5 MG tablet Sent note Dr. Patrice Paradise ENT Consider pulm in future  Pt to my chart in 2 weeks  F/u therapy osman and psych [place] in [place]  Hypertension associated with diabetes ([place]) - Plan: Lancets MISC, Comprehensive metabolic panel, Lipid panel, Hemoglobin A1c, CBC with Differential/Platelet  crestor 10 mg qd,  entresto 97-103 bid Spironolactone 25 qd Coreg 25 mg bid trulicity 1.5 weekly   HM Flu utd given utd  Tdap and shingrixconsider pna 23 vaccine utd  covid2/2 had utdbooster 01/13/20 (3/3)  mammo orderedcall to schedule Pap 02/05/2018 negative  Colonoscopy never haddue to anxietywill do cologaurdordered +07/29/19 + Referred  colonoscopy check on this referral since 12/02/19 they have not called pt   Urology Dr. Diamantina [place] had kidney stone surgery 10/09/19 and stent removal  10/16/19 [facility] thyroid bx 10/16/19 bx negative [place] endocrine    Provider: Dr. Olivia Mackie McLean-Scocuzza-Internal  Medicine

## 2020-02-19 NOTE — Telephone Encounter (Signed)
Ok noted  

## 2020-02-19 NOTE — Telephone Encounter (Signed)
Rejection Reason - Other - does not need an appt" Neta Ehlers said on Feb 19, 2020 12:25 PM  "pt talked to nurse. nurse said to try warm compresses. if problem does not get better she will callback in a couple of weeks. " Neta Ehlers said on Feb 19, 2020 12:25 PM  [facility]

## 2020-02-24 ENCOUNTER — Ambulatory Visit (INDEPENDENT_AMBULATORY_CARE_PROVIDER_SITE_OTHER): Payer: Medicare HMO | Admitting: *Deleted

## 2020-02-24 DIAGNOSIS — I5020 Unspecified systolic (congestive) heart failure: Secondary | ICD-10-CM | POA: Diagnosis not present

## 2020-02-24 DIAGNOSIS — E119 Type 2 diabetes mellitus without complications: Secondary | ICD-10-CM | POA: Diagnosis not present

## 2020-02-24 DIAGNOSIS — I10 Essential (primary) hypertension: Secondary | ICD-10-CM

## 2020-02-24 NOTE — Patient Instructions (Signed)
Visit Information  PATIENT GOALS: Goals Addressed            This Visit's Progress   . (CCM RN) Find Help in My Community   On track    Timeframe:  Short-Term Goal Priority:  Medium Start Date:  12/11/2019                           Expected End Date:  04/02/2020         Follow Up Date:  03/23/20                 . Return call to [place] [phone] for other community resources . Continue to work with Clinical research associate for making arrangements for dental bill . Continue to use warm compresses on eye to help with sty and pain   Why is this important?    Knowing how and where to find help for yourself or family in your neighborhood and community is an important skill.   You will want to take some steps to learn how.    Notes:     . COMPLETED: (CCM RN) Make and Keep All Appointments   On track    Timeframe:  Long-Range Goal Priority:  Medium Start Date:  10/07/2019                       Expected End Date:  07/02/2020         Follow Up Date:  02/24/20                - call to cancel if needed - keep a calendar with appointment dates -arrange transportation if unable to drive self to appointment -attend scheduled appointment with primary care provider on 02/19/20   Why is this important?   Part of staying healthy is seeing the doctor for follow-up care.  If you forget your appointments, there are some things you can do to stay on track.    Notes:     Marland Kitchen (CCM RN) Monitor and Manage My Blood Sugar, Blood Pressure, and Daily Weights   On track    Timeframe:  Long-Range Goal Priority:  Medium Start Date:  10/07/2019                           Expected End Date:  07/02/20         Follow Up:  03/23/20              - check blood sugar at least twice a day (before meals or 2 hours post eating) - check blood sugar if I feel it is too high or too low - enter blood sugar readings and medication into daily log and take log to provider appointments -continue to monitor weights  daily and keeping in log -weigh self first thing in the mornings after voiding -notify provider for increase weight 3 pounds overnight of 5 pounds within 5 days or lower extremity swelling -continue to monitor blood pressures daily, noting if blood pressures elevated if prior to taking medications and to recheck after taking morning medications   Why is this important?   Checking your blood sugar at home helps to keep it from getting very high or very low.  Writing the results in a diary or log helps the doctor know how to care for you.  Your blood sugar log should have the time,  date and the results.  Also, write down the amount of insulin or other medicine that you take.  Other information, like what you ate, exercise done and how you were feeling, will also be helpful.     Notes:      Marland Kitchen (CCM RN) Set My Target A1C-Diabetes Type 2   On track    Timeframe:  Long-Range Goal Priority:  Medium Start Date:   12/11/2019                          Expected End Date:  07/02/2020      Follow Up Date:  03/23/20                   - set target A1C; current A1C decreased to 6.2(02/19/20) -discuss with PCP goal Hgb A1C    Why is this important?    Your target A1C is decided together by you and your doctor.   It is based on several things like your age and other health issues.    Notes:        Patient verbalizes understanding of instructions provided today and agrees to view in [place].   The care management team will reach out to the patient again over the next 30 business days.   Hubert Azure RN, MSN RN Care Management Coordinator [place] [phone] [email or web address]

## 2020-02-24 NOTE — Chronic Care Management (AMB) (Signed)
Chronic Care Management   CCM RN Visit Note  02/24/2020 Name: Nancy Barajas MRN: [id number] DOB: 08/08/1960  Subjective: Nancy Barajas is a 59 y.o. year old female who is a primary care patient of McLean-Scocuzza, Tracy N, MD. The care management team was consulted for assistance with disease management and care coordination needs.    Engaged with patient by telephone for follow up visit in response to provider referral for case management and/or care coordination services.   Consent to Services:  The patient was given information about Chronic Care Management services, agreed to services, and gave verbal consent prior to initiation of services.  Please see initial visit note for detailed documentation.   Patient agreed to services and verbal consent obtained.   Assessment: Review of patient past medical history, allergies, medications, health status, including review of consultants reports, laboratory and other test data, was performed as part of comprehensive evaluation and provision of chronic care management services.   SDOH (Social Determinants of Health) assessments and interventions performed:    CCM Care Plan  Allergies  Allergen Reactions  . Penicillins Rash    Did it involve swelling of the face/tongue/throat, SOB, or low BP? No Did it involve sudden or severe rash/hives, skin peeling, or any reaction on the inside of your mouth or nose? No Did you need to seek medical attention at a hospital or doctor's office? No When did it last happen?Childhood If all above answers are "NO", may proceed with cephalosporin use.    Outpatient Encounter Medications as of 02/24/2020  Medication Sig Note  . carvedilol (COREG) 25 MG tablet Take 1 tablet (25 mg total) by mouth 2 (two) times daily with a meal.   . Cholecalciferol (VITAMIN D-3) 125 MCG (5000 UT) TABS Take 1 tablet by mouth daily. (Patient taking differently: Take 5,000 Units by mouth daily.)   . Dulaglutide  (TRULICITY) 1.5 MG/0.5ML SOPN Inject 1.5 mg into the skin once a week.   . FARXIGA 10 MG TABS tablet Take 10 mg by mouth daily.   . LORazepam (ATIVAN) 0.5 MG tablet Take 0.5-1 tablets (0.25-0.5 mg total) by mouth daily as needed for anxiety.   . Multiple Vitamins-Minerals (CENTRUM SILVER 50+WOMEN PO) Take 1 tablet by mouth daily.   . rosuvastatin (CRESTOR) 10 MG tablet Take 1 tablet (10 mg total) by mouth every evening.   . sacubitril-valsartan (ENTRESTO) 97-103 MG Take 1 tablet by mouth 2 (two) times daily.   . sertraline (ZOLOFT) 100 MG tablet Take 1.5 tablets (150 mg total) by mouth daily.   . sodium bicarbonate 650 MG tablet Take 1 tablet (650 mg total) by mouth 3 (three) times daily.   . sodium chloride (OCEAN) 0.65 % SOLN nasal spray Place 1 spray into both nostrils as needed for congestion.    . spironolactone (ALDACTONE) 25 MG tablet Take 1 tablet (25 mg total) by mouth daily.   . blood glucose meter kit and supplies Dispense based on patient and insurance preference. Use twice daily as directed. (FOR ICD-10 E10.9, E11.9).   . doxycycline (VIBRA-TABS) 100 MG tablet Take 1 tablet (100 mg total) by mouth 2 (two) times daily. With food 02/24/2020: Have not started yet  . Lancets MISC 1 Device by Does not apply route in the morning, at noon, and at bedtime. Dispense for onetouch ultra 2 machine ONLY    No facility-administered encounter medications on file as of 02/24/2020.    Patient Active Problem List   Diagnosis Date Noted  . Anxiety 02/19/2020  .   Obstructive uropathy 08/01/2019  . Gallstones 07/28/2019  . Umbilical hernia 07/28/2019  . Hematuria 07/15/2019  . Hypertension associated with diabetes (HCC) 07/11/2019  . Vitamin D deficiency 07/11/2019  . Hiatal hernia 07/11/2019  . Obesity (BMI 30.0-34.9) 07/11/2019  . Heart failure with reduced ejection fraction (HCC)   . GAD (generalized anxiety disorder) 04/06/2019  . Proteinuria 03/04/2019  . Aortic atherosclerosis (HCC)  03/04/2019  . Thyroid nodule 03/04/2019  . Kidney cysts 03/04/2019  . Hemangioma of liver 03/04/2019  . Sinusitis 03/04/2019  . Kidney stones   . CKD stage 3 secondary to diabetes (HCC) 08/22/2018  . Laryngeal spasm 05/07/2018  . Muscle tension dysphonia 05/07/2018  . Shortness of breath 01/22/2018  . Uncontrolled hypertension 01/22/2018  . Type 2 diabetes mellitus without complication, without long-term current use of insulin (HCC) 01/22/2018  . Mixed hyperlipidemia 01/22/2018  . Mild persistent asthma 01/22/2018    Conditions to be addressed/monitored:CHF, HTN and DMII  Care Plan : Diabetes/Heart Failure/Hypertension  Updates made by Tarpley, Farrah D, RN since 02/24/2020 12:00 AM  Problem: Management of Diabetes/CHF/HTN   Priority: Medium  Long-Range Goal: Glycemic Management Optimized   Start Date: 12/11/2019  Expected End Date: 09/02/2020  This Visit's Progress: On track  Recent Progress: On track  Priority: Medium  Objective:  Lab Results  Component Value Date   HGBA1C 6.2 02/19/2020 .   Lab Results  Component Value Date   CREATININE 1.32 (H) 02/19/2020   CREATININE 1.19 11/12/2019   CREATININE 1.21 (H) 05/14/2019   Current Barriers:  . Knowledge deficit related to self care management of chronic medical conditions as evidenced by patient admitting being unsure of when to take prescribed medications.  Patient prescribed antibiotic for sty to eye on 02/19/20; states she has not started the medication due to not being sure when to take it.  States her pharmacy told her to take it 2 hours before or after taking her regular medications.  Patient also reporting she is a little apprehensive about taking prescribed medication for anxiety (ativan) because she has heard it is addictive. . Social issues and knowledge deficits affecting self care management of chronic conditions: Diabetes, Hypertension, Heart Failure as patient reports diet is mainly controlled by foods she is able to  afford and requires assistance from her daughter to purchase groceries Case Manager Clinical Goal(s):  . Collaboration with McLean-Scocuzza, Tracy N, MD regarding development and update of comprehensive plan of care as evidenced by provider attestation and co-signature . Inter-disciplinary care team collaboration (see longitudinal plan of care) . Over the next 60 days, patient will demonstrate improved adherence to prescribed treatment plan for diabetes self care/management as evidenced by: compliance with taking Trulicity-states she has been taking this medication as prescribed . Encourage lifestyle changes, such as increased intake of plant-based foods, stress reduction, consistent physical activity and medication compliance to prevent long-term complications and chronic disease.   . Encourage daily monitoring and recording of CBG, blood pressure, and weights . Discuss importance of adherence to ADA/ carb modified Heart Healthy diet . Discuss importance of adherence to prescribed medication regimen Case Manager Interventions:   General . Reviewed medications with patient and discussed importance of medication adherence; dicussed importance of taking antibiotics as prescribed and timing of taking antibiotics per pharmacy recommendations; also discussed taking prescribed anxiety medication to see if this will help with shortness of breath and anxiety . Discussed plans with patient for ongoing care management follow up and provided patient with direct contact information for   care management team . Discussed and encouraged patient to continue to return call to Care Guide for community resources . Encouraged to continue to attend therapy/counseling sessions to help with depression . Discussed using deep breathing exercises when feeling anxious . Encouraged to continue with warm compresses to eye as instructed by eye provider  Diabetes . Advised patient, providing education and rationale, to check cbg  at least twice a day and record, calling primary care provider or CCM Embedded Pharmacist for findings outside established parameters.   . Congratulated patient on current decrease Hgb A1C and encouraged to review what her goal A1C should be with provider . Reviewed current glucose readings (80-110's; 112 this morning) HTN . Encouraged to continue to monitor blood pressures daily, and if blood pressures elevated to document note when taken and if prior to patient taking morning medications to recheck blood pressure about 2 hours after taking morning medications (reports sometimes blood pressures are elevated in the mornings prior to taking her medications but typically range 130-140/70-80's) This mornings blood pressure was 124/80 CHF . Continue to monitor weights daily, first thing in the morning when she wakens after voiding and to notify provider for 3 pound weight gain overnight or 5 pounds within 1 week (weight this morning was 192 normal range is 191-195 pounds) Patient Goals/Self-Care Activities Over the next 60 days, patient will:  General . Return call to CCM Care Guide [phone] for other community resources . Continue to work with dental resource for making arrangements for dental bill . Continue to use warm compresses on eye to help with sty and pain Diabetes . check blood sugar at least twice a day (before meals or 2 hours post eating) . check blood sugar if I feel it is too high or too low . enter blood sugar readings and medication into daily log and take log to provider appointments . set target A1C; current A1C decreased to 6.2(02/19/20) . discuss with PCP goal Hgb A1C  HTN . Continue to monitor blood pressures daily, noting if blood pressures elevated if prior to taking medications and to recheck after taking morning medications CHF . Continue to monitor weights daily and keeping in log (denies lower extremity edema or shortness of breath) . Weigh self first thing in the  mornings after voiding . Notify provider for increase weight 3 pounds overnight of 5 pounds within 5 days or lower extremity swelling Follow Up Plan: The care management team will reach out to the patient again over the next 30 days.     Plan:The care management team will reach out to the patient again over the next 30 business days.  Farrah Tarpley RN, MSN RN Care Management Coordinator [facility] [phone] [email or web address]         

## 2020-02-25 ENCOUNTER — Telehealth: Payer: Self-pay | Admitting: Internal Medicine

## 2020-02-25 NOTE — Telephone Encounter (Signed)
   Telephone encounter was:  Unsuccessful.  02/25/2020 Name: Nancy Barajas MRN: [id number] DOB: Mar 26, 1960  Unsuccessful outbound call made today to assist with:  dental needs  Outreach Attempt:  3rd Attempt.  Referral closed unable to contact patient.  A HIPAA compliant voice message was left requesting a return call.  Instructed patient to call back at [phone]. I did send the patient an email with a listing of places she can utilize for her dental needs. Pt hasn't answered after multiple times. She did call and leave a message; when I called back there was no answer. If patient still needs help, please place another referral in the system. Thank you.   [place], Care Management Phone: [phone] Email: [email or web address]

## 2020-03-02 ENCOUNTER — Ambulatory Visit (INDEPENDENT_AMBULATORY_CARE_PROVIDER_SITE_OTHER): Payer: Medicare HMO | Admitting: Psychology

## 2020-03-02 DIAGNOSIS — F418 Other specified anxiety disorders: Secondary | ICD-10-CM

## 2020-03-02 DIAGNOSIS — R69 Illness, unspecified: Secondary | ICD-10-CM | POA: Diagnosis not present

## 2020-03-03 ENCOUNTER — Ambulatory Visit: Payer: Self-pay | Admitting: *Deleted

## 2020-03-03 ENCOUNTER — Encounter: Payer: Self-pay | Admitting: Internal Medicine

## 2020-03-06 ENCOUNTER — Other Ambulatory Visit: Payer: Self-pay | Admitting: Internal Medicine

## 2020-03-06 DIAGNOSIS — B373 Candidiasis of vulva and vagina: Secondary | ICD-10-CM

## 2020-03-06 DIAGNOSIS — B3731 Acute candidiasis of vulva and vagina: Secondary | ICD-10-CM

## 2020-03-06 MED ORDER — FLUCONAZOLE 150 MG PO TABS: 150.0000 mg | ORAL_TABLET | Freq: Once | ORAL | 0 refills | Status: AC

## 2020-03-06 NOTE — Addendum Note (Signed)
Addended by: Orland Mustard on: 03/06/2020 06:32 PM   Modules accepted: Orders

## 2020-03-16 ENCOUNTER — Ambulatory Visit: Payer: Medicare HMO | Admitting: Psychology

## 2020-03-17 DIAGNOSIS — N1831 Chronic kidney disease, stage 3a: Secondary | ICD-10-CM | POA: Diagnosis not present

## 2020-03-17 DIAGNOSIS — I129 Hypertensive chronic kidney disease with stage 1 through stage 4 chronic kidney disease, or unspecified chronic kidney disease: Secondary | ICD-10-CM | POA: Diagnosis not present

## 2020-03-17 DIAGNOSIS — N2581 Secondary hyperparathyroidism of renal origin: Secondary | ICD-10-CM | POA: Diagnosis not present

## 2020-03-17 DIAGNOSIS — E1129 Type 2 diabetes mellitus with other diabetic kidney complication: Secondary | ICD-10-CM | POA: Diagnosis not present

## 2020-03-17 DIAGNOSIS — D631 Anemia in chronic kidney disease: Secondary | ICD-10-CM | POA: Diagnosis not present

## 2020-03-17 DIAGNOSIS — R809 Proteinuria, unspecified: Secondary | ICD-10-CM | POA: Diagnosis not present

## 2020-03-19 ENCOUNTER — Ambulatory Visit (INDEPENDENT_AMBULATORY_CARE_PROVIDER_SITE_OTHER): Payer: Medicare HMO | Admitting: Pharmacist

## 2020-03-19 DIAGNOSIS — N183 Chronic kidney disease, stage 3 unspecified: Secondary | ICD-10-CM | POA: Diagnosis not present

## 2020-03-19 DIAGNOSIS — E119 Type 2 diabetes mellitus without complications: Secondary | ICD-10-CM

## 2020-03-19 DIAGNOSIS — E1122 Type 2 diabetes mellitus with diabetic chronic kidney disease: Secondary | ICD-10-CM | POA: Diagnosis not present

## 2020-03-19 DIAGNOSIS — I10 Essential (primary) hypertension: Secondary | ICD-10-CM | POA: Diagnosis not present

## 2020-03-19 DIAGNOSIS — I5020 Unspecified systolic (congestive) heart failure: Secondary | ICD-10-CM

## 2020-03-19 DIAGNOSIS — I152 Hypertension secondary to endocrine disorders: Secondary | ICD-10-CM

## 2020-03-19 DIAGNOSIS — E1159 Type 2 diabetes mellitus with other circulatory complications: Secondary | ICD-10-CM | POA: Diagnosis not present

## 2020-03-19 DIAGNOSIS — F419 Anxiety disorder, unspecified: Secondary | ICD-10-CM

## 2020-03-19 DIAGNOSIS — R490 Dysphonia: Secondary | ICD-10-CM

## 2020-03-19 NOTE — Patient Instructions (Signed)
Visit Information  PATIENT GOALS: Goals Addressed              This Visit's Progress     Patient Stated   .  Medication Monitoring (pt-stated)        Patient Goals/Self-Care Activities . Over the next 90 days, patient will:  - take medications as prescribed check glucose twice daily, document, and provide at future appointments check blood pressure daily, document, and provide at future appointments          Patient verbalizes understanding of instructions provided today and agrees to view in [place].   Plan: Telephone follow up appointment with care management team member scheduled for:  ~ 8 weeks  [name], PharmD, [place], [place] Clinical Pharmacist [employer] at [employer] [phone]

## 2020-03-19 NOTE — Chronic Care Management (AMB) (Signed)
Chronic Care Management Pharmacy Note  03/19/2020 Name:  Nancy Barajas MRN:  [id number] DOB:  1960/11/04  Subjective: Nancy Barajas is an 60 y.o. year old female who is a primary patient of McLean-Scocuzza, Nino Glow, MD.  The CCM team was consulted for assistance with disease management and care coordination needs.    Engaged with patient by telephone for follow up visit in response to provider referral for pharmacy case management and/or care coordination services.   Consent to Services:  The patient was given information about Chronic Care Management services, agreed to services, and gave verbal consent prior to initiation of services.  Please see initial visit note for detailed documentation.   Patient Care Team: McLean-Scocuzza, Nino Glow, MD as PCP - General (Internal Medicine) Kate Sable, MD as PCP - Cardiology (Cardiology) De Hollingshead, RPH-CPP as Pharmacist (Pharmacist) [name], RN as Case Manager  Recent office visits:  1/31- RN CM  2/16 - PCP, more gasping, referral to [facility]; continue ENT, psych f/u, schedule mammogram,   2/21 - RN CM - discussed eye care, anxiety   referral to pulmonary as lorazepam no benefit on gasping   Recent consult visits:  3/15 - nephrology Dr. Juleen China - bump in [place],  Hospital visits: None in previous 6 months  Objective:  Lab Results  Component Value Date   CREATININE 1.32 (H) 02/19/2020   BUN 30 (H) 02/19/2020   GFR 43.95 (L) 02/19/2020   GFRNONAA 49 (L) 05/14/2019   GFRAA 57 (L) 05/14/2019   NA 142 02/19/2020   K 4.3 02/19/2020   CALCIUM 9.6 02/19/2020   CO2 30 02/19/2020    Lab Results  Component Value Date/Time   HGBA1C 6.2 02/19/2020 10:13 AM   HGBA1C 6.4 11/12/2019 09:47 AM   GFR 43.95 (L) 02/19/2020 10:13 AM   GFR 49.87 (L) 11/12/2019 09:47 AM   MICROALBUR 28.8 02/20/2019 08:51 AM    Last diabetic Eye exam:  Lab Results  Component Value Date/Time   HMDIABEYEEXA Retinopathy  (A) 01/13/2020 12:00 AM    Last diabetic Foot exam: No results found for: HMDIABFOOTEX   Lab Results  Component Value Date   CHOL 209 (H) 02/19/2020   HDL 92.10 02/19/2020   LDLCALC 100 (H) 02/19/2020   TRIG 89.0 02/19/2020   CHOLHDL 2 02/19/2020    Hepatic Function Latest Ref Rng & Units 02/19/2020 11/12/2019 02/20/2019  Total Protein 6.0 - 8.3 g/dL 7.3 6.9 7.6  Albumin 3.5 - 5.2 g/dL 4.2 4.2 4.2  AST 0 - 37 U/L _0 ALT 0 - 35 U/L _1 Alk Phosphatase 39 - 117 U/L 57 59 74  Total Bilirubin 0.2 - 1.2 mg/dL 0.8 0.6 0.6    Lab Results  Component Value Date/Time   TSH 0.85 11/12/2019 09:47 AM   TSH 0.93 11/07/2018 08:30 AM    CBC Latest Ref Rng & Units 02/19/2020 11/12/2019 04/02/2019  WBC 4.0 - 10.5 K/uL 4.3 4.9 7.2  Hemoglobin 12.0 - 15.0 g/dL 13.5 12.8 11.9  Hematocrit 36.0 - 46.0 % 42.6 40.3 38.9  Platelets 150.0 - 400.0 K/uL 262.0 284.0 376    Lab Results  Component Value Date/Time   VD25OH 52.82 07/10/2019 09:46 AM   VD25OH 27.75 (L) 08/07/2018 08:44 AM    Clinical ASCVD: No    Depression screen [facility] 2/9 02/03/2020 12/11/2019 12/05/2019  Decreased Interest 0 0 0  Down, Depressed, Hopeless _2 PHQ - 2 Score 1 1 1  Altered sleeping - - -  Tired, decreased energy - - -  Change in appetite - - -  Feeling bad or failure about yourself  - - -  Trouble concentrating - - -  Moving slowly or fidgety/restless - - -  Suicidal thoughts - - -  PHQ-9 Score - - -  Difficult doing work/chores - - -     Social History   Tobacco Use  Smoking Status Former Smoker  . Packs/day: 1.00  . Years: 10.00  . Pack years: 10.00  . Quit date: 01/03/1998  . Years since quitting: 22.2  Smokeless Tobacco Never Used   BP Readings from Last 3 Encounters:  02/19/20 130/82  01/01/20 (!) 161/86  11/20/19 (!) 89/53   Pulse Readings from Last 3 Encounters:  02/19/20 74  01/01/20 72  11/20/19 94   Wt Readings from Last 3 Encounters:  02/19/20 191 lb (86.6 kg)  01/16/20  200 lb (90.7 kg)  01/01/20 200 lb (90.7 kg)    Assessment/Interventions: Review of patient past medical history, allergies, medications, health status, including review of consultants reports, laboratory and other test data, was performed as part of comprehensive evaluation and provision of chronic care management services.   SDOH:  (Social Determinants of Health) assessments and interventions performed: Yes SDOH Interventions   Flowsheet Row Most Recent Value  SDOH Interventions   Financial Strain Interventions Intervention Not Indicated  [medicare extra help]  Stress Interventions Other (Comment)  [provided empathetic listening, encouraged continued f/u with therapist]      CCM Care Plan  Allergies  Allergen Reactions  . Penicillins Rash    Did it involve swelling of the face/tongue/throat, SOB, or low BP? No Did it involve sudden or severe rash/hives, skin peeling, or any reaction on the inside of your mouth or nose? No Did you need to seek medical attention at a hospital or doctor's office? No When did it last happen?Childhood If all above answers are "NO", may proceed with cephalosporin use.    Medications Reviewed Today    Reviewed by De Hollingshead, RPH-CPP (Pharmacist) on 03/19/20 at [phone]  Med List Status: <None>  Medication Order Taking? Sig Documenting Provider Last Dose Status Informant  blood glucose meter kit and supplies [id number] Yes Dispense based on patient and insurance preference. Use twice daily as directed. (FOR ICD-10 E10.9, E11.9). McLean-Scocuzza, Nino Glow, MD Taking Active   carvedilol (COREG) 25 MG tablet [id number] Yes Take 1 tablet (25 mg total) by mouth 2 (two) times daily with a meal. McLean-Scocuzza, Nino Glow, MD Taking Active Self  Cholecalciferol (VITAMIN D-3) 125 MCG (5000 UT) TABS [id number] Yes Take 1 tablet by mouth daily.  Patient taking differently: Take 5,000 Units by mouth daily.   Jodelle Green, FNP Taking Active   Dulaglutide  (TRULICITY) 1.5 AC/1.6SA SOPN [id number] Yes Inject 1.5 mg into the skin once a week. McLean-Scocuzza, Nino Glow, MD Taking Active   FARXIGA 10 MG TABS tablet [id number] Yes Take 10 mg by mouth daily. [provider] Taking Active   Lancets [place] [id number]  1 Device by Does not apply route in the morning, at noon, and at bedtime. Dispense for onetouch ultra 2 machine ONLY McLean-Scocuzza, Nino Glow, MD  Active   LORazepam (ATIVAN) 0.5 MG tablet [id number] Yes Take 0.5-1 tablets (0.25-0.5 mg total) by mouth daily as needed for anxiety. McLean-Scocuzza, Nino Glow, MD Taking Active   Multiple Vitamins-Minerals (CENTRUM SILVER 50+WOMEN PO) [id number] Yes Take 1 tablet by mouth daily. [provider] Taking Active Self  rosuvastatin (CRESTOR) 10 MG tablet [id number] Yes Take 1 tablet (10 mg total) by mouth every evening. McLean-Scocuzza, Nino Glow, MD Taking Active   sacubitril-valsartan [facility]) 97-103 MG [id number] Yes Take 1 tablet by mouth 2 (two) times daily. Kate Sable, MD Taking Active   sertraline (ZOLOFT) 100 MG tablet [id number] Yes Take 1.5 tablets (150 mg total) by mouth daily. Pucilowski, Marchia Bond, MD Taking Active   sodium bicarbonate 650 MG tablet [id number] Yes Take 1 tablet (650 mg total) by mouth 3 (three) times daily. Billey Co, MD Taking Active   sodium chloride (OCEAN) 0.65 % SOLN nasal spray [id number] Yes Place 1 spray into both nostrils as needed for congestion.  [provider] Taking Active Self  spironolactone (ALDACTONE) 25 MG tablet [id number] Yes Take 1 tablet (25 mg total) by mouth daily. Kate Sable, MD Taking Active           Patient Active Problem List   Diagnosis Date Noted  . Anxiety 02/19/2020  . Obstructive uropathy 08/01/2019  . Gallstones 07/28/2019  . Umbilical hernia 78/24/2353  . Hematuria 07/15/2019  . Hypertension associated with diabetes ([place]) 07/11/2019  . Vitamin D deficiency 07/11/2019  . Hiatal hernia 07/11/2019   . Obesity (BMI 30.0-34.9) 07/11/2019  . Heart failure with reduced ejection fraction ([place])   . GAD (generalized anxiety disorder) 04/06/2019  . Proteinuria 03/04/2019  . Aortic atherosclerosis ([place]) 03/04/2019  . Thyroid nodule 03/04/2019  . Kidney cysts 03/04/2019  . Hemangioma of liver 03/04/2019  . Sinusitis 03/04/2019  . Kidney stones   . CKD stage 3 secondary to diabetes ([place]) 08/22/2018  . Laryngeal spasm 05/07/2018  . Muscle tension dysphonia 05/07/2018  . Shortness of breath 01/22/2018  . Uncontrolled hypertension 01/22/2018  . Type 2 diabetes mellitus without complication, without long-term current use of insulin ([place]) 01/22/2018  . Mixed hyperlipidemia 01/22/2018  . Mild persistent asthma 01/22/2018    Immunization History  Administered Date(s) Administered  . Influenza,inj,Quad PF,6+ Mos 10/10/2017, 11/07/2018, 11/12/2019  . PFIZER(Purple Top)SARS-COV-2 Vaccination 05/23/2019, 06/13/2019, 12/10/2019  . Pneumococcal Polysaccharide-23 07/10/2019    Conditions to be addressed/monitored:  Hypertension, Hyperlipidemia, Diabetes, Depression and Anxiety  Care Plan : Medication Management (Pharmacist)  Updates made by De Hollingshead, RPH-CPP since 03/19/2020 12:00 AM    Problem: Diabetes, CKD, HF, HTN, Anxiety     Long-Range Goal: Disease Progression Prevention   Start Date: 01/30/2020  This Visit's Progress: On track  Recent Progress: On track  Priority: High  Note:   Current Barriers:   Unable to independently afford treatment regimen  Complex patient with multiple disease states at increased risk for disease progression   Pharmacist Clinical Goal(s):   Over the next 90 days, patient will maintain control of diabetes and hypertension as evidenced by maintenance of lab work through collaboration with PharmD and provider.  Interventions: . 1:1 collaboration with McLean-Scocuzza, Nino Glow, MD regarding development and update of comprehensive plan of care as  evidenced by provider attestation and co-signature . Inter-disciplinary care team collaboration (see longitudinal plan of care) . Comprehensive medication review performed; medication list updated in electronic medical record  Health Maintenance: Marland Kitchen Mammogram upcoming, scheduled. Patient aware . Reviewed upcoming cardiology, pulmonary appointments.   Diabetes:  Controlled per last A1c, current treatment: Trulicity 1.5 mg, Farxiga 10 mg daily    Avoiding metformin d/t CKD   Current glucose readings:  Fasting Post Lunch Post Supper  5-Mar 111  104  6-Mar 113  7-Mar 98    8-Mar 100  124  9-Mar 111 134 116  10-Mar 101 116   11-Mar 93  139  12-Mar 111    13-Mar 114  100  14-Mar 101    15-Mar 123 136   16-Mar 99    17-Mar 107    AVG 106 129 117    Current meal patterns: will defer to RN CM f/u next week  Current physical activity: will defer to RN CM f/u next week  Continue to monitor glucose at least BID - fasting and 2 hours after meals  Continue current regimen at this time.    Hypertension with HFrEF (most recent EF 25-30%)  Controlled; current treatment: carvedilol 25 mg BID, spironolactone 25 mg daily, Entresto 97/103 mg BID; follows w/ Dr. Garen Lah cardiology  Current home BP readings:   SBP DBP HR   7-Mar 124 82   8-Mar 129 96 77  9-Mar 130 70 77  10-Mar 120 75 78  11-Mar 128 75 75  12-Mar 129 89 72  14-Mar 111 80 71  15-Mar 120 87 76  16-Mar 111 92 80  17-Mar 145 87 72  AVG 125 80 75    Endorses some dizziness if she stands up too quickly. Discussed standing up slowly.   Continues to weigh daily: 189-192, aware of warning weight gain of 3 lbs/day or 5 lbs/week  Reiterated appropriate BP checking technique. Anxiety contributory.   Recommended continue current regimen and collaboration w/ cardiology.    Hyperlipidemia:  Controlled per last lipid panel; current treatment: rosuvastatin 10 mg daily   Reviewed fill history; patient is up to  date  Recommended to continue current regimen  Hx Kidney Stones, Urinary Incontinence: . Controlled per patient report; current regimen: sodium bicarbonate 650 mg TID . Encouraged to keep f/u with urology moving forward. Had discussed oxybutynin at previous appt, but decided her symptoms were not "bad enough" to want to add additional medication.   Depression/Anxiety:  Improved per patient report; current treatment: sertraline 150 mg daily. Lorazepam 0.5 mg PRN added by PCP recently to see if it would help w/ vocal cord dysfunction/gasping; Follows w/ psychiatry Dr. Mamie Nick and Garald Braver.   Reports moderate improvement in gasping episodes with lorazepam. Denies over sedation concerns.   Encouraged continued treatment at this time, along with psych collaboration.  Supplement: Marland Kitchen Vitamin D 1000 units, Multivitamin    Patient Goals/Self-Care Activities . Over the next 90 days, patient will:  - take medications as prescribed check glucose twice daily, document, and provide at future appointments check blood pressure daily, document, and provide at future appointments  Follow Up Plan: Telephone follow up appointment with care management team member scheduled for: ~ 8 weeks      Medication Assistance: None required.  Patient affirms current coverage meets needs.  Patient's preferred pharmacy is:  [facility] [address] ([address]), [place] [address] [place] ([place]) [place] [address] Phone: [phone] Fax: [phone]   Care Plan and Follow Up Patient Decision:  Patient agrees to Care Plan and Follow-up.  Plan: Telephone follow up appointment with care management team member scheduled for:  ~ 8 weeks  [name], PharmD, [place], [place] Clinical Pharmacist [employer] at [employer] [phone]

## 2020-03-23 ENCOUNTER — Inpatient Hospital Stay
Admission: RE | Admit: 2020-03-23 | Discharge: 2020-03-23 | Disposition: A | Discharge status: Home / Self Care | Payer: Self-pay | Source: Ambulatory Visit | Attending: *Deleted | Admitting: *Deleted

## 2020-03-23 ENCOUNTER — Other Ambulatory Visit: Payer: Self-pay

## 2020-03-23 ENCOUNTER — Ambulatory Visit: Payer: Medicare HMO | Admitting: *Deleted

## 2020-03-23 ENCOUNTER — Other Ambulatory Visit: Payer: Self-pay | Admitting: *Deleted

## 2020-03-23 ENCOUNTER — Ambulatory Visit
Admission: RE | Admit: 2020-03-23 | Discharge: 2020-03-23 | Disposition: A | Discharge status: Home / Self Care | Payer: Medicare HMO | Source: Ambulatory Visit | Attending: Internal Medicine | Admitting: Internal Medicine

## 2020-03-23 DIAGNOSIS — E119 Type 2 diabetes mellitus without complications: Secondary | ICD-10-CM | POA: Diagnosis not present

## 2020-03-23 DIAGNOSIS — N183 Chronic kidney disease, stage 3 unspecified: Secondary | ICD-10-CM | POA: Diagnosis not present

## 2020-03-23 DIAGNOSIS — I5020 Unspecified systolic (congestive) heart failure: Secondary | ICD-10-CM

## 2020-03-23 DIAGNOSIS — I10 Essential (primary) hypertension: Secondary | ICD-10-CM

## 2020-03-23 DIAGNOSIS — I152 Hypertension secondary to endocrine disorders: Secondary | ICD-10-CM

## 2020-03-23 DIAGNOSIS — Z1231 Encounter for screening mammogram for malignant neoplasm of breast: Secondary | ICD-10-CM

## 2020-03-23 DIAGNOSIS — E1122 Type 2 diabetes mellitus with diabetic chronic kidney disease: Secondary | ICD-10-CM

## 2020-03-23 DIAGNOSIS — E1159 Type 2 diabetes mellitus with other circulatory complications: Secondary | ICD-10-CM

## 2020-03-23 NOTE — Patient Instructions (Signed)
Visit Information  PATIENT GOALS: Goals Addressed            This Visit's Progress   . (CCM RN) Find Help in My Community   Not on track    Timeframe:  Long-Range Goal Priority:  Medium Start Date:  12/11/2019                           Expected End Date:  05/02/2020         Follow Up Date:  04/27/20                 . Return call to [place] [phone] for other community resources . Continue to work with Clinical research associate for making arrangements for dental bill . Continue to use warm compresses on eye to help with sty and pain (right eye now) . Contact Medicaid or Social Services to verify medicaid benefit showing in system   Why is this important?    Knowing how and where to find help for yourself or family in your neighborhood and community is an important skill.   You will want to take some steps to learn how.    Notes:     Marland Kitchen (CCM RN) Monitor and Manage My Blood Sugar, Blood Pressure, and Daily Weights   On track    Timeframe:  Long-Range Goal Priority:  Medium Start Date:  10/07/2019                           Expected End Date:  07/02/20         Follow Up:  04/27/20             - check blood sugar at least twice a day (before meals or 2 hours post eating) - check blood sugar if I feel it is too high or too low - enter blood sugar readings and medication into daily log and take log to provider appointments -continue to monitor weights daily and keeping in log -weigh self first thing in the mornings after voiding -notify provider for increase weight 3 pounds overnight of 5 pounds within 5 days or lower extremity swelling -continue to monitor blood pressures daily, noting if blood pressures elevated if prior to taking medications and to recheck after taking morning medications   Why is this important?   Checking your blood sugar at home helps to keep it from getting very high or very low.  Writing the results in a diary or log helps the doctor know how to care for  you.  Your blood sugar log should have the time, date and the results.  Also, write down the amount of insulin or other medicine that you take.  Other information, like what you ate, exercise done and how you were feeling, will also be helpful.     Notes:      Marland Kitchen (CCM RN) Set My Target A1C-Diabetes Type 2   On track    Timeframe:  Long-Range Goal Priority:  Medium Start Date:   12/11/2019                          Expected End Date:  07/02/2020      Follow Up Date:  04/27/20                   - set target A1C; current A1C decreased to 6.2(02/19/20) -  discuss with PCP goal Hgb A1C    Why is this important?    Your target A1C is decided together by you and your doctor.   It is based on several things like your age and other health issues.    Notes:        Patient verbalizes understanding of instructions provided today and agrees to view in [place].   The care management team will reach out to the patient again over the next 45 business days.   Hubert Azure RN, MSN RN Care Management Coordinator [place] [phone] [email or web address]

## 2020-03-23 NOTE — Chronic Care Management (AMB) (Signed)
Chronic Care Management   CCM RN Visit Note  03/23/2020 Name: Nancy Barajas MRN: [id number] DOB: 14-Sep-1960  Subjective: Nancy Barajas is a 60 y.o. year old female who is a primary care patient of McLean-Scocuzza, Nino Glow, MD. The care management team was consulted for assistance with disease management and care coordination needs.    Engaged with patient by telephone for follow up visit in response to provider referral for case management and/or care coordination services.   Consent to Services:  The patient was given information about Chronic Care Management services, agreed to services, and gave verbal consent prior to initiation of services.  Please see initial visit note for detailed documentation.   Patient agreed to services and verbal consent obtained.   Assessment: Review of patient past medical history, allergies, medications, health status, including review of consultants reports, laboratory and other test data, was performed as part of comprehensive evaluation and provision of chronic care management services.   SDOH (Social Determinants of Health) assessments and interventions performed:    CCM Care Plan  Allergies  Allergen Reactions  . Penicillins Rash    Did it involve swelling of the face/tongue/throat, SOB, or low BP? No Did it involve sudden or severe rash/hives, skin peeling, or any reaction on the inside of your mouth or nose? No Did you need to seek medical attention at a hospital or doctor's office? No When did it last happen?Childhood If all above answers are "NO", may proceed with cephalosporin use.    Outpatient Encounter Medications as of 03/23/2020  Medication Sig  . blood glucose meter kit and supplies Dispense based on patient and insurance preference. Use twice daily as directed. (FOR ICD-10 E10.9, E11.9).  . carvedilol (COREG) 25 MG tablet Take 1 tablet (25 mg total) by mouth 2 (two) times daily with a meal.  . Cholecalciferol (VITAMIN  D-3) 125 MCG (5000 UT) TABS Take 1 tablet by mouth daily. (Patient taking differently: Take 5,000 Units by mouth daily.)  . Dulaglutide (TRULICITY) 1.5 HD/6.2IW SOPN Inject 1.5 mg into the skin once a week.  Marland Kitchen FARXIGA 10 MG TABS tablet Take 10 mg by mouth daily.  . Lancets MISC 1 Device by Does not apply route in the morning, at noon, and at bedtime. Dispense for onetouch ultra 2 machine ONLY  . LORazepam (ATIVAN) 0.5 MG tablet Take 0.5-1 tablets (0.25-0.5 mg total) by mouth daily as needed for anxiety.  . Multiple Vitamins-Minerals (CENTRUM SILVER 50+WOMEN PO) Take 1 tablet by mouth daily.  . rosuvastatin (CRESTOR) 10 MG tablet Take 1 tablet (10 mg total) by mouth every evening.  . sacubitril-valsartan (ENTRESTO) 97-103 MG Take 1 tablet by mouth 2 (two) times daily.  . sertraline (ZOLOFT) 100 MG tablet Take 1.5 tablets (150 mg total) by mouth daily.  . sodium bicarbonate 650 MG tablet Take 1 tablet (650 mg total) by mouth 3 (three) times daily.  . sodium chloride (OCEAN) 0.65 % SOLN nasal spray Place 1 spray into both nostrils as needed for congestion.   Marland Kitchen spironolactone (ALDACTONE) 25 MG tablet Take 1 tablet (25 mg total) by mouth daily.   No facility-administered encounter medications on file as of 03/23/2020.    Patient Active Problem List   Diagnosis Date Noted  . Anxiety 02/19/2020  . Obstructive uropathy 08/01/2019  . Gallstones 07/28/2019  . Umbilical hernia 97/98/9211  . Hematuria 07/15/2019  . Hypertension associated with diabetes ([place]) 07/11/2019  . Vitamin D deficiency 07/11/2019  . Hiatal hernia 07/11/2019  . Obesity (BMI  30.0-34.9) 07/11/2019  . Heart failure with reduced ejection fraction ([place])   . GAD (generalized anxiety disorder) 04/06/2019  . Proteinuria 03/04/2019  . Aortic atherosclerosis ([place]) 03/04/2019  . Thyroid nodule 03/04/2019  . Kidney cysts 03/04/2019  . Hemangioma of liver 03/04/2019  . Sinusitis 03/04/2019  . Kidney stones   . CKD stage 3 secondary  to diabetes ([place]) 08/22/2018  . Laryngeal spasm 05/07/2018  . Muscle tension dysphonia 05/07/2018  . Shortness of breath 01/22/2018  . Uncontrolled hypertension 01/22/2018  . Type 2 diabetes mellitus without complication, without long-term current use of insulin ([place]) 01/22/2018  . Mixed hyperlipidemia 01/22/2018  . Mild persistent asthma 01/22/2018    Conditions to be addressed/monitored:CHF, HTN and DMII  Care Plan : Diabetes/Heart Failure/Hypertension  Updates made by [name], RN since 03/23/2020 12:00 AM  Problem: Management of Diabetes/CHF/HTN   Priority: Medium  Long-Range Goal: Glycemic Management Optimized   Start Date: 12/11/2019  Expected End Date: 09/02/2020  This Visit's Progress: On track  Recent Progress: On track  Priority: Medium  Objective:  Lab Results  Component Value Date   HGBA1C 6.2 02/19/2020 .   Lab Results  Component Value Date   CREATININE 1.32 (H) 02/19/2020   CREATININE 1.19 11/12/2019   CREATININE 1.21 (H) 05/14/2019   Current Barriers:  Marland Kitchen Knowledge deficit related to self care management of chronic medical conditions as evidenced by patient continuing to report difficulties affording co pays.  Discussed with patient needing to re contact Care Guides for community resources.  Also discussed Medicaid and following up with Social Services to verify if patient has medicaid to help with co pays.  Provided patient with both numbers to care guides and [place].  Patient now reporting left eye sty is healing and now developing sty on right eye.  Encouraged to use warm compresses for right eye and to notify primary care or eye provider if eye worsens or does not get better.  Continues to weigh daily (190, 189-190), monitor blood pressures daily (137/80, 120-140/70-90's), and check blood sugars about twice a day (97, 90-120's).  Has been very good with keeping vital sign log. Denies any lower extremity edema and denies any shortness of  breath.  Continues to have episodes with voice and has been taking Ativan as needed with some relief. . Social issues and knowledge deficits affecting self care management of chronic conditions: Diabetes, Hypertension, Heart Failure  Case Manager Clinical Goal(s):  Marland Kitchen Collaboration with McLean-Scocuzza, Nino Glow, MD regarding development and update of comprehensive plan of care as evidenced by provider attestation and co-signature . Inter-disciplinary care team collaboration (see longitudinal plan of care) . Over the next 60 days, patient will demonstrate improved adherence to prescribed treatment plan for diabetes self care/management as evidenced by: compliance with taking Trulicity-states she has been taking this medication as prescribed . Encourage lifestyle changes, such as increased intake of plant-based foods, stress reduction, consistent physical activity and medication compliance to prevent long-term complications and chronic disease.   . Encourage daily monitoring and recording of CBG, blood pressure, and weights . Discuss importance of adherence to ADA/ carb modified Heart Healthy diet . Discuss importance of adherence to prescribed medication regimen Case Manager Interventions:   General . Reviewed medications with patient and discussed importance of medication adherence; discussed taking prescribed anxiety medication to help with voice and increase anxiety episodes . Discussed plans with patient for ongoing care management follow up and provided patient with direct contact information for care  management team . Discussed and encouraged patient to continue to return call to Care Guide for community resources . Encouraged to continue to attend therapy/counseling sessions to help with depression; offered CCM Social Work referral for depression resources (declines at this time) . Discussed using deep breathing exercises when feeling anxious . Encouraged to continue with warm compresses to eye  as instructed by eye provider and to contact provider for worsening symptoms . Congratulated patient on keeping log of vitals for easy provider review  Diabetes . Advised patient, providing education and rationale, to check cbg at least twice a day and record, calling primary care provider or CCM Embedded Pharmacist for findings outside established parameters.   . Congratulated patient on current CBG trends and encouraged to review what her goal A1C should be with provider . Reviewed current glucose readings and discussed ways to keep them within range HTN . Encouraged to continue to monitor blood pressures daily, and to notify provider for blood pressures elevations or lows CHF . Continue to monitor weights daily, first thing in the morning when she wakens after voiding and to notify provider for 3 pound weight gain overnight or 5 pounds within 1 week  Patient Goals/Self-Care Activities Over the next 90 days, patient will:  General . Return call to [place] [phone] for other community resources . Continue to work with Clinical research associate for making arrangements for dental bill . Continue to use warm compresses on eye to help with sty and pain (right eye now) . Contact Medicaid or Social Services to verify medicaid benefit showing in system Diabetes . check blood sugar at least twice a day (before meals or 2 hours post eating) . check blood sugar if I feel it is too high or too low . enter blood sugar readings and medication into daily log and take log to provider appointments . set target A1C; current A1C decreased to 6.2(02/19/20) . discuss with PCP goal Hgb A1C  HTN . Continue to monitor blood pressures daily, noting if blood pressures elevated if prior to taking medications and to recheck after taking morning medications CHF . Continue to monitor weights daily and keeping in log  . Weigh self first thing in the mornings after voiding . Notify provider for increase weight 3  pounds overnight of 5 pounds within 5 days or lower extremity swelling Follow Up Plan: The care management team will reach out to the patient again over the next 45 business days.      Plan:The care management team will reach out to the patient again over the next 45 business days.  Hubert Azure RN, MSN RN Care Management Coordinator [place] [phone] [email or web address]

## 2020-03-24 ENCOUNTER — Ambulatory Visit (INDEPENDENT_AMBULATORY_CARE_PROVIDER_SITE_OTHER): Payer: Medicare HMO | Admitting: Psychology

## 2020-03-24 DIAGNOSIS — F418 Other specified anxiety disorders: Secondary | ICD-10-CM

## 2020-03-24 DIAGNOSIS — R69 Illness, unspecified: Secondary | ICD-10-CM | POA: Diagnosis not present

## 2020-03-25 ENCOUNTER — Telehealth: Payer: Self-pay

## 2020-03-25 ENCOUNTER — Other Ambulatory Visit: Payer: Self-pay | Admitting: Internal Medicine

## 2020-03-25 NOTE — Telephone Encounter (Signed)
Faxed CGM physician's order for libre reader/sensors to [facility] [employer] at [phone] on 03/24/20

## 2020-04-07 ENCOUNTER — Ambulatory Visit (INDEPENDENT_AMBULATORY_CARE_PROVIDER_SITE_OTHER): Payer: Medicare HMO | Admitting: Psychology

## 2020-04-07 DIAGNOSIS — F418 Other specified anxiety disorders: Secondary | ICD-10-CM | POA: Diagnosis not present

## 2020-04-07 DIAGNOSIS — R69 Illness, unspecified: Secondary | ICD-10-CM | POA: Diagnosis not present

## 2020-04-09 ENCOUNTER — Other Ambulatory Visit: Payer: Self-pay | Admitting: Internal Medicine

## 2020-04-15 ENCOUNTER — Ambulatory Visit (INDEPENDENT_AMBULATORY_CARE_PROVIDER_SITE_OTHER): Payer: Medicare HMO | Admitting: Pulmonary Disease

## 2020-04-15 ENCOUNTER — Other Ambulatory Visit: Payer: Self-pay

## 2020-04-15 ENCOUNTER — Encounter: Payer: Self-pay | Admitting: Pulmonary Disease

## 2020-04-15 VITALS — BP 140/80 | HR 82 | Temp 97.5°F | Ht 67.76 in | Wt 194.0 lb

## 2020-04-15 DIAGNOSIS — J383 Other diseases of vocal cords: Secondary | ICD-10-CM

## 2020-04-15 DIAGNOSIS — I5020 Unspecified systolic (congestive) heart failure: Secondary | ICD-10-CM | POA: Diagnosis not present

## 2020-04-15 DIAGNOSIS — R0602 Shortness of breath: Secondary | ICD-10-CM

## 2020-04-15 NOTE — Patient Instructions (Addendum)
We are going to do breathing tests again.  I am referring you to neurology.  We will see you in follow-up in 4 to 6 weeks time call sooner should any new difficulties arise.

## 2020-04-15 NOTE — Progress Notes (Signed)
Subjective:    Patient ID: Nancy Barajas, female    DOB: May 09, 1960, 60 y.o.   MRN: [id number]  HPI The patient is a 60 year old remote former smoker who presents for follow-up, she has not been seen here since January 2020.  Please refer to that initial evaluation note dated to 11 January 2018.  At that time we identified issues with laryngeal dystonia as the cause for her "wheezing", stridor and dysphonia.  Referred to [facility] and they have been treating the patient.  She has been referred again for evaluation of these issues.  She has the same symptoms she had previously and that she has had difficulties with her voice for about 5 to 6 years.  She has been fairly stable with this.  Speech is frequently interrupted by gasping and stridorous inhalation.  At times she feels like she is hyperventilating.  This is worse when she is speaking on the phone and worse with anxiety.  This does not occur when she is sleeping, does not happen with exercise or if she is not talking.  Feels that the coordination between breathing and speaking is quite challenging.  She does involuntary gasping and makes her throat feel tight.  In general she does not feel short of breath but can feel fatigued after she has been speaking for a period of time.  She only notices difficulty inhaling when she is speaking.  She does not feel tightness in the abdomen.  The gasping on inhalation does not appear to occur when she is swallowing but may happen before she swallows.  She has been diagnosed previously with functional dysphonia as symptoms appear to be less indicative of spasmodic dystonia.  This was after evaluation at [facility].  Since her last visit she also has been diagnosed with nonischemic cardiomyopathy.  Most recent EF was noted to be 40 to 45% improved from a prior 25 to 30%.  She continues follow-up with cardiology for these issues.  This may aggravate her respiratory pattern as sometimes  cardiomyopathy can lead to Cheyne-Stokes type breathing.  PFTs obtained on 15 February 2018 were limited to spirometry.  Spirometry was normal without evidence of obstruction.  Flow volume loop did not show obstructive pattern.  We had previously ordered a CT chest with contrast performed 07 February 2018 this did not show significant thyromegaly.  It described previously noted nodules on the thyroid.  No goiter.  Review of Systems A 10 point review of systems was performed and it is as noted above otherwise negative.  Patient Active Problem List   Diagnosis Date Noted  . Anxiety 02/19/2020  . Obstructive uropathy 08/01/2019  . Gallstones 07/28/2019  . Umbilical hernia 19/62/2297  . Hematuria 07/15/2019  . Hypertension associated with diabetes ([place]) 07/11/2019  . Vitamin D deficiency 07/11/2019  . Hiatal hernia 07/11/2019  . Obesity (BMI 30.0-34.9) 07/11/2019  . Heart failure with reduced ejection fraction ([place])   . GAD (generalized anxiety disorder) 04/06/2019  . Proteinuria 03/04/2019  . Aortic atherosclerosis ([place]) 03/04/2019  . Thyroid nodule 03/04/2019  . Kidney cysts 03/04/2019  . Hemangioma of liver 03/04/2019  . Sinusitis 03/04/2019  . Kidney stones   . CKD stage 3 secondary to diabetes ([place]) 08/22/2018  . Laryngeal spasm 05/07/2018  . Muscle tension dysphonia 05/07/2018  . Shortness of breath 01/22/2018  . Uncontrolled hypertension 01/22/2018  . Type 2 diabetes mellitus without complication, without long-term current use of insulin ([place]) 01/22/2018  . Mixed hyperlipidemia 01/22/2018  .  Mild persistent asthma 01/22/2018   Social History   Tobacco Use  . Smoking status: Former Smoker    Packs/day: 1.00    Years: 10.00    Pack years: 10.00    Quit date: 01/03/1998    Years since quitting: 22.2  . Smokeless tobacco: Never Used  Substance Use Topics  . Alcohol use: Not Currently   Allergies  Allergen Reactions  . Penicillins Rash    Did it involve swelling of  the face/tongue/throat, SOB, or low BP? No Did it involve sudden or severe rash/hives, skin peeling, or any reaction on the inside of your mouth or nose? No Did you need to seek medical attention at a hospital or doctor's office? No When did it last happen?Childhood If all above answers are "NO", may proceed with cephalosporin use.   Current Meds  Medication Sig  . blood glucose meter kit and supplies Dispense based on patient and insurance preference. Use twice daily as directed. (FOR ICD-10 E10.9, E11.9).  . carvedilol (COREG) 25 MG tablet Take 1 tablet (25 mg total) by mouth 2 (two) times daily with a meal.  . Cholecalciferol (VITAMIN D-3) 125 MCG (5000 UT) TABS Take 1 tablet by mouth daily. (Patient taking differently: Take 5,000 Units by mouth daily.)  . Dulaglutide (TRULICITY) 1.5 MV/7.8IO SOPN Inject 1.5 mg into the skin once a week.  Marland Kitchen FARXIGA 10 MG TABS tablet Take 10 mg by mouth daily.  . Lancets MISC 1 Device by Does not apply route in the morning, at noon, and at bedtime. Dispense for onetouch ultra 2 machine ONLY  . LORazepam (ATIVAN) 0.5 MG tablet Take 0.5-1 tablets (0.25-0.5 mg total) by mouth daily as needed for anxiety.  . Multiple Vitamins-Minerals (CENTRUM SILVER 50+WOMEN PO) Take 1 tablet by mouth daily.  Glory Rosebush ULTRA test strip CHECK BLOOD SUGAR TWICE DAILY AS DIRECTED  . rosuvastatin (CRESTOR) 10 MG tablet Take 1 tablet (10 mg total) by mouth every evening.  . sacubitril-valsartan (ENTRESTO) 97-103 MG Take 1 tablet by mouth 2 (two) times daily.  . sertraline (ZOLOFT) 100 MG tablet Take 1.5 tablets (150 mg total) by mouth daily.  . sodium bicarbonate 650 MG tablet Take 1 tablet (650 mg total) by mouth 3 (three) times daily.  . sodium chloride (OCEAN) 0.65 % SOLN nasal spray Place 1 spray into both nostrils as needed for congestion.   Marland Kitchen spironolactone (ALDACTONE) 25 MG tablet Take 1 tablet (25 mg total) by mouth daily.   Immunization History  Administered  Date(s) Administered  . Influenza,inj,Quad PF,6+ Mos 10/10/2017, 11/07/2018, 11/12/2019  . PFIZER(Purple Top)SARS-COV-2 Vaccination 05/23/2019, 06/13/2019, 12/10/2019  . Pneumococcal Polysaccharide-23 07/10/2019       Objective:   Physical Exam BP 140/80 (BP Location: Left Arm, Patient Position: Sitting, Cuff Size: Normal)   Pulse 82   Temp (!) 97.5 F (36.4 C) (Temporal)   Ht 5' 7.76" (1.721 m)   Wt 194 lb (88 kg)   LMP 01/03/2013   SpO2 99%   BMI 29.71 kg/m  GENERAL: Well-developed somewhat overweight woman, no acute distress.  Speaks with a hoarse voice, halting, occasional stridor sounds. HEAD: Normocephalic, atraumatic.  EYES: Pupils equal, round, reactive to light.  No scleral icterus.  MOUTH: Nose/mouth/throat not examined due to masking requirements for COVID 19. NECK: Supple. No thyromegaly. Trachea midline. No JVD.  No adenopathy. PULMONARY: Upper airway pseudowheeze.  Good air entry bilaterally.  No adventitious sounds. CARDIOVASCULAR: S1 and S2. Regular rate and rhythm.  No rubs, murmurs or  gallops heard. ABDOMEN: MUSCULOSKELETAL: No joint deformity, no clubbing, no edema.  NEUROLOGIC: No overt focal deficit. SKIN: Intact,warm,dry. PSYCH: Anxious mood, behavior normal.       Assessment & Plan:     ICD-10-CM   1. 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 Heart failure with reduced ejection fraction (HCC)  I50.20    This issue adds complexity to her management May lead to Cheyne-Stokes type respirations  3. Shortness of breath  R06.02 Pulmonary Function Test [facility] Only   PFTs as noted above Occurs only as voice fatigues   Orders Placed This Encounter  Procedures  . Ambulatory referral to Neurology    Referral Priority:   Routine     Referral Type:   Consultation    Referral Reason:   Specialty Services Required    Requested Specialty:   Neurology    Number of Visits Requested:   1  . Pulmonary Function Test [facility] Only    Standing Status:   Future    Standing Expiration Date:   04/15/2021    Scheduling Instructions:     3 weeks    Order Specific Question:   Full PFT: includes the following: basic spirometry, spirometry pre & post bronchodilator, diffusion capacity (DLCO), lung volumes    Answer:   Full PFT   Discussion:  She has had longstanding issues with dysphonia.  This is not asthma or obstructive lung disease.  She has not shown any evidence of obstruction on prior pulmonary function tests.  The issue is trying to sort out whether this is actually spasmodic dysphonia or functional dysphonia.  [facility] needs [facility].  Will obtain neurologic evaluation to ensure that there is no neurologic issue leading to this problem.  We will see the patient in follow-up time.  Be advised however that are input will be limited in this situation as it is not a primary pulmonary pathology issue.  Renold Don, MD [facility]   *This note was dictated using voice recognition software/Dragon.  Despite best efforts to proofread, errors can occur which can change the meaning.  Any change was purely unintentional.

## 2020-04-20 ENCOUNTER — Encounter: Payer: Self-pay | Admitting: Pulmonary Disease

## 2020-04-24 ENCOUNTER — Ambulatory Visit (INDEPENDENT_AMBULATORY_CARE_PROVIDER_SITE_OTHER): Payer: Medicare HMO | Admitting: Psychology

## 2020-04-24 DIAGNOSIS — F418 Other specified anxiety disorders: Secondary | ICD-10-CM | POA: Diagnosis not present

## 2020-04-24 DIAGNOSIS — R69 Illness, unspecified: Secondary | ICD-10-CM | POA: Diagnosis not present

## 2020-04-27 ENCOUNTER — Ambulatory Visit (INDEPENDENT_AMBULATORY_CARE_PROVIDER_SITE_OTHER): Payer: Medicare HMO | Admitting: *Deleted

## 2020-04-27 DIAGNOSIS — I5020 Unspecified systolic (congestive) heart failure: Secondary | ICD-10-CM

## 2020-04-27 DIAGNOSIS — E1159 Type 2 diabetes mellitus with other circulatory complications: Secondary | ICD-10-CM | POA: Diagnosis not present

## 2020-04-27 DIAGNOSIS — E119 Type 2 diabetes mellitus without complications: Secondary | ICD-10-CM | POA: Diagnosis not present

## 2020-04-27 DIAGNOSIS — I152 Hypertension secondary to endocrine disorders: Secondary | ICD-10-CM

## 2020-04-27 NOTE — Chronic Care Management (AMB) (Signed)
Chronic Care Management   CCM RN Visit Note  04/27/2020 Name: [name] [place] MRN: [id number] DOB: [birth year]  Subjective: Nancy Barajas is a 60 y.o. year old female who is a primary care patient of McLean-Scocuzza, Nino Glow, MD. The care management team was consulted for assistance with disease management and care coordination needs.    Engaged with patient by telephone for follow up visit in response to provider referral for case management and/or care coordination services.   Consent to Services:  The patient was given information about Chronic Care Management services, agreed to services, and gave verbal consent prior to initiation of services.  Please see initial visit note for detailed documentation.   Patient agreed to services and verbal consent obtained.   Assessment: Review of patient past medical history, allergies, medications, health status, including review of consultants reports, laboratory and other test data, was performed as part of comprehensive evaluation and provision of chronic care management services.   SDOH (Social Determinants of Health) assessments and interventions performed:    CCM Care Plan  Allergies  Allergen Reactions  . Penicillins Rash    Did it involve swelling of the face/tongue/throat, SOB, or low BP? No Did it involve sudden or severe rash/hives, skin peeling, or any reaction on the inside of your mouth or nose? No Did you need to seek medical attention at a hospital or doctor's office? No When did it last happen?Childhood If all above answers are "NO", may proceed with cephalosporin use.    Outpatient Encounter Medications as of 04/27/2020  Medication Sig  . Dulaglutide (TRULICITY) 1.5 OR/5.6FB SOPN Inject 1.5 mg into the skin once a week.  Marland Kitchen FARXIGA 10 MG TABS tablet Take 10 mg by mouth daily.  Marland Kitchen LORazepam (ATIVAN) 0.5 MG tablet Take 0.5-1 tablets (0.25-0.5 mg total) by mouth daily as needed for anxiety.  Marland Kitchen spironolactone  (ALDACTONE) 25 MG tablet Take 1 tablet (25 mg total) by mouth daily.  . blood glucose meter kit and supplies Dispense based on patient and insurance preference. Use twice daily as directed. (FOR ICD-10 E10.9, E11.9).  . carvedilol (COREG) 25 MG tablet Take 1 tablet (25 mg total) by mouth 2 (two) times daily with a meal.  . Cholecalciferol (VITAMIN D-3) 125 MCG (5000 UT) TABS Take 1 tablet by mouth daily. (Patient taking differently: Take 5,000 Units by mouth daily.)  . Lancets MISC 1 Device by Does not apply route in the morning, at noon, and at bedtime. Dispense for onetouch ultra 2 machine ONLY  . Multiple Vitamins-Minerals (CENTRUM SILVER 50+WOMEN PO) Take 1 tablet by mouth daily.  Glory Rosebush ULTRA test strip CHECK BLOOD SUGAR TWICE DAILY AS DIRECTED  . rosuvastatin (CRESTOR) 10 MG tablet Take 1 tablet (10 mg total) by mouth every evening.  . sacubitril-valsartan (ENTRESTO) 97-103 MG Take 1 tablet by mouth 2 (two) times daily.  . sertraline (ZOLOFT) 100 MG tablet Take 1.5 tablets (150 mg total) by mouth daily.  . sodium bicarbonate 650 MG tablet Take 1 tablet (650 mg total) by mouth 3 (three) times daily.  . sodium chloride (OCEAN) 0.65 % SOLN nasal spray Place 1 spray into both nostrils as needed for congestion.    No facility-administered encounter medications on file as of 04/27/2020.    Patient Active Problem List   Diagnosis Date Noted  . Anxiety 02/19/2020  . Obstructive uropathy 08/01/2019  . Gallstones 07/28/2019  . Umbilical hernia 37/94/3276  . Hematuria 07/15/2019  . Hypertension associated with diabetes ([place]) 07/11/2019  .  Vitamin D deficiency 07/11/2019  . Hiatal hernia 07/11/2019  . Obesity (BMI 30.0-34.9) 07/11/2019  . Heart failure with reduced ejection fraction ([place])   . GAD (generalized anxiety disorder) 04/06/2019  . Proteinuria 03/04/2019  . Aortic atherosclerosis ([place]) 03/04/2019  . Thyroid nodule 03/04/2019  . Kidney cysts 03/04/2019  . Hemangioma of liver  03/04/2019  . Sinusitis 03/04/2019  . Kidney stones   . CKD stage 3 secondary to diabetes ([place]) 08/22/2018  . Laryngeal spasm 05/07/2018  . Muscle tension dysphonia 05/07/2018  . Shortness of breath 01/22/2018  . Uncontrolled hypertension 01/22/2018  . Type 2 diabetes mellitus without complication, without long-term current use of insulin ([place]) 01/22/2018  . Mixed hyperlipidemia 01/22/2018  . Mild persistent asthma 01/22/2018    Conditions to be addressed/monitored:CHF, HTN and DMII  Care Plan : Diabetes/Heart Failure/Hypertension  Updates made by [name], RN since 04/27/2020 12:00 AM  Problem: Management of Diabetes/CHF/HTN   Priority: Medium  Long-Range Goal: Management of Diabetes/CHF/HTN   Start Date: 12/11/2019  Expected End Date: 11/02/2020  This Visit's Progress: On track  Recent Progress: On track  Priority: Medium  Objective:  Lab Results  Component Value Date   HGBA1C 6.2 02/19/2020 .   Lab Results  Component Value Date   CREATININE 1.32 (H) 02/19/2020   CREATININE 1.19 11/12/2019   CREATININE 1.21 (H) 05/14/2019  Current Barriers:  Marland Kitchen Knowledge deficit related to self care management of chronic medical conditions; Continues to weigh daily (189, 189-190), monitor blood pressures daily (120-149/80-90s), and check blood sugars about twice a day (105, 90-110's).  Has been very good with keeping vital sign log. Denies any lower extremity edema and denies any shortness of breath.  Continues to have episodes with voice and has been taking Ativan as needed with some relief.  Has seen pulmonologist and has consultation with Neurologist scheduled. . Social issues and knowledge deficits affecting self care management of chronic conditions: Diabetes, Hypertension, Heart Failure  Case Manager Clinical Goal(s):  Marland Kitchen Collaboration with McLean-Scocuzza, Nino Glow, MD regarding development and update of comprehensive plan of care as evidenced by provider attestation and  co-signature . Inter-disciplinary care team collaboration (see longitudinal plan of care) . Over the next 120days, patient will demonstrate improved adherence to prescribed treatment plan for diabetes self care/management as evidenced by: compliance with taking Trulicity-states she has been taking this medication as prescribed . Encourage lifestyle changes, such as increased intake of plant-based foods, stress reduction, consistent physical activity and medication compliance to prevent long-term complications and chronic disease.   . Encourage daily monitoring and recording of CBG, blood pressure, and weights . Discuss importance of adherence to ADA/ carb modified Heart Healthy diet . Discuss importance of adherence to prescribed medication regimen Case Manager Interventions:   General . Reviewed medications with patient and discussed importance of medication adherence; discussed taking prescribed anxiety medication to help with voice and increase anxiety episodes . Discussed plans with patient for ongoing care management follow up and provided patient with direct contact information for care management team . Discussed Medicaid still showing as insurance option and encouraged patient to contact Medicaid office to speak with worker to confirm . Encouraged to continue to attend therapy/counseling sessions to help with depression . Discussed using deep breathing exercises when feeling anxious . Encouraged to keep and attend scheduled medical appointments:  Neurology consultation on 5/2 . Congratulated patient on keeping log of vitals for easy provider review . Discussed use of Ativan and instructed not to take  old prescription for Trazadone, encouraged to speak with providers about anxiety/sleeping medications Diabetes . Advised patient, providing education and rationale, to check cbg at least twice a day and record, calling primary care provider or CCM Embedded Pharmacist for findings outside  established parameters.   . Congratulated patient on current CBG trends and encouraged to review what her goal A1C should be with provider . Reviewed current glucose readings and discussed ways to keep them within range HTN . Encouraged to continue to monitor blood pressures daily, and to notify provider for blood pressures elevations or lows . Instructed and encouraged to change batteries in blood pressure machine CHF . Continue to monitor weights daily, first thing in the morning when she wakens after voiding and to notify provider for 3 pound weight gain overnight or 5 pounds within 1 week  . Encouraged to monitor feet, legs, and abdomen for swelling . Instructed and encouraged to change batteries in scale Patient Goals/Self-Care Activities Over the next 120 days, patient will:  General . Continue to work with Clinical research associate for making arrangements for dental bill . Contact Medicaid or Social Services to verify medicaid benefit showing in system . Change batteries in scale and blood pressure cuff . Discuss medications with primary care provider or neurologist (Ativan and Trazadone) do not take medications together Diabetes . Check blood sugar at least twice a day (before meals or 2 hours post eating) . Check blood sugar if I feel it is too high or too low . Enter blood sugar readings and medication into daily log and take log to provider appointments HTN . Continue to monitor blood pressures daily, noting if blood pressures elevated if prior to taking medications and to recheck after taking morning medications CHF . Continue to monitor weights daily and keeping in log . Weigh self first thing in the mornings after voiding . Notify provider for increase weight 3 pounds overnight of 5 pounds within 5 days or lower extremity swelling Follow Up Plan: The care management team will reach out to the patient again over the next 45 business days.      Plan:The care management team will  reach out to the patient again over the next 45 business days.  Hubert Azure RN, MSN RN Care Management Coordinator [place] [phone] [email or web address]

## 2020-04-27 NOTE — Patient Instructions (Signed)
Visit Information  PATIENT GOALS: Goals Addressed            This Visit's Progress   . (CCM RN) Find Help in My Community   Not on track    Timeframe:  Long-Range Goal Priority:  Medium Start Date:  12/11/2019                           Expected End Date:  08/01/2020         Follow Up Date:  05/27/20                 . Continue to work with Clinical research associate for making arrangements for dental bill . Contact Medicaid or Social Services to verify medicaid benefit showing in system   Why is this important?    Knowing how and where to find help for yourself or family in your neighborhood and community is an important skill.   You will want to take some steps to learn how.    Notes:     Marland Kitchen (CCM RN) Monitor and Manage My Blood Sugar, Blood Pressure, and Daily Weights   On track    Timeframe:  Long-Range Goal Priority:  Medium Start Date:  10/07/2019                           Expected End Date:  11/02/20         Follow Up:  05/27/20             . Check blood sugar at least twice a day (before meals or 2 hours post eating) . Check blood sugar if I feel it is too high or too low . Enter blood sugar readings and medication into daily log and take log to provider appointments . Continue to monitor weights daily and keeping in log . Weigh self first thing in the mornings after voiding . Notify provider for increase weight 3 pounds overnight of 5 pounds within 5 days or lower extremity swelling . Continue to monitor blood pressures daily, noting if blood pressures elevated if prior to taking medications and to recheck after taking morning medications .  Change batteries in scale and blood pressure cuff . Discuss medications with primary care provider or neurologist (Ativan and Trazadone) do not take medications together  Why is this important?   Checking your blood sugar at home helps to keep it from getting very high or very low.  Writing the results in a diary or log helps the doctor know  how to care for you.  Your blood sugar log should have the time, date and the results.  Also, write down the amount of insulin or other medicine that you take.  Other information, like what you ate, exercise done and how you were feeling, will also be helpful.     Notes:      Marland Kitchen (CCM RN) Set My Target A1C-Diabetes Type 2   On track    Timeframe:  Long-Range Goal Priority:  Medium Start Date:   12/11/2019                          Expected End Date:  11/02/2020      Follow Up Date:  05/27/20                   . Set target A1C; current A1C decreased to  6.2(02/19/20) . Discuss with PCP goal Hgb A1C    Why is this important?    Your target A1C is decided together by you and your doctor.   It is based on several things like your age and other health issues.    Notes:        Patient verbalizes understanding of instructions provided today and agrees to view in [place].   The care management team will reach out to the patient again over the next 45 business days.   Hubert Azure RN, MSN RN Care Management Coordinator [place] [phone] [email or web address]

## 2020-05-04 DIAGNOSIS — E538 Deficiency of other specified B group vitamins: Secondary | ICD-10-CM | POA: Diagnosis not present

## 2020-05-04 DIAGNOSIS — J383 Other diseases of vocal cords: Secondary | ICD-10-CM | POA: Diagnosis not present

## 2020-05-04 DIAGNOSIS — E559 Vitamin D deficiency, unspecified: Secondary | ICD-10-CM | POA: Diagnosis not present

## 2020-05-05 ENCOUNTER — Other Ambulatory Visit (HOSPITAL_COMMUNITY): Payer: Self-pay | Admitting: Neurology

## 2020-05-05 ENCOUNTER — Other Ambulatory Visit: Payer: Self-pay | Admitting: Neurology

## 2020-05-05 DIAGNOSIS — J383 Other diseases of vocal cords: Secondary | ICD-10-CM

## 2020-05-08 ENCOUNTER — Ambulatory Visit (INDEPENDENT_AMBULATORY_CARE_PROVIDER_SITE_OTHER): Payer: Medicare HMO | Admitting: Psychology

## 2020-05-08 DIAGNOSIS — F418 Other specified anxiety disorders: Secondary | ICD-10-CM

## 2020-05-08 DIAGNOSIS — R69 Illness, unspecified: Secondary | ICD-10-CM | POA: Diagnosis not present

## 2020-05-11 ENCOUNTER — Other Ambulatory Visit: Payer: Self-pay

## 2020-05-11 DIAGNOSIS — I428 Other cardiomyopathies: Secondary | ICD-10-CM

## 2020-05-11 MED ORDER — ENTRESTO 97-103 MG PO TABS: 1.0000 | ORAL_TABLET | Freq: Two times a day (BID) | ORAL | 3 refills | Status: DC

## 2020-05-14 ENCOUNTER — Ambulatory Visit: Payer: Medicare HMO | Admitting: Cardiology

## 2020-05-14 ENCOUNTER — Other Ambulatory Visit: Payer: Self-pay

## 2020-05-14 ENCOUNTER — Telehealth: Payer: Self-pay

## 2020-05-14 ENCOUNTER — Encounter: Payer: Self-pay | Admitting: Cardiology

## 2020-05-14 VITALS — BP 120/86 | HR 84 | Ht 70.0 in | Wt 180.0 lb

## 2020-05-14 DIAGNOSIS — I10 Essential (primary) hypertension: Secondary | ICD-10-CM

## 2020-05-14 DIAGNOSIS — I428 Other cardiomyopathies: Secondary | ICD-10-CM

## 2020-05-14 DIAGNOSIS — E7800 Pure hypercholesterolemia, unspecified: Secondary | ICD-10-CM

## 2020-05-14 NOTE — Progress Notes (Signed)
Cardiology Office Note:    Date:  05/14/2020   ID:  Nancy Barajas, DOB 07-18-1960, MRN [id number]  PCP:  McLean-Scocuzza, Nino Glow, MD  Cardiologist:  Kate Sable, MD  Electrophysiologist:  None   Referring MD: McLean-Scocuzza, Olivia Mackie *   Chief Complaint  Patient presents with  . Other    6 month follow up - Meds reviewed verbally with patient.     History of Present Illness:    Nancy Barajas is a 60 y.o. female with a hx of hypertension, NICM, initial EF 25-30%, improved to 40-45% on GDMT, diabetes, hyperlipidemia, CKD stage III, who presents for follow-up.    Being seen for nonischemic cardiomyopathy.  Medications titrated, feels well, blood pressure now controlled.  Denies edema, shortness of breath or chest pain.  Has a vocal cord issue, being seen by neurology, started on baclofen.  Feels okay from a cardiac perspective.  Prior notes Echo 03/20/2019 EF 25 to 30% Left heart catheter 04/2019 no evidence of CAD, normal coronary arteries Echo 09/2019 EF 40 to 45%.   Past Medical History:  Diagnosis Date  . Anxiety   . Asthma   . Depression   . Diabetes mellitus without complication ([place])   . History of kidney stones   . Hypertension   . Kidney stones   . Vertigo    when laying flat  . Vocal cord dysfunction    makes it hard for patient to talk and breathe    Past Surgical History:  Procedure Laterality Date  . CARDIAC CATHETERIZATION    . CYSTOSCOPY W/ RETROGRADES Right 09/27/2019   Procedure: CYSTOSCOPY WITH RETROGRADE PYELOGRAM;  Surgeon: Billey Co, MD;  Location: [facility] ORS;  Service: Urology;  Laterality: Right;  . CYSTOSCOPY/URETEROSCOPY/HOLMIUM LASER/STENT PLACEMENT Left 09/27/2019   Procedure: CYSTOSCOPY/URETEROSCOPY/HOLMIUM LASER/STENT PLACEMENT;  Surgeon: Billey Co, MD;  Location: [facility] ORS;  Service: Urology;  Laterality: Left;  . LITHOTRIPSY    . RIGHT/LEFT HEART CATH AND CORONARY ANGIOGRAPHY Bilateral 04/08/2019   Procedure:  RIGHT/LEFT HEART CATH AND CORONARY ANGIOGRAPHY;  Surgeon: Wellington Hampshire, MD;  Location: [facility];  Service: Cardiovascular;  Laterality: Bilateral;    Current Medications: Current Meds  Medication Sig  . baclofen (LIORESAL) 10 MG tablet Take 10 mg by mouth 2 (two) times daily.  . Baclofen 5 MG TABS Take 1 tablet (5 mg total) by mouth 2 (two) times daily for 14 days, THEN 2 tablets (10 mg total) 2 (two) times daily for 30 days.  . blood glucose meter kit and supplies Dispense based on patient and insurance preference. Use twice daily as directed. (FOR ICD-10 E10.9, E11.9).  . carvedilol (COREG) 25 MG tablet Take 1 tablet (25 mg total) by mouth 2 (two) times daily with a meal.  . Cholecalciferol (VITAMIN D-3) 125 MCG (5000 UT) TABS Take 1 tablet by mouth daily.  . Dulaglutide (TRULICITY) 1.5 TT/0.1XB SOPN Inject 1.5 mg into the skin once a week.  Marland Kitchen FARXIGA 10 MG TABS tablet Take 10 mg by mouth daily.  . Lancets MISC 1 Device by Does not apply route in the morning, at noon, and at bedtime. Dispense for onetouch ultra 2 machine ONLY  . LORazepam (ATIVAN) 0.5 MG tablet Take 0.5-1 tablets (0.25-0.5 mg total) by mouth daily as needed for anxiety.  . Multiple Vitamins-Minerals (CENTRUM SILVER 50+WOMEN PO) Take 1 tablet by mouth daily.  Glory Rosebush ULTRA test strip CHECK BLOOD SUGAR TWICE DAILY AS DIRECTED  . rosuvastatin (CRESTOR) 10 MG tablet Take  1 tablet (10 mg total) by mouth every evening.  . sacubitril-valsartan (ENTRESTO) 97-103 MG Take 1 tablet by mouth 2 (two) times daily.  . sertraline (ZOLOFT) 100 MG tablet Take 1.5 tablets (150 mg total) by mouth daily.  . sodium bicarbonate 650 MG tablet Take 1 tablet (650 mg total) by mouth 3 (three) times daily.  . sodium chloride (OCEAN) 0.65 % SOLN nasal spray Place 1 spray into both nostrils as needed for congestion.   Marland Kitchen spironolactone (ALDACTONE) 25 MG tablet Take 1 tablet (25 mg total) by mouth daily.     Allergies:   Penicillins    Social History   Socioeconomic History  . Marital status: Divorced    Spouse name: Not on file  . Number of children: 2  . Years of education: Not on file  . Highest education level: 9th grade  Occupational History  . Not on file  Tobacco Use  . Smoking status: Former Smoker    Packs/day: 1.00    Years: 10.00    Pack years: 10.00    Quit date: 01/03/1998    Years since quitting: 22.3  . Smokeless tobacco: Never Used  Vaping Use  . Vaping Use: Never used  Substance and Sexual Activity  . Alcohol use: Not Currently  . Drug use: Not on file    Comment: years ago  . Sexual activity: Not Currently  Other Topics Concern  . Not on file  Social History Narrative   Lives at home    2 daughters as of 04/04/19 62 y.o daughter lives with her (she works)   Patient is on disability.   Social Determinants of Health   Financial Resource Strain: Low Risk   . Difficulty of Paying Living Expenses: Not very hard  Food Insecurity: Food Insecurity Present  . Worried About Charity fundraiser in the Last Year: Sometimes true  . Ran Out of Food in the Last Year: Sometimes true  Transportation Needs: No Transportation Needs  . Lack of Transportation (Medical): No  . Lack of Transportation (Non-Medical): No  Physical Activity: Not on file  Stress: Stress Concern Present  . Feeling of Stress : To some extent  Social Connections: Not on file     Family History: The patient's family history includes Diabetes in her father and mother; Hypertension in her father and mother. There is no history of Breast cancer.  ROS:   Please see the history of present illness.     All other systems reviewed and are negative.  EKGs/Labs/Other Studies Reviewed:    The following studies were reviewed today:   EKG:  EKG is  ordered today.  The ekg ordered today demonstrates normal sinus rhythm, left bundle branch block  Recent Labs: 11/12/2019: TSH 0.85 02/19/2020: ALT 13; BUN 30; Creatinine, Ser 1.32;  Hemoglobin 13.5; Platelets 262.0; Potassium 4.3; Sodium 142  Recent Lipid Panel    Component Value Date/Time   CHOL 209 (H) 02/19/2020 1013   TRIG 89.0 02/19/2020 1013   HDL 92.10 02/19/2020 1013   CHOLHDL 2 02/19/2020 1013   VLDL 17.8 02/19/2020 1013   LDLCALC 100 (H) 02/19/2020 1013    Physical Exam:    VS:  BP 120/86 (BP Location: Left Arm, Patient Position: Sitting, Cuff Size: Normal)   Pulse 84   Ht '5\' 10"'  (1.778 m)   Wt 180 lb (81.6 kg)   LMP 01/03/2013   SpO2 96%   BMI 25.83 kg/m     Wt Readings from Last 3  Encounters:  05/14/20 180 lb (81.6 kg)  04/15/20 194 lb (88 kg)  02/19/20 191 lb (86.6 kg)     GEN:  Well nourished, well developed in no acute distress, obese HEENT: Normal NECK: No JVD; No carotid bruits LYMPHATICS: No lymphadenopathy CARDIAC: RRR, no murmurs, rubs, gallops RESPIRATORY:  Clear to auscultation without rales, wheezing or rhonchi  ABDOMEN: Soft, non-tender, non-distended MUSCULOSKELETAL:  No edema; No deformity  SKIN: Warm and dry NEUROLOGIC:  Alert and oriented x 3 PSYCHIATRIC:  Normal affect   ASSESSMENT:    1. NICM (nonischemic cardiomyopathy) ([place])   2. Primary hypertension   3. Pure hypercholesterolemia    PLAN:    In order of problems listed above:  1. Nonischemic cardiomyopathy, initial EF 25 to 30%.  Last EF 40%.  Continue Coreg, Entresto, Aldactone, Farxiga.  Plan repeat echocardiogram in 4 to 5 months. 2. History of hypertension, blood pressure is now controlled.  Continue Coreg 25 mg twice daily, Entresto, Aldactone 25 mg daily. 3. hyperlipidemia, continue Crestor.  Follow-up in 6 months after repeat echo.   This note was generated in part or whole with voice recognition software. Voice recognition is usually quite accurate but there are transcription errors that can and very often do occur. I apologize for any typographical errors that were not detected and corrected.  Medication Adjustments/Labs and Tests  Ordered: Current medicines are reviewed at length with the patient today.  Concerns regarding medicines are outlined above.  Orders Placed This Encounter  Procedures  . EKG 12-Lead  . ECHOCARDIOGRAM COMPLETE   No orders of the defined types were placed in this encounter.   Patient Instructions  Medication Instructions:  Your physician recommends that you continue on your current medications as directed. Please refer to the Current Medication list given to you today.  *If you need a refill on your cardiac medications before your next appointment, please call your pharmacy*   Lab Work: None ordered If you have labs (blood work) drawn today and your tests are completely normal, you will receive your results only by: Marland Kitchen MyChart Message (if you have MyChart) OR . A paper copy in the mail If you have any lab test that is abnormal or we need to change your treatment, we will call you to review the results.   Testing/Procedures:  1.  Your physician has requested that you have an echocardiogram in 4-5 months. Echocardiography is a painless test that uses sound waves to create images of your heart. It provides your doctor with information about the size and shape of your heart and how well your heart's chambers and valves are working. This procedure takes approximately one hour. There are no restrictions for this procedure.    Follow-Up: At [facility], you and your health needs are our priority.  As part of our continuing mission to provide you with exceptional heart care, we have created designated Provider Care Teams.  These Care Teams include your primary Cardiologist (physician) and Advanced Practice Providers (APPs -  Physician Assistants and Nurse Practitioners) who all work together to provide you with the care you need, when you need it.  We recommend signing up for the patient portal called "MyChart".  Sign up information is provided on this After Visit Summary.  MyChart is used  to connect with patients for Virtual Visits (Telemedicine).  Patients are able to view lab/test results, encounter notes, upcoming appointments, etc.  Non-urgent messages can be sent to your provider as well.   To learn  more about what you can do with MyChart, go to [email or web address].    Your next appointment:   6 month(s)  The format for your next appointment:   In Person  Provider:   Kate Sable, MD   Other Instructions      Signed, Kate Sable, MD  05/14/2020 12:59 PM    [place]

## 2020-05-14 NOTE — Patient Instructions (Signed)
Medication Instructions:  Your physician recommends that you continue on your current medications as directed. Please refer to the Current Medication list given to you today.  *If you need a refill on your cardiac medications before your next appointment, please call your pharmacy*   Lab Work: None ordered If you have labs (blood work) drawn today and your tests are completely normal, you will receive your results only by: Marland Kitchen MyChart Message (if you have MyChart) OR . A paper copy in the mail If you have any lab test that is abnormal or we need to change your treatment, we will call you to review the results.   Testing/Procedures:  1.  Your physician has requested that you have an echocardiogram in 4-5 months. Echocardiography is a painless test that uses sound waves to create images of your heart. It provides your doctor with information about the size and shape of your heart and how well your heart's chambers and valves are working. This procedure takes approximately one hour. There are no restrictions for this procedure.    Follow-Up: At [facility], you and your health needs are our priority.  As part of our continuing mission to provide you with exceptional heart care, we have created designated Provider Care Teams.  These Care Teams include your primary Cardiologist (physician) and Advanced Practice Providers (APPs -  Physician Assistants and Nurse Practitioners) who all work together to provide you with the care you need, when you need it.  We recommend signing up for the patient portal called "MyChart".  Sign up information is provided on this After Visit Summary.  MyChart is used to connect with patients for Virtual Visits (Telemedicine).  Patients are able to view lab/test results, encounter notes, upcoming appointments, etc.  Non-urgent messages can be sent to your provider as well.   To learn more about what you can do with MyChart, go to [email or web address].    Your next  appointment:   6 month(s)  The format for your next appointment:   In Person  Provider:   Kate Sable, MD   Other Instructions

## 2020-05-14 NOTE — Telephone Encounter (Signed)
Patient is aware of date/time of covid test prior to PFT.  

## 2020-05-18 ENCOUNTER — Other Ambulatory Visit: Payer: Self-pay

## 2020-05-18 ENCOUNTER — Ambulatory Visit
Admission: RE | Admit: 2020-05-18 | Discharge: 2020-05-18 | Disposition: A | Discharge status: Home / Self Care | Payer: Medicare HMO | Source: Ambulatory Visit | Attending: Neurology | Admitting: Neurology

## 2020-05-18 ENCOUNTER — Other Ambulatory Visit
Admission: RE | Admit: 2020-05-18 | Discharge: 2020-05-18 | Disposition: A | Discharge status: Home / Self Care | Payer: Medicare HMO | Source: Ambulatory Visit | Attending: Pulmonary Disease | Admitting: Pulmonary Disease

## 2020-05-18 DIAGNOSIS — J383 Other diseases of vocal cords: Secondary | ICD-10-CM | POA: Insufficient documentation

## 2020-05-18 DIAGNOSIS — Z01812 Encounter for preprocedural laboratory examination: Secondary | ICD-10-CM | POA: Diagnosis not present

## 2020-05-18 DIAGNOSIS — Z20822 Contact with and (suspected) exposure to covid-19: Secondary | ICD-10-CM | POA: Diagnosis not present

## 2020-05-18 LAB — SARS CORONAVIRUS 2 (TAT 6-24 HRS): SARS Coronavirus 2: NEGATIVE

## 2020-05-18 MED ORDER — GADOBUTROL 1 MMOL/ML IV SOLN: 7.5000 mL | Freq: Once | INTRAVENOUS | Status: DC | PRN

## 2020-05-19 ENCOUNTER — Ambulatory Visit: Payer: Medicare HMO | Attending: Pulmonary Disease

## 2020-05-19 DIAGNOSIS — J383 Other diseases of vocal cords: Secondary | ICD-10-CM | POA: Diagnosis not present

## 2020-05-19 DIAGNOSIS — R0602 Shortness of breath: Secondary | ICD-10-CM | POA: Insufficient documentation

## 2020-05-19 DIAGNOSIS — Z87891 Personal history of nicotine dependence: Secondary | ICD-10-CM | POA: Insufficient documentation

## 2020-05-19 MED ORDER — ALBUTEROL SULFATE (2.5 MG/3ML) 0.083% IN NEBU
2.5000 mg | INHALATION_SOLUTION | Freq: Once | RESPIRATORY_TRACT | Status: AC
  Filled 2020-05-19: qty 3

## 2020-05-22 ENCOUNTER — Ambulatory Visit (INDEPENDENT_AMBULATORY_CARE_PROVIDER_SITE_OTHER): Payer: Medicare HMO | Admitting: Psychology

## 2020-05-22 DIAGNOSIS — F418 Other specified anxiety disorders: Secondary | ICD-10-CM | POA: Diagnosis not present

## 2020-05-22 DIAGNOSIS — R69 Illness, unspecified: Secondary | ICD-10-CM | POA: Diagnosis not present

## 2020-05-27 ENCOUNTER — Ambulatory Visit (INDEPENDENT_AMBULATORY_CARE_PROVIDER_SITE_OTHER): Payer: Medicare HMO | Admitting: *Deleted

## 2020-05-27 DIAGNOSIS — I5020 Unspecified systolic (congestive) heart failure: Secondary | ICD-10-CM | POA: Diagnosis not present

## 2020-05-27 DIAGNOSIS — I10 Essential (primary) hypertension: Secondary | ICD-10-CM

## 2020-05-27 DIAGNOSIS — E119 Type 2 diabetes mellitus without complications: Secondary | ICD-10-CM | POA: Diagnosis not present

## 2020-05-27 NOTE — Patient Instructions (Signed)
Visit Information  PATIENT GOALS: Goals Addressed            This Visit's Progress   . (CCM RN) Find Help in My Community   Not on track    Timeframe:  Long-Range Goal Priority:  Medium Start Date:  12/11/2019                           Expected End Date:  08/01/2020         Follow Up Date:  06/22/20                . Continue to work with Clinical research associate for making arrangements for dental bill . Contact Medicaid or Social Services to verify medicaid benefit showing in system   Why is this important?    Knowing how and where to find help for yourself or family in your neighborhood and community is an important skill.   You will want to take some steps to learn how.    Notes:     Marland Kitchen (CCM RN) Monitor and Manage My Blood Sugar, Blood Pressure, and Daily Weights   On track    Timeframe:  Long-Range Goal Priority:  Medium Start Date:  10/07/2019                           Expected End Date:  11/02/20         Follow Up:  06/22/20             . Check blood sugar at least twice a day (before meals or 2 hours post eating) . Check blood sugar if I feel it is too high or too low . Enter blood sugar readings and medication into daily log and take log to provider appointments . Continue to monitor weights daily and keeping in log . Weigh self first thing in the mornings after voiding . Notify provider for increase weight 3 pounds overnight of 5 pounds within 5 days or lower extremity swelling . Continue to monitor blood pressures daily, noting if blood pressures elevated if prior to taking medications and to recheck after taking morning medications  Why is this important?   Checking your blood sugar at home helps to keep it from getting very high or very low.  Writing the results in a diary or log helps the doctor know how to care for you.  Your blood sugar log should have the time, date and the results.  Also, write down the amount of insulin or other medicine that you take.  Other  information, like what you ate, exercise done and how you were feeling, will also be helpful.     Notes:      Marland Kitchen (CCM RN) Set My Target A1C-Diabetes Type 2   On track    Timeframe:  Long-Range Goal Priority:  Medium Start Date:   12/11/2019                          Expected End Date:  11/02/2020      Follow Up Date:  06/22/20                   . Set target A1C; current A1C decreased to 6.2(02/19/20) . Discuss with PCP goal Hgb A1C    Why is this important?    Your target A1C is decided together by you and your  doctor.   It is based on several things like your age and other health issues.    Notes:        Patient verbalizes understanding of instructions provided today and agrees to view in [place].   The care management team will reach out to the patient again over the next 45 business days.   Hubert Azure RN, MSN RN Care Management Coordinator [place] [phone] [email or web address]

## 2020-05-27 NOTE — Chronic Care Management (AMB) (Signed)
Chronic Care Management   CCM RN Visit Note  05/27/2020 Name: Nancy Barajas MRN: [id number] DOB: 05-May-1960  Subjective: Nancy Barajas is a 60 y.o. year old female who is a primary care patient of McLean-Scocuzza, Nino Glow, MD. The care management team was consulted for assistance with disease management and care coordination needs.    Engaged with patient by telephone for follow up visit in response to provider referral for case management and/or care coordination services.   Consent to Services:  The patient was given information about Chronic Care Management services, agreed to services, and gave verbal consent prior to initiation of services.  Please see initial visit note for detailed documentation.   Patient agreed to services and verbal consent obtained.   Assessment: Review of patient past medical history, allergies, medications, health status, including review of consultants reports, laboratory and other test data, was performed as part of comprehensive evaluation and provision of chronic care management services.   SDOH (Social Determinants of Health) assessments and interventions performed:    CCM Care Plan  Allergies  Allergen Reactions  . Penicillins Rash    Did it involve swelling of the face/tongue/throat, SOB, or low BP? No Did it involve sudden or severe rash/hives, skin peeling, or any reaction on the inside of your mouth or nose? No Did you need to seek medical attention at a hospital or doctor's office? No When did it last happen?Childhood If all above answers are "NO", may proceed with cephalosporin use.    Outpatient Encounter Medications as of 05/27/2020  Medication Sig  . baclofen (LIORESAL) 10 MG tablet Take 10 mg by mouth 2 (two) times daily.  . carvedilol (COREG) 25 MG tablet Take 1 tablet (25 mg total) by mouth 2 (two) times daily with a meal.  . Cholecalciferol (VITAMIN D-3) 125 MCG (5000 UT) TABS Take 1 tablet by mouth daily.  .  Dulaglutide (TRULICITY) 1.5 CN/4.7SJ SOPN Inject 1.5 mg into the skin once a week.  Marland Kitchen FARXIGA 10 MG TABS tablet Take 10 mg by mouth daily.  Marland Kitchen LORazepam (ATIVAN) 0.5 MG tablet Take 0.5-1 tablets (0.25-0.5 mg total) by mouth daily as needed for anxiety.  . rosuvastatin (CRESTOR) 10 MG tablet Take 1 tablet (10 mg total) by mouth every evening.  . sacubitril-valsartan (ENTRESTO) 97-103 MG Take 1 tablet by mouth 2 (two) times daily.  . sertraline (ZOLOFT) 100 MG tablet Take 1.5 tablets (150 mg total) by mouth daily.  . sodium chloride (OCEAN) 0.65 % SOLN nasal spray Place 1 spray into both nostrils as needed for congestion.   Marland Kitchen spironolactone (ALDACTONE) 25 MG tablet Take 1 tablet (25 mg total) by mouth daily.  . Baclofen 5 MG TABS Take 1 tablet (5 mg total) by mouth 2 (two) times daily for 14 days, THEN 2 tablets (10 mg total) 2 (two) times daily for 30 days.  . blood glucose meter kit and supplies Dispense based on patient and insurance preference. Use twice daily as directed. (FOR ICD-10 E10.9, E11.9).  . Lancets MISC 1 Device by Does not apply route in the morning, at noon, and at bedtime. Dispense for onetouch ultra 2 machine ONLY  . Multiple Vitamins-Minerals (CENTRUM SILVER 50+WOMEN PO) Take 1 tablet by mouth daily.  Glory Rosebush ULTRA test strip CHECK BLOOD SUGAR TWICE DAILY AS DIRECTED  . sodium bicarbonate 650 MG tablet Take 1 tablet (650 mg total) by mouth 3 (three) times daily.   Facility-Administered Encounter Medications as of 05/27/2020  Medication  . albuterol (PROVENTIL) (  2.5 MG/3ML) 0.083% nebulizer solution 2.5 mg    Patient Active Problem List   Diagnosis Date Noted  . Anxiety 02/19/2020  . Obstructive uropathy 08/01/2019  . Gallstones 07/28/2019  . Umbilical hernia 95/28/4132  . Hematuria 07/15/2019  . Hypertension associated with diabetes ([place]) 07/11/2019  . Vitamin D deficiency 07/11/2019  . Hiatal hernia 07/11/2019  . Obesity (BMI 30.0-34.9) 07/11/2019  . Heart failure  with reduced ejection fraction ([place])   . GAD (generalized anxiety disorder) 04/06/2019  . Proteinuria 03/04/2019  . Aortic atherosclerosis ([place]) 03/04/2019  . Thyroid nodule 03/04/2019  . Kidney cysts 03/04/2019  . Hemangioma of liver 03/04/2019  . Sinusitis 03/04/2019  . Kidney stones   . CKD stage 3 secondary to diabetes ([place]) 08/22/2018  . Laryngeal spasm 05/07/2018  . Muscle tension dysphonia 05/07/2018  . Shortness of breath 01/22/2018  . Uncontrolled hypertension 01/22/2018  . Type 2 diabetes mellitus without complication, without long-term current use of insulin ([place]) 01/22/2018  . Mixed hyperlipidemia 01/22/2018  . Mild persistent asthma 01/22/2018    Conditions to be addressed/monitored:CHF, HTN and DMII  Care Plan : Diabetes/Heart Failure/Hypertension  Updates made by [name], RN since 05/27/2020 12:00 AM  Problem: Management of Diabetes/CHF/HTN   Priority: Medium  Long-Range Goal: Management of Diabetes/CHF/HTN   Start Date: 12/11/2019  Expected End Date: 11/02/2020  This Visit's Progress: On track  Recent Progress: On track  Priority: Medium  Objective:  Lab Results  Component Value Date   HGBA1C 6.2 02/19/2020 .   Lab Results  Component Value Date   CREATININE 1.32 (H) 02/19/2020   CREATININE 1.19 11/12/2019   CREATININE 1.21 (H) 05/14/2019   Current Barriers:  Marland Kitchen Knowledge deficit related to self care management of chronic medical conditions; Continues to weigh daily (189, 187-190), monitor blood pressures daily (110-150/70-80's), and check blood sugars about twice a day (130, 79-120's).  Has been very good with keeping vital sign log. Denies any lower extremity edema and denies any shortness of breath.  Continues to have episodes with voice and has been taking Ativan as needed with some relief.  Has seen pulmonologist and Neurologist.  Initiated on Baclofen; unable to tolerate MRI . Social issues and knowledge deficits affecting self care management  of chronic conditions: Diabetes, Hypertension, Heart Failure  Case Manager Clinical Goal(s):  Marland Kitchen Collaboration with McLean-Scocuzza, Nino Glow, MD regarding development and update of comprehensive plan of care as evidenced by provider attestation and co-signature . Inter-disciplinary care team collaboration (see longitudinal plan of care) . Over the next 120days, patient will demonstrate improved adherence to prescribed treatment plan for diabetes self care/management as evidenced by: compliance with taking Trulicity-states she has been taking this medication as prescribed . Encourage lifestyle changes, such as increased intake of plant-based foods, stress reduction, consistent physical activity and medication compliance to prevent long-term complications and chronic disease.   . Encourage daily monitoring and recording of CBG, blood pressure, and weights . Discuss importance of adherence to ADA/ carb modified Heart Healthy diet . Discuss importance of adherence to prescribed medication regimen Case Manager Interventions:   General . Reviewed medications with patient and discussed importance of medication adherence; discussed taking prescribed anxiety medication to help with voice and increase anxiety episodes . Discussed plans with patient for ongoing care management follow up and provided patient with direct contact information for care management team . Discussed Medicaid still showing as insurance option and encouraged patient to contact Medicaid office to speak with worker to confirm .  Encouraged to continue to attend therapy/counseling sessions to help with depression . Discussed using deep breathing exercises when feeling anxious . Encouraged to keep and attend scheduled medical appointments . Congratulated patient on keeping log of vitals for easy provider review . Assisted with rescheduling MRI for patient (need premed or open) Diabetes . Advised patient, providing education and rationale,  to check cbg at least twice a day and record, calling primary care provider or CCM Embedded Pharmacist for findings outside established parameters.   . Congratulated patient on current CBG trends and encouraged to review what her goal A1C should be with provider . Reviewed current glucose readings and discussed ways to keep them within range HTN . Encouraged to continue to monitor blood pressures daily, and to notify provider for blood pressures elevations or lows CHF . Continue to monitor weights daily, first thing in the morning when she wakens after voiding and to notify provider for 3 pound weight gain overnight or 5 pounds within 1 week  . Encouraged to monitor feet, legs, and abdomen for swelling Patient Goals/Self-Care Activities Over the next 120 days, patient will:  General . Continue to work with Clinical research associate for making arrangements for dental bill . Contact Medicaid or Social Services to verify medicaid benefit showing in system Diabetes . Check blood sugar at least twice a day (before meals or 2 hours post eating) . Check blood sugar if I feel it is too high or too low . Enter blood sugar readings and medication into daily log and take log to provider appointments HTN . Continue to monitor blood pressures daily, noting if blood pressures elevated if prior to taking medications and to recheck after taking morning medications CHF . Continue to monitor weights daily and keeping in log . Weigh self first thing in the mornings after voiding . Notify provider for increase weight 3 pounds overnight of 5 pounds within 5 days or lower extremity swelling Follow Up Plan: The care management team will reach out to the patient again over the next 45 business days.      Plan:The care management team will reach out to the patient again over the next 45 business days.  Hubert Azure RN, MSN RN Care Management Coordinator [place] [phone] [email or web address]

## 2020-06-02 ENCOUNTER — Telehealth: Payer: Self-pay | Admitting: Pharmacist

## 2020-06-02 ENCOUNTER — Telehealth: Payer: Medicare HMO
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
## 2020-06-09 ENCOUNTER — Ambulatory Visit (INDEPENDENT_AMBULATORY_CARE_PROVIDER_SITE_OTHER): Payer: Medicare HMO | Admitting: Psychology

## 2020-06-09 DIAGNOSIS — F418 Other specified anxiety disorders: Secondary | ICD-10-CM | POA: Diagnosis not present

## 2020-06-09 DIAGNOSIS — R69 Illness, unspecified: Secondary | ICD-10-CM | POA: Diagnosis not present

## 2020-06-10 ENCOUNTER — Ambulatory Visit: Payer: Medicare HMO | Admitting: Pulmonary Disease

## 2020-06-12 ENCOUNTER — Ambulatory Visit: Payer: Medicare HMO

## 2020-06-19 ENCOUNTER — Telehealth: Payer: Self-pay | Admitting: Internal Medicine

## 2020-06-19 ENCOUNTER — Ambulatory Visit: Payer: Medicare HMO | Admitting: Internal Medicine

## 2020-06-19 NOTE — Telephone Encounter (Signed)
Transferred to Access   Pt called to cancel her appt for today. I tried to make the appt a VV.  Pt sounded like she was gasping for air and throwing up. She stated that her anxiety was high and she was having vocal issues that was the only info I could get

## 2020-06-19 NOTE — Telephone Encounter (Signed)
Providing Access Nurse Documentation.   

## 2020-06-19 NOTE — Telephone Encounter (Signed)
For your information  Will await documentation from access nurse

## 2020-06-19 NOTE — Telephone Encounter (Signed)
Patient was rescheduled to be seen 07/02/20

## 2020-06-22 ENCOUNTER — Ambulatory Visit (INDEPENDENT_AMBULATORY_CARE_PROVIDER_SITE_OTHER): Payer: Medicare HMO | Admitting: *Deleted

## 2020-06-22 ENCOUNTER — Ambulatory Visit: Admission: RE | Admit: 2020-06-22 | Payer: Medicare HMO | Source: Ambulatory Visit

## 2020-06-22 DIAGNOSIS — I10 Essential (primary) hypertension: Secondary | ICD-10-CM

## 2020-06-22 DIAGNOSIS — I509 Heart failure, unspecified: Secondary | ICD-10-CM | POA: Diagnosis not present

## 2020-06-22 DIAGNOSIS — E119 Type 2 diabetes mellitus without complications: Secondary | ICD-10-CM

## 2020-06-22 NOTE — Telephone Encounter (Signed)
Did pt go to the ED for sob/anxiety? Can see reach out to her therapist for anxiety?  Does she see psychiatry as well if not give info about [facility] to call and make appt therapy and psychiatry please  If sob respiratory advise her to call [facility] in [place] she is established

## 2020-06-22 NOTE — Telephone Encounter (Signed)
Patient informed and verbalized understanding.  Given [facility] information for psychiatry as she states the previous person she saw retired. She has reached out to her therapist.

## 2020-06-22 NOTE — Chronic Care Management (AMB) (Signed)
  Care Management   Follow Up Note   06/22/2020 Name: Laysha Childers MRN: [id number] DOB: 1960/02/17   Referred by: McLean-Scocuzza, Nino Glow, MD Reason for referral : Chronic Care Management (DM, HTN)   Successful telephone outreach to patient for CCM follow up.  Patient initially verbalizes her anxiety about her MRI scheduled for today, but states she does not have a ride and unable to drive self due to planned medications.   Provided patient with number to [facility].  Another outreach to patient to verify transportation to scheduled MRI and patient stated she had rescheduled procedure for Sunday, 07/28/20 when she was sure she could get ride to procedure.  Continues to report she has been having more anxiety lately.  Continues to speak with her therapist, but has not spoken with her psychiatrist, and she would like to.  Has obtained name and number to new psychiatrist from PCP office and would like to contact them.  RNCM will reschedule follow up per patient's request.  Follow Up Plan: The care management team will reach out to the patient again over the next 20 business days.   Hubert Azure RN, MSN RN Care Management Coordinator [place] [phone] [email or web address]

## 2020-06-23 ENCOUNTER — Telehealth: Payer: Self-pay

## 2020-06-23 NOTE — Telephone Encounter (Signed)
Is in need of medication to have mri completed due to panic attack.

## 2020-06-23 NOTE — Telephone Encounter (Signed)
Duplicate Access Nurse Scan. See 06/19/20 telephone encounter

## 2020-06-23 NOTE — Telephone Encounter (Signed)
See notes from Friday.  Need to clarify what is needed.  Does she see psych?  Also, need to clarify what she is calling for.  Is she calling because she needs something for MRI or is she having increased anxiety in general?

## 2020-06-28 ENCOUNTER — Ambulatory Visit: Admission: RE | Admit: 2020-06-28 | Payer: Medicare HMO | Source: Ambulatory Visit

## 2020-07-01 ENCOUNTER — Ambulatory Visit: Payer: Medicare Other | Admitting: Urology

## 2020-07-02 ENCOUNTER — Ambulatory Visit (INDEPENDENT_AMBULATORY_CARE_PROVIDER_SITE_OTHER): Payer: Medicare HMO | Admitting: Internal Medicine

## 2020-07-02 ENCOUNTER — Encounter: Payer: Self-pay | Admitting: Internal Medicine

## 2020-07-02 ENCOUNTER — Telehealth: Payer: Self-pay | Admitting: Internal Medicine

## 2020-07-02 ENCOUNTER — Other Ambulatory Visit: Payer: Self-pay

## 2020-07-02 VITALS — BP 150/96 | HR 83 | Temp 98.6°F | Ht 70.0 in | Wt 186.4 lb

## 2020-07-02 DIAGNOSIS — L821 Other seborrheic keratosis: Secondary | ICD-10-CM | POA: Diagnosis not present

## 2020-07-02 DIAGNOSIS — F444 Conversion disorder with motor symptom or deficit: Secondary | ICD-10-CM | POA: Diagnosis not present

## 2020-07-02 DIAGNOSIS — Z23 Encounter for immunization: Secondary | ICD-10-CM | POA: Diagnosis not present

## 2020-07-02 DIAGNOSIS — R498 Other voice and resonance disorders: Secondary | ICD-10-CM

## 2020-07-02 DIAGNOSIS — R69 Illness, unspecified: Secondary | ICD-10-CM | POA: Diagnosis not present

## 2020-07-02 DIAGNOSIS — Z1329 Encounter for screening for other suspected endocrine disorder: Secondary | ICD-10-CM

## 2020-07-02 DIAGNOSIS — I10 Essential (primary) hypertension: Secondary | ICD-10-CM

## 2020-07-02 DIAGNOSIS — F32A Depression, unspecified: Secondary | ICD-10-CM

## 2020-07-02 DIAGNOSIS — F419 Anxiety disorder, unspecified: Secondary | ICD-10-CM | POA: Diagnosis not present

## 2020-07-02 DIAGNOSIS — L918 Other hypertrophic disorders of the skin: Secondary | ICD-10-CM

## 2020-07-02 DIAGNOSIS — E1159 Type 2 diabetes mellitus with other circulatory complications: Secondary | ICD-10-CM | POA: Diagnosis not present

## 2020-07-02 DIAGNOSIS — E559 Vitamin D deficiency, unspecified: Secondary | ICD-10-CM

## 2020-07-02 DIAGNOSIS — I152 Hypertension secondary to endocrine disorders: Secondary | ICD-10-CM | POA: Diagnosis not present

## 2020-07-02 MED ORDER — SERTRALINE HCL 100 MG PO TABS: 100.0000 mg | ORAL_TABLET | Freq: Every day | ORAL | 3 refills | Status: DC

## 2020-07-02 MED ORDER — TETANUS-DIPHTH-ACELL PERTUSSIS 5-2.5-18.5 LF-MCG/0.5 IM SUSP: 0.5000 mL | Freq: Once | INTRAMUSCULAR | 0 refills | Status: AC

## 2020-07-02 MED ORDER — LORAZEPAM 0.5 MG PO TABS: 0.2500 mg | ORAL_TABLET | Freq: Every day | ORAL | 5 refills | Status: DC | PRN

## 2020-07-02 MED ORDER — LANCETS MISC: 1.0000 | Freq: Three times a day (TID) | 11 refills | Status: DC

## 2020-07-02 MED ORDER — SHINGRIX 50 MCG/0.5ML IM SUSR: 0.5000 mL | Freq: Once | INTRAMUSCULAR | 1 refills | Status: AC

## 2020-07-02 MED ORDER — CARVEDILOL 25 MG PO TABS: 25.0000 mg | ORAL_TABLET | Freq: Two times a day (BID) | ORAL | 3 refills | Status: DC

## 2020-07-02 NOTE — Progress Notes (Signed)
Chief Complaint  Patient presents with   Follow-up   F/u  1. Stable systolic Chf/htn with BP log and home readings 118-150s mostly 130s-140s/80-90s on coreg 25 mg bid, entresto 97-103 bid, spironolactone 25 mg qd at one pt on norvasc 10 mg qd but stopped due to htn disc her case with Dr. Janey Genta and he is agreeable to add back norvasc 5 mg qd also on farxiga 10 mg qd  2. Ckd 3 on farxiga 10 mg qd  3. Anxiety and mood better on ativan 0.25-0.5 qd prn and helping had to reduce zoloft 150 to 100 mg felt too drugged  4. Dm 2 C3J 6.2 on trulicity 1.5 weekly and farxiga 10 mg qd  5. C/o skin tags and darker area right lower leg will me when know when ready to see dermatology in the future    Review of Systems  Constitutional:  Negative for weight loss.  HENT:  Negative for hearing loss.   Eyes:  Negative for blurred vision.  Respiratory:  Negative for shortness of breath.   Cardiovascular:  Negative for chest pain.  Gastrointestinal:  Negative for abdominal pain.  Musculoskeletal:  Negative for falls and joint pain.  Skin:  Negative for rash.  Neurological:  Negative for headaches.  Psychiatric/Behavioral:  Negative for depression. The patient is not nervous/anxious.   Past Medical History:  Diagnosis Date   Anxiety    Asthma    Depression    Diabetes mellitus without complication ([place])    History of kidney stones    Hypertension    Kidney stones    Vertigo    when laying flat   Vocal cord dysfunction    makes it hard for patient to talk and breathe   Past Surgical History:  Procedure Laterality Date   CARDIAC CATHETERIZATION     CYSTOSCOPY W/ RETROGRADES Right 09/27/2019   Procedure: CYSTOSCOPY WITH RETROGRADE PYELOGRAM;  Surgeon: Billey Co, MD;  Location: [facility];  Service: Urology;  Laterality: Right;   CYSTOSCOPY/URETEROSCOPY/HOLMIUM LASER/STENT PLACEMENT Left 09/27/2019   Procedure: CYSTOSCOPY/URETEROSCOPY/HOLMIUM LASER/STENT PLACEMENT;  Surgeon: Billey Co, MD;   Location: [facility];  Service: Urology;  Laterality: Left;   LITHOTRIPSY     RIGHT/LEFT HEART CATH AND CORONARY ANGIOGRAPHY Bilateral 04/08/2019   Procedure: RIGHT/LEFT HEART CATH AND CORONARY ANGIOGRAPHY;  Surgeon: Wellington Hampshire, MD;  Location: [facility];  Service: Cardiovascular;  Laterality: Bilateral;   Family History  Problem Relation Age of Onset   Hypertension Mother    Diabetes Mother    Hypertension Father    Diabetes Father    Breast cancer Neg Hx    Social History   Socioeconomic History   Marital status: Divorced    Spouse name: Not on file   Number of children: 2   Years of education: Not on file   Highest education level: 9th grade  Occupational History   Not on file  Tobacco Use   Smoking status: Former    Packs/day: 1.00    Years: 10.00    Pack years: 10.00    Types: Cigarettes    Quit date: 01/03/1998    Years since quitting: 22.5   Smokeless tobacco: Never  Vaping Use   Vaping Use: Never used  Substance and Sexual Activity   Alcohol use: Not Currently   Drug use: Not on file    Comment: years ago   Sexual activity: Not Currently  Other Topics Concern   Not on file  Social History  Narrative   Lives at home    2 daughters as of 04/04/19 28 y.o daughter lives with her (she works)   Patient is on disability.   Social Determinants of Health   Financial Resource Strain: Low Risk    Difficulty of Paying Living Expenses: Not very hard  Food Insecurity: Food Insecurity Present   Worried About [employer] in the Last Year: Sometimes true   Ran Out of Food in the Last Year: Sometimes true  Transportation Needs: No Transportation Needs   Lack of Transportation (Medical): No   Lack of Transportation (Non-Medical): No  Physical Activity: Not on file  Stress: Stress Concern Present   Feeling of Stress : To some extent  Social Connections: Not on file  Intimate Partner Violence: Not At Risk   Fear of Current or Ex-Partner: No    Emotionally Abused: No   Physically Abused: No   Sexually Abused: No   Current Meds  Medication Sig   baclofen (LIORESAL) 10 MG tablet Take 10 mg by mouth 2 (two) times daily.   blood glucose meter kit and supplies Dispense based on patient and insurance preference. Use twice daily as directed. (FOR ICD-10 E10.9, E11.9).   Cholecalciferol (VITAMIN D-3) 125 MCG (5000 UT) TABS Take 1 tablet by mouth daily.   Dulaglutide (TRULICITY) 1.5 FH/5.4TG SOPN Inject 1.5 mg into the skin once a week.   FARXIGA 10 MG TABS tablet Take 10 mg by mouth daily.   Multiple Vitamins-Minerals (CENTRUM SILVER 50+WOMEN PO) Take 1 tablet by mouth daily.   ONETOUCH ULTRA test strip CHECK BLOOD SUGAR TWICE DAILY AS DIRECTED   rosuvastatin (CRESTOR) 10 MG tablet Take 1 tablet (10 mg total) by mouth every evening.   sacubitril-valsartan (ENTRESTO) 97-103 MG Take 1 tablet by mouth 2 (two) times daily.   sodium bicarbonate 650 MG tablet Take 1 tablet (650 mg total) by mouth 3 (three) times daily.   sodium chloride (OCEAN) 0.65 % SOLN nasal spray Place 1 spray into both nostrils as needed for congestion.    spironolactone (ALDACTONE) 25 MG tablet Take 1 tablet (25 mg total) by mouth daily.   Tdap (BOOSTRIX) 5-2.5-18.5 LF-MCG/0.5 injection Inject 0.5 mLs into the muscle once for 1 dose.   Zoster Vaccine Adjuvanted [facility]) injection Inject 0.5 mLs into the muscle once for 1 dose. X 2 doses   [DISCONTINUED] carvedilol (COREG) 25 MG tablet Take 1 tablet (25 mg total) by mouth 2 (two) times daily with a meal.   [DISCONTINUED] Lancets MISC 1 Device by Does not apply route in the morning, at noon, and at bedtime. Dispense for onetouch ultra 2 machine ONLY   [DISCONTINUED] LORazepam (ATIVAN) 0.5 MG tablet Take 0.5-1 tablets (0.25-0.5 mg total) by mouth daily as needed for anxiety.   [DISCONTINUED] sertraline (ZOLOFT) 100 MG tablet Take 1.5 tablets (150 mg total) by mouth daily.   Allergies  Allergen Reactions   Penicillins  Rash    Did it involve swelling of the face/tongue/throat, SOB, or low BP? No Did it involve sudden or severe rash/hives, skin peeling, or any reaction on the inside of your mouth or nose? No Did you need to seek medical attention at a hospital or doctor's office? No When did it last happen?      Childhood If all above answers are "NO", may proceed with cephalosporin use.   Recent Results (from the past 2160 hour(s))  SARS CORONAVIRUS 2 (TAT 6-24 HRS) Nasopharyngeal Nasopharyngeal Swab     Status:  None   Collection Time: 05/18/20  8:10 AM   Specimen: Nasopharyngeal Swab  Result Value Ref Range   SARS Coronavirus 2 NEGATIVE NEGATIVE    Comment: (NOTE) SARS-CoV-2 target nucleic acids are NOT DETECTED.  The SARS-CoV-2 RNA is generally detectable in upper and lower respiratory specimens during the acute phase of infection. Negative results do not preclude SARS-CoV-2 infection, do not rule out co-infections with other pathogens, and should not be used as the sole basis for treatment or other patient management decisions. Negative results must be combined with clinical observations, patient history, and epidemiological information. The expected result is Negative.  Fact Sheet for Patients: SugarRoll.be  Fact Sheet for Healthcare Providers: https://www.woods-mathews.com/  This test is not yet approved or cleared by the [place] FDA and  has been authorized for detection and/or diagnosis of SARS-CoV-2 by FDA under an Emergency Use Authorization (EUA). This EUA will remain  in effect (meaning this test can be used) for the duration of the COVID-19 declaration under Se ction 564(b)(1) of the Act, 21 U.S.C. section 360bbb-3(b)(1), unless the authorization is terminated or revoked sooner.  Performed at [facility], [place] [address], [place],  [address]    Objective  Body mass index is 26.75 kg/m. Wt Readings from Last 3  Encounters:  07/02/20 186 lb 6.4 oz (84.6 kg)  05/14/20 180 lb (81.6 kg)  04/15/20 194 lb (88 kg)   Temp Readings from Last 3 Encounters:  07/02/20 98.6 F (37 C) (Oral)  04/15/20 (!) 97.5 F (36.4 C) (Temporal)  02/19/20 98.2 F (36.8 C) (Oral)   BP Readings from Last 3 Encounters:  07/02/20 (!) 150/96  05/14/20 120/86  04/15/20 140/80   Pulse Readings from Last 3 Encounters:  07/02/20 83  05/14/20 84  04/15/20 82    Physical Exam Vitals and nursing note reviewed.  Constitutional:      Appearance: Normal appearance. She is well-developed, well-groomed and overweight.  HENT:     Head: Normocephalic and atraumatic.  Eyes:     Conjunctiva/sclera: Conjunctivae normal.     Pupils: Pupils are equal, round, and reactive to light.  Cardiovascular:     Rate and Rhythm: Normal rate and regular rhythm.     Heart sounds: Normal heart sounds. No murmur heard. Pulmonary:     Effort: Pulmonary effort is normal.     Breath sounds: Normal breath sounds.  Abdominal:     Tenderness: There is no abdominal tenderness.  Skin:    General: Skin is warm and dry.  Neurological:     General: No focal deficit present.     Mental Status: She is alert and oriented to person, place, and time. Mental status is at baseline.     Gait: Gait normal.  Psychiatric:        Attention and Perception: Attention and perception normal.        Mood and Affect: Mood and affect normal.        Speech: Speech normal.        Behavior: Behavior normal. Behavior is cooperative.        Thought Content: Thought content normal.        Cognition and Memory: Cognition and memory normal.        Judgment: Judgment normal.    Assessment  Plan  Anxiety and depression - Plan: sertraline (ZOLOFT) 100 MG tablet reduce from 150 mg qd prn ativan 0.25-0.5 qd prn  Essential hypertension variable and uncontrolled- Plan: carvedilol (COREG) 25 MG  tablet bid, entresto 97-103 bid, spironolactone 25 mg qd  Hypertension  associated with diabetes ([place]) - Plan: Lancets MISC, Comprehensive metabolic panel, Lipid panel, Hemoglobin A1c, CBC with Differential/Platelet Consider add back norvasc 2.5 or 5 mg qd   Trulicity 1.5 weekly, crestor 10 mg qhs   Functional voice disorder - Plan: LORazepam (ATIVAN) 0.5 MG tablet Baclofen helping   Vitamin D deficiency - Plan: Vitamin D (25 hydroxy)  Skin tag Seborrheic keratosis  -let me know when ready to see dermatology   HM Flu utd given utd  Tdap and shingrix consider given Rx 07/02/20 pna 23 vaccine utd  covid 2/2 had utd booster 01/13/20 (3/3)   mammo ordered 03/2020 negative  Pap 02/05/2018 negative Colonoscopy never had due to anxiety will do cologaurd ordered +07/29/19 + Referred colonoscopy she is not ready as of 07/02/20 will let us know when due to other health issues    Urology Dr. Diamantina Providence had kidney stone surgery 10/09/19 and stent removal 10/16/19 [facility] thyroid bx 10/16/19 bx negative [facility]     Provider: Dr. Olivia Mackie McLean-Scocuzza-Internal Medicine

## 2020-07-02 NOTE — Patient Instructions (Addendum)
Pfizer vaccine due  Let me know when ready for colonoscopy and we will refer  Let me know when ready for dermatology referral  Urology Dr.  Diamantina Providence MD Physician    Primary Contact Information  Phone Fax E-mail Address  [phone] Not available [place] [address]     Specialties        Cardiology #  MD Physician    Primary Contact Information  Phone Fax E-mail Address  [phone] Not available [place] [address]     Specialties       Seborrheic Keratosis A seborrheic keratosis is a common, noncancerous (benign) skin growth. These growths are velvety, waxy, rough, tan, brown, or black spots that appear on the skin. These skin growths can be flat or raised, andscaly. What are the causes? The cause of this condition is not known. What increases the risk? You are more likely to develop this condition if you: Have a family history of seborrheic keratosis. Are 50 or older. Are pregnant. Have had estrogen replacement therapy. What are the signs or symptoms? Symptoms of this condition include growths on the face, chest, shoulders, back, or other areas. These growths: Are usually painless, but may become irritated and itchy. Can be yellow, brown, black, or other colors. Are slightly raised or have a flat surface. Are sometimes rough or wart-like in texture. Are often velvety or waxy on the surface. Are round or oval-shaped. Often occur in groups, but may occur as a single growth. How is this diagnosed? This condition is diagnosed with a medical history and physical exam. A sample of the growth may be tested (skin biopsy). You may need to see a skin specialist (dermatologist). How is this treated? Treatment is not usually needed for this condition, unless the growths are irritated or bleed often. You may also choose to have the growths removed if you do not like their appearance. Most commonly,  these growths are treated with a procedure in which liquid nitrogen is applied to "freeze" off the growth (cryosurgery). They may also be burned off with electricity (electrocautery) or removed by scraping (curettage). Follow these instructions at home: Watch your growth for any changes. Keep all follow-up visits as told by your health care provider. This is important. Do not scratch or pick at the growth or growths. This can cause them to become irritated or infected. Contact a health care provider if: You suddenly have many new growths. Your growth bleeds, itches, or hurts. Your growth suddenly becomes larger or changes color. Summary A seborrheic keratosis is a common, noncancerous (benign) skin growth. Treatment is not usually needed for this condition, unless the growths are irritated or bleed often. Watch your growth for any changes. Contact a health care provider if you suddenly have many new growths or your growth suddenly becomes larger or changes color. Keep all follow-up visits as told by your health care provider. This is important. This information is not intended to replace advice given to you by your health care provider. Make sure you discuss any questions you have with your healthcare provider. Document Revised: 05/04/2017 Document Reviewed: 05/04/2017 Elsevier Patient Education  2022 [place], Adult  A skin tag (acrochordon) is a soft, extra growth of skin. Most skin tags are skin-colored and rarely bigger than a pencil eraser. They commonly form in areas where there is frequent rubbing, or friction, on the skin. This may be where  there are folds in the skin, such as the eyelids, neck, armpit, or groin. Skin tags are not dangerous, and they do not spread from person to person (are not contagious). You may have one skin tag or several. Skin tags do not require treatment. However, your health care provider may recommend removal of a skin tag if it: Gets  irritated from clothing or jewelry. Bleeds. Is visible and unsightly. What are the causes? This condition is linked with: Increasing age. Pregnancy. Diabetes. Obesity. What are the signs or symptoms? Skin tags usually do not cause symptoms unless they get irritated by items touching your skin, such as clothing or jewelry. When this happens, you mayhave pain, itching, or bleeding. How is this diagnosed? This condition is diagnosed with an evaluation from your health care provider.No testing is needed for diagnosis. How is this treated? Treatment for this condition depends on whether you have symptoms. If a skin tag needs to be removed, your health care provider can remove it with: A simple surgical procedure using scissors. A procedure that involves freezing your skin tag with a gas in liquid form (liquid nitrogen). A procedure that uses heat to destroy your skin tag (electrodessication). Your health care provider may also remove your skin tag if it is visible orunsightly, Follow these instructions at home: Watch for any changes in your skin tag. A normal skin tag does not require any other special care at home. Take over-the-counter and prescription medicines only as told by your health care provider. Keep all follow-up visits as told by your health care provider. This is important. Contact a health care provider if: You have a skin tag that: Becomes painful. Changes color. Bleeds. Swells. Summary Skin tags are soft, extra growths of skin found in areas of frequent rubbing or friction. Skin tags usually do not cause symptoms. If symptoms occur, you may have pain, itching, or bleeding. If your skin tag causes symptoms or is unsightly, your health care provider can remove it. This information is not intended to replace advice given to you by your health care provider. Make sure you discuss any questions you have with your healthcare provider. Document Revised: 10/22/2018 Document  Reviewed: 10/22/2018 Elsevier Patient Education  2022 [place] Eating Plan DASH stands for Dietary Approaches to Stop Hypertension. The DASH eating plan is a healthy eating plan that has been shown to: Reduce high blood pressure (hypertension). Reduce your risk for type 2 diabetes, heart disease, and stroke. Help with weight loss. What are tips for following this plan? Reading food labels Check food labels for the amount of salt (sodium) per serving. Choose foods with less than 5 percent of the Daily Value of sodium. Generally, foods with less than 300 milligrams (mg) of sodium per serving fit into this eating plan. To find whole grains, look for the word "whole" as the first word in the ingredient list. Shopping Buy products labeled as "low-sodium" or "no salt added." Buy fresh foods. Avoid canned foods and pre-made or frozen meals. Cooking Avoid adding salt when cooking. Use salt-free seasonings or herbs instead of table salt or sea salt. Check with your health care provider or pharmacist before using salt substitutes. Do not fry foods. Cook foods using healthy methods such as baking, boiling, grilling, roasting, and broiling instead. Cook with heart-healthy oils, such as olive, canola, avocado, soybean, or sunflower oil. Meal planning  Eat a balanced diet that includes: 4 or more servings of fruits and 4 or more servings  of vegetables each day. Try to fill one-half of your plate with fruits and vegetables. 6-8 servings of whole grains each day. Less than 6 oz (170 g) of lean meat, poultry, or fish each day. A 3-oz (85-g) serving of meat is about the same size as a deck of cards. One egg equals 1 oz (28 g). 2-3 servings of low-fat dairy each day. One serving is 1 cup (237 mL). 1 serving of nuts, seeds, or beans 5 times each week. 2-3 servings of heart-healthy fats. Healthy fats called omega-3 fatty acids are found in foods such as walnuts, flaxseeds, fortified milks, and  eggs. These fats are also found in cold-water fish, such as sardines, salmon, and mackerel. Limit how much you eat of: Canned or prepackaged foods. Food that is high in trans fat, such as some fried foods. Food that is high in saturated fat, such as fatty meat. Desserts and other sweets, sugary drinks, and other foods with added sugar. Full-fat dairy products. Do not salt foods before eating. Do not eat more than 4 egg yolks a week. Try to eat at least 2 vegetarian meals a week. Eat more home-cooked food and less restaurant, buffet, and fast food.  Lifestyle When eating at a restaurant, ask that your food be prepared with less salt or no salt, if possible. If you drink alcohol: Limit how much you use to: 0-1 drink a day for women who are not pregnant. 0-2 drinks a day for men. Be aware of how much alcohol is in your drink. In the [place] one drink equals one 12 oz bottle of beer (355 mL), one 5 oz glass of wine (148 mL), or one 1 oz glass of hard liquor (44 mL). General information Avoid eating more than 2,300 mg of salt a day. If you have hypertension, you may need to reduce your sodium intake to 1,500 mg a day. Work with your health care provider to maintain a healthy body weight or to lose weight. Ask what an ideal weight is for you. Get at least 30 minutes of exercise that causes your heart to beat faster (aerobic exercise) most days of the week. Activities may include walking, swimming, or biking. Work with your health care provider or dietitian to adjust your eating plan to your individual calorie needs. What foods should I eat? Fruits All fresh, dried, or frozen fruit. Canned fruit in natural juice (without addedsugar). Vegetables Fresh or frozen vegetables (raw, steamed, roasted, or grilled). Low-sodium or reduced-sodium tomato and vegetable juice. Low-sodium or reduced-sodium tomatosauce and tomato paste. Low-sodium or reduced-sodium canned vegetables. Grains Whole-grain or  whole-wheat bread. Whole-grain or whole-wheat pasta. Brown rice. [place]. Bulgur. Whole-grain and low-sodium cereals. Pita bread.Low-fat, low-sodium crackers. Whole-wheat flour tortillas. Meats and other proteins Skinless chicken or [place]. Ground chicken or [place]. Pork with fat trimmed off. Fish and seafood. Egg whites. Dried beans, peas, or lentils. Unsalted nuts, nut butters, and seeds. Unsalted canned beans. Lean cuts of beef with fat trimmed off. Low-sodium, lean precooked or cured meat, such as sausages or meatloaves. Dairy Low-fat (1%) or fat-free (skim) milk. Reduced-fat, low-fat, or fat-free cheeses. Nonfat, low-sodium ricotta or cottage cheese. Low-fat or nonfatyogurt. Low-fat, low-sodium cheese. Fats and oils Soft margarine without trans fats. Vegetable oil. Reduced-fat, low-fat, or light mayonnaise and salad dressings (reduced-sodium). Canola, safflower, olive, avocado, soybean, andsunflower oils. Avocado. Seasonings and condiments Herbs. Spices. Seasoning mixes without salt. Other foods Unsalted popcorn and pretzels. Fat-free sweets. The items listed above may not be a  complete list of foods and beverages you can eat. Contact a dietitian for more information. What foods should I avoid? Fruits Canned fruit in a light or heavy syrup. Fried fruit. Fruit in cream or buttersauce. Vegetables Creamed or fried vegetables. Vegetables in a cheese sauce. Regular canned vegetables (not low-sodium or reduced-sodium). Regular canned tomato sauce and paste (not low-sodium or reduced-sodium). Regular tomato and vegetable juice(not low-sodium or reduced-sodium). Angie Fava. Olives. Grains Baked goods made with fat, such as croissants, muffins, or some breads. Drypasta or rice meal packs. Meats and other proteins Fatty cuts of meat. Ribs. Fried meat. Berniece Salines. Bologna, salami, and other precooked or cured meats, such as sausages or meat loaves. Fat from the back of a pig (fatback). Bratwurst.  Salted nuts and seeds. Canned beans with added salt. Canned orsmoked fish. Whole eggs or egg yolks. Chicken or [place] with skin. Dairy Whole or 2% milk, cream, and half-and-half. Whole or full-fat cream cheese. Whole-fat or sweetened yogurt. Full-fat cheese. Nondairy creamers. Whippedtoppings. Processed cheese and cheese spreads. Fats and oils Butter. Stick margarine. Lard. Shortening. Ghee. Bacon fat. Tropical oils, suchas coconut, palm kernel, or palm oil. Seasonings and condiments Onion salt, garlic salt, seasoned salt, table salt, and sea salt. Worcestershire sauce. Tartar sauce. Barbecue sauce. Teriyaki sauce. Soy sauce, including reduced-sodium. Steak sauce. Canned and packaged gravies. Fish sauce. Oyster sauce. Cocktail sauce. Store-bought horseradish. Ketchup. Mustard. Meat flavorings and tenderizers. Bouillon cubes. Hot sauces. Pre-made or packaged marinades. Pre-made or packaged taco seasonings. Relishes. Regular saladdressings. Other foods Salted popcorn and pretzels. The items listed above may not be a complete list of foods and beverages you should avoid. Contact a dietitian for more information. Where to find more information National Heart, Lung, and Blood Institute: https://wilson-eaton.com/ American Heart Association: www.heart.org Academy of Nutrition and Dietetics: www.eatright.[place]: www.kidney.org Summary The DASH eating plan is a healthy eating plan that has been shown to reduce high blood pressure (hypertension). It may also reduce your risk for type 2 diabetes, heart disease, and stroke. When on the DASH eating plan, aim to eat more fresh fruits and vegetables, whole grains, lean proteins, low-fat dairy, and heart-healthy fats. With the DASH eating plan, you should limit salt (sodium) intake to 2,300 mg a day. If you have hypertension, you may need to reduce your sodium intake to 1,500 mg a day. Work with your health care provider or dietitian to adjust  your eating plan to your individual calorie needs. This information is not intended to replace advice given to you by your health care provider. Make sure you discuss any questions you have with your healthcare provider. Document Revised: 11/23/2018 Document Reviewed: 11/23/2018 Elsevier Patient Education  2022 [employer].

## 2020-07-02 NOTE — Telephone Encounter (Signed)
Add back norvasc 2.5 to 5 mg daily is she agreeable cardiology rec this to better control BP She took this before at 10 mg ? Also want to continue farxiga 10 due to benefits for heart and kidneys

## 2020-07-03 ENCOUNTER — Encounter: Payer: Self-pay | Admitting: Urology

## 2020-07-03 ENCOUNTER — Ambulatory Visit (INDEPENDENT_AMBULATORY_CARE_PROVIDER_SITE_OTHER): Payer: Medicare HMO | Admitting: Cardiology

## 2020-07-03 ENCOUNTER — Encounter: Payer: Self-pay | Admitting: Cardiology

## 2020-07-03 ENCOUNTER — Ambulatory Visit (INDEPENDENT_AMBULATORY_CARE_PROVIDER_SITE_OTHER): Payer: Medicare HMO | Admitting: *Deleted

## 2020-07-03 VITALS — BP 160/100 | HR 81 | Ht 70.0 in | Wt 187.0 lb

## 2020-07-03 DIAGNOSIS — I428 Other cardiomyopathies: Secondary | ICD-10-CM

## 2020-07-03 DIAGNOSIS — E7800 Pure hypercholesterolemia, unspecified: Secondary | ICD-10-CM

## 2020-07-03 DIAGNOSIS — E119 Type 2 diabetes mellitus without complications: Secondary | ICD-10-CM

## 2020-07-03 DIAGNOSIS — I10 Essential (primary) hypertension: Secondary | ICD-10-CM | POA: Diagnosis not present

## 2020-07-03 MED ORDER — AMLODIPINE BESYLATE 5 MG PO TABS: 5.0000 mg | ORAL_TABLET | Freq: Every day | ORAL | 5 refills | Status: DC

## 2020-07-03 NOTE — Progress Notes (Signed)
Cardiology Office Note:    Date:  07/03/2020   ID:  Nancy Barajas, DOB Aug 09, 1960, MRN [id number]  PCP:  McLean-Scocuzza, Nino Glow, MD  Cardiologist:  Kate Sable, MD  Electrophysiologist:  None   Referring MD: McLean-Scocuzza, Olivia Mackie *   Chief Complaint  Patient presents with   1 month follow up     "Doing well." Patient c/o having frequent anxiety attacks. Medications reviewed by the patient verbally.     History of Present Illness:    Nancy Barajas is a 60 y.o. female with a hx of hypertension, NICM, initial EF 25-30%, improved to 40-45% on GDMT, diabetes, hyperlipidemia, CKD stage III, who presents for follow-up.    Being seen for nonischemic cardiomyopathy and hypertension.  Her blood pressures have been running higher of late.  Systolics of up to 353I at home.  She states multiple episodes of anxiety attacks of late roughly 2-3 times a week.  Tolerating other medications as prescribed.  Denies edema, being work-up for vocal cord issues, MRI is being planned.   Prior notes Echo 03/20/2019 EF 25 to 30% Left heart catheter 04/2019 no evidence of CAD, normal coronary arteries Echo 09/2019 EF 40 to 45%.   Past Medical History:  Diagnosis Date   Anxiety    Asthma    Depression    Diabetes mellitus without complication ([place])    History of kidney stones    Hypertension    Kidney stones    Vertigo    when laying flat   Vocal cord dysfunction    makes it hard for patient to talk and breathe    Past Surgical History:  Procedure Laterality Date   CARDIAC CATHETERIZATION     CYSTOSCOPY W/ RETROGRADES Right 09/27/2019   Procedure: CYSTOSCOPY WITH RETROGRADE PYELOGRAM;  Surgeon: Billey Co, MD;  Location: [facility];  Service: Urology;  Laterality: Right;   CYSTOSCOPY/URETEROSCOPY/HOLMIUM LASER/STENT PLACEMENT Left 09/27/2019   Procedure: CYSTOSCOPY/URETEROSCOPY/HOLMIUM LASER/STENT PLACEMENT;  Surgeon: Billey Co, MD;  Location: [facility];  Service: Urology;   Laterality: Left;   LITHOTRIPSY     RIGHT/LEFT HEART CATH AND CORONARY ANGIOGRAPHY Bilateral 04/08/2019   Procedure: RIGHT/LEFT HEART CATH AND CORONARY ANGIOGRAPHY;  Surgeon: Wellington Hampshire, MD;  Location: [facility];  Service: Cardiovascular;  Laterality: Bilateral;    Current Medications: Current Meds  Medication Sig   amLODipine (NORVASC) 5 MG tablet Take 1 tablet (5 mg total) by mouth daily.   baclofen (LIORESAL) 10 MG tablet Take 10 mg by mouth 2 (two) times daily.   blood glucose meter kit and supplies Dispense based on patient and insurance preference. Use twice daily as directed. (FOR ICD-10 E10.9, E11.9).   carvedilol (COREG) 25 MG tablet Take 1 tablet (25 mg total) by mouth 2 (two) times daily with a meal.   Cholecalciferol (VITAMIN D-3) 125 MCG (5000 UT) TABS Take 1 tablet by mouth daily.   Dulaglutide (TRULICITY) 1.5 RW/4.3XV SOPN Inject 1.5 mg into the skin once a week.   FARXIGA 10 MG TABS tablet Take 10 mg by mouth daily.   Lancets MISC 1 Device by Does not apply route in the morning, at noon, and at bedtime. Dispense for onetouch ultra 2 machine ONLY   LORazepam (ATIVAN) 0.5 MG tablet Take 0.5-1 tablets (0.25-0.5 mg total) by mouth daily as needed for anxiety.   Multiple Vitamins-Minerals (CENTRUM SILVER 50+WOMEN PO) Take 1 tablet by mouth daily.   ONETOUCH ULTRA test strip CHECK BLOOD SUGAR TWICE DAILY AS DIRECTED  rosuvastatin (CRESTOR) 10 MG tablet Take 1 tablet (10 mg total) by mouth every evening.   sacubitril-valsartan (ENTRESTO) 97-103 MG Take 1 tablet by mouth 2 (two) times daily.   sertraline (ZOLOFT) 100 MG tablet Take 1 tablet (100 mg total) by mouth daily.   sodium bicarbonate 650 MG tablet Take 1 tablet (650 mg total) by mouth 3 (three) times daily.   sodium chloride (OCEAN) 0.65 % SOLN nasal spray Place 1 spray into both nostrils as needed for congestion.    spironolactone (ALDACTONE) 25 MG tablet Take 1 tablet (25 mg total) by mouth daily.      Allergies:   Penicillins   Social History   Socioeconomic History   Marital status: Divorced    Spouse name: Not on file   Number of children: 2   Years of education: Not on file   Highest education level: 9th grade  Occupational History   Not on file  Tobacco Use   Smoking status: Former    Packs/day: 1.00    Years: 10.00    Pack years: 10.00    Types: Cigarettes    Quit date: 01/03/1998    Years since quitting: 22.5   Smokeless tobacco: Never  Vaping Use   Vaping Use: Never used  Substance and Sexual Activity   Alcohol use: Not Currently   Drug use: Not on file    Comment: years ago   Sexual activity: Not Currently  Other Topics Concern   Not on file  Social History Narrative   Lives at home    2 daughters as of 04/04/19 27 y.o daughter lives with her (she works)   Patient is on disability.   Social Determinants of Health   Financial Resource Strain: Low Risk    Difficulty of Paying Living Expenses: Not very hard  Food Insecurity: Food Insecurity Present   Worried About [place] in the Last Year: Sometimes true   Ran Out of Food in the Last Year: Sometimes true  Transportation Needs: No Transportation Needs   Lack of Transportation (Medical): No   Lack of Transportation (Non-Medical): No  Physical Activity: Not on file  Stress: Stress Concern Present   Feeling of Stress : To some extent  Social Connections: Not on file     Family History: The patient's family history includes Diabetes in her father and mother; Hypertension in her father and mother. There is no history of Breast cancer.  ROS:   Please see the history of present illness.     All other systems reviewed and are negative.  EKGs/Labs/Other Studies Reviewed:    The following studies were reviewed today:   EKG:  EKG is  ordered today.  The ekg ordered today demonstrates normal sinus rhythm, left bundle branch block  Recent Labs: 11/12/2019: TSH 0.85 02/19/2020: ALT 13; BUN 30;  Creatinine, Ser 1.32; Hemoglobin 13.5; Platelets 262.0; Potassium 4.3; Sodium 142  Recent Lipid Panel    Component Value Date/Time   CHOL 209 (H) 02/19/2020 1013   TRIG 89.0 02/19/2020 1013   HDL 92.10 02/19/2020 1013   CHOLHDL 2 02/19/2020 1013   VLDL 17.8 02/19/2020 1013   LDLCALC 100 (H) 02/19/2020 1013    Physical Exam:    VS:  BP (!) 160/100 (BP Location: Left Arm, Patient Position: Sitting, Cuff Size: Normal)   Pulse 81   Ht _0  (1.778 m)   Wt 187 lb (84.8 kg)   LMP 01/03/2013   SpO2 98%   BMI 26.83  kg/m     Wt Readings from Last 3 Encounters:  07/03/20 187 lb (84.8 kg)  07/02/20 186 lb 6.4 oz (84.6 kg)  05/14/20 180 lb (81.6 kg)     GEN:  Well nourished, well developed in no acute distress, obese HEENT: Normal NECK: No JVD; No carotid bruits LYMPHATICS: No lymphadenopathy CARDIAC: RRR, no murmurs, rubs, gallops RESPIRATORY:  Clear to auscultation without rales, wheezing or rhonchi  ABDOMEN: Soft, non-tender, non-distended MUSCULOSKELETAL:  No edema; No deformity  SKIN: Warm and dry NEUROLOGIC:  Alert and oriented x 3 PSYCHIATRIC:  Normal affect   ASSESSMENT:    1. NICM (nonischemic cardiomyopathy) ([place])   2. Primary hypertension   3. Pure hypercholesterolemia     PLAN:    In order of problems listed above:  Nonischemic cardiomyopathy, initial EF 25 to 30%.  Last EF 40%.  Continue Coreg, Entresto, Aldactone, Farxiga.  She is euvolemic, describes NYHA class II symptoms. History of hypertension, blood pressure is now controlled.  Continue Coreg 25 mg twice daily, Entresto, Aldactone  to 74m daily.  Start amlodipine 5 mg daily for BP benefit.  Consider increasing Aldactone if BP not controlled. hyperlipidemia, continue Crestor.  Follow-up in 3 months.   This note was generated in part or whole with voice recognition software. Voice recognition is usually quite accurate but there are transcription errors that can and very often do occur. I apologize  for any typographical errors that were not detected and corrected.  Medication Adjustments/Labs and Tests Ordered: Current medicines are reviewed at length with the patient today.  Concerns regarding medicines are outlined above.  Orders Placed This Encounter  Procedures   EKG 12-Lead    Meds ordered this encounter  Medications   amLODipine (NORVASC) 5 MG tablet    Sig: Take 1 tablet (5 mg total) by mouth daily.    Dispense:  30 tablet    Refill:  5     Patient Instructions  Medication Instructions:  Your physician has recommended you make the following change in your medication:    START taking Amlodipine 5 MG once a day.  *If you need a refill on your cardiac medications before your next appointment, please call your pharmacy*   Lab Work: None ordered If you have labs (blood work) drawn today and your tests are completely normal, you will receive your results only by: [place](if you have MyChart) OR A paper copy in the mail If you have any lab test that is abnormal or we need to change your treatment, we will call you to review the results.   Testing/Procedures: None ordered   Follow-Up: [facility] and your health needs are our priority.  As part of our continuing mission to provide you with exceptional heart care, we have created designated Provider Care Teams.  These Care Teams include your primary Cardiologist (physician) and Advanced Practice Providers (APPs -  Physician Assistants and Nurse Practitioners) who all work together to provide you with the care you need, when you need it.  We recommend signing up for the patient portal called "MyChart".  Sign up information is provided on this After Visit Summary.  MyChart is used to connect with patients for Virtual Visits (Telemedicine).  Patients are able to view lab/test results, encounter notes, upcoming appointments, etc.  Non-urgent messages can be sent to your provider as well.   To learn more  about what you can do with MyChart, go to [email or web address]    Your next  appointment:   3 month(s)  The format for your next appointment:   In Person  Provider:   Kate Sable, MD   Other Instructions     Signed, Kate Sable, MD  07/03/2020 4:45 PM    [place]

## 2020-07-03 NOTE — Telephone Encounter (Signed)
Today's appointment is with the nurse case manager, not me. If you could relay the medication changes to Ms. Jeschke, that would be great! Thank you!

## 2020-07-03 NOTE — Chronic Care Management (AMB) (Signed)
  Care Management   Follow Up Note   07/03/2020 Name: Nancy Barajas MRN: [id number] DOB: [birth year]   Referred by: McLean-Scocuzza, Nino Glow, MD Reason for referral : Chronic Care Management (HTN, DM)   Successful telephone outreach to patient for follow up.   Patient now has Cardiology appointment scheduled for this afternoon.  States she is getting ready for this appointment; requesting call back another day and time.  Follow Up Plan: The care management team will reach out to the patient again over the next 30 business days per patient's request.  Hubert Azure RN, MSN RN Care Management Coordinator [place] [phone] [email or web address]

## 2020-07-03 NOTE — Patient Instructions (Signed)
Medication Instructions:  Your physician has recommended you make the following change in your medication:    START taking Amlodipine 5 MG once a day.  *If you need a refill on your cardiac medications before your next appointment, please call your pharmacy*   Lab Work: None ordered If you have labs (blood work) drawn today and your tests are completely normal, you will receive your results only by: [place] (if you have MyChart) OR A paper copy in the mail If you have any lab test that is abnormal or we need to change your treatment, we will call you to review the results.   Testing/Procedures: None ordered   Follow-Up: At [facility], you and your health needs are our priority.  As part of our continuing mission to provide you with exceptional heart care, we have created designated Provider Care Teams.  These Care Teams include your primary Cardiologist (physician) and Advanced Practice Providers (APPs -  Physician Assistants and Nurse Practitioners) who all work together to provide you with the care you need, when you need it.  We recommend signing up for the patient portal called "MyChart".  Sign up information is provided on this After Visit Summary.  MyChart is used to connect with patients for Virtual Visits (Telemedicine).  Patients are able to view lab/test results, encounter notes, upcoming appointments, etc.  Non-urgent messages can be sent to your provider as well.   To learn more about what you can do with MyChart, go to [email or web address].    Your next appointment:   3 month(s)  The format for your next appointment:   In Person  Provider:   Kate Sable, MD   Other Instructions

## 2020-07-03 NOTE — Telephone Encounter (Signed)
Will this be discussed at Patient's 1:00 pm appointment today?

## 2020-07-03 NOTE — Telephone Encounter (Signed)
Patient informed and verbalized understanding.  Agreeable to medication

## 2020-07-06 NOTE — Telephone Encounter (Signed)
Of note card  added norvasc 5 mg she used to be on 10 mg qd

## 2020-07-07 ENCOUNTER — Ambulatory Visit (INDEPENDENT_AMBULATORY_CARE_PROVIDER_SITE_OTHER): Payer: Medicare HMO | Admitting: Psychology

## 2020-07-07 DIAGNOSIS — F418 Other specified anxiety disorders: Secondary | ICD-10-CM | POA: Diagnosis not present

## 2020-07-07 DIAGNOSIS — R69 Illness, unspecified: Secondary | ICD-10-CM | POA: Diagnosis not present

## 2020-07-07 NOTE — Telephone Encounter (Signed)
Noted  

## 2020-07-08 ENCOUNTER — Ambulatory Visit: Payer: Medicare HMO | Admitting: Pharmacist

## 2020-07-08 DIAGNOSIS — I509 Heart failure, unspecified: Secondary | ICD-10-CM

## 2020-07-08 DIAGNOSIS — F419 Anxiety disorder, unspecified: Secondary | ICD-10-CM

## 2020-07-08 DIAGNOSIS — I10 Essential (primary) hypertension: Secondary | ICD-10-CM

## 2020-07-08 DIAGNOSIS — R498 Other voice and resonance disorders: Secondary | ICD-10-CM

## 2020-07-08 DIAGNOSIS — E1122 Type 2 diabetes mellitus with diabetic chronic kidney disease: Secondary | ICD-10-CM

## 2020-07-08 DIAGNOSIS — E119 Type 2 diabetes mellitus without complications: Secondary | ICD-10-CM

## 2020-07-08 NOTE — Chronic Care Management (AMB) (Signed)
Chronic Care Management Pharmacy Note  07/08/2020 Name:  Nancy Barajas MRN:  [id number] DOB:  21-Sep-1960   Subjective: Nancy Barajas is an 60 y.o. year old female who is a primary patient of McLean-Scocuzza, Nino Glow, MD.  The CCM team was consulted for assistance with disease management and care coordination needs.    Engaged with patient by telephone for follow up visit in response to provider referral for pharmacy case management and/or care coordination services.   Consent to Services:  The patient was given information about Chronic Care Management services, agreed to services, and gave verbal consent prior to initiation of services.  Please see initial visit note for detailed documentation.   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, ordered PFT, but unlikely to be a primary pulmonay pathology issue  Hospital visits: None in previous 6 months  Objective:  Lab Results  Component Value Date   CREATININE 1.32 (H) 02/19/2020   CREATININE 1.19 11/12/2019   CREATININE 1.21 (H) 05/14/2019    Lab Results  Component Value Date   HGBA1C 6.2 02/19/2020   Last diabetic Eye exam:  Lab Results  Component Value Date/Time   HMDIABEYEEXA Retinopathy (A) 01/13/2020 12:00 AM    Last diabetic Foot exam: No results found for: HMDIABFOOTEX      Component Value Date/Time   CHOL 209 (H) 02/19/2020 1013   TRIG 89.0 02/19/2020 1013   HDL 92.10 02/19/2020 1013   CHOLHDL 2 02/19/2020 1013   VLDL 17.8 02/19/2020 1013    LDLCALC 100 (H) 02/19/2020 1013    Hepatic Function Latest Ref Rng & Units 02/19/2020 11/12/2019 02/20/2019  Total Protein 6.0 - 8.3 g/dL 7.3 6.9 7.6  Albumin 3.5 - 5.2 g/dL 4.2 4.2 4.2  AST 0 - 37 U/L '15 15 11  ' ALT 0 - 35 U/L '13 14 11  ' Alk Phosphatase 39 - 117 U/L 57 59 74  Total Bilirubin 0.2 - 1.2 mg/dL 0.8 0.6 0.6    Lab Results  Component Value Date/Time   TSH 0.85 11/12/2019 09:47 AM   TSH 0.93 11/07/2018 08:30 AM    CBC Latest Ref Rng & Units 02/19/2020 11/12/2019 04/02/2019  WBC 4.0 - 10.5 K/uL 4.3 4.9 7.2  Hemoglobin 12.0 - 15.0 g/dL 13.5 12.8 11.9  Hematocrit 36.0 - 46.0 % 42.6 40.3 38.9  Platelets 150.0 - 400.0 K/uL 262.0 284.0 376    Lab Results  Component Value Date/Time   VD25OH 52.82 07/10/2019 09:46 AM   VD25OH 27.75 (L) 08/07/2018 08:44 AM    Clinical ASCVD: No  The ASCVD Risk score Mikey Bussing DC Jr., et al., 2013) failed to calculate for the following reasons:   Unable to determine if patient is Non-Hispanic African American      Social History   Tobacco Use  Smoking Status Former   Packs/day: 1.00   Years: 10.00   Pack years: 10.00   Types: Cigarettes   Quit date: 01/03/1998   Years since quitting: 22.5  Smokeless Tobacco Never   BP Readings from Last 3 Encounters:  07/03/20 Marland Kitchen)  160/100  07/02/20 (!) 150/96  05/14/20 120/86   Pulse Readings from Last 3 Encounters:  07/03/20 81  07/02/20 83  05/14/20 84   Wt Readings from Last 3 Encounters:  07/03/20 187 lb (84.8 kg)  07/02/20 186 lb 6.4 oz (84.6 kg)  05/14/20 180 lb (81.6 kg)    Assessment: Review of patient past medical history, allergies, medications, health status, including review of consultants reports, laboratory and other test data, was performed as part of comprehensive evaluation and provision of chronic care management services.   SDOH:  (Social Determinants of Health) assessments and interventions performed:  SDOH Interventions    Flowsheet Row Most Recent Value  SDOH  Interventions   SDOH Interventions for the Following Domains Stress  Stress Interventions Provide Counseling  Depression Interventions/Treatment  Medication, Counseling, Currently on Treatment       CCM Care Plan  Allergies  Allergen Reactions   Penicillins Rash    Did it involve swelling of the face/tongue/throat, SOB, or low BP? No Did it involve sudden or severe rash/hives, skin peeling, or any reaction on the inside of your mouth or nose? No Did you need to seek medical attention at a hospital or doctor's office? No When did it last happen?      Childhood If all above answers are "NO", may proceed with cephalosporin use.    Medications Reviewed Today     Reviewed by De Hollingshead, RPH-CPP (Pharmacist) on 07/08/20 at [place] List Status: <None>   Medication Order Taking? Sig Documenting Provider Last Dose Status Informant  amLODipine (NORVASC) 5 MG tablet [id number] Yes Take 1 tablet (5 mg total) by mouth daily. Kate Sable, MD Taking Active   baclofen (LIORESAL) 10 MG tablet [id number] Yes Take 10 mg by mouth 2 (two) times daily. [provider] Taking Active   blood glucose meter kit and supplies [id number]  Dispense based on patient and insurance preference. Use twice daily as directed. (FOR ICD-10 E10.9, E11.9). McLean-Scocuzza, Nino Glow, MD  Active   carvedilol (COREG) 25 MG tablet [id number] Yes Take 1 tablet (25 mg total) by mouth 2 (two) times daily with a meal. McLean-Scocuzza, Nino Glow, MD Taking Active   Cholecalciferol (VITAMIN D-3) 125 MCG (5000 UT) TABS [id number] Yes Take 1 tablet by mouth daily. Jodelle Green, FNP Taking Active   Dulaglutide (TRULICITY) 1.5 JK/8.2SU SOPN [id number] Yes Inject 1.5 mg into the skin once a week. McLean-Scocuzza, Nino Glow, MD Taking Active   FARXIGA 10 MG TABS tablet [id number] Yes Take 10 mg by mouth daily. [provider] Taking Active   Lancets [place] [id number]  1 Device by Does not apply route in the morning,  at noon, and at bedtime. Dispense for onetouch ultra 2 machine ONLY McLean-Scocuzza, Nino Glow, MD  Active   LORazepam (ATIVAN) 0.5 MG tablet [id number] Yes Take 0.5-1 tablets (0.25-0.5 mg total) by mouth daily as needed for anxiety. McLean-Scocuzza, Nino Glow, MD Taking Active   Multiple Vitamins-Minerals (CENTRUM SILVER 50+WOMEN PO) [id number] Yes Take 1 tablet by mouth daily. [provider] Taking Active Self  Donald Siva test strip [id number]  CHECK BLOOD SUGAR TWICE DAILY AS DIRECTED McLean-Scocuzza, Nino Glow, MD  Active   rosuvastatin (CRESTOR) 10 MG tablet [id number] Yes Take 1 tablet (10 mg total) by mouth every evening. McLean-Scocuzza, Nino Glow, MD Taking Active   sacubitril-valsartan [facility]) 97-103 MG [id number] Yes Take 1 tablet by mouth 2 (two) times daily. Kate Sable, MD Taking Active   sertraline (ZOLOFT)  100 MG tablet [id number] Yes Take 1 tablet (100 mg total) by mouth daily. McLean-Scocuzza, Nino Glow, MD Taking Active   sodium bicarbonate 650 MG tablet [id number] Yes Take 1 tablet (650 mg total) by mouth 3 (three) times daily. Billey Co, MD Taking Active   sodium chloride (OCEAN) 0.65 % SOLN nasal spray [id number] Yes Place 1 spray into both nostrils as needed for congestion.  [provider] Taking Active Self  spironolactone (ALDACTONE) 25 MG tablet [id number] Yes Take 1 tablet (25 mg total) by mouth daily. Kate Sable, MD Taking Active             Patient Active Problem List   Diagnosis Date Noted   Skin tag 07/02/2020   Seborrheic keratosis 07/02/2020   Anxiety and depression 02/19/2020   Obstructive uropathy 08/01/2019   Gallstones 69/48/5462   Umbilical hernia 70/35/0093   Hematuria 07/15/2019   Hypertension associated with diabetes ([place]) 07/11/2019   Vitamin D deficiency 07/11/2019   Hiatal hernia 07/11/2019   Obesity (BMI 30.0-34.9) 07/11/2019   Heart failure with reduced ejection fraction (HCC)    GAD (generalized anxiety  disorder) 04/06/2019   Proteinuria 03/04/2019   Aortic atherosclerosis ([place]) 03/04/2019   Thyroid nodule 03/04/2019   Kidney cysts 03/04/2019   Hemangioma of liver 03/04/2019   Sinusitis 03/04/2019   Kidney stones    CKD stage 3 secondary to diabetes ([place]) 08/22/2018   Laryngeal spasm 05/07/2018   Muscle tension dysphonia 05/07/2018   Shortness of breath 01/22/2018   Uncontrolled hypertension 01/22/2018   Type 2 diabetes mellitus without complication, without long-term current use of insulin ([place]) 01/22/2018   Mixed hyperlipidemia 01/22/2018   Mild persistent asthma 01/22/2018    Immunization History  Administered Date(s) Administered   Influenza,inj,Quad PF,6+ Mos 10/10/2017, 11/07/2018, 11/12/2019   PFIZER(Purple Top)SARS-COV-2 Vaccination 05/23/2019, 06/13/2019, 12/10/2019   Pneumococcal Polysaccharide-23 07/10/2019    Conditions to be addressed/monitored: CHF, HTN, HLD, DMII, and Anxiety  Care Plan : Medication Management (Pharmacist)  Updates made by De Hollingshead, RPH-CPP since 07/08/2020 12:00 AM     Problem: Diabetes, CKD, HF, HTN, Anxiety      Long-Range Goal: Disease Progression Prevention   Start Date: 01/30/2020  This Visit's Progress: On track  Recent Progress: On track  Priority: High  Note:   Current Barriers:  Unable to independently afford treatment regimen Complex patient with multiple disease states at increased risk for disease progression   Pharmacist Clinical Goal(s):  Over the next 90 days, patient will maintain control of diabetes and hypertension as evidenced by maintenance of lab work through collaboration with PharmD and provider.  Interventions: 1:1 collaboration with McLean-Scocuzza, Nino Glow, MD regarding development and update of comprehensive plan of care as evidenced by provider attestation and co-signature Inter-disciplinary care team collaboration (see longitudinal plan of care) Comprehensive medication review performed;  medication list updated in electronic medical record  Health Maintenance: Reviewed recommendation for Shingrix, Tdap. Patient aware, will circle back to scheduling these vaccinations once she gets through her current neurology work up for vocal cord dystonia Up to date on eye exam, foot exam Needs to schedule fasting lab work. Assisted in getting this done.   Diabetes: Controlled per last A1c, current treatment: Trulicity 1.5 mg, Farxiga 10 mg daily   Avoiding metformin d/t CKD  Current glucose readings:  Fasting Post Prandial  6-Jul 103   7-Jul 98 110  8-Jul 109 115  9-Jul 120 120  10-Jul 101   11-Jul 99  Reviewed long term CV and HF benefit of GLP1 and SGLT2. Discussed that moving forward, could consider dose reduction of Trulicity if patient wants, but that there is no harm in continuing current regimen as no risk of hypoglycemia, and patient denies any concerns with GI upset, nausea vomiting. Would strongly advise to continue SGLT2 for DM, CKD, and HF benefits. Patient verbalizes understanding.   Continue to monitor glucose at least BID - fasting and 2 hours after meals. Patient verbalizes understanding Recmmend to continue current regimen at this time.    Hypertension with HFrEF (most recent EF 40%, prior 25-30%) Appropriately managed; current treatment: carvedilol 25 mg BID, spironolactone 25 mg daily, Entresto 97/103 mg BID, amlodipine 5 mg daily started on Friday; follows w/ Dr. Garen Lah cardiology Current home BP readings:   SBP DBP HR SBP DBP HR SBP DBP HR  6-Jul 113 87 71 114 67 75 128 93 70  5-Jul 139 86 69         Denies any lower extremity edema in the past several days. We discussed that full onset of action of amlodipine can take up to a week. Continues to weigh daily; this morning 182 lbs. Aware to contact cardiology if increase in weight of >3 lbs in 1 day or >5 lbs in 1 week. Recommended continue current regimen and collaboration w/ cardiology,  nephrology. Contacted Dr. Garen Lah to determine if f/u appointment this Friday is still needed.    Hyperlipidemia: Controlled per last lipid panel; current treatment: rosuvastatin 10 mg daily  Recommended to continue current regimen. Updated lipid panel scheduled.  Hx Kidney Stones, Urinary Incontinence: Controlled per patient report; current regimen: sodium bicarbonate 650 mg TID Previously discussed oxybuytinin with urology. Decided her symptoms were not "bad enough" to want to add additional medication at that time. Continue current regimen at this time.    Depression/Anxiety: Improved per patient report; current treatment: sertraline 100 mg daily - self reduced, says that someone told her that occasional jitteriness could have come from sertraline; lorazepam 0.25-0.5 mg daily, taking QAM. Follows w/ psychiatry Dr. Mamie Nick and Garald Braver. Reports she was given a list of psychiatrists from [place], she is going to look over it and determine who is in network. Reviewed to let us know if she needs a referral Reviewed recommendation to keep lorazepam dosing to no more than daily. Continue plan to establish with a new psychiatrist to determine optimal treatment for anxiety. Continue collaboration with LCSW.  Vocal Cord Dysphonia: Uncontrolled, work up from neurology, pulmonary. Appointment later this afternoon with Dr. Manuella Ghazi to determine optimal plan to have MRI completed, as patient's anxiety was preventing completion; current regimen: baclofen 10 mg BID; unsure if this has provided significant benefit since initiation.  PFT ordered by pulmonary Dr. Patsey Berthold, but patient unable to complete test due to vocal stridor.  Continue collaboration with neurology, primary care, pulmonary.  Supplement: Vitamin D 1000 units, Multivitamin    Patient Goals/Self-Care Activities Over the next 90 days, patient will:  - take medications as prescribed check glucose twice daily, document, and provide at future  appointments check blood pressure daily, document, and provide at future appointments  Follow Up Plan: Telephone follow up appointment with care management team member scheduled for: ~ 12 weeks     Medication Assistance: None required.  Patient affirms current coverage meets needs. Medicare Extra Help  Patient's preferred pharmacy is:  [facility] ([facility] [place] - [address] [place] (N) [place]  [address] Phone: [phone] Fax: [phone]   Follow Up:  Patient agrees to Care Plan and Follow-up.  Plan: Telephone follow up appointment with care management team member scheduled for:  ~ 12 weeks  [name], PharmD, [place], [place] Clinical Pharmacist [employer] [facility] [employer] [phone]

## 2020-07-08 NOTE — Patient Instructions (Signed)
Nancy Barajas,   It was great to talk to you today!  Your blood sugar readings and blood pressure readings look fantastic. Continue to check those daily. I recommend continuing Farxiga and Trulicity for your blood sugars because of the benefit for reducing your risk of heart attacks/strokes, reducing risk of progression of kidney damage, and reducing risk of heart failure exacerbations. If we do ever decide to reduce something, I'd reduce the dose of Trulicity first. I would recommend definitely staying on Farxiga long term.   The amlodipine can take up to a week for full effect. Let Dr. Garen Lah know if you develop significant swelling in your feet or ankles.   When thing settle down, talk to your [facility] pharmacist about the Tdap vaccine and the Shingrix vaccine series.   As always, let me know if any questions or concerns. Thanks!  [name], PharmD [phone]  Visit Information  PATIENT GOALS:  Goals Addressed               This Visit's Progress     Patient Stated     Medication Monitoring (pt-stated)        Patient Goals/Self-Care Activities Over the next 90 days, patient will:  - take medications as prescribed check glucose twice daily, document, and provide at future appointments check blood pressure daily, document, and provide at future appointments          The patient verbalized understanding of instructions, educational materials, and care plan provided today and agreed to receive a mailed copy of patient instructions, educational materials, and care plan.   Plan: Telephone follow up appointment with care management team member scheduled for:  ~ 12 weeks  [name], PharmD, [place], [place] Clinical Pharmacist [employer] at [employer] [phone]

## 2020-07-10 ENCOUNTER — Ambulatory Visit: Payer: Medicare HMO | Admitting: Cardiology

## 2020-07-15 ENCOUNTER — Other Ambulatory Visit (INDEPENDENT_AMBULATORY_CARE_PROVIDER_SITE_OTHER): Payer: Medicare HMO

## 2020-07-15 ENCOUNTER — Other Ambulatory Visit: Payer: Self-pay

## 2020-07-15 DIAGNOSIS — E1159 Type 2 diabetes mellitus with other circulatory complications: Secondary | ICD-10-CM | POA: Diagnosis not present

## 2020-07-15 DIAGNOSIS — E559 Vitamin D deficiency, unspecified: Secondary | ICD-10-CM | POA: Diagnosis not present

## 2020-07-15 DIAGNOSIS — I152 Hypertension secondary to endocrine disorders: Secondary | ICD-10-CM

## 2020-07-15 LAB — COMPREHENSIVE METABOLIC PANEL
ALT: 13 U/L (ref 0–35)
AST: 15 U/L (ref 0–37)
Albumin: 4.4 g/dL (ref 3.5–5.2)
Alkaline Phosphatase: 51 U/L (ref 39–117)
BUN: 35 mg/dL — ABNORMAL HIGH (ref 6–23)
CO2: 31 mEq/L (ref 19–32)
Calcium: 9.6 mg/dL (ref 8.4–10.5)
Chloride: 102 mEq/L (ref 96–112)
Creatinine, Ser: 1.31 mg/dL — ABNORMAL HIGH (ref 0.40–1.20)
GFR: 44.23 mL/min — ABNORMAL LOW (ref >60.00)
Glucose, Bld: 97 mg/dL (ref 70–99)
Potassium: 4.3 mEq/L (ref 3.5–5.1)
Sodium: 140 mEq/L (ref 135–145)
Total Bilirubin: 0.7 mg/dL (ref 0.2–1.2)
Total Protein: 7.1 g/dL (ref 6.0–8.3)

## 2020-07-15 LAB — CBC WITH DIFFERENTIAL/PLATELET
Basophils Absolute: 0.1 10*3/uL (ref 0.0–0.1)
Basophils Relative: 1.4 % (ref 0.0–3.0)
Eosinophils Absolute: 0.1 10*3/uL (ref 0.0–0.7)
Eosinophils Relative: 1.7 % (ref 0.0–5.0)
HCT: 39.5 % (ref 36.0–46.0)
Hemoglobin: 12.6 g/dL (ref 12.0–15.0)
Lymphocytes Relative: 35.3 % (ref 12.0–46.0)
Lymphs Abs: 1.4 10*3/uL (ref 0.7–4.0)
MCHC: 31.9 g/dL (ref 30.0–36.0)
MCV: 73.5 fl — ABNORMAL LOW (ref 78.0–100.0)
Monocytes Absolute: 0.4 10*3/uL (ref 0.1–1.0)
Monocytes Relative: 9.1 % (ref 3.0–12.0)
Neutro Abs: 2.2 10*3/uL (ref 1.4–7.7)
Neutrophils Relative %: 52.5 % (ref 43.0–77.0)
Platelets: 244 10*3/uL (ref 150.0–400.0)
RBC: 5.38 Mil/uL — ABNORMAL HIGH (ref 3.87–5.11)
RDW: 16.3 % — ABNORMAL HIGH (ref 11.5–15.5)
WBC: 4.1 10*3/uL (ref 4.0–10.5)

## 2020-07-15 LAB — LIPID PANEL
Cholesterol: 210 mg/dL — ABNORMAL HIGH (ref 0–200)
HDL: 94.5 mg/dL (ref >39.00)
LDL Cholesterol: 98 mg/dL (ref 0–99)
NonHDL: 115.31
Total CHOL/HDL Ratio: 2
Triglycerides: 86 mg/dL (ref 0.0–149.0)
VLDL: 17.2 mg/dL (ref 0.0–40.0)

## 2020-07-15 LAB — HEMOGLOBIN A1C: Hgb A1c MFr Bld: 6.3 % (ref 4.6–6.5)

## 2020-07-15 LAB — VITAMIN D 25 HYDROXY (VIT D DEFICIENCY, FRACTURES): VITD: 67.04 ng/mL (ref 30.00–100.00)

## 2020-07-20 ENCOUNTER — Encounter: Payer: Self-pay | Admitting: Pulmonary Disease

## 2020-07-20 ENCOUNTER — Ambulatory Visit (INDEPENDENT_AMBULATORY_CARE_PROVIDER_SITE_OTHER): Payer: Medicare HMO | Admitting: Pulmonary Disease

## 2020-07-20 ENCOUNTER — Other Ambulatory Visit: Payer: Self-pay

## 2020-07-20 VITALS — BP 140/80 | HR 88 | Temp 97.8°F | Ht 70.0 in | Wt 180.4 lb

## 2020-07-20 DIAGNOSIS — I5020 Unspecified systolic (congestive) heart failure: Secondary | ICD-10-CM | POA: Diagnosis not present

## 2020-07-20 DIAGNOSIS — J383 Other diseases of vocal cords: Secondary | ICD-10-CM | POA: Diagnosis not present

## 2020-07-20 DIAGNOSIS — R0602 Shortness of breath: Secondary | ICD-10-CM

## 2020-07-20 DIAGNOSIS — Z9189 Other specified personal risk factors, not elsewhere classified: Secondary | ICD-10-CM | POA: Diagnosis not present

## 2020-07-20 DIAGNOSIS — J3089 Other allergic rhinitis: Secondary | ICD-10-CM | POA: Diagnosis not present

## 2020-07-20 MED ORDER — MONTELUKAST SODIUM 10 MG PO TABS: 10.0000 mg | ORAL_TABLET | Freq: Every day | ORAL | 2 refills | Status: DC

## 2020-07-20 NOTE — Progress Notes (Signed)
Subjective:    Patient ID: Nancy Barajas, female    DOB: 1960-12-19, 60 y.o.   MRN: [id number] Chief Complaint  Patient presents with   Follow-up    Sob with exertion.    HPI This is a 60 year old remote former smoker who presents for follow-up on dyspnea on exertion.  This is a scheduled visit.  Recall that the patient has laryngeal dystonia/spasmodic dysphonia.  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  Onset of the symptoms was around 2017.  She has noted worsening of the symptoms.  She has been followed at the [facility] and also by Neurology at [facility].  She continues to have issues with dyspnea that is triggered by gasping and stridorous inhalation and feeling that she is hyperventilating when these issues occur.  She feels that her coordination between breathing and speaking is challenging.  These involuntary motions make her fatigued.  Her issues are complicated by nonischemic cardiomyopathy with decrease LVEF to 40%.  Previously, she had not noted any sleep issues but here recently she notes that she is not getting restorative sleep.  Occasionally wakes up gasping.  Last PFTs obtained were on 15 February 2018 limited to spirometry which showed no obstruction, the patient was supposed to have full PFTs after that but was unable to complete the test.  She has had at MRI of the brain that did not show acute intracranial pathology.  She has been evaluated by neurology (Dr. Manuella Ghazi, [facility]) and he has initiated a trial of baclofen.  She has not noticed significant provement with this medication.  She has been referred to the [facility] by Dr. Manuella Ghazi.  She notes that she has had increasing issues with nasal congestion with the season change.  This in turn leads to significant postnasal drip and worsening of her upper airway symptoms.  No fevers, chills or  sweats.  No other issues endorsed today.   Review of Systems A 10 point review of systems was performed and it is as noted above otherwise negative.  Patient Active Problem List   Diagnosis Date Noted   Skin tag 07/02/2020   Seborrheic keratosis 07/02/2020   Anxiety and depression 02/19/2020   Obstructive uropathy 08/01/2019   Gallstones 87/56/4332   Umbilical hernia 95/18/8416   Hematuria 07/15/2019   Hypertension associated with diabetes ([place]) 07/11/2019   Vitamin D deficiency 07/11/2019   Hiatal hernia 07/11/2019   Obesity (BMI 30.0-34.9) 07/11/2019   Heart failure with reduced ejection fraction (HCC)    GAD (generalized anxiety disorder) 04/06/2019   Proteinuria 03/04/2019   Aortic atherosclerosis ([place]) 03/04/2019   Thyroid nodule 03/04/2019   Kidney cysts 03/04/2019   Hemangioma of liver 03/04/2019   Sinusitis 03/04/2019   Kidney stones    CKD stage 3 secondary to diabetes ([place]) 08/22/2018   Laryngeal spasm 05/07/2018   Muscle tension dysphonia 05/07/2018   Shortness of breath 01/22/2018   Uncontrolled hypertension 01/22/2018   Type 2 diabetes mellitus without complication, without long-term current use of insulin ([place]) 01/22/2018   Mixed hyperlipidemia 01/22/2018   Mild persistent asthma 01/22/2018   Social History   Tobacco Use   Smoking status: Former    Packs/day: 1.00    Years: 10.00    Pack years: 10.00    Types: Cigarettes    Quit date: 01/03/1998    Years since quitting: 22.5   Smokeless tobacco: Never  Substance  Use Topics   Alcohol use: Not Currently   Allergies  Allergen Reactions   Penicillins Rash    Did it involve swelling of the face/tongue/throat, SOB, or low BP? No Did it involve sudden or severe rash/hives, skin peeling, or any reaction on the inside of your mouth or nose? No Did you need to seek medical attention at a hospital or doctor's office? No When did it last happen?      Childhood If all above answers are "NO", may proceed with  cephalosporin use.   Current Meds  Medication Sig   amLODipine (NORVASC) 5 MG tablet Take 1 tablet (5 mg total) by mouth daily.   baclofen (LIORESAL) 10 MG tablet Take 10 mg by mouth 2 (two) times daily.   blood glucose meter kit and supplies Dispense based on patient and insurance preference. Use twice daily as directed. (FOR ICD-10 E10.9, E11.9).   carvedilol (COREG) 25 MG tablet Take 1 tablet (25 mg total) by mouth 2 (two) times daily with a meal.   Cholecalciferol (VITAMIN D-3) 125 MCG (5000 UT) TABS Take 1 tablet by mouth daily.   Dulaglutide (TRULICITY) 1.5 EG/3.1DV SOPN Inject 1.5 mg into the skin once a week.   FARXIGA 10 MG TABS tablet Take 10 mg by mouth daily.   Lancets MISC 1 Device by Does not apply route in the morning, at noon, and at bedtime. Dispense for onetouch ultra 2 machine ONLY   LORazepam (ATIVAN) 0.5 MG tablet Take 0.5-1 tablets (0.25-0.5 mg total) by mouth daily as needed for anxiety.   Multiple Vitamins-Minerals (CENTRUM SILVER 50+WOMEN PO) Take 1 tablet by mouth daily.   ONETOUCH ULTRA test strip CHECK BLOOD SUGAR TWICE DAILY AS DIRECTED   rosuvastatin (CRESTOR) 10 MG tablet Take 1 tablet (10 mg total) by mouth every evening.   sacubitril-valsartan (ENTRESTO) 97-103 MG Take 1 tablet by mouth 2 (two) times daily.   sertraline (ZOLOFT) 100 MG tablet Take 1 tablet (100 mg total) by mouth daily.   sodium bicarbonate 650 MG tablet Take 1 tablet (650 mg total) by mouth 3 (three) times daily.   sodium chloride (OCEAN) 0.65 % SOLN nasal spray Place 1 spray into both nostrils as needed for congestion.    spironolactone (ALDACTONE) 25 MG tablet Take 1 tablet (25 mg total) by mouth daily.   Immunization History  Administered Date(s) Administered   Influenza,inj,Quad PF,6+ Mos 10/10/2017, 11/07/2018, 11/12/2019   PFIZER(Purple Top)SARS-COV-2 Vaccination 05/23/2019, 06/13/2019, 12/10/2019   Pneumococcal Polysaccharide-23 07/10/2019      Objective:   Physical Exam BP  140/80 (BP Location: Left Arm, Cuff Size: Normal)   Pulse 88   Temp 97.8 F (36.6 C) (Temporal)   Ht _0  (1.778 m)   Wt 180 lb 6.4 oz (81.8 kg)   LMP 01/03/2013   SpO2 98%   BMI 25.88 kg/m  GENERAL: Well-developed somewhat overweight woman, no acute distress.  Speaks with a hoarse voice, halting, frequent inspiratory stridorous sounds. HEAD: Normocephalic, atraumatic. EYES: Pupils equal, round, reactive to light.  No scleral icterus. MOUTH: Nose/mouth/throat not examined due to masking requirements for COVID 19. NECK: Supple. No thyromegaly. Trachea midline. No JVD.  No adenopathy. PULMONARY: Upper airway pseudowheeze.  Good air entry bilaterally.  No adventitious sounds. CARDIOVASCULAR: S1 and S2. Regular rate and rhythm.  No rubs, murmurs or gallops heard. ABDOMEN: Benign. MUSCULOSKELETAL: No joint deformity, no clubbing, no edema. NEUROLOGIC: No overt focal deficit. SKIN: Intact,warm,dry. PSYCH: Anxious/depressed mood.  Results of the Epworth flowsheet 07/20/2020  Sitting and reading 1  Watching TV 1  Sitting, inactive in a public place (e.g. a theatre or a meeting) 0  As a passenger in a car for an hour without a break 0  Lying down to rest in the afternoon when circumstances permit 1  Sitting and talking to someone 0  Sitting quietly after a lunch without alcohol 0  In a car, while stopped for a few minutes in traffic 0  Total score 3       Assessment & Plan:     ICD-10-CM   1. Laryngeal dystonia/Spasmodic Dysphonia  J38.3    This is a very complex problem This is not a primary pulmonary issue She has documented TVC adduction with inspiration This leads to a sensation of dyspnea    2. Heart failure with reduced ejection fraction (HCC)  I50.20    This issue adds complexity to her management Adds to the sensation of dyspnea    3. At risk for sleep apnea  Z91.89 Home sleep test   Given her vocal cord issues high risk for OSA She notes nonrestorative sleep      4. Shortness of breath  R06.02    Multifactorial Related by the issues above    5. Perennial allergic rhinitis  J30.89    Postnasal drip due to this aggravates her issues Trial of Singulair 10 mg daily     Orders Placed This Encounter  Procedures   Home sleep test    Standing Status:   Future    Standing Expiration Date:   07/20/2021    Order Specific Question:   Where should this test be performed:    Answer:   [facility] - Pulmonary   Meds ordered this encounter  Medications   montelukast (SINGULAIR) 10 MG tablet    Sig: Take 1 tablet (10 mg total) by mouth daily.    Dispense:  30 tablet    Refill:  2   Discussion:  Miyu continues to have issues with her adductor spasmodic dysphonia.  She has recently been placed on baclofen but her symptoms still persist.  This is a very difficult problem to treat.  Her issues with dyspnea and fatigue are related to this issue and her cardiomyopathy.  Do not believe that she has primary pulmonary issues.  Inhalers will be of no use in this circumstance and can actually make her TVC adduction worse.  She does sense that she has issues with perennial nasal congestion and postnasal drip which worsens her symptoms.  We will give her a trial of Singulair for this.  I have recommended that she utilize a harmonica for management of her dysphonia.  It is known that patients that have this issue can alleviate symptoms by singing or humming.  A harmonica can help with clearance of upper airway secretions and helping coordinate her inspiratory and expiratory movements while maintaining vocal cords open.  We are obtaining home sleep study to see if CPAP may help with maintaining vocal cords from paradoxically adducting.  Previously she had not noticed symptoms during sleep but has been having some nocturnal awakenings gasping lately.  Patient in follow-up in 4 to 6 weeks time she is to contact us prior to that time should any new difficulties arise.  Renold Don, MD [facility]   *This note was dictated using voice recognition software/Dragon.  Despite best efforts to proofread, errors can occur which can change the meaning.  Any change was purely unintentional.

## 2020-07-20 NOTE — Patient Instructions (Signed)
We are giving you a medication called Singulair for your postnasal drip.  If you have asthma I will help her as well.  I recommend that you get a harmonica and do 15-20 cycles of breathing in and out slowly through it several times a day (at least 4-5 times a day).  This will help work your muscles and also keep your brain "entertained" that way it will not give faulty signals to your larynx.  We are going to get a home sleep study.   We will see her in follow-up in 4 to 6 weeks time you may see me or the nurse practitioner.

## 2020-07-22 ENCOUNTER — Ambulatory Visit: Payer: Medicare HMO | Admitting: *Deleted

## 2020-07-22 DIAGNOSIS — I5020 Unspecified systolic (congestive) heart failure: Secondary | ICD-10-CM

## 2020-07-22 DIAGNOSIS — I10 Essential (primary) hypertension: Secondary | ICD-10-CM

## 2020-07-22 DIAGNOSIS — E119 Type 2 diabetes mellitus without complications: Secondary | ICD-10-CM | POA: Diagnosis not present

## 2020-07-22 DIAGNOSIS — E1122 Type 2 diabetes mellitus with diabetic chronic kidney disease: Secondary | ICD-10-CM | POA: Diagnosis not present

## 2020-07-22 DIAGNOSIS — I509 Heart failure, unspecified: Secondary | ICD-10-CM

## 2020-07-22 DIAGNOSIS — N183 Chronic kidney disease, stage 3 unspecified: Secondary | ICD-10-CM | POA: Diagnosis not present

## 2020-07-22 NOTE — Patient Instructions (Signed)
Visit Information  PATIENT GOALS:  Goals Addressed             This Visit's Progress    COMPLETED: (CCM RN) Find Help in My Community   On track    Timeframe:  Long-Range Goal Priority:  Medium Start Date:  12/11/2019                           Expected End Date:  08/01/2020         Follow Up Date:  06/22/20                Continue to work with Clinical research associate for making arrangements for Surveyor, quantity Medicaid or Social Services to verify medicaid benefit showing in system   Why is this important?   Knowing how and where to find help for yourself or family in your neighborhood and community is an important skill.  You will want to take some steps to learn how.    Notes: Confirmed with Medicaid she has Family Planning Medicaid     (CCM RN) Monitor and Manage My Blood Sugar, Blood Pressure, and Daily Weights   On track    Timeframe:  Long-Range Goal Priority:  Medium Start Date:  10/07/2019                           Expected End Date:  11/02/20         Follow Up:  08/24/20             Check blood sugar at least twice a day (before meals or 2 hours post eating) Check blood sugar if I feel it is too high or too low Enter blood sugar readings and medication into daily log and take log to provider appointments Continue to monitor weights daily and keeping in log Weigh self first thing in the mornings after voiding Notify provider for increase weight 3 pounds overnight of 5 pounds within 5 days or lower extremity swelling Continue to monitor blood pressures daily, noting if blood pressures elevated if prior to taking medications and to recheck after taking morning medications  Why is this important?   Checking your blood sugar at home helps to keep it from getting very high or very low.  Writing the results in a diary or log helps the doctor know how to care for you.  Your blood sugar log should have the time, date and the results.  Also, write down the amount of insulin or  other medicine that you take.  Other information, like what you ate, exercise done and how you were feeling, will also be helpful.     Notes:       (CCM RN) Set My Target A1C-Diabetes Type 2   On track    Timeframe:  Long-Range Goal Priority:  Medium Start Date:   12/11/2019                          Expected End Date:  11/02/2020      Follow Up Date:  08/24/20                   Set target A1C; current A1C decreased to 6.2(02/19/20) Discuss with PCP goal Hgb A1C    Why is this important?   Your target A1C is decided together by you and your doctor.  It is based  on several things like your age and other health issues.    Notes:         Patient verbalizes understanding of instructions provided today and agrees to view in [place].   The care management team will reach out to the patient again over the next 45 business days.   Hubert Azure RN, MSN RN Care Management Coordinator [place] [phone] [email or web address]

## 2020-07-22 NOTE — Chronic Care Management (AMB) (Signed)
Chronic Care Management   CCM RN Visit Note  07/22/2020 Name: Nancy Barajas MRN: [id number] DOB: [birth year]  Subjective: Nancy Barajas is a 60 y.o. year old female who is a primary care patient of McLean-Scocuzza, Nino Glow, MD. The care management team was consulted for assistance with disease management and care coordination needs.    Engaged with patient by telephone for follow up visit in response to provider referral for case management and/or care coordination services.   Consent to Services:  The patient was given information about Chronic Care Management services, agreed to services, and gave verbal consent prior to initiation of services.  Please see initial visit note for detailed documentation.   Patient agreed to services and verbal consent obtained.   Assessment: Review of patient past medical history, allergies, medications, health status, including review of consultants reports, laboratory and other test data, was performed as part of comprehensive evaluation and provision of chronic care management services.   SDOH (Social Determinants of Health) assessments and interventions performed:    CCM Care Plan  Allergies  Allergen Reactions   Penicillins Rash    Did it involve swelling of the face/tongue/throat, SOB, or low BP? No Did it involve sudden or severe rash/hives, skin peeling, or any reaction on the inside of your mouth or nose? No Did you need to seek medical attention at a hospital or doctor's office? No When did it last happen?      Childhood If all above answers are "NO", may proceed with cephalosporin use.    Outpatient Encounter Medications as of 07/22/2020  Medication Sig   amLODipine (NORVASC) 5 MG tablet Take 1 tablet (5 mg total) by mouth daily.   Dulaglutide (TRULICITY) 1.5 WU/9.8JX SOPN Inject 1.5 mg into the skin once a week.   FARXIGA 10 MG TABS tablet Take 10 mg by mouth daily.   LORazepam (ATIVAN) 0.5 MG tablet Take 0.5-1 tablets  (0.25-0.5 mg total) by mouth daily as needed for anxiety.   baclofen (LIORESAL) 10 MG tablet Take 10 mg by mouth 2 (two) times daily.   blood glucose meter kit and supplies Dispense based on patient and insurance preference. Use twice daily as directed. (FOR ICD-10 E10.9, E11.9).   carvedilol (COREG) 25 MG tablet Take 1 tablet (25 mg total) by mouth 2 (two) times daily with a meal.   Cholecalciferol (VITAMIN D-3) 125 MCG (5000 UT) TABS Take 1 tablet by mouth daily.   Lancets MISC 1 Device by Does not apply route in the morning, at noon, and at bedtime. Dispense for onetouch ultra 2 machine ONLY   montelukast (SINGULAIR) 10 MG tablet Take 1 tablet (10 mg total) by mouth daily.   Multiple Vitamins-Minerals (CENTRUM SILVER 50+WOMEN PO) Take 1 tablet by mouth daily.   ONETOUCH ULTRA test strip CHECK BLOOD SUGAR TWICE DAILY AS DIRECTED   rosuvastatin (CRESTOR) 10 MG tablet Take 1 tablet (10 mg total) by mouth every evening.   sacubitril-valsartan (ENTRESTO) 97-103 MG Take 1 tablet by mouth 2 (two) times daily.   sertraline (ZOLOFT) 100 MG tablet Take 1 tablet (100 mg total) by mouth daily.   sodium bicarbonate 650 MG tablet Take 1 tablet (650 mg total) by mouth 3 (three) times daily.   sodium chloride (OCEAN) 0.65 % SOLN nasal spray Place 1 spray into both nostrils as needed for congestion.    spironolactone (ALDACTONE) 25 MG tablet Take 1 tablet (25 mg total) by mouth daily.   Facility-Administered Encounter Medications as of 07/22/2020  Medication  albuterol (PROVENTIL) (2.5 MG/3ML) 0.083% nebulizer solution 2.5 mg    Patient Active Problem List   Diagnosis Date Noted   Skin tag 07/02/2020   Seborrheic keratosis 07/02/2020   Anxiety and depression 02/19/2020   Obstructive uropathy 08/01/2019   Gallstones 32/95/1884   Umbilical hernia 16/60/6301   Hematuria 07/15/2019   Hypertension associated with diabetes ([place]) 07/11/2019   Vitamin D deficiency 07/11/2019   Hiatal hernia 07/11/2019    Obesity (BMI 30.0-34.9) 07/11/2019   Heart failure with reduced ejection fraction (HCC)    GAD (generalized anxiety disorder) 04/06/2019   Proteinuria 03/04/2019   Aortic atherosclerosis ([place]) 03/04/2019   Thyroid nodule 03/04/2019   Kidney cysts 03/04/2019   Hemangioma of liver 03/04/2019   Sinusitis 03/04/2019   Kidney stones    CKD stage 3 secondary to diabetes ([place]) 08/22/2018   Laryngeal spasm 05/07/2018   Muscle tension dysphonia 05/07/2018   Shortness of breath 01/22/2018   Uncontrolled hypertension 01/22/2018   Type 2 diabetes mellitus without complication, without long-term current use of insulin ([place]) 01/22/2018   Mixed hyperlipidemia 01/22/2018   Mild persistent asthma 01/22/2018    Conditions to be addressed/monitored:CHF, HTN, and DMII  Care Plan : Diabetes/Heart Failure/Hypertension  Updates made by [name], RN since 07/22/2020 12:00 AM     Problem: Management of Diabetes/CHF/HTN   Priority: Medium     Long-Range Goal: Management of Diabetes/CHF/HTN   Start Date: 12/11/2019  Expected End Date: 02/02/2021  This Visit's Progress: On track  Recent Progress: On track  Priority: Medium  Note:   Objective:  Lab Results  Component Value Date   HGBA1C 6.3 07/15/2020   Lab Results  Component Value Date   CREATININE 1.31 (H) 07/15/2020   CREATININE 1.32 (H) 02/19/2020   CREATININE 1.19 11/12/2019  Current Barriers:  Knowledge deficit related to self care management of chronic medical conditions; Continues to weigh daily (, 187-190), monitor blood pressures daily (123/85, 120-150/80's), and check blood sugars about twice a day (97, 90-130's).  Has been very good with keeping vital sign log. Denies any lower extremity edema and denies any increase in shortness of breath.  Continues to have episodes with voice and has been taking Ativan as needed with some relief.  Just been prescribed Singular and plans to start medication today.  Unable to tolerate MRI and  plans to have procedure under sedation soon. Social issues and knowledge deficits affecting self care management of chronic conditions: Diabetes, Hypertension, Heart Failure  Case Manager Clinical Goal(s):  Collaboration with McLean-Scocuzza, Nino Glow, MD regarding development and update of comprehensive plan of care as evidenced by provider attestation and co-signature Inter-disciplinary care team collaboration (see longitudinal plan of care) Over the next 120days, patient will demonstrate improved adherence to prescribed treatment plan for diabetes self care/management as evidenced by: compliance with taking Trulicity-states she has been taking this medication as prescribed Encourage lifestyle changes, such as increased intake of plant-based foods, stress reduction, consistent physical activity and medication compliance to prevent long-term complications and chronic disease.   Encourage daily monitoring and recording of CBG, blood pressure, and weights Discuss importance of adherence to ADA/ carb modified Heart Healthy diet Discuss importance of adherence to prescribed medication regimen Case Manager Interventions:   General Reviewed medications with patient and discussed importance of medication adherence; discussed taking prescribed anxiety medication to help with voice and increase anxiety episodes Discussed plans with patient for ongoing care management follow up and provided patient with direct contact information for care management  team Discussed Medicaid still showing as insurance option and encouraged patient to contact Medicaid office to speak with worker to confirm Encouraged to continue to attend therapy/counseling sessions to help with depression Discussed using deep breathing exercises when feeling anxious Encouraged to keep and attend scheduled medical appointments Congratulated patient on keeping log of vitals for easy provider review Encouraged to keep and attend scheduled MRI  with sedation Encouraged to contact insurance to verify incontinence supply benefits Congratulated patient on contacting Medicaid to verify benefits Diabetes Advised patient, providing education and rationale, to check cbg at least twice a day and record, calling primary care provider or CCM Embedded Pharmacist for findings outside established parameters.   Congratulated patient on current CBG trends and encouraged to review what her goal A1C should be with provider Reviewed current glucose readings and Hgb A1C and discussed ways to keep them within range HTN Encouraged to continue to monitor blood pressures daily, and to notify provider for blood pressures elevations or lows CHF Continue to monitor weights daily, first thing in the morning when she wakens after voiding and to notify provider for 3 pound weight gain overnight or 5 pounds within 1 week  Encouraged to monitor feet, legs, and abdomen for swelling Patient Goals/Self-Care Activities Over the next 120 days, patient will:  General Attend Scheduled MRI appointment Contact insurance to verify incontinence supply benefits Diabetes Check blood sugar at least twice a day (before meals or 2 hours post eating) Check blood sugar if I feel it is too high or too low Enter blood sugar readings and medication into daily log and take log to provider appointments HTN Continue to monitor blood pressures daily, noting if blood pressures elevated if prior to taking medications and to recheck after taking morning medications CHF Continue to monitor weights daily and keeping in log Weigh self first thing in the mornings after voiding Notify provider for increase weight 3 pounds overnight of 5 pounds within 5 days or lower extremity swelling Follow Up Plan: The care management team will reach out to the patient again over the next 45 business days.        Plan:The care management team will reach out to the patient again over the next 45  business days.  Hubert Azure RN, MSN RN Care Management Coordinator [place] [phone] [email or web address]

## 2020-08-03 ENCOUNTER — Other Ambulatory Visit: Payer: Self-pay | Admitting: *Deleted

## 2020-08-03 MED ORDER — FARXIGA 10 MG PO TABS: 10.0000 mg | ORAL_TABLET | Freq: Every day | ORAL | 2 refills | Status: DC

## 2020-08-03 NOTE — Telephone Encounter (Signed)
Per Dr. Thereasa Solo 05/27/19 o/v   Start Farxiga 10 mg daily especially as patient is also a diabetic.

## 2020-08-03 NOTE — Telephone Encounter (Signed)
Received refill request for Wilder Glade, this is a historical medication never prescribed by a provider in this clinic. Please advise if refill is appropriate, if so please send in to the pharmacy on file. Thank you

## 2020-08-06 ENCOUNTER — Ambulatory Visit (INDEPENDENT_AMBULATORY_CARE_PROVIDER_SITE_OTHER): Payer: Medicare HMO | Admitting: Psychology

## 2020-08-06 DIAGNOSIS — F418 Other specified anxiety disorders: Secondary | ICD-10-CM

## 2020-08-06 DIAGNOSIS — R69 Illness, unspecified: Secondary | ICD-10-CM | POA: Diagnosis not present

## 2020-08-11 DIAGNOSIS — E119 Type 2 diabetes mellitus without complications: Secondary | ICD-10-CM | POA: Diagnosis not present

## 2020-08-11 DIAGNOSIS — E261 Secondary hyperaldosteronism: Secondary | ICD-10-CM | POA: Diagnosis not present

## 2020-08-11 DIAGNOSIS — I110 Hypertensive heart disease with heart failure: Secondary | ICD-10-CM | POA: Diagnosis not present

## 2020-08-11 DIAGNOSIS — M62838 Other muscle spasm: Secondary | ICD-10-CM | POA: Diagnosis not present

## 2020-08-11 DIAGNOSIS — K219 Gastro-esophageal reflux disease without esophagitis: Secondary | ICD-10-CM | POA: Diagnosis not present

## 2020-08-11 DIAGNOSIS — I509 Heart failure, unspecified: Secondary | ICD-10-CM | POA: Diagnosis not present

## 2020-08-11 DIAGNOSIS — E785 Hyperlipidemia, unspecified: Secondary | ICD-10-CM | POA: Diagnosis not present

## 2020-08-11 DIAGNOSIS — R69 Illness, unspecified: Secondary | ICD-10-CM | POA: Diagnosis not present

## 2020-08-14 ENCOUNTER — Telehealth: Payer: Self-pay | Admitting: Cardiology

## 2020-08-14 DIAGNOSIS — I428 Other cardiomyopathies: Secondary | ICD-10-CM

## 2020-08-14 MED ORDER — ENTRESTO 97-103 MG PO TABS: 1.0000 | ORAL_TABLET | Freq: Two times a day (BID) | ORAL | 0 refills | Status: DC

## 2020-08-14 NOTE — Telephone Encounter (Signed)
*  STAT* If patient is at the pharmacy, call can be transferred to refill team.   1. Which medications need to be refilled? (please list name of each medication and dose if known) Entresto 97-'103mg'$  1 tablet twice daily  2. Which pharmacy/location (including street and city if local pharmacy) is medication to be sent to? [facility], [address]  3. Do they need a 30 day or 90 day supply? 90 day

## 2020-08-14 NOTE — Telephone Encounter (Signed)
Requested Prescriptions   Signed Prescriptions Disp Refills   sacubitril-valsartan (ENTRESTO) 97-103 MG 180 tablet 0    Sig: Take 1 tablet by mouth 2 (two) times daily.    Authorizing Provider: Kate Sable    Ordering User: Raelene Bott, Egon Dittus L

## 2020-08-17 NOTE — Telephone Encounter (Signed)
This encounter was created in error - please disregard.

## 2020-08-19 ENCOUNTER — Other Ambulatory Visit: Payer: Self-pay | Admitting: Internal Medicine

## 2020-08-19 ENCOUNTER — Other Ambulatory Visit: Payer: Self-pay

## 2020-08-19 ENCOUNTER — Telehealth: Payer: Self-pay | Admitting: Primary Care

## 2020-08-19 ENCOUNTER — Encounter: Payer: Self-pay | Admitting: Primary Care

## 2020-08-19 ENCOUNTER — Ambulatory Visit (INDEPENDENT_AMBULATORY_CARE_PROVIDER_SITE_OTHER): Payer: Medicare HMO | Admitting: Primary Care

## 2020-08-19 DIAGNOSIS — J385 Laryngeal spasm: Secondary | ICD-10-CM

## 2020-08-19 MED ORDER — OMEPRAZOLE 20 MG PO CPDR: 20.0000 mg | DELAYED_RELEASE_CAPSULE | Freq: Two times a day (BID) | ORAL | 1 refills | Status: DC

## 2020-08-19 NOTE — Telephone Encounter (Signed)
Can we please change follow-up to 3 months with Dr. Patsey Berthold. She can try omeprazole '20mg'$  BID x 4-6 weeks to see if this helps with reflux and upper airway wheezing. I sent RX in to her pharmacy. Follow-up with neuro and ENT.

## 2020-08-19 NOTE — Patient Instructions (Signed)
Recommendations: - No changes today - Continue Singulair, nasal spray, Baclofen - We will call to schedule home sleep study as soon as we have an open date/time slot  - We may consider trial reflux medication called Prilosec, I will call you once I touch base with Dr. Darnell Level - Continue to use harmonica and/or ring/humming exercises   Follow-up: - I will ask Dr. Patsey Berthold when she wants you to follow-up, lets tentatively put you in for 6 months

## 2020-08-19 NOTE — Assessment & Plan Note (Signed)
-   Patient has laryngeal dystonia/spasmodic dysphonia. She is following with neurology and maintained on Baclofen. No underlying pulmonary conditions have been identified. We do not recommend any maintenance inhalers as this could potentially make her laryngeal symptoms worse. Advised she avoid mint/menthol products and use humming/singing exercises to help alleviate her symptoms. Continue to treat underlying PND symptoms with Singulair. May consider adding omeprazole for possible reflux that could be exacerbating her symptoms. We are awaiting home sleep study to be scheduled. FU in 6 months or sooner if needed.

## 2020-08-19 NOTE — Progress Notes (Signed)
_0  ID: Nancy Barajas, female    DOB: Sep 02, 1960, 60 y.o.   MRN: [id number]  No chief complaint on file.   Referring provider: McLean-Scocuzza, Olivia Mackie *  HPI: 60 year old female, former smoker quit in 2000. PMH significant for HTN, aortic atherosclerosis, heart failure, mild persistent asthma, laryngeal dystonia, CKD stage 3, type 2 diabetes, generalized anxiety, vit D deficiency. Patient of Dr. Patsey Berthold, last seen on 07/20/20. Maintained on Baclofen for spasmodic dysphonia.  08/19/2020- Interim hx Patient presents today for 4-6 week follow-up. During her last visit with DR. Patsey Berthold she was given trial of Singulair for PND symptoms. She has seen a minor improvement in sinus symptoms. She continues to have significant dysphonia. She is taking Baclofen for this and will be following up with neurology in the next month. She findings it hard to use harmonica at home as she feels she is bothering everyone. She feels that mint/menthol lozenges or gum help her symptoms. She has very occasional reflux symptoms on average twice a month. HST has not been scheduled.    Allergies  Allergen Reactions   Penicillins Rash    Did it involve swelling of the face/tongue/throat, SOB, or low BP? No Did it involve sudden or severe rash/hives, skin peeling, or any reaction on the inside of your mouth or nose? No Did you need to seek medical attention at a hospital or doctor's office? No When did it last happen?      Childhood If all above answers are "NO", may proceed with cephalosporin use.    Immunization History  Administered Date(s) Administered   Influenza,inj,Quad PF,6+ Mos 10/10/2017, 11/07/2018, 11/12/2019   PFIZER(Purple Top)SARS-COV-2 Vaccination 05/23/2019, 06/13/2019, 12/10/2019   Pneumococcal Polysaccharide-23 07/10/2019    Past Medical History:  Diagnosis Date   Anxiety    Asthma    Depression    Diabetes mellitus without complication ([place])    History of kidney stones     Hypertension    Kidney stones    Vertigo    when laying flat   Vocal cord dysfunction    makes it hard for patient to talk and breathe    Tobacco History: Social History   Tobacco Use  Smoking Status Former   Packs/day: 1.00   Years: 10.00   Pack years: 10.00   Types: Cigarettes   Quit date: 01/03/1998   Years since quitting: 22.6  Smokeless Tobacco Never   Counseling given: Not Answered   Outpatient Medications Prior to Visit  Medication Sig Dispense Refill   amLODipine (NORVASC) 5 MG tablet Take 1 tablet (5 mg total) by mouth daily. 30 tablet 5   baclofen (LIORESAL) 10 MG tablet Take 10 mg by mouth 2 (two) times daily.     blood glucose meter kit and supplies Dispense based on patient and insurance preference. Use twice daily as directed. (FOR ICD-10 E10.9, E11.9). 1 each 0   carvedilol (COREG) 25 MG tablet Take 1 tablet (25 mg total) by mouth 2 (two) times daily with a meal. 180 tablet 3   Cholecalciferol (VITAMIN D-3) 125 MCG (5000 UT) TABS Take 1 tablet by mouth daily. 90 tablet 3   Dulaglutide (TRULICITY) 1.5 DQ/2.2WL SOPN Inject 1.5 mg into the skin once a week. 6 mL 3   FARXIGA 10 MG TABS tablet Take 1 tablet (10 mg total) by mouth daily. 30 tablet 2   Lancets MISC 1 Device by Does not apply route in the morning, at noon, and at bedtime. Dispense for onetouch ultra 2  machine ONLY 300 each 11   LORazepam (ATIVAN) 0.5 MG tablet Take 0.5-1 tablets (0.25-0.5 mg total) by mouth daily as needed for anxiety. 30 tablet 5   montelukast (SINGULAIR) 10 MG tablet Take 1 tablet (10 mg total) by mouth daily. 30 tablet 2   Multiple Vitamins-Minerals (CENTRUM SILVER 50+WOMEN PO) Take 1 tablet by mouth daily.     ONETOUCH ULTRA test strip CHECK BLOOD SUGAR TWICE DAILY AS DIRECTED 200 each 0   rosuvastatin (CRESTOR) 10 MG tablet Take 1 tablet (10 mg total) by mouth every evening. 90 tablet 3   sacubitril-valsartan (ENTRESTO) 97-103 MG Take 1 tablet by mouth 2 (two) times daily. 180 tablet  0   sertraline (ZOLOFT) 100 MG tablet Take 1 tablet (100 mg total) by mouth daily. 90 tablet 3   sodium bicarbonate 650 MG tablet Take 1 tablet (650 mg total) by mouth 3 (three) times daily. 180 tablet 5   sodium chloride (OCEAN) 0.65 % SOLN nasal spray Place 1 spray into both nostrils as needed for congestion.      spironolactone (ALDACTONE) 25 MG tablet Take 1 tablet (25 mg total) by mouth daily. 90 tablet 2   Facility-Administered Medications Prior to Visit  Medication Dose Route Frequency Provider Last Rate Last Admin   albuterol (PROVENTIL) (2.5 MG/3ML) 0.083% nebulizer solution 2.5 mg  2.5 mg Nebulization Once Tyler Pita, MD          Review of Systems  Review of Systems   Physical Exam  BP 118/76 (BP Location: Left Arm, Patient Position: Sitting, Cuff Size: Normal)   Pulse 79   Temp 98.1 F (36.7 C) (Oral)   Ht _0  (1.753 m)   Wt 178 lb 9.6 oz (81 kg)   LMP 01/03/2013   SpO2 99%   BMI 26.37 kg/m  Physical Exam Constitutional:      Appearance: Normal appearance.  HENT:     Head: Normocephalic and atraumatic.     Mouth/Throat:     Comments: Deferred d/t masking Cardiovascular:     Rate and Rhythm: Normal rate and regular rhythm.  Pulmonary:     Effort: Pulmonary effort is normal.     Breath sounds: Wheezing present.     Comments: Slight exp wheeze to upper airway on exam  Musculoskeletal:        General: Normal range of motion.  Skin:    General: Skin is warm and dry.  Neurological:     General: No focal deficit present.     Mental Status: She is alert and oriented to person, place, and time. Mental status is at baseline.     Comments: Significant dysphonia   Psychiatric:        Mood and Affect: Mood normal.        Behavior: Behavior normal.        Thought Content: Thought content normal.        Judgment: Judgment normal.     Lab Results:  CBC    Component Value Date/Time   WBC 4.1 07/15/2020 0835   RBC 5.38 (H) 07/15/2020 0835   HGB 12.6  07/15/2020 0835   HGB 11.9 04/02/2019 1010   HCT 39.5 07/15/2020 0835   HCT 38.9 04/02/2019 1010   PLT 244.0 07/15/2020 0835   PLT 376 04/02/2019 1010   MCV 73.5 (L) 07/15/2020 0835   MCV 73 (L) 04/02/2019 1010   MCH 22.2 (L) 04/02/2019 1010   MCHC 31.9 07/15/2020 0835   RDW 16.3 (H) 07/15/2020  0835   RDW 16.7 (H) 04/02/2019 1010   LYMPHSABS 1.4 07/15/2020 0835   LYMPHSABS 1.8 04/02/2019 1010   MONOABS 0.4 07/15/2020 0835   EOSABS 0.1 07/15/2020 0835   EOSABS 0.1 04/02/2019 1010   BASOSABS 0.1 07/15/2020 0835   BASOSABS 0.1 04/02/2019 1010    BMET    Component Value Date/Time   NA 140 07/15/2020 0835   NA 138 04/02/2019 1010   K 4.3 07/15/2020 0835   CL 102 07/15/2020 0835   CO2 31 07/15/2020 0835   GLUCOSE 97 07/15/2020 0835   BUN 35 (H) 07/15/2020 0835   BUN 26 (H) 04/02/2019 1010   CREATININE 1.31 (H) 07/15/2020 0835   CALCIUM 9.6 07/15/2020 0835   GFRNONAA 49 (L) 05/14/2019 1000   GFRAA 57 (L) 05/14/2019 1000    BNP No results found for: BNP  ProBNP No results found for: PROBNP  Imaging: No results found.   Assessment & Plan:   Laryngeal spasm - Patient has laryngeal dystonia/spasmodic dysphonia. She is following with neurology and maintained on Baclofen. No underlying pulmonary conditions have been identified. We do not recommend any maintenance inhalers as this could potentially make her laryngeal symptoms worse. Advised she avoid mint/menthol products and use humming/singing exercises to help alleviate her symptoms. Continue to treat underlying PND symptoms with Singulair. May consider adding omeprazole for possible reflux that could be exacerbating her symptoms. We are awaiting home sleep study to be scheduled. FU in 6 months or sooner if needed.    Martyn Ehrich, NP 08/19/2020

## 2020-08-19 NOTE — Telephone Encounter (Signed)
I have attempted to call the pt but she hung up on me and did not answer when I called her back.

## 2020-08-19 NOTE — Telephone Encounter (Signed)
-----   Message from Tyler Pita, MD sent at 08/19/2020  1:17 PM EDT ----- Regarding: RE: laryngeal dystonia The "wheezing" is all from her dystonia and upper airway issues. Reflux RX ok this is really a neuro/ENT issue. She may need trach if Botox (which neuro is considering) does not help. She needs to continue f/u with [facility]. From our standpoint 3 no f/u is ok ----- Message ----- From: Martyn Ehrich, NP Sent: 08/19/2020   1:13 PM EDT To: Tyler Pita, MD Subject: laryngeal dystonia                             She is the same. Minor improvement in PND with Singulair. She had some upper airway wheezing on exam, she appreciated occasional reflux. Would you want to try something for possible reflux that could be exacerbating her symptoms. We are awaiting home sleep study to be scheduled.  When do you want her to follow-up?

## 2020-08-20 NOTE — Telephone Encounter (Signed)
I have called and LM on VM for the pt to call back about recs from EW>

## 2020-08-20 NOTE — Telephone Encounter (Signed)
Call made to patient, confirmed DOB. Made aware of recommendations. Made aware the omeprazole has been sent to [facility] in [place]. Voiced understanding.   Recall placed for 3 month f/u with CG.   Nothing further needed at this time.

## 2020-08-24 ENCOUNTER — Ambulatory Visit (INDEPENDENT_AMBULATORY_CARE_PROVIDER_SITE_OTHER): Payer: Medicare HMO | Admitting: *Deleted

## 2020-08-24 DIAGNOSIS — I10 Essential (primary) hypertension: Secondary | ICD-10-CM

## 2020-08-24 DIAGNOSIS — E119 Type 2 diabetes mellitus without complications: Secondary | ICD-10-CM

## 2020-08-24 NOTE — Chronic Care Management (AMB) (Signed)
Chronic Care Management   CCM RN Visit Note  08/24/2020 Name: Nancy Barajas MRN: [id number] DOB: [birth year]  Subjective: Nancy Barajas is a 60 y.o. year old female who is a primary care patient of McLean-Scocuzza, Nino Glow, MD. The care management team was consulted for assistance with disease management and care coordination needs.    Engaged with patient by telephone for follow up visit in response to provider referral for case management and/or care coordination services.   Consent to Services:  The patient was given information about Chronic Care Management services, agreed to services, and gave verbal consent prior to initiation of services.  Please see initial visit note for detailed documentation.   Patient agreed to services and verbal consent obtained.   Assessment: Review of patient past medical history, allergies, medications, health status, including review of consultants reports, laboratory and other test data, was performed as part of comprehensive evaluation and provision of chronic care management services.   SDOH (Social Determinants of Health) assessments and interventions performed:    CCM Care Plan  Allergies  Allergen Reactions   Penicillins Rash    Did it involve swelling of the face/tongue/throat, SOB, or low BP? No Did it involve sudden or severe rash/hives, skin peeling, or any reaction on the inside of your mouth or nose? No Did you need to seek medical attention at a hospital or doctor's office? No When did it last happen?      Childhood If all above answers are "NO", may proceed with cephalosporin use.    Outpatient Encounter Medications as of 08/24/2020  Medication Sig   amLODipine (NORVASC) 5 MG tablet Take 1 tablet (5 mg total) by mouth daily.   baclofen (LIORESAL) 10 MG tablet Take 10 mg by mouth 2 (two) times daily.   blood glucose meter kit and supplies Dispense based on patient and insurance preference. Use twice daily as directed. (FOR  ICD-10 E10.9, E11.9).   carvedilol (COREG) 25 MG tablet Take 1 tablet (25 mg total) by mouth 2 (two) times daily with a meal.   Cholecalciferol (VITAMIN D-3) 125 MCG (5000 UT) TABS Take 1 tablet by mouth daily.   Dulaglutide (TRULICITY) 1.5 HG/9.9ME SOPN Inject 1.5 mg into the skin once a week.   FARXIGA 10 MG TABS tablet Take 1 tablet (10 mg total) by mouth daily.   Lancets MISC 1 Device by Does not apply route in the morning, at noon, and at bedtime. Dispense for onetouch ultra 2 machine ONLY   LORazepam (ATIVAN) 0.5 MG tablet Take 0.5-1 tablets (0.25-0.5 mg total) by mouth daily as needed for anxiety.   montelukast (SINGULAIR) 10 MG tablet Take 1 tablet (10 mg total) by mouth daily.   Multiple Vitamins-Minerals (CENTRUM SILVER 50+WOMEN PO) Take 1 tablet by mouth daily.   omeprazole (PRILOSEC) 20 MG capsule Take 1 capsule (20 mg total) by mouth 2 (two) times daily before a meal.   ONETOUCH ULTRA test strip USE TO CHECK BLOOD SUGAR TWICE DAILY AS DIRECTED   rosuvastatin (CRESTOR) 10 MG tablet Take 1 tablet (10 mg total) by mouth every evening.   sacubitril-valsartan (ENTRESTO) 97-103 MG Take 1 tablet by mouth 2 (two) times daily.   sertraline (ZOLOFT) 100 MG tablet Take 1 tablet (100 mg total) by mouth daily.   sodium bicarbonate 650 MG tablet Take 1 tablet (650 mg total) by mouth 3 (three) times daily.   sodium chloride (OCEAN) 0.65 % SOLN nasal spray Place 1 spray into both nostrils as needed for congestion.  spironolactone (ALDACTONE) 25 MG tablet Take 1 tablet (25 mg total) by mouth daily.   Facility-Administered Encounter Medications as of 08/24/2020  Medication   albuterol (PROVENTIL) (2.5 MG/3ML) 0.083% nebulizer solution 2.5 mg    Patient Active Problem List   Diagnosis Date Noted   Skin tag 07/02/2020   Seborrheic keratosis 07/02/2020   Anxiety and depression 02/19/2020   Obstructive uropathy 08/01/2019   Gallstones 08/16/4816   Umbilical hernia 56/31/4970   Hematuria  07/15/2019   Hypertension associated with diabetes ([place]) 07/11/2019   Vitamin D deficiency 07/11/2019   Hiatal hernia 07/11/2019   Obesity (BMI 30.0-34.9) 07/11/2019   Heart failure with reduced ejection fraction (HCC)    GAD (generalized anxiety disorder) 04/06/2019   Proteinuria 03/04/2019   Aortic atherosclerosis ([place]) 03/04/2019   Thyroid nodule 03/04/2019   Kidney cysts 03/04/2019   Hemangioma of liver 03/04/2019   Sinusitis 03/04/2019   Kidney stones    CKD stage 3 secondary to diabetes ([place]) 08/22/2018   Laryngeal spasm 05/07/2018   Muscle tension dysphonia 05/07/2018   Shortness of breath 01/22/2018   Uncontrolled hypertension 01/22/2018   Type 2 diabetes mellitus without complication, without long-term current use of insulin ([place]) 01/22/2018   Mixed hyperlipidemia 01/22/2018   Mild persistent asthma 01/22/2018    Conditions to be addressed/monitored:HTN and DMII  Care Plan : Diabetes/Heart Failure/Hypertension  Updates made by [name], RN since 08/24/2020 12:00 AM     Problem: Management of Diabetes/CHF/HTN   Priority: Medium     Long-Range Goal: Management of Diabetes/CHF/HTN   Start Date: 12/11/2019  Expected End Date: 02/02/2021  This Visit's Progress: On track  Recent Progress: On track  Priority: Medium  Note:   Objective:  Lab Results  Component Value Date   HGBA1C 6.3 07/15/2020   Lab Results  Component Value Date   CREATININE 1.31 (H) 07/15/2020   CREATININE 1.32 (H) 02/19/2020   CREATININE 1.19 11/12/2019  Current Barriers:  Knowledge deficit related to self care management of chronic medical conditions;  monitor blood pressures daily (142/85, 110-160/70-94), and check blood sugars about twice a day (100, 80-110's).  Has been very good with keeping vital sign log. Denies any lower extremity edema and denies any increase in shortness of breath.  Continues to have episodes with voice and has been taking Ativan and Baclofen as needed with some  relief.  Unable to tolerate MRI and plans to have procedure under sedation soon. Social issues and knowledge deficits affecting self care management of chronic conditions: Diabetes, Hypertension, Heart Failure  Case Manager Clinical Goal(s):  Collaboration with McLean-Scocuzza, Nino Glow, MD regarding development and update of comprehensive plan of care as evidenced by provider attestation and co-signature Inter-disciplinary care team collaboration (see longitudinal plan of care) Over the next 120days, patient will demonstrate improved adherence to prescribed treatment plan for diabetes self care/management as evidenced by: compliance with taking Trulicity-states she has been taking this medication as prescribed Encourage lifestyle changes, such as increased intake of plant-based foods, stress reduction, consistent physical activity and medication compliance to prevent long-term complications and chronic disease.   Encourage daily monitoring and recording of CBG, blood pressure, and weights Discuss importance of adherence to ADA/ carb modified Heart Healthy diet Discuss importance of adherence to prescribed medication regimen Case Manager Interventions:   General Reviewed medications with patient and discussed importance of medication adherence; discussed taking prescribed anxiety medication to help with voice and increase anxiety episodes Discussed plans with patient for ongoing care management follow  up and provided patient with direct contact information for care management team Encouraged to continue to attend therapy/counseling sessions to help with depression Discussed using deep breathing exercises when feeling anxious Encouraged to keep and attend scheduled medical appointments Congratulated patient on keeping log of vitals for easy provider review Encouraged to keep and attend scheduled MRI with sedation Diabetes Advised patient, providing education and rationale, to check cbg at least  twice a day and record, calling primary care provider or CCM Embedded Pharmacist for findings outside established parameters.   Congratulated patient on current CBG trends and encouraged to review what her goal A1C should be with provider Reviewed current glucose readings and Hgb A1C and congratulated on current readings and discussed ways to keep them within range HTN Encouraged to continue to monitor blood pressures daily, and to notify provider for blood pressures elevations or lows CHF Continue to monitor weights daily, first thing in the morning when she wakens after voiding and to notify provider for 3 pound weight gain overnight or 5 pounds within 1 week  Encouraged to monitor feet, legs, and abdomen for swelling Patient Goals/Self-Care Activities Over the next 120 days, patient will:  General Attend Scheduled MRI appointment Contact insurance to verify incontinence supply benefits Diabetes Check blood sugar at least twice a day (before meals or 2 hours post eating) Check blood sugar if I feel it is too high or too low Enter blood sugar readings and medication into daily log and take log to provider appointments Set target A1C; current A1C decreased to 6.3(07/22) Discuss with PCP goal Hgb A1C  HTN Continue to monitor blood pressures daily, noting if blood pressures elevated if prior to taking medications and to recheck after taking morning medications CHF Continue to monitor weights daily and keeping in log Weigh self first thing in the mornings after voiding Notify provider for increase weight 3 pounds overnight of 5 pounds within 5 days or lower extremity swelling Follow Up Plan: The care management team will reach out to the patient again over the next 45 business days.        Plan:The care management team will reach out to the patient again over the next 45 business days.  Hubert Azure RN, MSN RN Care Management Coordinator [place] [phone] [email or web address]

## 2020-08-24 NOTE — Patient Instructions (Signed)
Visit Information  PATIENT GOALS:  Goals Addressed             This Visit's Progress    (CCM RN) Monitor and Manage My Blood Sugar, Blood Pressure, and Daily Weights   On track    Timeframe:  Long-Range Goal Priority:  Medium Start Date:  10/07/2019                           Expected End Date:  03/02/21         Follow Up: 09/28/20             Check blood sugar at least twice a day (before meals or 2 hours post eating) Check blood sugar if I feel it is too high or too low Enter blood sugar readings and medication into daily log and take log to provider appointments Continue to monitor weights daily and keeping in log Weigh self first thing in the mornings after voiding Notify provider for increase weight 3 pounds overnight of 5 pounds within 5 days or lower extremity swelling Continue to monitor blood pressures daily, noting if blood pressures elevated if prior to taking medications and to recheck after taking morning medications  Why is this important?   Checking your blood sugar at home helps to keep it from getting very high or very low.  Writing the results in a diary or log helps the doctor know how to care for you.  Your blood sugar log should have the time, date and the results.  Also, write down the amount of insulin or other medicine that you take.  Other information, like what you ate, exercise done and how you were feeling, will also be helpful.     Notes:       (CCM RN) Set My Target A1C-Diabetes Type 2   On track    Timeframe:  Long-Range Goal Priority:  Medium Start Date:   12/11/2019                          Expected End Date:  11/02/2020      Follow Up Date:  09/28/20                   Set target A1C; current A1C decreased to 6.3(07/22) Discuss with PCP goal Hgb A1C    Why is this important?   Your target A1C is decided together by you and your doctor.  It is based on several things like your age and other health issues.    Notes:         Patient  verbalizes understanding of instructions provided today and agrees to view in [place].   The care management team will reach out to the patient again over the next 45 business days.   Hubert Azure RN, MSN RN Care Management Coordinator [place] [phone] [email or web address]

## 2020-09-09 ENCOUNTER — Other Ambulatory Visit: Payer: Self-pay

## 2020-09-09 ENCOUNTER — Ambulatory Visit (INDEPENDENT_AMBULATORY_CARE_PROVIDER_SITE_OTHER): Payer: Medicare HMO | Admitting: Urology

## 2020-09-09 ENCOUNTER — Ambulatory Visit: Payer: Medicare HMO | Admitting: Psychology

## 2020-09-09 VITALS — BP 134/75 | HR 94 | Ht 69.0 in | Wt 179.7 lb

## 2020-09-09 DIAGNOSIS — Z87442 Personal history of urinary calculi: Secondary | ICD-10-CM

## 2020-09-09 DIAGNOSIS — N200 Calculus of kidney: Secondary | ICD-10-CM

## 2020-09-09 MED ORDER — SODIUM BICARBONATE 650 MG PO TABS: 650.0000 mg | ORAL_TABLET | Freq: Two times a day (BID) | ORAL | 11 refills | Status: DC

## 2020-09-09 NOTE — Progress Notes (Signed)
   09/09/2020 1:12 PM   Nikkol Conkel 14-May-1960 [id number]  Reason for visit: Follow up nephrolithiasis, overactive bladder  HPI: 60 year old female with over 47 prior stone episodes who most recently underwent left ureteroscopy, laser lithotripsy in September 2021 for a 2 cm left renal stone, with stone analysis showing 90% uric acid.  Follow-up renal ultrasound showed no evidence of stones or hydronephrosis.  She denies any stone episodes over the last year.  She has been on alkalinization therapy with sodium bicarbonate since that time, and has had no further episodes.  She has upset stomach taking it 3 times daily, and currently is using it twice a day.  Urinalysis today pending regarding pH to confirm alkalinization.  She previously has reported overactive bladder symptoms of urgency, frequency, and urge incontinence, and I had recommended oxybutynin 10 mg XL.  She never started this medication, and feels like her bladder symptoms have settled down recently and really complains of minimal urinary symptoms.  We discussed the risks and benefits of medications, and she would like to defer any medications at this time which is certainly reasonable.  She is drinking only water during the day, and we reviewed behavioral strategies.  Call with urine pH results Continue sodium bicarbonate RTC 1 year repeat UA and symptom check   Billey Co, MD  [facility] [address], [place],  [address] [phone]

## 2020-09-09 NOTE — Patient Instructions (Signed)
Dietary Guidelines to Help Prevent Kidney Stones Kidney stones are deposits of minerals and salts that form inside your kidneys. Your risk of developing kidney stones may be greater depending on your diet, your lifestyle, the medicines you take, and whether you have certain medical conditions. Most people can lower their chances of developing kidney stones by following the instructions below. Your dietitian may give you more specific instructions depending on your overall health and the type of kidney stones you tend to develop. What are tips for following this plan? Reading food labels  Choose foods with "no salt added" or "low-salt" labels. Limit your salt (sodium) intake to less than 1,500 mg a day. Choose foods with calcium for each meal and snack. Try to eat about 300 mg of calcium at each meal. Foods that contain 200-500 mg of calcium a serving include: 8 oz (237 mL) of milk, calcium-fortifiednon-dairy milk, and calcium-fortifiedfruit juice. Calcium-fortified means that calcium has been added to these drinks. 8 oz (237 mL) of kefir, yogurt, and soy yogurt. 4 oz (114 g) of tofu. 1 oz (28 g) of cheese. 1 cup (150 g) of dried figs. 1 cup (91 g) of cooked broccoli. One 3 oz (85 g) can of sardines or mackerel. Most people need 1,000-1,500 mg of calcium a day. Talk to your dietitian about how much calcium is recommended for you. Shopping Buy plenty of fresh fruits and vegetables. Most people do not need to avoid fruits and vegetables, even if these foods contain nutrients that may contribute to kidney stones. When shopping for convenience foods, choose: Whole pieces of fruit. Pre-made salads with dressing on the side. Low-fat fruit and yogurt smoothies. Avoid buying frozen meals or prepared deli foods. These can be high in sodium. Look for foods with live cultures, such as yogurt and kefir. Choose high-fiber grains, such as whole-wheat breads, oat bran, and wheat cereals. Cooking Do not add  salt to food when cooking. Place a salt shaker on the table and allow each person to add his or her own salt to taste. Use vegetable protein, such as beans, textured vegetable protein (TVP), or tofu, instead of meat in pasta, casseroles, and soups. Meal planning Eat less salt, if told by your dietitian. To do this: Avoid eating processed or pre-made food. Avoid eating fast food. Eat less animal protein, including cheese, meat, poultry, or fish, if told by your dietitian. To do this: Limit the number of times you have meat, poultry, fish, or cheese each week. Eat a diet free of meat at least 2 days a week. Eat only one serving each day of meat, poultry, fish, or seafood. When you prepare animal protein, cut pieces into small portion sizes. For most meat and fish, one serving is about the size of the palm of your hand. Eat at least five servings of fresh fruits and vegetables each day. To do this: Keep fruits and vegetables on hand for snacks. Eat one piece of fruit or a handful of berries with breakfast. Have a salad and fruit at lunch. Have two kinds of vegetables at dinner. Limit foods that are high in a substance called oxalate. These include: Spinach (cooked), rhubarb, beets, sweet potatoes, and Swiss chard. Peanuts. Potato chips, french fries, and baked potatoes with skin on. Nuts and nut products. Chocolate. If you regularly take a diuretic medicine, make sure to eat at least 1 or 2 servings of fruits or vegetables that are high in potassium each day. These include: Avocado. Banana. Orange, prune,   carrot, or tomato juice. Baked potato. Cabbage. Beans and split peas. Lifestyle  Drink enough fluid to keep your urine pale yellow. This is the most important thing you can do. Spread your fluid intake throughout the day. If you drink alcohol: Limit how much you use to: 0-1 drink a day for women who are not pregnant. 0-2 drinks a day for men. Be aware of how much alcohol is in your  drink. In the [place] one drink equals one 12 oz bottle of beer (355 mL), one 5 oz glass of wine (148 mL), or one 1 oz glass of hard liquor (44 mL). Lose weight if told by your health care provider. Work with your dietitian to find an eating plan and weight loss strategies that work best for you. General information Talk to your health care provider and dietitian about taking daily supplements. You may be told the following depending on your health and the cause of your kidney stones: Not to take supplements with vitamin C. To take a calcium supplement. To take a daily probiotic supplement. To take other supplements such as magnesium, fish oil, or vitamin B6. Take over-the-counter and prescription medicines only as told by your health care provider. These include supplements. What foods should I limit? Limit your intake of the following foods, or eat them as told by your dietitian. Vegetables Spinach. Rhubarb. Beets. Canned vegetables. Pickles. Olives. Baked potatoes with skin. Grains Wheat bran. Baked goods. Salted crackers. Cereals high in sugar. Meats and other proteins Nuts. Nut butters. Large portions of meat, poultry, or fish. Salted, precooked, or cured meats, such as sausages, meat loaves, and hot dogs. Dairy Cheese. Beverages Regular soft drinks. Regular vegetable juice. Seasonings and condiments Seasoning blends with salt. Salad dressings. Soy sauce. Ketchup. Barbecue sauce. Other foods Canned soups. Canned pasta sauce. Casseroles. Pizza. Lasagna. Frozen meals. Potato chips. French fries. The items listed above may not be a complete list of foods and beverages you should limit. Contact a dietitian for more information. What foods should I avoid? Talk to your dietitian about specific foods you should avoid based on the type of kidney stones you have and your overall health. Fruits Grapefruit. The item listed above may not be a complete list of foods and beverages you should  avoid. Contact a dietitian for more information. Summary Kidney stones are deposits of minerals and salts that form inside your kidneys. You can lower your risk of kidney stones by making changes to your diet. The most important thing you can do is drink enough fluid. Drink enough fluid to keep your urine pale yellow. Talk to your dietitian about how much calcium you should have each day, and eat less salt and animal protein as told by your dietitian. This information is not intended to replace advice given to you by your health care provider. Make sure you discuss any questions you have with your health care provider. Document Revised: 12/13/2018 Document Reviewed: 12/13/2018 Elsevier Patient Education  2022 Elsevier Inc.  

## 2020-09-10 ENCOUNTER — Telehealth: Payer: Self-pay

## 2020-09-10 LAB — URINALYSIS, COMPLETE
Bilirubin, UA: NEGATIVE
Ketones, UA: NEGATIVE
Leukocytes,UA: NEGATIVE
Nitrite, UA: NEGATIVE
Protein,UA: NEGATIVE
RBC, UA: NEGATIVE
Specific Gravity, UA: 1.015 (ref 1.005–1.030)
Urobilinogen, Ur: 0.2 mg/dL (ref 0.2–1.0)
pH, UA: 6 (ref 5.0–7.5)

## 2020-09-10 LAB — MICROSCOPIC EXAMINATION: RBC, Urine: NONE SEEN /hpf (ref 0–2)

## 2020-09-10 NOTE — Telephone Encounter (Signed)
-----   Message from Billey Co, MD sent at 09/10/2020  1:43 PM EDT ----- Urine looks perfect, keep follow-up as scheduled and continue the sodium bicarbonate for stone prevention  Nickolas Madrid, MD 09/10/2020

## 2020-09-11 ENCOUNTER — Telehealth: Payer: Self-pay | Admitting: Cardiology

## 2020-09-11 DIAGNOSIS — I428 Other cardiomyopathies: Secondary | ICD-10-CM

## 2020-09-11 MED ORDER — SPIRONOLACTONE 25 MG PO TABS: 25.0000 mg | ORAL_TABLET | Freq: Every day | ORAL | 0 refills | Status: DC

## 2020-09-11 NOTE — Telephone Encounter (Signed)
Requested Prescriptions   Signed Prescriptions Disp Refills   spironolactone (ALDACTONE) 25 MG tablet 90 tablet 0    Sig: Take 1 tablet (25 mg total) by mouth daily.    Authorizing Provider: Kate Sable    Ordering User: Britt Bottom

## 2020-09-11 NOTE — Telephone Encounter (Signed)
*  STAT* If patient is at the pharmacy, call can be transferred to refill team.   1. Which medications need to be refilled? (please list name of each medication and dose if known) spironolactone 25 MG 1 tablet daily  2. Which pharmacy/location (including street and city if local pharmacy) is medication to be sent to? [place]  3. Do they need a 30 day or 90 day supply? 90 day

## 2020-09-14 NOTE — Progress Notes (Signed)
Agree with the details of the visit as noted by Elizabeth Walsh, NP.  C. Laura Ariannah Arenson, MD [facility] 

## 2020-09-28 ENCOUNTER — Ambulatory Visit (INDEPENDENT_AMBULATORY_CARE_PROVIDER_SITE_OTHER): Payer: Medicare HMO | Admitting: *Deleted

## 2020-09-28 DIAGNOSIS — I428 Other cardiomyopathies: Secondary | ICD-10-CM

## 2020-09-28 DIAGNOSIS — I10 Essential (primary) hypertension: Secondary | ICD-10-CM

## 2020-09-28 DIAGNOSIS — E119 Type 2 diabetes mellitus without complications: Secondary | ICD-10-CM

## 2020-09-28 NOTE — Chronic Care Management (AMB) (Signed)
Chronic Care Management   CCM RN Visit Note  09/28/2020 Name: Nancy Barajas MRN: [id number] DOB: July 09, 1960  Subjective: Nancy Barajas is a 60 y.o. year old female who is a primary care patient of McLean-Scocuzza, Nino Glow, MD. The care management team was consulted for assistance with disease management and care coordination needs.    Engaged with patient face to face for follow up visit in response to provider referral for case management and/or care coordination services.   Consent to Services:  The patient was given information about Chronic Care Management services, agreed to services, and gave verbal consent prior to initiation of services.  Please see initial visit note for detailed documentation.   Patient agreed to services and verbal consent obtained.   Assessment: Review of patient past medical history, allergies, medications, health status, including review of consultants reports, laboratory and other test data, was performed as part of comprehensive evaluation and provision of chronic care management services.   SDOH (Social Determinants of Health) assessments and interventions performed:    CCM Care Plan  Allergies  Allergen Reactions   Penicillins Rash    Did it involve swelling of the face/tongue/throat, SOB, or low BP? No Did it involve sudden or severe rash/hives, skin peeling, or any reaction on the inside of your mouth or nose? No Did you need to seek medical attention at a hospital or doctor's office? No When did it last happen?      Childhood If all above answers are "NO", may proceed with cephalosporin use.    Outpatient Encounter Medications as of 09/28/2020  Medication Sig   amLODipine (NORVASC) 5 MG tablet Take 1 tablet (5 mg total) by mouth daily.   baclofen (LIORESAL) 10 MG tablet Take 10 mg by mouth 2 (two) times daily.   blood glucose meter kit and supplies Dispense based on patient and insurance preference. Use twice daily as directed. (FOR  ICD-10 E10.9, E11.9).   carvedilol (COREG) 25 MG tablet Take 1 tablet (25 mg total) by mouth 2 (two) times daily with a meal.   Cholecalciferol (VITAMIN D-3) 125 MCG (5000 UT) TABS Take 1 tablet by mouth daily.   Dulaglutide (TRULICITY) 1.5 XL/2.4MW SOPN Inject 1.5 mg into the skin once a week.   FARXIGA 10 MG TABS tablet Take 1 tablet (10 mg total) by mouth daily.   Lancets MISC 1 Device by Does not apply route in the morning, at noon, and at bedtime. Dispense for onetouch ultra 2 machine ONLY   LORazepam (ATIVAN) 0.5 MG tablet Take 0.5-1 tablets (0.25-0.5 mg total) by mouth daily as needed for anxiety.   montelukast (SINGULAIR) 10 MG tablet Take 1 tablet (10 mg total) by mouth daily.   Multiple Vitamins-Minerals (CENTRUM SILVER 50+WOMEN PO) Take 1 tablet by mouth daily.   omeprazole (PRILOSEC) 20 MG capsule Take 1 capsule (20 mg total) by mouth 2 (two) times daily before a meal.   ONETOUCH ULTRA test strip USE TO CHECK BLOOD SUGAR TWICE DAILY AS DIRECTED   rosuvastatin (CRESTOR) 10 MG tablet Take 1 tablet (10 mg total) by mouth every evening.   sacubitril-valsartan (ENTRESTO) 97-103 MG Take 1 tablet by mouth 2 (two) times daily.   sertraline (ZOLOFT) 100 MG tablet Take 1 tablet (100 mg total) by mouth daily.   sodium bicarbonate 650 MG tablet Take 1 tablet (650 mg total) by mouth 2 (two) times daily.   sodium chloride (OCEAN) 0.65 % SOLN nasal spray Place 1 spray into both nostrils as needed for  congestion.    spironolactone (ALDACTONE) 25 MG tablet Take 1 tablet (25 mg total) by mouth daily.   Facility-Administered Encounter Medications as of 09/28/2020  Medication   albuterol (PROVENTIL) (2.5 MG/3ML) 0.083% nebulizer solution 2.5 mg    Patient Active Problem List   Diagnosis Date Noted   Skin tag 07/02/2020   Seborrheic keratosis 07/02/2020   Anxiety and depression 02/19/2020   Obstructive uropathy 08/01/2019   Gallstones 54/27/0623   Umbilical hernia 76/28/3151   Hematuria  07/15/2019   Hypertension associated with diabetes ([facility]) 07/11/2019   Vitamin D deficiency 07/11/2019   Hiatal hernia 07/11/2019   Obesity (BMI 30.0-34.9) 07/11/2019   Heart failure with reduced ejection fraction (HCC)    GAD (generalized anxiety disorder) 04/06/2019   Proteinuria 03/04/2019   Aortic atherosclerosis ([facility]) 03/04/2019   Thyroid nodule 03/04/2019   Kidney cysts 03/04/2019   Hemangioma of liver 03/04/2019   Sinusitis 03/04/2019   Kidney stones    CKD stage 3 secondary to diabetes ([facility]) 08/22/2018   Laryngeal spasm 05/07/2018   Muscle tension dysphonia 05/07/2018   Shortness of breath 01/22/2018   Uncontrolled hypertension 01/22/2018   Type 2 diabetes mellitus without complication, without long-term current use of insulin ([place]) 01/22/2018   Mixed hyperlipidemia 01/22/2018   Mild persistent asthma 01/22/2018    Conditions to be addressed/monitored:HTN and DMII  Care Plan : Diabetes/Heart Failure/Hypertension  Updates made by [name], RN since 09/28/2020 12:00 AM     Problem: Management of Diabetes/CHF/HTN   Priority: Medium     Long-Range Goal: Management of Diabetes/CHF/HTN   Start Date: 12/11/2019  Expected End Date: 05/02/2021  This Visit's Progress: On track  Recent Progress: On track  Priority: Medium  Note:   Objective:  Lab Results  Component Value Date   HGBA1C 6.3 07/15/2020  Current Barriers:  Knowledge deficit related to self care management of chronic medical conditions;  monitor blood pressures daily (142/85, 142/85), and check blood sugars about twice a day (100, 70-110's).  Has been very good with keeping vital sign log. Denies any lower extremity edema and denies any increase in shortness of breath.  Continues to weigh self daily (did not reviw log)Continues to have episodes with voice and has been taking Ativan and Baclofen as needed with some relief.  Unable to tolerate MRI and plans to have procedure under sedation soon. Social  issues and knowledge deficits affecting self care management of chronic conditions: Diabetes, Hypertension, Heart Failure  Case Manager Clinical Goal(s):  Collaboration with McLean-Scocuzza, Nino Glow, MD regarding development and update of comprehensive plan of care as evidenced by provider attestation and co-signature Inter-disciplinary care team collaboration (see longitudinal plan of care) Over the next 120days, patient will demonstrate improved adherence to prescribed treatment plan for diabetes self care/management as evidenced by: compliance with taking Trulicity-states she has been taking this medication as prescribed Encourage lifestyle changes, such as increased intake of plant-based foods, stress reduction, consistent physical activity and medication compliance to prevent long-term complications and chronic disease.   Encourage daily monitoring and recording of CBG, blood pressure, and weights Discuss importance of adherence to ADA/ carb modified Heart Healthy diet Discuss importance of adherence to prescribed medication regimen Case Manager Interventions:   General Reviewed medications with patient and discussed importance of medication adherence; discussed taking prescribed anxiety medication to help with voice and increase anxiety episodes Discussed plans with patient for ongoing care management follow up and provided patient with direct contact information for care management team Encouraged  to continue to attend therapy/counseling sessions to help with depression Discussed using deep breathing exercises when feeling anxious Encouraged to keep and attend scheduled medical appointments Congratulated patient on keeping log of vitals for easy provider review Encouraged to keep and attend scheduled MRI with sedation Diabetes Advised patient, providing education and rationale, to check cbg at least twice a day and record, calling primary care provider or CCM Embedded Pharmacist for  findings outside established parameters.   Congratulated patient on current CBG trends and encouraged to review what her goal A1C should be with provider Reviewed current glucose readings and Hgb A1C and congratulated on current readings and discussed ways to keep them within range HTN Encouraged to continue to monitor blood pressures daily, and to notify provider for blood pressures elevations or lows CHF Continue to monitor weights daily, first thing in the morning when she wakens after voiding and to notify provider for 3 pound weight gain overnight or 5 pounds within 1 week  Encouraged to monitor feet, legs, and abdomen for swelling Patient Goals/Self-Care Activities Over the next 120 days, patient will:  General Attend Scheduled MRI appointment Contact insurance to verify incontinence supply benefits Contact pulmonology to let them know about medication denial Diabetes Check blood sugar at least twice a day (before meals or 2 hours post eating) Check blood sugar if I feel it is too high or too low Enter blood sugar readings and medication into daily log and take log to provider appointments Set target A1C; current A1C decreased to 6.3(07/22) Discuss with PCP goal Hgb A1C  HTN Continue to monitor blood pressures daily, noting if blood pressures elevated if prior to taking medications and to recheck after taking morning medications CHF Continue to monitor weights daily and keeping in log Weigh self first thing in the mornings after voiding Notify provider for increase weight 3 pounds overnight of 5 pounds within 5 days or lower extremity swelling Follow Up Plan: The care management team will reach out to the patient again over the next 45 business days.        Plan:The care management team will reach out to the patient again over the next 45 business days.  Hubert Azure RN, MSN RN Care Management Coordinator [place] [phone] [email or web address]

## 2020-09-28 NOTE — Patient Instructions (Signed)
Visit Information  PATIENT GOALS:  Goals Addressed             This Visit's Progress    (CCM RN) Monitor and Manage My Blood Sugar, Blood Pressure, and Daily Weights   On track    Timeframe:  Long-Range Goal Priority:  Medium Start Date:  10/07/2019                           Expected End Date:  05/02/21         Follow Up: 10/28/20             Check blood sugar at least twice a day (before meals or 2 hours post eating) Check blood sugar if I feel it is too high or too low Enter blood sugar readings and medication into daily log and take log to provider appointments Continue to monitor weights daily and keeping in log Weigh self first thing in the mornings after voiding Notify provider for increase weight 3 pounds overnight of 5 pounds within 5 days or lower extremity swelling Continue to monitor blood pressures daily, noting if blood pressures elevated if prior to taking medications and to recheck after taking morning medications Contact pulmonology to let them know about medication denial  Why is this important?   Checking your blood sugar at home helps to keep it from getting very high or very low.  Writing the results in a diary or log helps the doctor know how to care for you.  Your blood sugar log should have the time, date and the results.  Also, write down the amount of insulin or other medicine that you take.  Other information, like what you ate, exercise done and how you were feeling, will also be helpful.     Notes:       (CCM RN) Set My Target A1C-Diabetes Type 2   On track    Timeframe:  Long-Range Goal Priority:  Medium Start Date:   12/11/2019                          Expected End Date: 05/02/21    Follow Up Date:  10/28/20                   Set target A1C; current A1C decreased to 6.3(07/22) Discuss with PCP goal Hgb A1C    Why is this important?   Your target A1C is decided together by you and your doctor.  It is based on several things like your age and  other health issues.    Notes:         Patient verbalizes understanding of instructions provided today and agrees to view in [place].   The care management team will reach out to the patient again over the next 45 business days.   Hubert Azure RN, MSN RN Care Management Coordinator [place] [phone] [email or web address]

## 2020-09-30 ENCOUNTER — Ambulatory Visit: Payer: Medicare HMO | Admitting: Pharmacist

## 2020-09-30 DIAGNOSIS — I428 Other cardiomyopathies: Secondary | ICD-10-CM

## 2020-09-30 DIAGNOSIS — I5020 Unspecified systolic (congestive) heart failure: Secondary | ICD-10-CM

## 2020-09-30 DIAGNOSIS — I10 Essential (primary) hypertension: Secondary | ICD-10-CM

## 2020-09-30 DIAGNOSIS — N183 Chronic kidney disease, stage 3 unspecified: Secondary | ICD-10-CM

## 2020-09-30 DIAGNOSIS — E119 Type 2 diabetes mellitus without complications: Secondary | ICD-10-CM

## 2020-09-30 DIAGNOSIS — E1122 Type 2 diabetes mellitus with diabetic chronic kidney disease: Secondary | ICD-10-CM

## 2020-09-30 NOTE — Patient Instructions (Signed)
Nancy Barajas,   It was great talking to you today!  Schedule follow up with Dr. Garen Lah (cardiology) and Dr. Juleen China (nephrology)  Make sure you are resting with your feet flat on the floor for at least 5 minutes before you check your blood pressure. See enclosed information about the proper technique.   Talk to Dr. Garen Lah about a more stringent goal bad cholesterol (LDL) of <70.   We recommend you get the influenza vaccine for this season.   We recommend you get the updated bivalent COVID-19 booster, at least 2 months after any prior doses. You may consider delaying a booster dose by 3 months from a prior episode of COVID-19 per the CDC.   You can find pharmacies that have this formulation in stock at [email or web address].   Take care!  [name], PharmD  Visit Information  PATIENT GOALS:  Goals Addressed               This Visit's Progress     Patient Stated     Medication Monitoring (pt-stated)        Patient Goals/Self-Care Activities Over the next 90 days, patient will:  - take medications as prescribed check glucose twice daily, document, and provide at future appointments check blood pressure daily, document, and provide at future appointments        The patient verbalized understanding of instructions, educational materials, and care plan provided today and agreed to receive a mailed copy of patient instructions, educational materials, and care plan.   Plan: Telephone follow up appointment with care management team member scheduled for:  ~ 12 weeks  [name], PharmD, [employer], [place] Clinical Pharmacist [employer] [facility] [employer] [phone]

## 2020-09-30 NOTE — Chronic Care Management (AMB) (Signed)
Chronic Care Management Pharmacy Note  09/30/2020 Name:  Nancy Barajas MRN:  [id number] DOB:  [birth year]  Subjective: Nancy Barajas is an 60 y.o. year old female who is a primary patient of McLean-Scocuzza, Nino Glow, MD.  Collaborating with covering provider Tommi Rumps while PCP is on leave. The CCM team was consulted for assistance with disease management and care coordination needs.    Engaged with patient by telephone for follow up visit in response to provider referral for pharmacy case management and/or care coordination services.   Consent to Services:  The patient was given information about Chronic Care Management services, agreed to services, and gave verbal consent prior to initiation of services.  Please see initial visit note for detailed documentation.   Patient Care Team: McLean-Scocuzza, Nino Glow, MD as PCP - General (Internal Medicine) Kate Sable, MD as PCP - Cardiology (Cardiology) De Hollingshead, RPH-CPP as Pharmacist (Pharmacist) [name], RN as Case Manager   Objective:  Lab Results  Component Value Date   CREATININE 1.31 (H) 07/15/2020   CREATININE 1.32 (H) 02/19/2020   CREATININE 1.19 11/12/2019    Lab Results  Component Value Date   HGBA1C 6.3 07/15/2020   Last diabetic Eye exam:  Lab Results  Component Value Date/Time   HMDIABEYEEXA Retinopathy (A) 01/13/2020 12:00 AM    Last diabetic Foot exam: No results found for: HMDIABFOOTEX      Component Value Date/Time   CHOL 210 (H) 07/15/2020 0835   TRIG 86.0 07/15/2020 0835   HDL 94.50 07/15/2020 0835   CHOLHDL 2 07/15/2020 0835   VLDL 17.2 07/15/2020 0835   LDLCALC 98 07/15/2020 0835    Hepatic Function Latest Ref Rng & Units 07/15/2020 02/19/2020 11/12/2019  Total Protein 6.0 - 8.3 g/dL 7.1 7.3 6.9  Albumin 3.5 - 5.2 g/dL 4.4 4.2 4.2  AST 0 - 37 U/L _0 ALT 0 - 35 U/L _1 Alk Phosphatase 39 - 117 U/L 51 57 59  Total Bilirubin 0.2 - 1.2 mg/dL 0.7  0.8 0.6    Lab Results  Component Value Date/Time   TSH 0.85 11/12/2019 09:47 AM   TSH 0.93 11/07/2018 08:30 AM    CBC Latest Ref Rng & Units 07/15/2020 02/19/2020 11/12/2019  WBC 4.0 - 10.5 K/uL 4.1 4.3 4.9  Hemoglobin 12.0 - 15.0 g/dL 12.6 13.5 12.8  Hematocrit 36.0 - 46.0 % 39.5 42.6 40.3  Platelets 150.0 - 400.0 K/uL 244.0 262.0 284.0    Lab Results  Component Value Date/Time   VD25OH 67.04 07/15/2020 08:35 AM   VD25OH 52.82 07/10/2019 09:46 AM     Social History   Tobacco Use  Smoking Status Former   Packs/day: 1.00   Years: 10.00   Pack years: 10.00   Types: Cigarettes   Quit date: 01/03/1998   Years since quitting: 22.7  Smokeless Tobacco Never   BP Readings from Last 3 Encounters:  09/09/20 134/75  08/19/20 118/76  07/20/20 140/80   Pulse Readings from Last 3 Encounters:  09/09/20 94  08/19/20 79  07/20/20 88   Wt Readings from Last 3 Encounters:  09/09/20 179 lb 11.2 oz (81.5 kg)  08/19/20 178 lb 9.6 oz (81 kg)  07/20/20 180 lb 6.4 oz (81.8 kg)    Assessment: Review of patient past medical history, allergies, medications, health status, including review of consultants reports, laboratory and other test data, was performed as part of comprehensive evaluation and provision of chronic care management services.  SDOH:  (Social Determinants of Health) assessments and interventions performed:  SDOH Interventions    Flowsheet Row Most Recent Value  SDOH Interventions   Financial Strain Interventions Intervention Not Indicated       CCM Care Plan  Allergies  Allergen Reactions   Penicillins Rash    Did it involve swelling of the face/tongue/throat, SOB, or low BP? No Did it involve sudden or severe rash/hives, skin peeling, or any reaction on the inside of your mouth or nose? No Did you need to seek medical attention at a hospital or doctor's office? No When did it last happen?      Childhood If all above answers are "NO", may proceed with  cephalosporin use.    Medications Reviewed Today     Reviewed by De Hollingshead, RPH-CPP (Pharmacist) on 09/30/20 at 79  Med List Status: <None>   Medication Order Taking? Sig Documenting Provider Last Dose Status Informant  amLODipine (NORVASC) 5 MG tablet [id number] Yes Take 1 tablet (5 mg total) by mouth daily. Kate Sable, MD Taking Active   baclofen (LIORESAL) 10 MG tablet [id number] Yes Take 10 mg by mouth 2 (two) times daily. [provider] Taking Active   blood glucose meter kit and supplies [id number]  Dispense based on patient and insurance preference. Use twice daily as directed. (FOR ICD-10 E10.9, E11.9). McLean-Scocuzza, Nino Glow, MD  Active   carvedilol (COREG) 25 MG tablet [id number] Yes Take 1 tablet (25 mg total) by mouth 2 (two) times daily with a meal. McLean-Scocuzza, Nino Glow, MD Taking Active   Cholecalciferol (VITAMIN D-3) 125 MCG (5000 UT) TABS [id number] Yes Take 1 tablet by mouth daily. Jodelle Green, FNP Taking Active   Dulaglutide (TRULICITY) 1.5 ML/4.6TK SOPN [id number] Yes Inject 1.5 mg into the skin once a week. McLean-Scocuzza, Nino Glow, MD Taking Active   FARXIGA 10 MG TABS tablet [id number] Yes Take 1 tablet (10 mg total) by mouth daily. Kate Sable, MD Taking Active   Lancets [place] [id number]  1 Device by Does not apply route in the morning, at noon, and at bedtime. Dispense for onetouch ultra 2 machine ONLY McLean-Scocuzza, Nino Glow, MD  Active   LORazepam (ATIVAN) 0.5 MG tablet [id number] Yes Take 0.5-1 tablets (0.25-0.5 mg total) by mouth daily as needed for anxiety. McLean-Scocuzza, Nino Glow, MD Taking Active   montelukast (SINGULAIR) 10 MG tablet [id number] Yes Take 1 tablet (10 mg total) by mouth daily. Tyler Pita, MD Taking Active   Multiple Vitamins-Minerals (CENTRUM SILVER 50+WOMEN PO) [id number] Yes Take 1 tablet by mouth daily. [provider] Taking Active Self  omeprazole (PRILOSEC) 20 MG capsule [id number] No Take 1  capsule (20 mg total) by mouth 2 (two) times daily before a meal.  Patient not taking: Reported on 09/30/2020   Martyn Ehrich, NP Not Taking Active   [facility] ULTRA test strip [id number]  USE TO CHECK BLOOD SUGAR TWICE DAILY AS DIRECTED McLean-Scocuzza, Nino Glow, MD  Active   rosuvastatin (CRESTOR) 10 MG tablet [id number] Yes Take 1 tablet (10 mg total) by mouth every evening. McLean-Scocuzza, Nino Glow, MD Taking Active   sacubitril-valsartan [facility]) 97-103 MG [id number] Yes Take 1 tablet by mouth 2 (two) times daily. Kate Sable, MD Taking Active   sertraline (ZOLOFT) 100 MG tablet [id number] Yes Take 1 tablet (100 mg total) by mouth daily. McLean-Scocuzza, Nino Glow, MD Taking Active   sodium bicarbonate 650 MG tablet [id number] Yes Take 1 tablet (650 mg total) by mouth 2 (two)  times daily. Billey Co, MD Taking Active   sodium chloride (OCEAN) 0.65 % SOLN nasal spray [id number] Yes Place 1 spray into both nostrils as needed for congestion.  [provider] Taking Active Self  spironolactone (ALDACTONE) 25 MG tablet [id number] Yes Take 1 tablet (25 mg total) by mouth daily. Kate Sable, MD Taking Active             Patient Active Problem List   Diagnosis Date Noted   Skin tag 07/02/2020   Seborrheic keratosis 07/02/2020   Anxiety and depression 02/19/2020   Obstructive uropathy 08/01/2019   Gallstones 29/92/4268   Umbilical hernia 34/19/6222   Hematuria 07/15/2019   Hypertension associated with diabetes ([place]) 07/11/2019   Vitamin D deficiency 07/11/2019   Hiatal hernia 07/11/2019   Obesity (BMI 30.0-34.9) 07/11/2019   Heart failure with reduced ejection fraction (HCC)    GAD (generalized anxiety disorder) 04/06/2019   Proteinuria 03/04/2019   Aortic atherosclerosis ([place]) 03/04/2019   Thyroid nodule 03/04/2019   Kidney cysts 03/04/2019   Hemangioma of liver 03/04/2019   Sinusitis 03/04/2019   Kidney stones    CKD stage 3 secondary to diabetes ([place])  08/22/2018   Laryngeal spasm 05/07/2018   Muscle tension dysphonia 05/07/2018   Shortness of breath 01/22/2018   Uncontrolled hypertension 01/22/2018   Type 2 diabetes mellitus without complication, without long-term current use of insulin ([employer]) 01/22/2018   Mixed hyperlipidemia 01/22/2018   Mild persistent asthma 01/22/2018    Immunization History  Administered Date(s) Administered   Influenza,inj,Quad PF,6+ Mos 10/10/2017, 11/07/2018, 11/12/2019   PFIZER(Purple Top)SARS-COV-2 Vaccination 05/23/2019, 06/13/2019, 12/10/2019   Pneumococcal Polysaccharide-23 07/10/2019    Conditions to be addressed/monitored: CHF, CAD, HTN, HLD, and DMII  Care Plan : Medication Management (Pharmacist)  Updates made by De Hollingshead, RPH-CPP since 09/30/2020 12:00 AM     Problem: Diabetes, CKD, HF, HTN, Anxiety      Long-Range Goal: Disease Progression Prevention   Start Date: 01/30/2020  This Visit's Progress: On track  Recent Progress: On track  Priority: High  Note:   Current Barriers:  Unable to independently afford treatment regimen Complex patient with multiple disease states at increased risk for disease progression   Pharmacist Clinical Goal(s):  Over the next 90 days, patient will maintain control of diabetes and hypertension as evidenced by maintenance of lab work through collaboration with PharmD and provider.  Interventions: 1:1 collaboration with McLean-Scocuzza, Nino Glow, MD regarding development and update of comprehensive plan of care as evidenced by provider attestation and co-signature Inter-disciplinary care team collaboration (see longitudinal plan of care) Comprehensive medication review performed; medication list updated in electronic medical record   Health Maintenance Yearly diabetic eye exam: up to date Yearly diabetic foot exam: up to date Urine microalbumin: up to date Yearly influenza vaccination: due - recommended to pursue yearly dose Td/Tdap  vaccination: due - will recommend moving forward  Pneumonia vaccination: up to date COVID vaccinations: due  - recommended to pursue at her local pharmacy Shingrix vaccinations: due - recommended to pursue at local pharmacy Colonoscopy: due - previously declined Bone density scan: up to date Mammogram: up to date  Diabetes: Controlled per last A1c, current treatment: Trulicity 1.5 mg, Farxiga 10 mg daily   Avoiding metformin d/t CKD  Current glucose readings: fasting/preprandial: 90-110; post prandial: 90s Praised for maintenance of goal glucose  Continue to monitor glucose at least BID - fasting and 2 hours after meals. Patient verbalizes understanding Recmmend to continue current regimen at this  time.    Heart Failure, most recent EF 40%, prior 25-30%: Appropriately managed; current treatment; follows with Dr. Garen Lah cardiology and nephrology  ARNI/ACEi/ARB: Delene Loll 97/103 mg BID Beta blocker: carvedilol 25 mg BID SGLT2: Farxiga 10 mg daily Mineralocorticoid Receptor Antagonist: spironolactone 25 mg daily Diuretic: none Additional antihypertensives: amlodipine 5 mg daily Current home vitals: 110-150/70-80, HR 70s Current home weights:  180-182 lbs Confirms understanding of importance of weighing daily. Confirms understanding to contact cardiology/primary care team if weight gain >3 lbs in 1 day or >5 lbs in 1 week Reviewed appropriate BP checking technique. Patient notes she is not always resting for a full 5 minutes before checking BP. Advised to do so before checking, bring readings to f/u appointments. She is due for f/u with cardiology and overdue for f/u with nephrology, she was advised to schedule these follow ups in the next several weeks.   Hyperlipidemia: Controlled at goal LDL <100, though more stringent goal of <70 may be appropriate; current treatment: rosuvastatin 10 mg daily  Reviewed recommendation for more stringent LDL <70 given DM + HTN, CKD, aortic  atherosclerosis. Patient will discuss with cardiology team and/or PCP moving forward.  Recommended to continue current regimen.  Hx Kidney Stones, Urinary Incontinence: Controlled per patient report; current regimen: sodium bicarbonate 650 mg TID Previously recommended to continue current regimen at this time.    Depression/Anxiety: Improved per patient report; current treatment: sertraline 100 mg daily; lorazepam 0.25-0.5 mg daily, taking QAM. Follows w/ psychiatry, has not established with a new psychiarist since Dr. Mamie Nick left and LCSW Alene Mires.  Previously recommended to establish with a new psychiatrist to determine optimal treatment for anxiety. Continue collaboration with LCSW.  Vocal Cord Dysphonia: Uncontrolled, work up from neurology, pulmonary; current regimen: baclofen 10 mg BID; on monteukast 10 mg daily ofr allergic rhnitis. Omeprazole 20 mg BID added by pulmonary at last appointment - patient notes that she received notice from insurance that quantiy limit was exceeded so she has not received that medication. Will message pulmonary to let them know that quantiy exception likely needs to be completed.  Previously recommened to continue collaboration with neurology, primary care, pulmonary.  Supplement: Vitamin D 1000 units, Multivitamin    Patient Goals/Self-Care Activities Over the next 90 days, patient will:  - take medications as prescribed check glucose twice daily, document, and provide at future appointments check blood pressure daily, document, and provide at future appointments  Follow Up Plan: Telephone follow up appointment with care management team member scheduled for: ~ 12 weeks     Medication Assistance: None required.  Patient affirms current coverage meets needs.  Patient's preferred pharmacy is:  [facility] [address] ([address]), [place] [address] [place] ([place]) [place] [address] Phone: [phone] Fax:  [phone]    Follow Up:  Patient agrees to Care Plan and Follow-up.  Plan: Telephone follow up appointment with care management team member scheduled for:  ~ 12 weeks  [name], PharmD, [place], [place] Clinical Pharmacist [employer] [facility] [employer] [phone]

## 2020-10-02 DIAGNOSIS — E1122 Type 2 diabetes mellitus with diabetic chronic kidney disease: Secondary | ICD-10-CM

## 2020-10-02 DIAGNOSIS — E119 Type 2 diabetes mellitus without complications: Secondary | ICD-10-CM

## 2020-10-02 DIAGNOSIS — I5020 Unspecified systolic (congestive) heart failure: Secondary | ICD-10-CM

## 2020-10-02 DIAGNOSIS — I10 Essential (primary) hypertension: Secondary | ICD-10-CM

## 2020-10-02 DIAGNOSIS — N183 Chronic kidney disease, stage 3 unspecified: Secondary | ICD-10-CM

## 2020-10-06 ENCOUNTER — Other Ambulatory Visit: Payer: Self-pay

## 2020-10-06 ENCOUNTER — Ambulatory Visit (INDEPENDENT_AMBULATORY_CARE_PROVIDER_SITE_OTHER): Payer: Medicare HMO

## 2020-10-06 DIAGNOSIS — I428 Other cardiomyopathies: Secondary | ICD-10-CM | POA: Diagnosis not present

## 2020-10-06 LAB — ECHOCARDIOGRAM COMPLETE
AR max vel: 2.18 cm2
AV Area VTI: 2.38 cm2
AV Area mean vel: 2.06 cm2
AV Mean grad: 3 mmHg
AV Peak grad: 6.3 mmHg
Ao pk vel: 1.25 m/s
Area-P 1/2: 5.27 cm2
Calc EF: 42.6 %
S' Lateral: 3.38 cm
Single Plane A2C EF: 41.3 %
Single Plane A4C EF: 45.5 %

## 2020-10-11 ENCOUNTER — Other Ambulatory Visit: Payer: Self-pay | Admitting: Pulmonary Disease

## 2020-10-14 ENCOUNTER — Ambulatory Visit (INDEPENDENT_AMBULATORY_CARE_PROVIDER_SITE_OTHER): Payer: Medicare HMO | Admitting: Psychology

## 2020-10-14 DIAGNOSIS — F418 Other specified anxiety disorders: Secondary | ICD-10-CM | POA: Diagnosis not present

## 2020-10-14 DIAGNOSIS — R69 Illness, unspecified: Secondary | ICD-10-CM | POA: Diagnosis not present

## 2020-10-16 DIAGNOSIS — J383 Other diseases of vocal cords: Secondary | ICD-10-CM | POA: Diagnosis not present

## 2020-10-19 ENCOUNTER — Other Ambulatory Visit: Payer: Self-pay

## 2020-10-19 ENCOUNTER — Ambulatory Visit: Payer: Medicare HMO

## 2020-10-19 DIAGNOSIS — G4733 Obstructive sleep apnea (adult) (pediatric): Secondary | ICD-10-CM | POA: Diagnosis not present

## 2020-10-19 DIAGNOSIS — Z9189 Other specified personal risk factors, not elsewhere classified: Secondary | ICD-10-CM

## 2020-10-22 ENCOUNTER — Telehealth: Payer: Self-pay | Admitting: Pulmonary Disease

## 2020-10-22 DIAGNOSIS — G4733 Obstructive sleep apnea (adult) (pediatric): Secondary | ICD-10-CM | POA: Diagnosis not present

## 2020-10-22 DIAGNOSIS — G4730 Sleep apnea, unspecified: Secondary | ICD-10-CM

## 2020-10-22 NOTE — Telephone Encounter (Signed)
Dr. Patsey Berthold the home sleep test results have been scanned into Epic

## 2020-10-22 NOTE — Telephone Encounter (Signed)
Per sleep study she has severe obstructive sleep apnea.  She will need auto CPAP at 5 to 20 cm H2O with mask of choice.  She will also need overnight oximetry on CPAP to determine if she needs supplemental oxygen.

## 2020-10-23 NOTE — Telephone Encounter (Signed)
Called and spoke to patient about Dr Domingo Dimes recommendation in regards to the cpap machine. Patient is in agreement. Will also place order for ONO. Nothing further needed.

## 2020-10-23 NOTE — Telephone Encounter (Signed)
Lm for patient.  

## 2020-10-28 ENCOUNTER — Ambulatory Visit (INDEPENDENT_AMBULATORY_CARE_PROVIDER_SITE_OTHER): Payer: Medicare HMO | Admitting: *Deleted

## 2020-10-28 DIAGNOSIS — E119 Type 2 diabetes mellitus without complications: Secondary | ICD-10-CM

## 2020-10-28 DIAGNOSIS — I5020 Unspecified systolic (congestive) heart failure: Secondary | ICD-10-CM

## 2020-10-28 DIAGNOSIS — E1159 Type 2 diabetes mellitus with other circulatory complications: Secondary | ICD-10-CM

## 2020-10-28 NOTE — Patient Instructions (Signed)
Visit Information  PATIENT GOALS:  Goals Addressed             This Visit's Progress    (CCM RN) Monitor and Manage My Blood Sugar, Blood Pressure, and Daily Weights   On track    Timeframe:  Long-Range Goal Priority:  Medium Start Date:  10/07/2019                           Expected End Date:  05/02/21         Follow Up: 11/23/20             Check blood sugar at least twice a day (before meals or 2 hours post eating) Check blood sugar if I feel it is too high or too low Enter blood sugar readings and medication into daily log and take log to provider appointments Continue to monitor weights daily and keeping in log Weigh self first thing in the mornings after voiding Notify provider for increase weight 3 pounds overnight of 5 pounds within 5 days or lower extremity swelling Continue to monitor blood pressures daily, noting if blood pressures elevated if prior to taking medications and to recheck after taking morning medications Contact pulmonology if CPAP does not get delivered  Why is this important?   Checking your blood sugar at home helps to keep it from getting very high or very low.  Writing the results in a diary or log helps the doctor know how to care for you.  Your blood sugar log should have the time, date and the results.  Also, write down the amount of insulin or other medicine that you take.  Other information, like what you ate, exercise done and how you were feeling, will also be helpful.     Notes:       (CCM RN) Set My Target A1C-Diabetes Type 2   On track    Timeframe:  Long-Range Goal Priority:  Medium Start Date:   12/11/2019                          Expected End Date: 05/02/21    Follow Up Date:  11/23/20                   Set target A1C; current A1C decreased to 6.3(07/22) Discuss with PCP goal Hgb A1C    Why is this important?   Your target A1C is decided together by you and your doctor.  It is based on several things like your age and other  health issues.    Notes:         Patient verbalizes understanding of instructions provided today and agrees to view in [place].   The care management team will reach out to the patient again over the next 30 days.   Hubert Azure RN, MSN RN Care Management Coordinator [place] [phone] [email or web address]

## 2020-10-28 NOTE — Chronic Care Management (AMB) (Signed)
Chronic Care Management   CCM RN Visit Note  10/28/2020 Name: Nancy Barajas MRN: [id number] DOB: 03-08-1960  Subjective: Nancy Barajas is a 60 y.o. year old female who is a primary care patient of McLean-Scocuzza, Nino Glow, MD. The care management team was consulted for assistance with disease management and care coordination needs.    Engaged with patient by telephone for follow up visit in response to provider referral for case management and/or care coordination services.   Consent to Services:  The patient was given information about Chronic Care Management services, agreed to services, and gave verbal consent prior to initiation of services.  Please see initial visit note for detailed documentation.   Patient agreed to services and verbal consent obtained.   Assessment: Review of patient past medical history, allergies, medications, health status, including review of consultants reports, laboratory and other test data, was performed as part of comprehensive evaluation and provision of chronic care management services.   SDOH (Social Determinants of Health) assessments and interventions performed:    CCM Care Plan  Allergies  Allergen Reactions   Penicillins Rash    Did it involve swelling of the face/tongue/throat, SOB, or low BP? No Did it involve sudden or severe rash/hives, skin peeling, or any reaction on the inside of your mouth or nose? No Did you need to seek medical attention at a hospital or doctor's office? No When did it last happen?      Childhood If all above answers are "NO", may proceed with cephalosporin use.    Outpatient Encounter Medications as of 10/28/2020  Medication Sig   amLODipine (NORVASC) 5 MG tablet Take 1 tablet (5 mg total) by mouth daily.   baclofen (LIORESAL) 10 MG tablet Take 10 mg by mouth 2 (two) times daily.   blood glucose meter kit and supplies Dispense based on patient and insurance preference. Use twice daily as directed. (FOR  ICD-10 E10.9, E11.9).   carvedilol (COREG) 25 MG tablet Take 1 tablet (25 mg total) by mouth 2 (two) times daily with a meal.   Cholecalciferol (VITAMIN D-3) 125 MCG (5000 UT) TABS Take 1 tablet by mouth daily.   Dulaglutide (TRULICITY) 1.5 MO/2.9UT SOPN Inject 1.5 mg into the skin once a week.   FARXIGA 10 MG TABS tablet Take 1 tablet (10 mg total) by mouth daily.   Lancets MISC 1 Device by Does not apply route in the morning, at noon, and at bedtime. Dispense for onetouch ultra 2 machine ONLY   LORazepam (ATIVAN) 0.5 MG tablet Take 0.5-1 tablets (0.25-0.5 mg total) by mouth daily as needed for anxiety.   montelukast (SINGULAIR) 10 MG tablet Take 1 tablet by mouth once daily   Multiple Vitamins-Minerals (CENTRUM SILVER 50+WOMEN PO) Take 1 tablet by mouth daily.   omeprazole (PRILOSEC) 20 MG capsule Take 1 capsule (20 mg total) by mouth 2 (two) times daily before a meal. (Patient not taking: Reported on 09/30/2020)   ONETOUCH ULTRA test strip USE TO CHECK BLOOD SUGAR TWICE DAILY AS DIRECTED   rosuvastatin (CRESTOR) 10 MG tablet Take 1 tablet (10 mg total) by mouth every evening.   sacubitril-valsartan (ENTRESTO) 97-103 MG Take 1 tablet by mouth 2 (two) times daily.   sertraline (ZOLOFT) 100 MG tablet Take 1 tablet (100 mg total) by mouth daily.   sodium bicarbonate 650 MG tablet Take 1 tablet (650 mg total) by mouth 2 (two) times daily.   sodium chloride (OCEAN) 0.65 % SOLN nasal spray Place 1 spray into both nostrils  as needed for congestion.    spironolactone (ALDACTONE) 25 MG tablet Take 1 tablet (25 mg total) by mouth daily.   Facility-Administered Encounter Medications as of 10/28/2020  Medication   albuterol (PROVENTIL) (2.5 MG/3ML) 0.083% nebulizer solution 2.5 mg    Patient Active Problem List   Diagnosis Date Noted   Skin tag 07/02/2020   Seborrheic keratosis 07/02/2020   Anxiety and depression 02/19/2020   Obstructive uropathy 08/01/2019   Gallstones 88/50/2774   Umbilical  hernia 12/87/8676   Hematuria 07/15/2019   Hypertension associated with diabetes ([place]) 07/11/2019   Vitamin D deficiency 07/11/2019   Hiatal hernia 07/11/2019   Obesity (BMI 30.0-34.9) 07/11/2019   Heart failure with reduced ejection fraction (HCC)    GAD (generalized anxiety disorder) 04/06/2019   Proteinuria 03/04/2019   Aortic atherosclerosis ([place]) 03/04/2019   Thyroid nodule 03/04/2019   Kidney cysts 03/04/2019   Hemangioma of liver 03/04/2019   Sinusitis 03/04/2019   Kidney stones    CKD stage 3 secondary to diabetes ([place]) 08/22/2018   Laryngeal spasm 05/07/2018   Muscle tension dysphonia 05/07/2018   Shortness of breath 01/22/2018   Uncontrolled hypertension 01/22/2018   Type 2 diabetes mellitus without complication, without long-term current use of insulin ([place]) 01/22/2018   Mixed hyperlipidemia 01/22/2018   Mild persistent asthma 01/22/2018    Conditions to be addressed/monitored:HTN and DMII  Care Plan : Diabetes/Heart Failure/Hypertension  Updates made by [name], RN since 10/28/2020 12:00 AM     Problem: Management of Diabetes/CHF/HTN   Priority: Medium     Long-Range Goal: Management of Diabetes/CHF/HTN   Start Date: 12/11/2019  Expected End Date: 05/02/2021  This Visit's Progress: On track  Recent Progress: On track  Priority: Medium  Note:   Objective:  Lab Results  Component Value Date   HGBA1C 6.3 07/15/2020  Current Barriers:  Knowledge deficit related to self care management of chronic medical conditions;  monitor blood pressures daily (110-140/70-80), and check blood sugars about twice a day (101, 80-90's).  Has been very good with keeping vital sign log. Denies any lower extremity edema and denies any increase in shortness of breath.  Continues to weigh self daily (did not review log)Continues to have episodes with voice and has been taking Ativan and Baclofen (dose increased) as needed with some relief.   Social issues and knowledge  deficits affecting self care management of chronic conditions: Diabetes, Hypertension, Heart Failure  Case Manager Clinical Goal(s):  Collaboration with McLean-Scocuzza, Nino Glow, MD regarding development and update of comprehensive plan of care as evidenced by provider attestation and co-signature Inter-disciplinary care team collaboration (see longitudinal plan of care) Over the next 120days, patient will demonstrate improved adherence to prescribed treatment plan for diabetes self care/management as evidenced by: compliance with taking Trulicity-states she has been taking this medication as prescribed Encourage lifestyle changes, such as increased intake of plant-based foods, stress reduction, consistent physical activity and medication compliance to prevent long-term complications and chronic disease.   Encourage daily monitoring and recording of CBG, blood pressure, and weights Discuss importance of adherence to ADA/ carb modified Heart Healthy diet Discuss importance of adherence to prescribed medication regimen Case Manager Interventions:   General Reviewed medications with patient and discussed importance of medication adherence; discussed taking prescribed anxiety medication to help with voice and increase anxiety episodes Discussed plans with patient for ongoing care management follow up and provided patient with direct contact information for care management team Encouraged to continue to attend therapy/counseling sessions to  help with depression Discussed using deep breathing exercises when feeling anxious Encouraged to keep and attend scheduled medical appointments Congratulated patient on keeping log of vitals for easy provider review Encouraged to keep and attend scheduled MRI with sedation Diabetes Advised patient, providing education and rationale, to check cbg at least twice a day and record, calling primary care provider or CCM Embedded Pharmacist for findings outside established  parameters.   Congratulated patient on current CBG trends and encouraged to review what her goal A1C should be with provider Reviewed current glucose readings and Hgb A1C and congratulated on current readings and discussed ways to keep them within range HTN Encouraged to continue to monitor blood pressures daily, and to notify provider for blood pressures elevations or lows CHF Continue to monitor weights daily, first thing in the morning when she wakens after voiding and to notify provider for 3 pound weight gain overnight or 5 pounds within 1 week  Encouraged to monitor feet, legs, and abdomen for swelling Patient Goals/Self-Care Activities Over the next 120 days, patient will:  General Contact pulmonology if CPAP does not get deliveredContact pulmonology to let them know about medication denial Diabetes Check blood sugar at least twice a day (before meals or 2 hours post eating) Check blood sugar if I feel it is too high or too low Enter blood sugar readings and medication into daily log and take log to provider appointments Set target A1C; current A1C decreased to 6.3(07/22) Discuss with PCP goal Hgb A1C  HTN Continue to monitor blood pressures daily, noting if blood pressures elevated if prior to taking medications and to recheck after taking morning medications CHF Continue to monitor weights daily and keeping in log Weigh self first thing in the mornings after voiding Notify provider for increase weight 3 pounds overnight of 5 pounds within 5 days or lower extremity swelling Follow Up Plan: The care management team will reach out to the patient again over the next 45 business days.        Plan:The care management team will reach out to the patient again over the next 30 days. Hubert Azure RN, MSN RN Care Management Coordinator [place] [phone] [email or web address]

## 2020-10-29 DIAGNOSIS — R809 Proteinuria, unspecified: Secondary | ICD-10-CM | POA: Diagnosis not present

## 2020-10-29 DIAGNOSIS — R82998 Other abnormal findings in urine: Secondary | ICD-10-CM | POA: Diagnosis not present

## 2020-10-29 DIAGNOSIS — N1831 Chronic kidney disease, stage 3a: Secondary | ICD-10-CM | POA: Diagnosis not present

## 2020-10-29 DIAGNOSIS — N2581 Secondary hyperparathyroidism of renal origin: Secondary | ICD-10-CM | POA: Diagnosis not present

## 2020-10-29 DIAGNOSIS — D631 Anemia in chronic kidney disease: Secondary | ICD-10-CM | POA: Diagnosis not present

## 2020-11-02 DIAGNOSIS — E1159 Type 2 diabetes mellitus with other circulatory complications: Secondary | ICD-10-CM

## 2020-11-02 DIAGNOSIS — I152 Hypertension secondary to endocrine disorders: Secondary | ICD-10-CM | POA: Diagnosis not present

## 2020-11-02 DIAGNOSIS — E119 Type 2 diabetes mellitus without complications: Secondary | ICD-10-CM

## 2020-11-02 DIAGNOSIS — I5020 Unspecified systolic (congestive) heart failure: Secondary | ICD-10-CM | POA: Diagnosis not present

## 2020-11-03 DIAGNOSIS — D631 Anemia in chronic kidney disease: Secondary | ICD-10-CM | POA: Diagnosis not present

## 2020-11-03 DIAGNOSIS — E1129 Type 2 diabetes mellitus with other diabetic kidney complication: Secondary | ICD-10-CM | POA: Diagnosis not present

## 2020-11-03 DIAGNOSIS — N1831 Chronic kidney disease, stage 3a: Secondary | ICD-10-CM | POA: Diagnosis not present

## 2020-11-03 DIAGNOSIS — I129 Hypertensive chronic kidney disease with stage 1 through stage 4 chronic kidney disease, or unspecified chronic kidney disease: Secondary | ICD-10-CM | POA: Diagnosis not present

## 2020-11-03 DIAGNOSIS — R809 Proteinuria, unspecified: Secondary | ICD-10-CM | POA: Diagnosis not present

## 2020-11-03 DIAGNOSIS — N2581 Secondary hyperparathyroidism of renal origin: Secondary | ICD-10-CM | POA: Diagnosis not present

## 2020-11-05 NOTE — Telephone Encounter (Signed)
Created in error

## 2020-11-06 ENCOUNTER — Ambulatory Visit (INDEPENDENT_AMBULATORY_CARE_PROVIDER_SITE_OTHER): Payer: Medicare HMO | Admitting: Cardiology

## 2020-11-06 ENCOUNTER — Encounter: Payer: Self-pay | Admitting: Cardiology

## 2020-11-06 ENCOUNTER — Other Ambulatory Visit: Payer: Self-pay

## 2020-11-06 VITALS — BP 116/70 | HR 90 | Ht 71.0 in | Wt 178.0 lb

## 2020-11-06 DIAGNOSIS — I428 Other cardiomyopathies: Secondary | ICD-10-CM

## 2020-11-06 DIAGNOSIS — I10 Essential (primary) hypertension: Secondary | ICD-10-CM | POA: Diagnosis not present

## 2020-11-06 DIAGNOSIS — E7800 Pure hypercholesterolemia, unspecified: Secondary | ICD-10-CM | POA: Diagnosis not present

## 2020-11-06 MED ORDER — FARXIGA 10 MG PO TABS: 10.0000 mg | ORAL_TABLET | Freq: Every day | ORAL | 3 refills | Status: DC

## 2020-11-06 NOTE — Patient Instructions (Signed)

## 2020-11-06 NOTE — Progress Notes (Signed)
Cardiology Office Note:    Date:  11/06/2020   ID:  Nancy Barajas, DOB July 07, 1960, MRN [id number]  PCP:  McLean-Scocuzza, Nino Glow, MD  Cardiologist:  Kate Sable, MD  Electrophysiologist:  None   Referring MD: McLean-Scocuzza, Olivia Mackie *   Chief Complaint  Patient presents with   Other    3 month follow up. Meds reviewed verbally with patient.      History of Present Illness:    Nancy Barajas is a 60 y.o. female with a hx of hypertension, NICM, initial EF 25-30%, improved to 40-45% on GDMT, diabetes, hyperlipidemia, CKD stage III, who presents for follow-up.    Being seen for nonischemic cardiomyopathy and hypertension.  Echocardiogram was obtained 10/06/2020 to evaluate ejection fraction.  On optimal GDMT included Coreg 25 mg twice daily, Aldactone 25 mg daily and, Entresto 97-103 mg twice daily, Farxiga 10 mg daily.  Has no edema, breathing is okay.  Presents for echocardiogram results.   Prior notes Echo 03/20/2019 EF 25 to 30% Left heart catheter 04/2019 no evidence of CAD, normal coronary arteries Echo 09/2019 EF 40 to 45%.   Past Medical History:  Diagnosis Date   Anxiety    Asthma    Depression    Diabetes mellitus without complication ([place])    History of kidney stones    Hypertension    Kidney stones    Vertigo    when laying flat   Vocal cord dysfunction    makes it hard for patient to talk and breathe    Past Surgical History:  Procedure Laterality Date   CARDIAC CATHETERIZATION     CYSTOSCOPY W/ RETROGRADES Right 09/27/2019   Procedure: CYSTOSCOPY WITH RETROGRADE PYELOGRAM;  Surgeon: Billey Co, MD;  Location: [facility];  Service: Urology;  Laterality: Right;   CYSTOSCOPY/URETEROSCOPY/HOLMIUM LASER/STENT PLACEMENT Left 09/27/2019   Procedure: CYSTOSCOPY/URETEROSCOPY/HOLMIUM LASER/STENT PLACEMENT;  Surgeon: Billey Co, MD;  Location: [facility];  Service: Urology;  Laterality: Left;   LITHOTRIPSY     RIGHT/LEFT HEART CATH AND CORONARY  ANGIOGRAPHY Bilateral 04/08/2019   Procedure: RIGHT/LEFT HEART CATH AND CORONARY ANGIOGRAPHY;  Surgeon: Wellington Hampshire, MD;  Location: [facility];  Service: Cardiovascular;  Laterality: Bilateral;    Current Medications: Current Meds  Medication Sig   amLODipine (NORVASC) 5 MG tablet Take 1 tablet (5 mg total) by mouth daily.   baclofen (LIORESAL) 10 MG tablet Take 20 mg by mouth 2 (two) times daily.   blood glucose meter kit and supplies Dispense based on patient and insurance preference. Use twice daily as directed. (FOR ICD-10 E10.9, E11.9).   carvedilol (COREG) 25 MG tablet Take 1 tablet (25 mg total) by mouth 2 (two) times daily with a meal.   Cholecalciferol (VITAMIN D-3) 125 MCG (5000 UT) TABS Take 1 tablet by mouth daily.   Dulaglutide (TRULICITY) 1.5 GM/0.1UU SOPN Inject 1.5 mg into the skin once a week.   Lancets MISC 1 Device by Does not apply route in the morning, at noon, and at bedtime. Dispense for onetouch ultra 2 machine ONLY   LORazepam (ATIVAN) 0.5 MG tablet Take 0.5-1 tablets (0.25-0.5 mg total) by mouth daily as needed for anxiety.   montelukast (SINGULAIR) 10 MG tablet Take 1 tablet by mouth once daily   Multiple Vitamins-Minerals (CENTRUM SILVER 50+WOMEN PO) Take 1 tablet by mouth daily.   omeprazole (PRILOSEC) 20 MG capsule Take 1 capsule (20 mg total) by mouth 2 (two) times daily before a meal.   ONETOUCH ULTRA test strip  USE TO CHECK BLOOD SUGAR TWICE DAILY AS DIRECTED   rosuvastatin (CRESTOR) 10 MG tablet Take 1 tablet (10 mg total) by mouth every evening.   sacubitril-valsartan (ENTRESTO) 97-103 MG Take 1 tablet by mouth 2 (two) times daily.   sertraline (ZOLOFT) 100 MG tablet Take 1 tablet (100 mg total) by mouth daily.   sodium bicarbonate 650 MG tablet Take 1 tablet (650 mg total) by mouth 2 (two) times daily.   sodium chloride (OCEAN) 0.65 % SOLN nasal spray Place 1 spray into both nostrils as needed for congestion.    spironolactone (ALDACTONE) 25  MG tablet Take 1 tablet (25 mg total) by mouth daily.   [DISCONTINUED] FARXIGA 10 MG TABS tablet Take 1 tablet (10 mg total) by mouth daily.     Allergies:   Penicillins   Social History   Socioeconomic History   Marital status: Divorced    Spouse name: Not on file   Number of children: 2   Years of education: Not on file   Highest education level: 9th grade  Occupational History   Not on file  Tobacco Use   Smoking status: Former    Packs/day: 1.00    Years: 10.00    Pack years: 10.00    Types: Cigarettes    Quit date: 01/03/1998    Years since quitting: 22.8   Smokeless tobacco: Never  Vaping Use   Vaping Use: Never used  Substance and Sexual Activity   Alcohol use: Not Currently   Drug use: Not on file    Comment: years ago   Sexual activity: Not Currently  Other Topics Concern   Not on file  Social History Narrative   Lives at home    2 daughters as of 04/04/19 49 y.o daughter lives with her (she works)   Patient is on disability.   Social Determinants of Health   Financial Resource Strain: Low Risk    Difficulty of Paying Living Expenses: Not hard at all  Food Insecurity: Food Insecurity Present   Worried About Charity fundraiser in the Last Year: Sometimes true   Arboriculturist in the Last Year: Sometimes true  Transportation Needs: No Transportation Needs   Lack of Transportation (Medical): No   Lack of Transportation (Non-Medical): No  Physical Activity: Not on file  Stress: Stress Concern Present   Feeling of Stress : To some extent  Social Connections: Not on file     Family History: The patient's family history includes Diabetes in her father and mother; Hypertension in her father and mother. There is no history of Breast cancer.  ROS:   Please see the history of present illness.     All other systems reviewed and are negative.  EKGs/Labs/Other Studies Reviewed:    The following studies were reviewed today:   EKG:  EKG is  ordered today.   The ekg ordered today demonstrates normal sinus rhythm, left bundle branch block  Recent Labs: 11/12/2019: TSH 0.85 07/15/2020: ALT 13; BUN 35; Creatinine, Ser 1.31; Hemoglobin 12.6; Platelets 244.0; Potassium 4.3; Sodium 140  Recent Lipid Panel    Component Value Date/Time   CHOL 210 (H) 07/15/2020 0835   TRIG 86.0 07/15/2020 0835   HDL 94.50 07/15/2020 0835   CHOLHDL 2 07/15/2020 0835   VLDL 17.2 07/15/2020 0835   LDLCALC 98 07/15/2020 0835    Physical Exam:    VS:  BP 116/70 (BP Location: Left Arm, Patient Position: Sitting, Cuff Size: Normal)   Pulse  90   Ht '5\' 11"'  (1.803 m)   Wt 178 lb (80.7 kg)   LMP 01/03/2013   SpO2 98%   BMI 24.83 kg/m     Wt Readings from Last 3 Encounters:  11/06/20 178 lb (80.7 kg)  09/09/20 179 lb 11.2 oz (81.5 kg)  08/19/20 178 lb 9.6 oz (81 kg)     GEN:  Well nourished, well developed in no acute distress, obese HEENT: Normal NECK: No JVD; No carotid bruits LYMPHATICS: No lymphadenopathy CARDIAC: RRR, no murmurs, rubs, gallops RESPIRATORY:  Clear to auscultation without rales, wheezing or rhonchi  ABDOMEN: Soft, non-tender, non-distended MUSCULOSKELETAL:  No edema; No deformity  SKIN: Warm and dry NEUROLOGIC:  Alert and oriented x 3 PSYCHIATRIC:  Normal affect   ASSESSMENT:    1. NICM (nonischemic cardiomyopathy) ([place])   2. Primary hypertension   3. Pure hypercholesterolemia      PLAN:    In order of problems listed above:  Nonischemic cardiomyopathy, initial EF 25 to 30%.  Previous EF 40%.  Last echo 10/2020 EF improved now 50 to 55%.  Continue Coreg, Entresto, Aldactone, Farxiga.  She is euvolemic, describes NYHA class II symptoms. hypertension, blood pressure controlled.  Continue Coreg 25 mg twice daily, Entresto, Aldactone 75m daily, Norvasc 5 mg daily.   hyperlipidemia, continue Crestor.  Follow-up in 6-12 months.   This note was generated in part or whole with voice recognition software. Voice recognition is usually  quite accurate but there are transcription errors that can and very often do occur. I apologize for any typographical errors that were not detected and corrected.  Medication Adjustments/Labs and Tests Ordered: Current medicines are reviewed at length with the patient today.  Concerns regarding medicines are outlined above.  Orders Placed This Encounter  Procedures   EKG 12-Lead     Meds ordered this encounter  Medications   FARXIGA 10 MG TABS tablet    Sig: Take 1 tablet (10 mg total) by mouth daily.    Dispense:  90 tablet    Refill:  3      Patient Instructions  Medication Instructions:  Your physician recommends that you continue on your current medications as directed. Please refer to the Current Medication list given to you today.  *If you need a refill on your cardiac medications before your next appointment, please call your pharmacy*   Lab Work: None ordered If you have labs (blood work) drawn today and your tests are completely normal, you will receive your results only by: [place](if you have MyChart) OR A paper copy in the mail If you have any lab test that is abnormal or we need to change your treatment, we will call you to review the results.   Testing/Procedures: None ordered   Follow-Up: [facility] our [facility].  As part of our continuing mission to provide you with exceptional heart care, we have created designated Provider Care Teams.  These Care Teams include your primary Cardiologist (physician) and Advanced Practice Providers (APPs -  Physician Assistants and Nurse Practitioners) who all work together to provide you with the care you need, when you need it.  We recommend signing up for the patient portal called "MyChart".  Sign up information is provided on this After Visit Summary.  MyChart is used to connect with patients for Virtual Visits (Telemedicine).  Patients are able to view lab/test results,  encounter notes, upcoming appointments, etc.  Non-urgent messages can be sent  to your provider as well.   To learn more about what you can do with MyChart, go to [email or web address].    Your next appointment:   1 year(s)  The format for your next appointment:   In Person  Provider:   You may see Kate Sable, MD or one of the following Advanced Practice Providers on your designated Care Team:   Murray Hodgkins, NP Christell Faith, PA-C Cadence Kathlen Mody, [place]    Other Instructions    Signed, Kate Sable, MD  11/06/2020 3:10 PM    [place]

## 2020-11-09 DIAGNOSIS — G259 Extrapyramidal and movement disorder, unspecified: Secondary | ICD-10-CM | POA: Diagnosis not present

## 2020-11-19 ENCOUNTER — Ambulatory Visit (INDEPENDENT_AMBULATORY_CARE_PROVIDER_SITE_OTHER): Payer: Medicare HMO | Admitting: Psychology

## 2020-11-19 DIAGNOSIS — R69 Illness, unspecified: Secondary | ICD-10-CM | POA: Diagnosis not present

## 2020-11-19 DIAGNOSIS — F418 Other specified anxiety disorders: Secondary | ICD-10-CM | POA: Diagnosis not present

## 2020-11-23 ENCOUNTER — Other Ambulatory Visit: Payer: Self-pay | Admitting: *Deleted

## 2020-11-23 ENCOUNTER — Ambulatory Visit (INDEPENDENT_AMBULATORY_CARE_PROVIDER_SITE_OTHER): Payer: Medicare HMO | Admitting: *Deleted

## 2020-11-23 DIAGNOSIS — I428 Other cardiomyopathies: Secondary | ICD-10-CM

## 2020-11-23 DIAGNOSIS — I10 Essential (primary) hypertension: Secondary | ICD-10-CM

## 2020-11-23 DIAGNOSIS — I5020 Unspecified systolic (congestive) heart failure: Secondary | ICD-10-CM

## 2020-11-23 DIAGNOSIS — E119 Type 2 diabetes mellitus without complications: Secondary | ICD-10-CM

## 2020-11-23 MED ORDER — ENTRESTO 97-103 MG PO TABS: 1.0000 | ORAL_TABLET | Freq: Two times a day (BID) | ORAL | 1 refills | Status: DC

## 2020-11-29 NOTE — Patient Instructions (Signed)
Visit Information  Thank you for taking time to visit with me today. Please don't hesitate to contact me if I can be of assistance to you before our next scheduled telephone appointment.  Following are the goals we discussed today:  General Contact pulmonology if CPAP does not get deliveredContact pulmonology to let them know about medication denial Diabetes Check blood sugar at least twice a day (before meals or 2 hours post eating) Check blood sugar if I feel it is too high or too low Enter blood sugar readings and medication into daily log and take log to provider appointments Set target A1C; current A1C decreased to 6.3(07/22) Discuss with PCP goal Hgb A1C  HTN Continue to monitor blood pressures daily, noting if blood pressures elevated if prior to taking medications and to recheck after taking morning medications CHF Continue to monitor weights daily and keeping in log Weigh self first thing in the mornings after voiding Notify provider for increase weight 3 pounds overnight of 5 pounds within 5 days or lower extremity swelling  Our next appointment is by telephone on 12/23/20 at 1130  Please call the care guide team at [phone] if you need to cancel or reschedule your appointment.   Please call the Suicide and Crisis Lifeline: 988 call the [place] National Suicide Prevention Lifeline: 601-160-4497 or TTY: 620-132-4033 TTY 215-675-9983) to talk to a trained counselor call 1-800-273-TALK (toll free, 24 hour hotline) go to [facility] [address] [place] [phone]) call 911 if you are experiencing a Mental Health or [place] or need someone to talk to.  Patient verbalizes understanding of instructions provided today and agrees to view in [place].   Hubert Azure RN, MSN RN Care Management Coordinator [place] [phone] [email or web address]

## 2020-11-29 NOTE — Chronic Care Management (AMB) (Signed)
Chronic Care Management   CCM RN Visit Note  11/29/2020 Name: Nancy Barajas MRN: [id number] DOB: February 19, 1960  Subjective: Nancy Barajas is a 60 y.o. year old female who is a primary care patient of McLean-Scocuzza, Nino Glow, MD. The care management team was consulted for assistance with disease management and care coordination needs.    Engaged with patient by telephone for follow up visit in response to provider referral for case management and/or care coordination services.   Consent to Services:  The patient was given information about Chronic Care Management services, agreed to services, and gave verbal consent prior to initiation of services.  Please see initial visit note for detailed documentation.   Patient agreed to services and verbal consent obtained.   Assessment: Review of patient past medical history, allergies, medications, health status, including review of consultants reports, laboratory and other test data, was performed as part of comprehensive evaluation and provision of chronic care management services.   SDOH (Social Determinants of Health) assessments and interventions performed:    CCM Care Plan  Allergies  Allergen Reactions   Penicillins Rash    Did it involve swelling of the face/tongue/throat, SOB, or low BP? No Did it involve sudden or severe rash/hives, skin peeling, or any reaction on the inside of your mouth or nose? No Did you need to seek medical attention at a hospital or doctor's office? No When did it last happen?      Childhood If all above answers are "NO", may proceed with cephalosporin use.    Outpatient Encounter Medications as of 11/23/2020  Medication Sig Note   amLODipine (NORVASC) 5 MG tablet Take 1 tablet (5 mg total) by mouth daily.    baclofen (LIORESAL) 10 MG tablet Take 20 mg by mouth 2 (two) times daily.    blood glucose meter kit and supplies Dispense based on patient and insurance preference. Use twice daily as  directed. (FOR ICD-10 E10.9, E11.9).    carvedilol (COREG) 25 MG tablet Take 1 tablet (25 mg total) by mouth 2 (two) times daily with a meal.    Cholecalciferol (VITAMIN D-3) 125 MCG (5000 UT) TABS Take 1 tablet by mouth daily.    Dulaglutide (TRULICITY) 1.5 IZ/1.2WP SOPN Inject 1.5 mg into the skin once a week.    FARXIGA 10 MG TABS tablet Take 1 tablet (10 mg total) by mouth daily.    Lancets MISC 1 Device by Does not apply route in the morning, at noon, and at bedtime. Dispense for onetouch ultra 2 machine ONLY    LORazepam (ATIVAN) 0.5 MG tablet Take 0.5-1 tablets (0.25-0.5 mg total) by mouth daily as needed for anxiety.    montelukast (SINGULAIR) 10 MG tablet Take 1 tablet by mouth once daily    Multiple Vitamins-Minerals (CENTRUM SILVER 50+WOMEN PO) Take 1 tablet by mouth daily.    omeprazole (PRILOSEC) 20 MG capsule Take 1 capsule (20 mg total) by mouth 2 (two) times daily before a meal.    ONETOUCH ULTRA test strip USE TO CHECK BLOOD SUGAR TWICE DAILY AS DIRECTED    rosuvastatin (CRESTOR) 10 MG tablet Take 1 tablet (10 mg total) by mouth every evening.    sacubitril-valsartan (ENTRESTO) 97-103 MG Take 1 tablet by mouth 2 (two) times daily.    sertraline (ZOLOFT) 100 MG tablet Take 1 tablet (100 mg total) by mouth daily. (Patient not taking: Reported on 11/23/2020) 11/23/2020: Reports no longer taking due feeling like the medication was not helping   sodium bicarbonate 650 MG  tablet Take 1 tablet (650 mg total) by mouth 2 (two) times daily.    sodium chloride (OCEAN) 0.65 % SOLN nasal spray Place 1 spray into both nostrils as needed for congestion.     spironolactone (ALDACTONE) 25 MG tablet Take 1 tablet (25 mg total) by mouth daily.    Facility-Administered Encounter Medications as of 11/23/2020  Medication   albuterol (PROVENTIL) (2.5 MG/3ML) 0.083% nebulizer solution 2.5 mg    Patient Active Problem List   Diagnosis Date Noted   Skin tag 07/02/2020   Seborrheic keratosis  07/02/2020   Anxiety and depression 02/19/2020   Obstructive uropathy 08/01/2019   Gallstones 63/33/5456   Umbilical hernia 25/63/8937   Hematuria 07/15/2019   Hypertension associated with diabetes ([place]) 07/11/2019   Vitamin D deficiency 07/11/2019   Hiatal hernia 07/11/2019   Obesity (BMI 30.0-34.9) 07/11/2019   Heart failure with reduced ejection fraction (HCC)    GAD (generalized anxiety disorder) 04/06/2019   Proteinuria 03/04/2019   Aortic atherosclerosis ([place]) 03/04/2019   Thyroid nodule 03/04/2019   Kidney cysts 03/04/2019   Hemangioma of liver 03/04/2019   Sinusitis 03/04/2019   Kidney stones    CKD stage 3 secondary to diabetes ([place]) 08/22/2018   Laryngeal spasm 05/07/2018   Muscle tension dysphonia 05/07/2018   Shortness of breath 01/22/2018   Uncontrolled hypertension 01/22/2018   Type 2 diabetes mellitus without complication, without long-term current use of insulin ([place]) 01/22/2018   Mixed hyperlipidemia 01/22/2018   Mild persistent asthma 01/22/2018    Conditions to be addressed/monitored:CHF, HTN, and DMII  Care Plan : Diabetes/Heart Failure/Hypertension  Updates made by [name], RN since 11/29/2020 12:00 AM     Problem: Management of Diabetes/CHF/HTN   Priority: Medium     Long-Range Goal: Management of Diabetes/CHF/HTN   Start Date: 12/11/2019  Expected End Date: 05/02/2021  This Visit's Progress: On track  Recent Progress: On track  Priority: Medium  Note:   Objective:  Lab Results  Component Value Date   HGBA1C 6.3 07/15/2020  Current Barriers:  Knowledge deficit related to self care management of chronic medical conditions;  monitor blood pressures daily (128/87), and check blood sugars about twice a day (102, 80-100's).  Has been very good with keeping vital sign log. Denies any lower extremity edema and denies any increase in shortness of breath.  Continues to weigh self daily (did not review log)Continues to have episodes with voice  and has been taking Ativan and Baclofen (dose increased) as needed with some relief. Has seen specialist at [facility].  Patient a little sad and frustrated with lack of diagnosis and treatment options  Social issues and knowledge deficits affecting self care management of chronic conditions: Diabetes, Hypertension, Heart Failure  Case Manager Clinical Goal(s):  Collaboration with McLean-Scocuzza, Nino Glow, MD regarding development and update of comprehensive plan of care as evidenced by provider attestation and co-signature Inter-disciplinary care team collaboration (see longitudinal plan of care) Over the next 120days, patient will demonstrate improved adherence to prescribed treatment plan for diabetes self care/management as evidenced by: compliance with taking Trulicity-states she has been taking this medication as prescribed Encourage lifestyle changes, such as increased intake of plant-based foods, stress reduction, consistent physical activity and medication compliance to prevent long-term complications and chronic disease.   Encourage daily monitoring and recording of CBG, blood pressure, and weights Discuss importance of adherence to ADA/ carb modified Heart Healthy diet Discuss importance of adherence to prescribed medication regimen Case Manager Interventions:   General Reviewed  medications with patient and discussed importance of medication adherence; discussed taking prescribed anxiety medication to help with voice and increase anxiety episodes Discussed plans with patient for ongoing care management follow up and provided patient with direct contact information for care management team Encouraged to continue to attend therapy/counseling sessions to help with depression Discussed using deep breathing exercises when feeling anxious Encouraged to keep and attend scheduled medical appointments Congratulated patient on keeping log of vitals for easy provider review Emotional Support and empathy  provided to patient Encouraged to keep and attend scheduled MRI with sedation Diabetes    Yes progregrssing     Long Term Goal Advised patient, providing education and rationale, to check cbg at least twice a day and record, calling primary care provider or CCM Embedded Pharmacist for findings outside established parameters.   Congratulated patient on current CBG trends and encouraged to review what her goal A1C should be with provider Reviewed current glucose readings and Hgb A1C and congratulated on current readings and discussed ways to keep them within range HTN  Yes progregrssing     Long Term Goal Encouraged to continue to monitor blood pressures daily, and to notify provider for blood pressures elevations or lows CHF    Yes progregrssing     Long Term Goal Continue to monitor weights daily, first thing in the morning when she wakens after voiding and to notify provider for 3 pound weight gain overnight or 5 pounds within 1 week  Encouraged to monitor feet, legs, and abdomen for swelling Patient Goals/Self-Care Activities Over the next 120 days, patient will:  General Contact pulmonology if CPAP does not get deliveredContact pulmonology to let them know about medication denial Diabetes Check blood sugar at least twice a day (before meals or 2 hours post eating) Check blood sugar if I feel it is too high or too low Enter blood sugar readings and medication into daily log and take log to provider appointments Set target A1C; current A1C decreased to 6.3(07/22) Discuss with PCP goal Hgb A1C  HTN Continue to monitor blood pressures daily, noting if blood pressures elevated if prior to taking medications and to recheck after taking morning medications CHF Continue to monitor weights daily and keeping in log Weigh self first thing in the mornings after voiding Notify provider for increase weight 3 pounds overnight of 5 pounds within 5 days or lower extremity swelling Follow Up Plan: The  care management team will reach out to the patient again over the next 45 business days.        Plan:The care management team will reach out to the patient again over the next 45 days.  Hubert Azure RN, MSN RN Care Management Coordinator [place] [phone] [email or web address]

## 2020-11-30 ENCOUNTER — Other Ambulatory Visit: Payer: Self-pay | Admitting: *Deleted

## 2020-11-30 DIAGNOSIS — I428 Other cardiomyopathies: Secondary | ICD-10-CM

## 2020-11-30 MED ORDER — SPIRONOLACTONE 25 MG PO TABS: 25.0000 mg | ORAL_TABLET | Freq: Every day | ORAL | 1 refills | Status: DC

## 2020-12-04 ENCOUNTER — Other Ambulatory Visit: Payer: Self-pay | Admitting: Internal Medicine

## 2020-12-09 DIAGNOSIS — E042 Nontoxic multinodular goiter: Secondary | ICD-10-CM | POA: Diagnosis not present

## 2020-12-23 ENCOUNTER — Ambulatory Visit (INDEPENDENT_AMBULATORY_CARE_PROVIDER_SITE_OTHER): Payer: Medicare HMO | Admitting: *Deleted

## 2020-12-23 DIAGNOSIS — I10 Essential (primary) hypertension: Secondary | ICD-10-CM

## 2020-12-23 DIAGNOSIS — E119 Type 2 diabetes mellitus without complications: Secondary | ICD-10-CM

## 2020-12-23 DIAGNOSIS — I5020 Unspecified systolic (congestive) heart failure: Secondary | ICD-10-CM

## 2020-12-23 NOTE — Patient Instructions (Signed)
Visit Information  Thank you for taking time to visit with me today. Please don't hesitate to contact me if I can be of assistance to you before our next scheduled telephone appointment.  Following are the goals we discussed today:  (Copy and paste patient goals from clinical care plan here)  Our next appointment is by telephone on 01/20/21 at 1015  Please call the care guide team at [phone] if you need to cancel or reschedule your appointment.   If you are experiencing a Mental Health or [place] or need someone to talk to, please call the Suicide and Crisis Lifeline: 988 call the [place] National Suicide Prevention Lifeline: 339-759-2891 or TTY: 3644457351 TTY (228)396-6503) to talk to a trained counselor call 1-800-273-TALK (toll free, 24 hour hotline) call 911   Patient verbalizes understanding of instructions provided today and agrees to view in [place].   Hubert Azure RN, MSN RN Care Management Coordinator [place] [phone] [email or web address]

## 2020-12-23 NOTE — Chronic Care Management (AMB) (Signed)
Chronic Care Management   CCM RN Visit Note  12/23/2020 Name: Nancy Barajas MRN: [id number] DOB: 1960-12-27  Subjective: Nancy Barajas is a 60 y.o. year old female who is a primary care patient of McLean-Scocuzza, Nino Glow, MD. The care management team was consulted for assistance with disease management and care coordination needs.    Engaged with patient by telephone for follow up visit in response to provider referral for case management and/or care coordination services.   Consent to Services:  The patient was given information about Chronic Care Management services, agreed to services, and gave verbal consent prior to initiation of services.  Please see initial visit note for detailed documentation.   Patient agreed to services and verbal consent obtained.   Assessment: Review of patient past medical history, allergies, medications, health status, including review of consultants reports, laboratory and other test data, was performed as part of comprehensive evaluation and provision of chronic care management services.   SDOH (Social Determinants of Health) assessments and interventions performed:    CCM Care Plan  Allergies  Allergen Reactions   Penicillins Rash    Did it involve swelling of the face/tongue/throat, SOB, or low BP? No Did it involve sudden or severe rash/hives, skin peeling, or any reaction on the inside of your mouth or nose? No Did you need to seek medical attention at a hospital or doctor's office? No When did it last happen?      Childhood If all above answers are NO, may proceed with cephalosporin use.    Outpatient Encounter Medications as of 12/23/2020  Medication Sig Note   amLODipine (NORVASC) 5 MG tablet Take 1 tablet (5 mg total) by mouth daily.    baclofen (LIORESAL) 10 MG tablet Take 20 mg by mouth 2 (two) times daily.    blood glucose meter kit and supplies Dispense based on patient and insurance preference. Use twice daily as  directed. (FOR ICD-10 E10.9, E11.9).    carvedilol (COREG) 25 MG tablet Take 1 tablet (25 mg total) by mouth 2 (two) times daily with a meal.    Cholecalciferol (VITAMIN D-3) 125 MCG (5000 UT) TABS Take 1 tablet by mouth daily.    Dulaglutide (TRULICITY) 1.5 TK/1.6WF SOPN Inject 1.5 mg into the skin once a week.    FARXIGA 10 MG TABS tablet Take 1 tablet (10 mg total) by mouth daily.    Lancets MISC 1 Device by Does not apply route in the morning, at noon, and at bedtime. Dispense for onetouch ultra 2 machine ONLY    LORazepam (ATIVAN) 0.5 MG tablet Take 0.5-1 tablets (0.25-0.5 mg total) by mouth daily as needed for anxiety.    montelukast (SINGULAIR) 10 MG tablet Take 1 tablet by mouth once daily    Multiple Vitamins-Minerals (CENTRUM SILVER 50+WOMEN PO) Take 1 tablet by mouth daily.    omeprazole (PRILOSEC) 20 MG capsule Take 1 capsule (20 mg total) by mouth 2 (two) times daily before a meal.    ONETOUCH ULTRA test strip USE TO CHECK BLOOD SUGAR TWICE DAILY AS DIRECTED    rosuvastatin (CRESTOR) 10 MG tablet Take 1 tablet (10 mg total) by mouth every evening.    sacubitril-valsartan (ENTRESTO) 97-103 MG Take 1 tablet by mouth 2 (two) times daily.    sertraline (ZOLOFT) 100 MG tablet Take 1 tablet (100 mg total) by mouth daily. (Patient not taking: Reported on 11/23/2020) 11/23/2020: Reports no longer taking due feeling like the medication was not helping   sodium bicarbonate 650 MG  tablet Take 1 tablet (650 mg total) by mouth 2 (two) times daily.    sodium chloride (OCEAN) 0.65 % SOLN nasal spray Place 1 spray into both nostrils as needed for congestion.     spironolactone (ALDACTONE) 25 MG tablet Take 1 tablet (25 mg total) by mouth daily.    Facility-Administered Encounter Medications as of 12/23/2020  Medication   albuterol (PROVENTIL) (2.5 MG/3ML) 0.083% nebulizer solution 2.5 mg    Patient Active Problem List   Diagnosis Date Noted   Skin tag 07/02/2020   Seborrheic keratosis  07/02/2020   Anxiety and depression 02/19/2020   Obstructive uropathy 08/01/2019   Gallstones 03/50/0938   Umbilical hernia 18/29/9371   Hematuria 07/15/2019   Hypertension associated with diabetes ([place]) 07/11/2019   Vitamin D deficiency 07/11/2019   Hiatal hernia 07/11/2019   Obesity (BMI 30.0-34.9) 07/11/2019   Heart failure with reduced ejection fraction (HCC)    GAD (generalized anxiety disorder) 04/06/2019   Proteinuria 03/04/2019   Aortic atherosclerosis ([place]) 03/04/2019   Thyroid nodule 03/04/2019   Kidney cysts 03/04/2019   Hemangioma of liver 03/04/2019   Sinusitis 03/04/2019   Kidney stones    CKD stage 3 secondary to diabetes ([place]) 08/22/2018   Laryngeal spasm 05/07/2018   Muscle tension dysphonia 05/07/2018   Shortness of breath 01/22/2018   Uncontrolled hypertension 01/22/2018   Type 2 diabetes mellitus without complication, without long-term current use of insulin ([place]) 01/22/2018   Mixed hyperlipidemia 01/22/2018   Mild persistent asthma 01/22/2018    Conditions to be addressed/monitored:CHF, HTN, and DMII  Care Plan : Diabetes/Heart Failure/Hypertension  Updates made by [name], RN since 12/23/2020 12:00 AM     Problem: Management of Diabetes/CHF/HTN   Priority: Medium     Long-Range Goal: Management of Diabetes/CHF/HTN   Start Date: 12/11/2019  Expected End Date: 05/02/2021  Recent Progress: On track  Priority: Medium  Note:   Objective:  Lab Results  Component Value Date   HGBA1C 6.3 07/15/2020  Current Barriers:  Knowledge deficit related to self care management of chronic medical conditions;  monitor blood pressures daily (120/78 recent ranges 110-120/70-80's), and check blood sugars about twice a day (122, 80-110's).  Has been very good with keeping vital sign log. Denies any lower extremity edema and denies any increase in shortness of breath.  Continues to weigh self daily (did not review log).  Continues to have episodes with voice and  has been taking Ativan and Baclofen (dose increased) as needed with some relief. Has seen specialist at [facility].  Patient a little sad and frustrated with lack of diagnosis and treatment options, became very emotional and tearful discussing,  discussed looking at web site provided by specialist at [facility]. Social issues and knowledge deficits affecting self care management of chronic conditions: Diabetes, Hypertension, Heart Failure  Case Manager Clinical Goal(s):  Collaboration with McLean-Scocuzza, Nino Glow, MD regarding development and update of comprehensive plan of care as evidenced by provider attestation and co-signature Inter-disciplinary care team collaboration (see longitudinal plan of care) Over the next 120days, patient will demonstrate improved adherence to prescribed treatment plan for diabetes self care/management as evidenced by: compliance with taking Trulicity-states she has been taking this medication as prescribed Encourage lifestyle changes, such as increased intake of plant-based foods, stress reduction, consistent physical activity and medication compliance to prevent long-term complications and chronic disease.   Encourage daily monitoring and recording of CBG, blood pressure, and weights Discuss importance of adherence to ADA/ carb modified Heart Healthy diet  Discuss importance of adherence to prescribed medication regimen Case Manager Interventions:   General Reviewed medications with patient and discussed importance of medication adherence; discussed taking prescribed anxiety medication to help with voice and increase anxiety episodes Discussed plans with patient for ongoing care management follow up and provided patient with direct contact information for care management team Encouraged to continue to attend therapy/counseling sessions to help with depression Discussed using deep breathing exercises when feeling anxious Encouraged to keep and attend scheduled medical  appointments Congratulated patient on keeping log of vitals for easy provider review Emotional Support and empathy provided to patient Encouraged to review web site suggested  by [place] specialist for therapist Diabetes    Yes progregrssing     Long Term Goal Advised patient, providing education and rationale, to check cbg at least twice a day and record, calling primary care provider or CCM Embedded Pharmacist for findings outside established parameters.   Congratulated patient on current CBG trends and encouraged to review what her goal A1C should be with provider Reviewed current glucose readings and Hgb A1C and congratulated on current readings and discussed ways to keep them within range HTN  Yes progregrssing     Long Term Goal Encouraged to continue to monitor blood pressures daily, and to notify provider for blood pressures elevations or lows CHF    Yes progregrssing     Long Term Goal Continue to monitor weights daily, first thing in the morning when she wakens after voiding and to notify provider for 3 pound weight gain overnight or 5 pounds within 1 week  Encouraged to monitor feet, legs, and abdomen for swelling Patient Goals/Self-Care Activities Over the next 120 days, patient will:  General Contact pulmonology if CPAP does not get delivered Contact pulmonology to let them know about medication denial Diabetes Check blood sugar at least twice a day (before meals or 2 hours post eating) Check blood sugar if I feel it is too high or too low Enter blood sugar readings and medication into daily log and take log to provider appointments Set target A1C; current A1C decreased to 6.3(07/22) Discuss with PCP goal Hgb A1C  HTN Continue to monitor blood pressures daily, noting if blood pressures elevated if prior to taking medications and to recheck after taking morning medications CHF Continue to monitor weights daily and keeping in log Weigh self first thing in the mornings after  voiding Notify provider for increase weight 3 pounds overnight of 5 pounds within 5 days or lower extremity swelling Follow Up Plan: The care management team will reach out to the patient again over the next 30 business days.        Plan:The care management team will reach out to the patient again over the next 30 days.  Hubert Azure RN, MSN RN Care Management Coordinator [place] [phone] [email or web address]

## 2020-12-27 ENCOUNTER — Other Ambulatory Visit: Payer: Self-pay | Admitting: Internal Medicine

## 2021-01-02 DIAGNOSIS — E1159 Type 2 diabetes mellitus with other circulatory complications: Secondary | ICD-10-CM

## 2021-01-02 DIAGNOSIS — I5020 Unspecified systolic (congestive) heart failure: Secondary | ICD-10-CM

## 2021-01-02 DIAGNOSIS — I110 Hypertensive heart disease with heart failure: Secondary | ICD-10-CM

## 2021-01-02 DIAGNOSIS — Z7984 Long term (current) use of oral hypoglycemic drugs: Secondary | ICD-10-CM

## 2021-01-03 ENCOUNTER — Other Ambulatory Visit: Payer: Self-pay | Admitting: Internal Medicine

## 2021-01-03 DIAGNOSIS — F419 Anxiety disorder, unspecified: Secondary | ICD-10-CM

## 2021-01-03 DIAGNOSIS — R498 Other voice and resonance disorders: Secondary | ICD-10-CM

## 2021-01-04 ENCOUNTER — Ambulatory Visit: Payer: Medicare HMO | Admitting: Psychology

## 2021-01-05 ENCOUNTER — Other Ambulatory Visit: Payer: Self-pay | Admitting: Internal Medicine

## 2021-01-05 DIAGNOSIS — R498 Other voice and resonance disorders: Secondary | ICD-10-CM

## 2021-01-05 DIAGNOSIS — F419 Anxiety disorder, unspecified: Secondary | ICD-10-CM

## 2021-01-05 MED ORDER — LORAZEPAM 0.5 MG PO TABS: 0.2500 mg | ORAL_TABLET | Freq: Every day | ORAL | 5 refills | Status: DC | PRN

## 2021-01-05 NOTE — Telephone Encounter (Signed)
Refilled: 07/02/2020 Last OV: 07/02/2020 Next OV: not scheduled

## 2021-01-06 ENCOUNTER — Other Ambulatory Visit: Payer: Self-pay

## 2021-01-06 ENCOUNTER — Ambulatory Visit (INDEPENDENT_AMBULATORY_CARE_PROVIDER_SITE_OTHER): Payer: Medicare HMO | Admitting: Pharmacist

## 2021-01-06 DIAGNOSIS — I700 Atherosclerosis of aorta: Secondary | ICD-10-CM

## 2021-01-06 DIAGNOSIS — I152 Hypertension secondary to endocrine disorders: Secondary | ICD-10-CM

## 2021-01-06 DIAGNOSIS — J4530 Mild persistent asthma, uncomplicated: Secondary | ICD-10-CM

## 2021-01-06 DIAGNOSIS — I5020 Unspecified systolic (congestive) heart failure: Secondary | ICD-10-CM

## 2021-01-06 DIAGNOSIS — E119 Type 2 diabetes mellitus without complications: Secondary | ICD-10-CM

## 2021-01-06 MED ORDER — ROSUVASTATIN CALCIUM 20 MG PO TABS: 20.0000 mg | ORAL_TABLET | Freq: Every day | ORAL | 3 refills | Status: DC

## 2021-01-06 MED ORDER — AMLODIPINE BESYLATE 5 MG PO TABS: 5.0000 mg | ORAL_TABLET | Freq: Every day | ORAL | 1 refills | Status: DC

## 2021-01-06 NOTE — Patient Instructions (Addendum)
[name],   It was great talking to you today! Keep up the fantastic work!  We recommend the Shingrix (shingles) vaccine series for all over age 62. We also recommend the Tdap vaccine every 10 years. They both should have a $0 copay on all Medicare plans this year. You can pursue this without a prescription at your local pharmacy, or feel free to call our [place] at [facility] at [phone].  We also recommend that everyone get the updated COVID booster. You can also receive this at [place].   Schedule your yearly diabetic eye exam and have those results sent to our office once completed.   Take care!  Nancy Barajas, PharmD  Visit Information  Following are the goals we discussed today:  Patient Goals/Self-Care Activities Over the next 90 days, patient will:  - take medications as prescribed check glucose twice daily, document, and provide at future appointments check blood pressure daily, document, and provide at future appointments          Plan: Continue follow up with CCM RN as scheduled   Nancy Barajas, PharmD, [place], CPP Clinical Pharmacist [place] at [facility] [phone]   Please call the care guide team at [phone] if you need to cancel or reschedule your appointment.   The patient verbalized understanding of instructions, educational materials, and care plan provided today and agreed to receive a mailed copy of patient instructions, educational materials, and care plan.

## 2021-01-06 NOTE — Chronic Care Management (AMB) (Signed)
Chronic Care Management CCM Pharmacy Note  01/06/2021 Name:  Nancy Barajas MRN:  [id number] DOB:  Oct 30, 1960  Summary: - Tolerating regimen well.  - Requests financial support with CPAP. Referral placed for community resource care guides - She self discontinued sertraline and baclofen due to lack of benefit  Recommendations/Changes made from today's visit: - Discussed vaccinations. Scheduled flu shot visit, PCP visit, fasting labs.  - Increase rosuvastatin to 20 mg daily - Closing CCM pharmacy case due to goals of care achieved  Subjective: Nancy Barajas is an 61 y.o. year old female who is a primary patient of McLean-Scocuzza, Nino Glow, MD.  The CCM team was consulted for assistance with disease management and care coordination needs.    Engaged with patient by telephone for follow up visit for pharmacy case management and/or care coordination services.   Objective:  Medications Reviewed Today     Reviewed by De Hollingshead, RPH-CPP (Pharmacist) on 01/06/21 at 64  Med List Status: <None>   Medication Order Taking? Sig Documenting Provider Last Dose Status Informant  amLODipine (NORVASC) 5 MG tablet [id number] Yes Take 1 tablet (5 mg total) by mouth daily. McLean-Scocuzza, Nino Glow, MD Taking Active   blood glucose meter kit and supplies [id number] Yes Dispense based on patient and insurance preference. Use twice daily as directed. (FOR ICD-10 E10.9, E11.9). McLean-Scocuzza, Nino Glow, MD Taking Active   carvedilol (COREG) 25 MG tablet [id number] Yes Take 1 tablet (25 mg total) by mouth 2 (two) times daily with a meal. McLean-Scocuzza, Nino Glow, MD Taking Active   Cholecalciferol (VITAMIN D-3) 125 MCG (5000 UT) TABS [id number] Yes Take 1 tablet by mouth daily. Jodelle Green, FNP Taking Active   Dulaglutide (TRULICITY) 1.5 XP/9.7HZ SOPN [id number] Yes Inject 1.5 mg into the skin once a week. McLean-Scocuzza, Nino Glow, MD Taking Active   FARXIGA 10 MG TABS tablet [id number] Yes  Take 1 tablet (10 mg total) by mouth daily. Kate Sable, MD Taking Active   Lancets [place] [id number]  1 Device by Does not apply route in the morning, at noon, and at bedtime. Dispense for onetouch ultra 2 machine ONLY McLean-Scocuzza, Nino Glow, MD  Active   LORazepam (ATIVAN) 0.5 MG tablet [id number] Yes Take 0.5-1 tablets (0.25-0.5 mg total) by mouth daily as needed for anxiety. McLean-Scocuzza, Nino Glow, MD Taking Active   montelukast (SINGULAIR) 10 MG tablet [id number] Yes Take 1 tablet by mouth once daily Martyn Ehrich, NP Taking Active   Multiple Vitamins-Minerals (CENTRUM SILVER 50+WOMEN PO) [id number] Yes Take 1 tablet by mouth daily. [provider] Taking Active Self  Donald Siva test strip [id number] Yes USE TO CHECK BLOOD SUGAR TWICE DAILY AS DIRECTED McLean-Scocuzza, Nino Glow, MD Taking Active   rosuvastatin (CRESTOR) 10 MG tablet [id number] Yes Take 1 tablet by mouth in the evening McLean-Scocuzza, Nino Glow, MD Taking Active   sacubitril-valsartan (ENTRESTO) 97-103 MG [id number] Yes Take 1 tablet by mouth 2 (two) times daily. Kate Sable, MD Taking Active   sodium bicarbonate 650 MG tablet [id number] Yes Take 1 tablet (650 mg total) by mouth 2 (two) times daily. Billey Co, MD Taking Active   sodium chloride (OCEAN) 0.65 % SOLN nasal spray [id number] Yes Place 1 spray into both nostrils as needed for congestion.  [provider] Taking Active Self  spironolactone (ALDACTONE) 25 MG tablet [id number] Yes Take 1 tablet (25 mg total) by mouth daily. Kate Sable, MD Taking Active  Pertinent Labs:   Lab Results  Component Value Date   HGBA1C 6.3 07/15/2020   Lab Results  Component Value Date   CHOL 210 (H) 07/15/2020   HDL 94.50 07/15/2020   LDLCALC 98 07/15/2020   TRIG 86.0 07/15/2020   CHOLHDL 2 07/15/2020   Lab Results  Component Value Date   CREATININE 1.31 (H) 07/15/2020   BUN 35 (H) 07/15/2020   NA 140 07/15/2020   K  4.3 07/15/2020   CL 102 07/15/2020   CO2 31 07/15/2020    SDOH:  (Social Determinants of Health) assessments and interventions performed:  SDOH Interventions    Flowsheet Row Most Recent Value  SDOH Interventions   Financial Strain Interventions Intervention Not Indicated       CCM Care Plan  Review of patient past medical history, allergies, medications, health status, including review of consultants reports, laboratory and other test data, was performed as part of comprehensive evaluation and provision of chronic care management services.   Care Plan : Medication Management (Pharmacist)  Updates made by De Hollingshead, RPH-CPP since 01/06/2021 12:00 AM  Completed 01/06/2021   Problem: Diabetes, CKD, HF, HTN, Anxiety Resolved 01/06/2021     Long-Range Goal: Disease Progression Prevention Completed 01/06/2021  Start Date: 01/30/2020  Recent Progress: On track  Priority: High  Note:   Current Barriers:  Unable to independently afford treatment regimen Complex patient with multiple disease states at increased risk for disease progression   Pharmacist Clinical Goal(s):  Over the next 90 days, patient will maintain control of diabetes and hypertension as evidenced by maintenance of lab work through collaboration with PharmD and provider.  Interventions: 1:1 collaboration with McLean-Scocuzza, Nino Glow, MD regarding development and update of comprehensive plan of care as evidenced by provider attestation and co-signature Inter-disciplinary care team collaboration (see longitudinal plan of care) Comprehensive medication review performed; medication list updated in electronic medical record   Health Maintenance Yearly diabetic eye exam: up to date Yearly diabetic foot exam: due - placed reminder in upcoming appointment notes  Urine microalbumin: up to date Yearly influenza vaccination: due - scheduled appointment Td/Tdap vaccination: due - recommended to pursue Pneumonia  vaccination: due - declined  COVID vaccinations: due  - recommended to pursue Shingrix vaccinations: due - recommended to pursue Colonoscopy: due - previously declined Bone density scan: up to date Mammogram: up to date Due for PCP f/u. Scheduled  Diabetes: Controlled per last A1c, current treatment: Trulicity 1.5 mg, Farxiga 10 mg daily   Avoiding metformin d/t CKD  Current glucose readings: fasting/preprandial: 90-120; post prandial: 90-120s Praised for continued control of glucose.  Recmmend to continue current regimen at this time.    Heart Failure, most recent EF 40%, prior 25-30%: Appropriately managed; current treatment; follows with Dr. Garen Lah cardiology and nephrology  ARNI/ACEi/ARB: Delene Loll 97/103 mg BID Beta blocker: carvedilol 25 mg BID SGLT2: Farxiga 10 mg daily Mineralocorticoid Receptor Antagonist: spironolactone 25 mg daily Diuretic: none Additional antihypertensives: amlodipine 5 mg daily Current home vitals: 110-140/70-80, HR 70-80s Current home weights:  180-182 lbs Reuests refill on amlodipine Confirms understanding of importance of weighing daily. Confirms understanding to contact cardiology/primary care team if weight gain >3 lbs in 1 day or >5 lbs in 1 week Recommended to continue current regimen at this time.   Hyperlipidemia: Controlled at goal LDL <100, though more stringent goal of <70 may be appropriate; current treatment: rosuvastatin 10 mg daily    Hx Kidney Stones, Urinary Incontinence: Controlled per patient report; current regimen:  sodium bicarbonate 650 mg TID Previously recommended to continue current regimen at this time.    Depression/Anxiety: Moderately well controlled; current treatment: lorazepam 0.25-0.5 mg daily, taking QAM. Follows w/ psychiatry, has not established with a new psychiarist since Dr. Mamie Nick left and LCSW Alene Mires She self - discontinued sertraline due to lack of benefit and feeling "woozy". Reports she is not interested in  treatment at this tim Recommended to continue current regimen at this time along with collaboration with LCSW.  Vocal Cord Dysphonia: Uncontrolled, work up from neurology, pulmonary; current regimen: montelukast 10 mg daily Reports she self-discontinued baclofen due to lack of benefit. Never picked up omeprazole.  Notes that [facility] recommended looking for therapists that specialize in CBT. Will collaborate with LCSW support to determine options Previously recommened to continue collaboration with neurology, primary care, pulmonary.  Supplement: Vitamin D 1000 units, Multivitamin    Patient Goals/Self-Care Activities Over the next 90 days, patient will:  - take medications as prescribed check glucose twice daily, document, and provide at future appointments check blood pressure daily, document, and provide at future appointments       Plan: Continue follow up with CCM RN as scheduled  [name], PharmD, [place], [place] Pharmacist [employer] at [facility] [phone]

## 2021-01-07 ENCOUNTER — Telehealth: Payer: Self-pay

## 2021-01-07 NOTE — Telephone Encounter (Signed)
Lm for patient.  

## 2021-01-07 NOTE — Telephone Encounter (Signed)
Received letter from [facility] stating that patient wished to hold off on cpap at this time.   Spoke to patient and verified this information. She stated that she can not afford a machine right now.  She will call back when she is ready to proceed.   Routing to Dr. Patsey Berthold as an Juluis Rainier.

## 2021-01-07 NOTE — Telephone Encounter (Signed)
Created in error

## 2021-01-07 NOTE — Telephone Encounter (Signed)
This is very unfortunate as the patient does have severe obstructive sleep apnea and should be treated.

## 2021-01-08 ENCOUNTER — Ambulatory Visit (INDEPENDENT_AMBULATORY_CARE_PROVIDER_SITE_OTHER): Payer: Medicare HMO

## 2021-01-08 ENCOUNTER — Other Ambulatory Visit: Payer: Self-pay

## 2021-01-08 DIAGNOSIS — Z23 Encounter for immunization: Secondary | ICD-10-CM

## 2021-01-08 NOTE — Telephone Encounter (Signed)
Patient is aware of below message and voiced her understanding. She stated that she would get started on cpap machine once she has funds available.  Nothing further needed at this time.

## 2021-01-12 ENCOUNTER — Ambulatory Visit: Payer: Medicare HMO | Attending: Internal Medicine

## 2021-01-12 ENCOUNTER — Other Ambulatory Visit: Payer: Self-pay

## 2021-01-12 DIAGNOSIS — Z23 Encounter for immunization: Secondary | ICD-10-CM

## 2021-01-12 MED ORDER — PFIZER COVID-19 VAC BIVALENT 30 MCG/0.3ML IM SUSP
INTRAMUSCULAR | 1 refills | Status: DC
  Filled 2021-01-12: qty 0.3, 1d supply, fill #0

## 2021-01-12 NOTE — Progress Notes (Signed)
° °  Covid-19 Vaccination Clinic  Name:  Danyeal Akens    MRN: [id number] DOB: 01-Nov-1960  01/12/2021  Ms. Potvin was observed post Covid-19 immunization for 15 minutes without incident. She was provided with Vaccine Information Sheet and instruction to access the V-Safe system.   Ms. Butkiewicz was instructed to call 911 with any severe reactions post vaccine: Difficulty breathing  Swelling of face and throat  A fast heartbeat  A bad rash all over body  Dizziness and weakness   Immunizations Administered     Name Date Dose VIS Date Route   Pfizer Covid-19 Vaccine Bivalent Booster 01/12/2021  8:57 AM 0.3 mL 09/02/2020 Intramuscular   Manufacturer: [place]   Lot: [id number]   [place]: [place], PharmD, MBA Clinical Acute Care Pharmacist

## 2021-01-15 DIAGNOSIS — R69 Illness, unspecified: Secondary | ICD-10-CM | POA: Diagnosis not present

## 2021-01-15 DIAGNOSIS — J383 Other diseases of vocal cords: Secondary | ICD-10-CM | POA: Diagnosis not present

## 2021-01-15 DIAGNOSIS — E119 Type 2 diabetes mellitus without complications: Secondary | ICD-10-CM | POA: Diagnosis not present

## 2021-01-17 ENCOUNTER — Other Ambulatory Visit: Payer: Self-pay | Admitting: Primary Care

## 2021-01-18 ENCOUNTER — Ambulatory Visit: Payer: Medicare HMO

## 2021-01-19 ENCOUNTER — Telehealth: Payer: Self-pay

## 2021-01-19 NOTE — Telephone Encounter (Signed)
° °  Telephone encounter was:  Successful.  01/19/2021 Name: Nancy Barajas MRN: [id number] DOB: 21-Jun-1960  Nancy Barajas is a 61 y.o. year old female who is a primary care patient of McLean-Scocuzza, Nino Glow, MD . The community resource team was consulted for assistance with  CPAP  Care guide performed the following interventions: Spoke to [employer] at [employer] [facility] they have a CPAP machine and she is checking to see if she has the equipment to go with it.  Spoke to patient to let her know I found a CPAP machine and they are checking to see if they have the equipment to go with it and may be able to deliver it.  Nancy Barajas will call me back and update me.  Follow Up Plan:  Care guide will follow up with patient by phone over the next 5-72 days  Nancy Barajas, AAS Paralegal, [place] [facility]  [address], [place] [address] [email or web address]   ?? [phone]   [email or web address]

## 2021-01-19 NOTE — Chronic Care Management (AMB) (Signed)
°  Chronic Care Management   Note  01/19/2021 Name: [name] MRN: [id number] DOB: 1960-02-21  [name] is a 61 y.o. year old female who is a primary care patient of McLean-Scocuzza, Nino Glow, MD. Galina Haddox is currently enrolled in care management services. An additional referral for LCSW was placed.   Follow up plan: Telephone appointment with care management team member scheduled for:02/17/2021  Noreene Larsson, [place], [place], [place] [address] Direct Dial: [phone] [email or web address]

## 2021-01-20 ENCOUNTER — Ambulatory Visit: Payer: Medicare HMO | Admitting: *Deleted

## 2021-01-20 ENCOUNTER — Telehealth: Payer: Self-pay

## 2021-01-20 DIAGNOSIS — I152 Hypertension secondary to endocrine disorders: Secondary | ICD-10-CM

## 2021-01-20 DIAGNOSIS — E1159 Type 2 diabetes mellitus with other circulatory complications: Secondary | ICD-10-CM

## 2021-01-20 DIAGNOSIS — E119 Type 2 diabetes mellitus without complications: Secondary | ICD-10-CM

## 2021-01-20 NOTE — Chronic Care Management (AMB) (Signed)
Chronic Care Management   CCM RN Visit Note  01/20/2021 Name: Nancy Barajas MRN: [id number] DOB: [birth year]  Subjective: Nancy Barajas is a 61 y.o. year old female who is a primary care patient of McLean-Scocuzza, Nino Glow, MD. The care management team was consulted for assistance with disease management and care coordination needs.    Engaged with patient by telephone for follow up visit in response to provider referral for case management and/or care coordination services.   Consent to Services:  The patient was given information about Chronic Care Management services, agreed to services, and gave verbal consent prior to initiation of services.  Please see initial visit note for detailed documentation.   Patient agreed to services and verbal consent obtained.   Assessment: Review of patient past medical history, allergies, medications, health status, including review of consultants reports, laboratory and other test data, was performed as part of comprehensive evaluation and provision of chronic care management services.   SDOH (Social Determinants of Health) assessments and interventions performed:    CCM Care Plan  Allergies  Allergen Reactions   Penicillins Rash    Did it involve swelling of the face/tongue/throat, SOB, or low BP? No Did it involve sudden or severe rash/hives, skin peeling, or any reaction on the inside of your mouth or nose? No Did you need to seek medical attention at a hospital or doctor's office? No When did it last happen?      Childhood If all above answers are NO, may proceed with cephalosporin use.    Outpatient Encounter Medications as of 01/20/2021  Medication Sig   amLODipine (NORVASC) 5 MG tablet Take 1 tablet (5 mg total) by mouth daily.   blood glucose meter kit and supplies Dispense based on patient and insurance preference. Use twice daily as directed. (FOR ICD-10 E10.9, E11.9).   carvedilol (COREG) 25 MG tablet Take 1 tablet (25 mg  total) by mouth 2 (two) times daily with a meal.   Cholecalciferol (VITAMIN D-3) 125 MCG (5000 UT) TABS Take 1 tablet by mouth daily.   COVID-19 mRNA bivalent vaccine, Pfizer, (PFIZER COVID-19 VAC BIVALENT) injection Inject into the muscle.   Dulaglutide (TRULICITY) 1.5 NI/7.7OE SOPN Inject 1.5 mg into the skin once a week.   FARXIGA 10 MG TABS tablet Take 1 tablet (10 mg total) by mouth daily.   Lancets MISC 1 Device by Does not apply route in the morning, at noon, and at bedtime. Dispense for onetouch ultra 2 machine ONLY   LORazepam (ATIVAN) 0.5 MG tablet Take 0.5-1 tablets (0.25-0.5 mg total) by mouth daily as needed for anxiety.   montelukast (SINGULAIR) 10 MG tablet Take 1 tablet by mouth once daily   Multiple Vitamins-Minerals (CENTRUM SILVER 50+WOMEN PO) Take 1 tablet by mouth daily.   ONETOUCH ULTRA test strip USE TO CHECK BLOOD SUGAR TWICE DAILY AS DIRECTED   rosuvastatin (CRESTOR) 20 MG tablet Take 1 tablet (20 mg total) by mouth daily.   sacubitril-valsartan (ENTRESTO) 97-103 MG Take 1 tablet by mouth 2 (two) times daily.   sodium bicarbonate 650 MG tablet Take 1 tablet (650 mg total) by mouth 2 (two) times daily.   sodium chloride (OCEAN) 0.65 % SOLN nasal spray Place 1 spray into both nostrils as needed for congestion.    spironolactone (ALDACTONE) 25 MG tablet Take 1 tablet (25 mg total) by mouth daily.   Facility-Administered Encounter Medications as of 01/20/2021  Medication   albuterol (PROVENTIL) (2.5 MG/3ML) 0.083% nebulizer solution 2.5 mg  Patient Active Problem List   Diagnosis Date Noted   Skin tag 07/02/2020   Seborrheic keratosis 07/02/2020   Anxiety and depression 02/19/2020   Obstructive uropathy 08/01/2019   Gallstones 38/18/2993   Umbilical hernia 71/69/6789   Hematuria 07/15/2019   Hypertension associated with diabetes ([place]) 07/11/2019   Vitamin D deficiency 07/11/2019   Hiatal hernia 07/11/2019   Obesity (BMI 30.0-34.9) 07/11/2019   Heart failure  with reduced ejection fraction (HCC)    GAD (generalized anxiety disorder) 04/06/2019   Proteinuria 03/04/2019   Aortic atherosclerosis ([place]) 03/04/2019   Thyroid nodule 03/04/2019   Kidney cysts 03/04/2019   Hemangioma of liver 03/04/2019   Sinusitis 03/04/2019   Kidney stones    CKD stage 3 secondary to diabetes ([place]) 08/22/2018   Laryngeal spasm 05/07/2018   Muscle tension dysphonia 05/07/2018   Shortness of breath 01/22/2018   Uncontrolled hypertension 01/22/2018   Type 2 diabetes mellitus without complication, without long-term current use of insulin ([place]) 01/22/2018   Mixed hyperlipidemia 01/22/2018   Mild persistent asthma 01/22/2018    Conditions to be addressed/monitored:CHF, HTN, and DMII  Care Plan : Diabetes/Heart Failure/Hypertension  Updates made by [name], RN since 01/20/2021 12:00 AM     Problem: Management of Diabetes/CHF/HTN   Priority: Medium     Long-Range Goal: Management of Diabetes/CHF/HTN   Start Date: 12/11/2019  Expected End Date: 05/02/2021  Recent Progress: On track  Priority: Medium  Note:   Objective:  Lab Results  Component Value Date   HGBA1C 6.3 07/15/2020  Current Barriers:  Knowledge deficit related to self care management of chronic medical conditions;  monitor blood pressures daily (122/91 recent ranges 110/70-90's), and check blood sugars about twice a day (112, 80-110's).  Has been very good with keeping vital sign log. Denies any lower extremity edema and denies any increase in shortness of breath.  Continues to weigh self daily (did not review log).  Continues to have episodes with voice and has been taking Ativan and Baclofen (dose increased) as needed with some relief. Has seen specialist at [facility].  Patient a little sad and overwhelmed with information of treatment options, discussed looking at web site provided by specialist at [facility]. Social issues and knowledge deficits affecting self care management of chronic conditions:  Diabetes, Hypertension, Heart Failure  Case Manager Clinical Goal(s):  Collaboration with McLean-Scocuzza, Nino Glow, MD regarding development and update of comprehensive plan of care as evidenced by provider attestation and co-signature Inter-disciplinary care team collaboration (see longitudinal plan of care) Over the next 120days, patient will demonstrate improved adherence to prescribed treatment plan for diabetes self care/management as evidenced by: compliance with taking Trulicity-states she has been taking this medication as prescribed Encourage lifestyle changes, such as increased intake of plant-based foods, stress reduction, consistent physical activity and medication compliance to prevent long-term complications and chronic disease.   Encourage daily monitoring and recording of CBG, blood pressure, and weights Discuss importance of adherence to ADA/ carb modified Heart Healthy diet Discuss importance of adherence to prescribed medication regimen Case Manager Interventions:   General Reviewed medications with patient and discussed importance of medication adherence; discussed taking prescribed anxiety medication to help with voice and increase anxiety episodes Discussed plans with patient for ongoing care management follow up and provided patient with direct contact information for care management team Encouraged to continue to attend therapy/counseling sessions to help with depression Discussed using deep breathing exercises when feeling anxious Encouraged to keep and attend scheduled medical appointments Congratulated  patient on keeping log of vitals for easy provider review Emotional Support and empathy provided to patient Encouraged to review web site suggested  by [facility] specialist for therapist A local counselor is Nancy Setters, Nancy Barajas, [facility]. You can use the information below to contact her Nancy Barajas and [place], [place] [address] [phone] Diabetes    Yes  progregrssing     Long Term Goal Advised patient, providing education and rationale, to check cbg at least twice a day and record, calling primary care provider or CCM Embedded Pharmacist for findings outside established parameters.   Congratulated patient on current CBG trends and encouraged to review what her goal A1C should be with provider Reviewed current glucose readings and Hgb A1C and congratulated on current readings and discussed ways to keep them within range HTN  Yes progregrssing     Long Term Goal Encouraged to continue to monitor blood pressures daily, and to notify provider for blood pressures elevations or lows CHF    Yes progregrssing     Long Term Goal Continue to monitor weights daily, first thing in the morning when she wakens after voiding and to notify provider for 3 pound weight gain overnight or 5 pounds within 1 week  Encouraged to monitor feet, legs, and abdomen for swelling Patient Goals/Self-Care Activities Over the next 120 days, patient will:  General Contact pulmonology if CPAP does not get delivered Discussed speaking with family /brother to assist with transportation to get CPAP Please contact A local counselor is Nancy Setters, Nancy Barajas, [facility]. You can use the information below to contact her Nancy Barajas and [place], [place] [address]   [phone] Diabetes Check blood sugar at least twice a day (before meals or 2 hours post eating) Check blood sugar if I feel it is too high or too low Enter blood sugar readings and medication into daily log and take log to provider appointments Set target A1C; current A1C decreased to 6.3(07/22) Discuss with PCP goal Hgb A1C  HTN Continue to monitor blood pressures daily, noting if blood pressures elevated if prior to taking medications and to recheck after taking morning medications CHF Continue to monitor weights daily and keeping in log Weigh self first thing in the mornings after voiding Notify provider for  increase weight 3 pounds overnight of 5 pounds within 5 days or lower extremity swelling Follow Up Plan: The care management team will reach out to the patient again over the next 30 business days.        Plan:The care management team will reach out to the patient again over the next 30 days.  Hubert Azure RN, MSN RN Care Management Coordinator [place] [phone] [email or web address]

## 2021-01-20 NOTE — Patient Instructions (Addendum)
Visit Information  Thank you for taking time to visit with me today. Please don't hesitate to contact me if I can be of assistance to you before our next scheduled telephone appointment.  Following are the goals we discussed today:  General Contact pulmonology if CPAP does not get delivered Discussed speaking with family /brother to assist with transportation to get CPAP Please contact A local counselor is Barbera Setters, PhD, [facility]. You can use the information below to contact her Joana Reamer and [place], [place] [phone]   [phone] Diabetes Check blood sugar at least twice a day (before meals or 2 hours post eating) Check blood sugar if I feel it is too high or too low Enter blood sugar readings and medication into daily log and take log to provider appointments Set target A1C; current A1C decreased to 6.3(07/22) Discuss with PCP goal Hgb A1C  HTN Continue to monitor blood pressures daily, noting if blood pressures elevated if prior to taking medications and to recheck after taking morning medications CHF Continue to monitor weights daily and keeping in log Weigh self first thing in the mornings after voiding Notify provider for increase weight 3 pounds overnight of 5 pounds within 5 days or lower extremity swelling  Our next appointment is by telephone on 2/22 at 1000  Please call the care guide team at [phone] if you need to cancel or reschedule your appointment.   If you are experiencing a Mental Health or [place] or need someone to talk to, please call the Suicide and Crisis Lifeline: 988 call the [place] National Suicide Prevention Lifeline: (936) 440-1974 or TTY: (478)337-8542 TTY 905-317-2143) to talk to a trained counselor call 1-800-273-TALK (toll free, 24 hour hotline) call 911   Patient verbalizes understanding of instructions and care plan provided today and agrees to view in [place]. Active MyChart status confirmed with patient.     Hubert Azure RN, MSN RN Care Management Coordinator [place] [phone] Hawken [email or web address]

## 2021-01-20 NOTE — Telephone Encounter (Signed)
° °  Telephone encounter was:  Successful.  01/20/2021 Name: Nancy Barajas MRN: [id number] DOB: February 26, 1960  Nancy Barajas is a 61 y.o. year old female who is a primary care patient of McLean-Scocuzza, Nino Glow, MD . The community resource team was consulted for assistance with  CPAP machine.  Care guide performed the following interventions: Spoke with patient to inform her Nancy Barajas at [employer] [facility] would not be able to deliver the CPAP machine to her.   Patient stated that she would be able to have a friend drive her to pick up the CPAP. She has Nancy Barajas's name and number and will call to arrange a pick up time. Also emailed information for [employer] [facility] [facility] and the American Sleep Apnea Association/CPAP Assistance Program.    Follow Up Plan:  No further follow up planned at this time. The patient has been provided with needed resources.  Nancy Barajas, Nancy Barajas Paralegal, [facility]  [address], [place] [address] ??[email or web address]   ?? [phone]   [email or web address]

## 2021-01-21 ENCOUNTER — Ambulatory Visit: Payer: Medicare HMO | Admitting: Psychology

## 2021-01-28 ENCOUNTER — Other Ambulatory Visit: Payer: Self-pay | Admitting: Internal Medicine

## 2021-01-28 DIAGNOSIS — E119 Type 2 diabetes mellitus without complications: Secondary | ICD-10-CM

## 2021-01-29 NOTE — Telephone Encounter (Signed)
Refill was already sent today. Refill request denied.

## 2021-02-02 DIAGNOSIS — J4530 Mild persistent asthma, uncomplicated: Secondary | ICD-10-CM

## 2021-02-02 DIAGNOSIS — I5020 Unspecified systolic (congestive) heart failure: Secondary | ICD-10-CM

## 2021-02-02 DIAGNOSIS — Z7984 Long term (current) use of oral hypoglycemic drugs: Secondary | ICD-10-CM | POA: Diagnosis not present

## 2021-02-02 DIAGNOSIS — I110 Hypertensive heart disease with heart failure: Secondary | ICD-10-CM | POA: Diagnosis not present

## 2021-02-02 DIAGNOSIS — E1159 Type 2 diabetes mellitus with other circulatory complications: Secondary | ICD-10-CM | POA: Diagnosis not present

## 2021-02-05 ENCOUNTER — Telehealth: Payer: Self-pay | Admitting: Internal Medicine

## 2021-02-05 NOTE — Telephone Encounter (Signed)
Pt has a lab appt 02/08/2021, there are no orders in.

## 2021-02-05 NOTE — Addendum Note (Signed)
Addended by: Orland Mustard on: 02/05/2021 04:31 PM   Modules accepted: Orders

## 2021-02-08 ENCOUNTER — Other Ambulatory Visit (INDEPENDENT_AMBULATORY_CARE_PROVIDER_SITE_OTHER): Payer: Medicare HMO

## 2021-02-08 ENCOUNTER — Other Ambulatory Visit: Payer: Self-pay

## 2021-02-08 DIAGNOSIS — I10 Essential (primary) hypertension: Secondary | ICD-10-CM | POA: Diagnosis not present

## 2021-02-08 DIAGNOSIS — I152 Hypertension secondary to endocrine disorders: Secondary | ICD-10-CM

## 2021-02-08 DIAGNOSIS — E1159 Type 2 diabetes mellitus with other circulatory complications: Secondary | ICD-10-CM | POA: Diagnosis not present

## 2021-02-08 DIAGNOSIS — Z1329 Encounter for screening for other suspected endocrine disorder: Secondary | ICD-10-CM | POA: Diagnosis not present

## 2021-02-08 LAB — COMPREHENSIVE METABOLIC PANEL
ALT: 16 U/L (ref 0–35)
AST: 15 U/L (ref 0–37)
Albumin: 4.5 g/dL (ref 3.5–5.2)
Alkaline Phosphatase: 55 U/L (ref 39–117)
BUN: 30 mg/dL — ABNORMAL HIGH (ref 6–23)
CO2: 32 mEq/L (ref 19–32)
Calcium: 9.8 mg/dL (ref 8.4–10.5)
Chloride: 103 mEq/L (ref 96–112)
Creatinine, Ser: 1.09 mg/dL (ref 0.40–1.20)
GFR: 54.92 mL/min — ABNORMAL LOW (ref >60.00)
Glucose, Bld: 129 mg/dL — ABNORMAL HIGH (ref 70–99)
Potassium: 4.1 mEq/L (ref 3.5–5.1)
Sodium: 143 mEq/L (ref 135–145)
Total Bilirubin: 1.1 mg/dL (ref 0.2–1.2)
Total Protein: 7.5 g/dL (ref 6.0–8.3)

## 2021-02-08 LAB — CBC WITH DIFFERENTIAL/PLATELET
Basophils Absolute: 0.1 10*3/uL (ref 0.0–0.1)
Basophils Relative: 1.2 % (ref 0.0–3.0)
Eosinophils Absolute: 0 10*3/uL (ref 0.0–0.7)
Eosinophils Relative: 0.7 % (ref 0.0–5.0)
HCT: 41.9 % (ref 36.0–46.0)
Hemoglobin: 13.1 g/dL (ref 12.0–15.0)
Lymphocytes Relative: 29.3 % (ref 12.0–46.0)
Lymphs Abs: 1.5 10*3/uL (ref 0.7–4.0)
MCHC: 31.3 g/dL (ref 30.0–36.0)
MCV: 74.5 fl — ABNORMAL LOW (ref 78.0–100.0)
Monocytes Absolute: 0.3 10*3/uL (ref 0.1–1.0)
Monocytes Relative: 5.9 % (ref 3.0–12.0)
Neutro Abs: 3.2 10*3/uL (ref 1.4–7.7)
Neutrophils Relative %: 62.9 % (ref 43.0–77.0)
Platelets: 272 10*3/uL (ref 150.0–400.0)
RBC: 5.63 Mil/uL — ABNORMAL HIGH (ref 3.87–5.11)
RDW: 15.7 % — ABNORMAL HIGH (ref 11.5–15.5)
WBC: 5.1 10*3/uL (ref 4.0–10.5)

## 2021-02-08 LAB — HEMOGLOBIN A1C: Hgb A1c MFr Bld: 6.5 % (ref 4.6–6.5)

## 2021-02-08 LAB — LIPID PANEL
Cholesterol: 201 mg/dL — ABNORMAL HIGH (ref 0–200)
HDL: 104.1 mg/dL (ref >39.00)
LDL Cholesterol: 83 mg/dL (ref 0–99)
NonHDL: 97.08
Total CHOL/HDL Ratio: 2
Triglycerides: 70 mg/dL (ref 0.0–149.0)
VLDL: 14 mg/dL (ref 0.0–40.0)

## 2021-02-08 LAB — TSH: TSH: 0.7 u[IU]/mL (ref 0.35–5.50)

## 2021-02-11 ENCOUNTER — Ambulatory Visit: Payer: Medicare HMO | Admitting: Psychology

## 2021-02-11 ENCOUNTER — Ambulatory Visit (INDEPENDENT_AMBULATORY_CARE_PROVIDER_SITE_OTHER): Payer: Medicare HMO

## 2021-02-11 ENCOUNTER — Ambulatory Visit (INDEPENDENT_AMBULATORY_CARE_PROVIDER_SITE_OTHER): Payer: Medicare HMO | Admitting: Internal Medicine

## 2021-02-11 ENCOUNTER — Other Ambulatory Visit: Payer: Self-pay

## 2021-02-11 ENCOUNTER — Encounter: Payer: Self-pay | Admitting: Internal Medicine

## 2021-02-11 VITALS — BP 128/78 | HR 85 | Temp 98.5°F | Ht 70.0 in | Wt 181.6 lb

## 2021-02-11 DIAGNOSIS — Z23 Encounter for immunization: Secondary | ICD-10-CM

## 2021-02-11 DIAGNOSIS — Z1231 Encounter for screening mammogram for malignant neoplasm of breast: Secondary | ICD-10-CM | POA: Diagnosis not present

## 2021-02-11 DIAGNOSIS — G8929 Other chronic pain: Secondary | ICD-10-CM

## 2021-02-11 DIAGNOSIS — M25511 Pain in right shoulder: Secondary | ICD-10-CM

## 2021-02-11 DIAGNOSIS — E1159 Type 2 diabetes mellitus with other circulatory complications: Secondary | ICD-10-CM

## 2021-02-11 DIAGNOSIS — I428 Other cardiomyopathies: Secondary | ICD-10-CM

## 2021-02-11 DIAGNOSIS — E119 Type 2 diabetes mellitus without complications: Secondary | ICD-10-CM

## 2021-02-11 DIAGNOSIS — Z1211 Encounter for screening for malignant neoplasm of colon: Secondary | ICD-10-CM | POA: Diagnosis not present

## 2021-02-11 DIAGNOSIS — I152 Hypertension secondary to endocrine disorders: Secondary | ICD-10-CM | POA: Diagnosis not present

## 2021-02-11 DIAGNOSIS — I10 Essential (primary) hypertension: Secondary | ICD-10-CM | POA: Diagnosis not present

## 2021-02-11 MED ORDER — CARVEDILOL 25 MG PO TABS: 25.0000 mg | ORAL_TABLET | Freq: Two times a day (BID) | ORAL | 3 refills | Status: DC

## 2021-02-11 MED ORDER — TRULICITY 1.5 MG/0.5ML ~~LOC~~ SOAJ: 1.5000 mg | SUBCUTANEOUS | 3 refills | Status: DC

## 2021-02-11 MED ORDER — AMLODIPINE BESYLATE 5 MG PO TABS: 5.0000 mg | ORAL_TABLET | Freq: Every day | ORAL | 3 refills | Status: DC

## 2021-02-11 MED ORDER — ONETOUCH ULTRA VI STRP: ORAL_STRIP | 3 refills | Status: DC

## 2021-02-11 MED ORDER — SPIRONOLACTONE 25 MG PO TABS: 25.0000 mg | ORAL_TABLET | Freq: Every day | ORAL | 3 refills | Status: DC

## 2021-02-11 MED ORDER — ONETOUCH ULTRASOFT LANCETS MISC: 3 refills | Status: DC

## 2021-02-11 NOTE — Progress Notes (Signed)
Chief Complaint  Patient presents with   Follow-up   Medicare Wellness   6 month f/u  1. Htn controlled on norvasc 5 mg qd coreg 25 mg bid entreso 97-103 spironolactone 25 mg qd  2. Dm 2 A1c controlled 6.5 trulicity 1.5 weekly refills and stips and lancets also on farxiga 10 mg qd  3. Feels like lexapro 5 mg supposed to increase to 10 mg is causing mouth to move disc with psychiatry call and sch appt with therapy still havinv anxiety on ativan 0.5 1/2 to 1 qd prn  4. Right shoulder pain and limited rom reaching overhead x months 7/10    Review of Systems  Constitutional:  Negative for weight loss.  HENT:  Negative for hearing loss.   Eyes:  Negative for blurred vision.  Respiratory:  Negative for shortness of breath.   Cardiovascular:  Negative for chest pain.  Gastrointestinal:  Negative for abdominal pain and blood in stool.  Genitourinary:  Negative for dysuria.  Musculoskeletal:  Positive for joint pain. Negative for falls.  Skin:  Negative for rash.  Neurological:  Negative for headaches.  Psychiatric/Behavioral:  Negative for depression.   Past Medical History:  Diagnosis Date   Anxiety    Asthma    Depression    Diabetes mellitus without complication ([place])    History of kidney stones    Hypertension    Kidney stones    Vertigo    when laying flat   Vocal cord dysfunction    makes it hard for patient to talk and breathe   Past Surgical History:  Procedure Laterality Date   CARDIAC CATHETERIZATION     CYSTOSCOPY W/ RETROGRADES Right 09/27/2019   Procedure: CYSTOSCOPY WITH RETROGRADE PYELOGRAM;  Surgeon: Billey Co, MD;  Location: [facility];  Service: Urology;  Laterality: Right;   CYSTOSCOPY/URETEROSCOPY/HOLMIUM LASER/STENT PLACEMENT Left 09/27/2019   Procedure: CYSTOSCOPY/URETEROSCOPY/HOLMIUM LASER/STENT PLACEMENT;  Surgeon: Billey Co, MD;  Location: [facility];  Service: Urology;  Laterality: Left;   LITHOTRIPSY     RIGHT/LEFT HEART CATH AND CORONARY  ANGIOGRAPHY Bilateral 04/08/2019   Procedure: RIGHT/LEFT HEART CATH AND CORONARY ANGIOGRAPHY;  Surgeon: Wellington Hampshire, MD;  Location: [facility];  Service: Cardiovascular;  Laterality: Bilateral;   Family History  Problem Relation Age of Onset   Hypertension Mother    Diabetes Mother    Hypertension Father    Diabetes Father    Breast cancer Neg Hx    Social History   Socioeconomic History   Marital status: Divorced    Spouse name: Not on file   Number of children: 2   Years of education: Not on file   Highest education level: 9th grade  Occupational History   Not on file  Tobacco Use   Smoking status: Former    Packs/day: 1.00    Years: 10.00    Pack years: 10.00    Types: Cigarettes    Quit date: 01/03/1998    Years since quitting: 23.1   Smokeless tobacco: Never  Vaping Use   Vaping Use: Never used  Substance and Sexual Activity   Alcohol use: Not Currently   Drug use: Not on file    Comment: years ago   Sexual activity: Not Currently  Other Topics Concern   Not on file  Social History Narrative   Lives at home    2 daughters as of 04/04/19 51 y.o daughter lives with her (she works)   Patient is on disability.  Social Determinants of Health   Financial Resource Strain: Low Risk    Difficulty of Paying Living Expenses: Not hard at all  Food Insecurity: Not on file  Transportation Needs: Not on file  Physical Activity: Not on file  Stress: Stress Concern Present   Feeling of Stress : To some extent  Social Connections: Not on file  Intimate Partner Violence: Not on file   Current Meds  Medication Sig   blood glucose meter kit and supplies Dispense based on patient and insurance preference. Use twice daily as directed. (FOR ICD-10 E10.9, E11.9).   Cholecalciferol (VITAMIN D-3) 125 MCG (5000 UT) TABS Take 1 tablet by mouth daily.   COVID-19 mRNA bivalent vaccine, Pfizer, (PFIZER COVID-19 VAC BIVALENT) injection Inject into the muscle.    escitalopram (LEXAPRO) 5 MG tablet Take 5 mg by mouth daily.   FARXIGA 10 MG TABS tablet Take 1 tablet (10 mg total) by mouth daily.   glucose blood (ONETOUCH ULTRA) test strip USE TO CHECK BLOOD SUGAR TWICE DAILY AS DIRECTED   Lancets (ONETOUCH ULTRASOFT) lancets Use as instructed bid   Lancets MISC 1 Device by Does not apply route in the morning, at noon, and at bedtime. Dispense for onetouch ultra 2 machine ONLY   LORazepam (ATIVAN) 0.5 MG tablet Take 0.5-1 tablets (0.25-0.5 mg total) by mouth daily as needed for anxiety.   montelukast (SINGULAIR) 10 MG tablet Take 1 tablet by mouth once daily   Multiple Vitamins-Minerals (CENTRUM SILVER 50+WOMEN PO) Take 1 tablet by mouth daily.   rosuvastatin (CRESTOR) 20 MG tablet Take 1 tablet (20 mg total) by mouth daily.   sacubitril-valsartan (ENTRESTO) 97-103 MG Take 1 tablet by mouth 2 (two) times daily.   sodium bicarbonate 650 MG tablet Take 1 tablet (650 mg total) by mouth 2 (two) times daily.   sodium chloride (OCEAN) 0.65 % SOLN nasal spray Place 1 spray into both nostrils as needed for congestion.    [DISCONTINUED] amLODipine (NORVASC) 5 MG tablet Take 1 tablet (5 mg total) by mouth daily.   [DISCONTINUED] carvedilol (COREG) 25 MG tablet Take 1 tablet (25 mg total) by mouth 2 (two) times daily with a meal.   [DISCONTINUED] Dulaglutide (TRULICITY) 1.5 JT/7.0VX SOPN Inject 1.5 mg into the skin once a week.   [DISCONTINUED] ONETOUCH ULTRA test strip USE TO CHECK BLOOD SUGAR TWICE DAILY AS DIRECTED   [DISCONTINUED] spironolactone (ALDACTONE) 25 MG tablet Take 1 tablet (25 mg total) by mouth daily.   Allergies  Allergen Reactions   Penicillins Rash    Did it involve swelling of the face/tongue/throat, SOB, or low BP? No Did it involve sudden or severe rash/hives, skin peeling, or any reaction on the inside of your mouth or nose? No Did you need to seek medical attention at a hospital or doctor's office? No When did it last happen?       Childhood If all above answers are NO, may proceed with cephalosporin use.   Recent Results (from the past 2160 hour(s))  Hemoglobin A1c     Status: None   Collection Time: 02/08/21  8:48 AM  Result Value Ref Range   Hgb A1c MFr Bld 6.5 4.6 - 6.5 %    Comment: Glycemic Control Guidelines for People with Diabetes:Non Diabetic:  <6%Goal of Therapy: <7%Additional Action Suggested:  >8%   TSH     Status: None   Collection Time: 02/08/21  8:48 AM  Result Value Ref Range   TSH 0.70 0.35 - 5.50 uIU/mL  CBC with Differential/Platelet     Status: Abnormal   Collection Time: 02/08/21  8:48 AM  Result Value Ref Range   WBC 5.1 4.0 - 10.5 K/uL   RBC 5.63 (H) 3.87 - 5.11 Mil/uL   Hemoglobin 13.1 12.0 - 15.0 g/dL   HCT 41.9 36.0 - 46.0 %   MCV 74.5 (L) 78.0 - 100.0 fl   MCHC 31.3 30.0 - 36.0 g/dL   RDW 15.7 (H) 11.5 - 15.5 %   Platelets 272.0 150.0 - 400.0 K/uL   Neutrophils Relative % 62.9 43.0 - 77.0 %   Lymphocytes Relative 29.3 12.0 - 46.0 %   Monocytes Relative 5.9 3.0 - 12.0 %   Eosinophils Relative 0.7 0.0 - 5.0 %   Basophils Relative 1.2 0.0 - 3.0 %   Neutro Abs 3.2 1.4 - 7.7 K/uL   Lymphs Abs 1.5 0.7 - 4.0 K/uL   Monocytes Absolute 0.3 0.1 - 1.0 K/uL   Eosinophils Absolute 0.0 0.0 - 0.7 K/uL   Basophils Absolute 0.1 0.0 - 0.1 K/uL  Lipid panel     Status: Abnormal   Collection Time: 02/08/21  8:48 AM  Result Value Ref Range   Cholesterol 201 (H) 0 - 200 mg/dL    Comment: ATP III Classification       Desirable:  < 200 mg/dL               Borderline High:  200 - 239 mg/dL          High:  > = 240 mg/dL   Triglycerides 70.0 0.0 - 149.0 mg/dL    Comment: Normal:  <150 mg/dLBorderline High:  150 - 199 mg/dL   HDL 104.10 >39.00 mg/dL   VLDL 14.0 0.0 - 40.0 mg/dL   LDL Cholesterol 83 0 - 99 mg/dL   Total CHOL/HDL Ratio 2     Comment:                Men          Women1/2 Average Risk     3.4          3.3Average Risk          5.0          4.42X Average Risk          9.6          7.13X  Average Risk          15.0          11.0                       NonHDL 97.08     Comment: NOTE:  Non-HDL goal should be 30 mg/dL higher than patient's LDL goal (i.e. LDL goal of < 70 mg/dL, would have non-HDL goal of < 100 mg/dL)  Comprehensive metabolic panel     Status: Abnormal   Collection Time: 02/08/21  8:48 AM  Result Value Ref Range   Sodium 143 135 - 145 mEq/L   Potassium 4.1 3.5 - 5.1 mEq/L   Chloride 103 96 - 112 mEq/L   CO2 32 19 - 32 mEq/L   Glucose, Bld 129 (H) 70 - 99 mg/dL   BUN 30 (H) 6 - 23 mg/dL   Creatinine, Ser 1.09 0.40 - 1.20 mg/dL   Total Bilirubin 1.1 0.2 - 1.2 mg/dL   Alkaline Phosphatase 55 39 - 117 U/L   AST 15 0 - 37 U/L   ALT 16  0 - 35 U/L   Total Protein 7.5 6.0 - 8.3 g/dL   Albumin 4.5 3.5 - 5.2 g/dL   GFR 54.92 (L) >60.00 mL/min    Comment: Calculated using the CKD-EPI Creatinine Equation (2021)   Calcium 9.8 8.4 - 10.5 mg/dL   Objective  Body mass index is 26.06 kg/m. Wt Readings from Last 3 Encounters:  02/11/21 181 lb 9.6 oz (82.4 kg)  11/06/20 178 lb (80.7 kg)  09/09/20 179 lb 11.2 oz (81.5 kg)   Temp Readings from Last 3 Encounters:  02/11/21 98.5 F (36.9 C) (Oral)  08/19/20 98.1 F (36.7 C) (Oral)  07/20/20 97.8 F (36.6 C) (Temporal)   BP Readings from Last 3 Encounters:  02/11/21 128/78  11/06/20 116/70  09/09/20 134/75   Pulse Readings from Last 3 Encounters:  02/11/21 85  11/06/20 90  09/09/20 94    Physical Exam Vitals and nursing note reviewed.  Constitutional:      Appearance: Normal appearance. She is well-developed and well-groomed.  HENT:     Head: Normocephalic and atraumatic.  Eyes:     Conjunctiva/sclera: Conjunctivae normal.     Pupils: Pupils are equal, round, and reactive to light.  Cardiovascular:     Rate and Rhythm: Normal rate and regular rhythm.     Heart sounds: Normal heart sounds. No murmur heard. Pulmonary:     Effort: Pulmonary effort is normal.     Breath sounds: Normal breath sounds.   Abdominal:     General: Abdomen is flat. Bowel sounds are normal.     Tenderness: There is no abdominal tenderness.  Musculoskeletal:     Right shoulder: Tenderness present. Decreased range of motion.  Skin:    General: Skin is warm and dry.  Neurological:     General: No focal deficit present.     Mental Status: She is alert and oriented to person, place, and time. Mental status is at baseline.     Cranial Nerves: Cranial nerves 2-12 are intact.     Motor: Motor function is intact.     Coordination: Coordination is intact.     Gait: Gait is intact.  Psychiatric:        Attention and Perception: Attention and perception normal.        Mood and Affect: Affect normal. Mood is anxious.        Speech: Speech normal.        Behavior: Behavior normal. Behavior is cooperative.        Thought Content: Thought content normal.        Cognition and Memory: Cognition and memory normal.        Judgment: Judgment normal.    Assessment  Plan  Hypertension associated with diabetes ([place]) - Plan: amLODipine (NORVASC) 5 MG tablet, Lancets (ONETOUCH ULTRASOFT) lancets, glucose blood (ONETOUCH ULTRA) test strip Checking bid  norvasc 5 mg qd coreg 25 mg bid entreso 97-103 spironolactone 25 mg qd  A1c controlled 6.5 trulicity 1.5 weekly refills and stips and lancets also on farxiga 10 mg qd   NICM (nonischemic cardiomyopathy) ([place]) - Plan: spironolactone (ALDACTONE) 25 MG tablet  Essential hypertension - Plan: carvedilol (COREG) 25 MG tablet  Chronic right shoulder pain - Plan: DG Shoulder Right  HM Flu utd given utd  Tdap given today prev  shingrix consider given Rx 07/02/20 pna 23 vaccine utd  covid 4/4    mammo ordered 03/2020 negative ordered  Pap 02/05/2018 negative Colonoscopy never had due to anxiety will do cologaurd  ordered +07/29/19 + Referred colonoscopy [facility] today   Urology Dr. Diamantina [place] had kidney stone surgery 10/09/19 and stent removal 10/16/19 [facility] thyroid bx 10/16/19 bx negative  [place] [facility]      Provider: Dr. Olivia Mackie McLean-Scocuzza-Internal Medicine

## 2021-02-11 NOTE — Patient Instructions (Addendum)
Call and ask about lexapro and tell them about movements of your mouth and your concern  For shoulder Tylenol, aspercream with lidocaine or Voltaren gel over the counter     Biotene mouthwash  Sugar free candy    Therapy locations call and see if they take your insurance  [facility] [place] * [address]  [place] [address]  [phone]   [facility] as given the info before for both  [facility] [place]  [place] [phone]   2. [facility] [place] Address: [address], [place], [place] [address] Hours:  Open ? Closes 6?PM Phone: [phone] [facility]     3. [facility]* 5.0 (2)  Mental health service [address]  [phone] Open ? Closes 7?PM  Shoulder Range of Motion Exercises Shoulder range of motion (ROM) exercises are done to keep the shoulder moving freely or to increase movement. They are often recommended for people who have shoulder pain or stiffness or who are recovering from a shoulder surgery. Phase 1 exercises When you are able, do this exercise 1-2 times per day for 30-60 seconds in each direction, or as directed by your health care provider. Pendulum exercise To do this exercise while sitting: Sit in a chair or at the edge of your bed with your feet flat on the floor. Let your affected arm hang down in front of you over the edge of the bed or chair. Relax your shoulder, arm, and hand. Rock your body so your arm gently swings in small circles. You can also use your unaffected arm to start the motion. Repeat changing the direction of the circles, swinging your arm left and right, and swinging your arm forward and back. To do this exercise while standing: Stand next to a sturdy chair or table, and hold on to it with your hand on your unaffected  side. Bend forward at the waist. Bend your knees slightly. Relax your shoulder, arm, and hand. While keeping your shoulder relaxed, use body motion to swing your arm in small circles. Repeat changing the direction of the circles, swinging your arm left and right, and swinging your arm forward and back. Between exercises, stand up tall and take a short break to relax your lower back.  Phase 2 exercises Do these exercises 1-2 times per day or as told by your health care provider. Hold each stretch for 30 seconds, and repeat 3 times. Do the exercises with one or both arms as instructed by your health care provider. For these exercises, sit at a table with your hand and arm supported by the table. A chair that slides easily or has wheels can be helpful. External rotation Turn your chair so that your affected side is nearest to the table. Place your forearm on the table to your side. Bend your elbow about 90 at the elbow (right angle) and place your hand palm facing down on the table. Your elbow should be about 6 inches away from your side. Keeping your arm on the table, lean your body forward. Abduction Turn your chair so that your affected side is nearest to the table. Place your forearm and hand on the table so that your thumb points toward the ceiling and your arm is straight out to your side. Slide your hand out to the side and away from you, using  your unaffected arm to do the work. To increase the stretch, you can slide your chair away from the table. Flexion: forward stretch Sit facing the table. Place your hand and elbow on the table in front of you. Slide your hand forward and away from you, using your unaffected arm to do the work. To increase the stretch, you can slide your chair backward. Phase 3 exercises Do these exercises 1-2 times per day or as told by your health care provider. Hold each stretch for 30 seconds, and repeat 3 times. Do the exercises with one or both arms as  instructed by your health care provider. Cross-body stretch: posterior capsule stretch Lift your arm straight out in front of you. Bend your arm 90 at the elbow (right angle) so your forearm moves across your body. Use your other arm to gently pull the elbow across your body, toward your other shoulder. Wall climbs Stand with your affected arm extended out to the side with your hand resting on a door frame. Slide your hand slowly up the door frame. To increase the stretch, step through the door frame. Keep your body upright and do not lean. Wand exercises You will need a cane, a piece of PVC pipe, or a sturdy wooden dowel for wand exercises. Flexion To do this exercise while standing: Hold the wand with both of your hands, palms down. Using the other arm to help, lift your arms up and over your head, if able. Push upward with your other arm to gently increase the stretch. To do this exercise while lying down: Lie on your back with your elbows resting on the floor and the wand in both your hands. Your hands will be palm down, or pointing toward your feet. Lift your hands toward the ceiling, using your unaffected arm to help if needed. Bring your arms overhead as able, using your unaffected arm to help if needed. Internal rotation Stand while holding the wand behind you with both hands. Your unaffected arm should be extended above your head with the arm of the affected side extended behind you at the level of your waist. The wand should be pointing straight up and down as you hold it. Slowly pull the wand up behind your back by straightening the elbow of your unaffected arm and bending the elbow of your affected arm. External rotation Lie on your back with your affected upper arm supported on a small pillow or rolled towel. When you first do this exercise, keep your upper arm close to your body. Over time, bring your arm up to a 90 angle out to the side. Hold the wand across your stomach and  with both hands palm up. Your elbow on your affected side should be bent at a 90 angle. Use your unaffected side to help push your forearm away from you and toward the floor. Keep your elbow on your affected side bent at a 90 angle. Contact a health care provider if you have: New or increasing pain. New numbness, tingling, weakness, or discoloration in your arm or hand. This information is not intended to replace advice given to you by your health care provider. Make sure you discuss any questions you have with your health care provider. Document Revised: 02/01/2017 Document Reviewed: 02/01/2017 Elsevier Patient Education  20[employer].  Shoulder Exercises Ask your health care provider which exercises are safe for you. Do exercises exactly as told by your health care provider and adjust them as directed. It is normal  to feel mild stretching, pulling, tightness, or discomfort as you do these exercises. Stop right away if you feel sudden pain or your pain gets worse. Do not begin these exercises until told by your health care provider. Stretching exercises External rotation and abduction This exercise is sometimes called corner stretch. This exercise rotates your arm outward (external rotation) and moves your arm out from your body (abduction). Stand in a doorway with one of your feet slightly in front of the other. This is called a staggered stance. If you cannot reach your forearms to the door frame, stand facing a corner of a room. Choose one of the following positions as told by your health care provider: Place your hands and forearms on the door frame above your head. Place your hands and forearms on the door frame at the height of your head. Place your hands on the door frame at the height of your elbows. Slowly move your weight onto your front foot until you feel a stretch across your chest and in the front of your shoulders. Keep your head and chest upright and keep your abdominal  muscles tight. Hold for __________ seconds. To release the stretch, shift your weight to your back foot. Repeat __________ times. Complete this exercise __________ times a day. Extension, standing Stand and hold a broomstick, a cane, or a similar object behind your back. Your hands should be a little wider than shoulder width apart. Your palms should face away from your back. Keeping your elbows straight and your shoulder muscles relaxed, move the stick away from your body until you feel a stretch in your shoulders (extension). Avoid shrugging your shoulders while you move the stick. Keep your shoulder blades tucked down toward the middle of your back. Hold for __________ seconds. Slowly return to the starting position. Repeat __________ times. Complete this exercise __________ times a day. Range-of-motion exercises Pendulum  Stand near a wall or a surface that you can hold onto for balance. Bend at the waist and let your left / right arm hang straight down. Use your other arm to support you. Keep your back straight and do not lock your knees. Relax your left / right arm and shoulder muscles, and move your hips and your trunk so your left / right arm swings freely. Your arm should swing because of the motion of your body, not because you are using your arm or shoulder muscles. Keep moving your hips and trunk so your arm swings in the following directions, as told by your health care provider: Side to side. Forward and backward. In clockwise and counterclockwise circles. Continue each motion for __________ seconds, or for as long as told by your health care provider. Slowly return to the starting position. Repeat __________ times. Complete this exercise __________ times a day. Shoulder flexion, standing  Stand and hold a broomstick, a cane, or a similar object. Place your hands a little more than shoulder width apart on the object. Your left / right hand should be palm up, and your other  hand should be palm down. Keep your elbow straight and your shoulder muscles relaxed. Push the stick up with your healthy arm to raise your left / right arm in front of your body, and then over your head until you feel a stretch in your shoulder (flexion). Avoid shrugging your shoulder while you raise your arm. Keep your shoulder blade tucked down toward the middle of your back. Hold for __________ seconds. Slowly return to the starting position.  Repeat __________ times. Complete this exercise __________ times a day. Shoulder abduction, standing Stand and hold a broomstick, a cane, or a similar object. Place your hands a little more than shoulder width apart on the object. Your left / right hand should be palm up, and your other hand should be palm down. Keep your elbow straight and your shoulder muscles relaxed. Push the object across your body toward your left / right side. Raise your left / right arm to the side of your body (abduction) until you feel a stretch in your shoulder. Do not raise your arm above shoulder height unless your health care provider tells you to do that. If directed, raise your arm over your head. Avoid shrugging your shoulder while you raise your arm. Keep your shoulder blade tucked down toward the middle of your back. Hold for __________ seconds. Slowly return to the starting position. Repeat __________ times. Complete this exercise __________ times a day. Internal rotation  Place your left / right hand behind your back, palm up. Use your other hand to dangle an exercise band, a towel, or a similar object over your shoulder. Grasp the band with your left / right hand so you are holding on to both ends. Gently pull up on the band until you feel a stretch in the front of your left / right shoulder. The movement of your arm toward the center of your body is called internal rotation. Avoid shrugging your shoulder while you raise your arm. Keep your shoulder blade tucked  down toward the middle of your back. Hold for __________ seconds. Release the stretch by letting go of the band and lowering your hands. Repeat __________ times. Complete this exercise __________ times a day. Strengthening exercises External rotation  Sit in a stable chair without armrests. Secure an exercise band to a stable object at elbow height on your left / right side. Place a soft object, such as a folded towel or a small pillow, between your left / right upper arm and your body to move your elbow about 4 inches (10 cm) away from your side. Hold the end of the exercise band so it is tight and there is no slack. Keeping your elbow pressed against the soft object, slowly move your forearm out, away from your abdomen (external rotation). Keep your body steady so only your forearm moves. Hold for __________ seconds. Slowly return to the starting position. Repeat __________ times. Complete this exercise __________ times a day. Shoulder abduction  Sit in a stable chair without armrests, or stand up. Hold a __________ weight in your left / right hand, or hold an exercise band with both hands. Start with your arms straight down and your left / right palm facing in, toward your body. Slowly lift your left / right hand out to your side (abduction). Do not lift your hand above shoulder height unless your health care provider tells you that this is safe. Keep your arms straight. Avoid shrugging your shoulder while you do this movement. Keep your shoulder blade tucked down toward the middle of your back. Hold for __________ seconds. Slowly lower your arm, and return to the starting position. Repeat __________ times. Complete this exercise __________ times a day. Shoulder extension Sit in a stable chair without armrests, or stand up. Secure an exercise band to a stable object in front of you so it is at shoulder height. Hold one end of the exercise band in each hand. Your palms should face each  other.  Straighten your elbows and lift your hands up to shoulder height. Step back, away from the secured end of the exercise band, until the band is tight and there is no slack. Squeeze your shoulder blades together as you pull your hands down to the sides of your thighs (extension). Stop when your hands are straight down by your sides. Do not let your hands go behind your body. Hold for __________ seconds. Slowly return to the starting position. Repeat __________ times. Complete this exercise __________ times a day. Shoulder row Sit in a stable chair without armrests, or stand up. Secure an exercise band to a stable object in front of you so it is at waist height. Hold one end of the exercise band in each hand. Position your palms so that your thumbs are facing the ceiling (neutral position). Bend each of your elbows to a 90-degree angle (right angle) and keep your upper arms at your sides. Step back until the band is tight and there is no slack. Slowly pull your elbows back behind you. Hold for __________ seconds. Slowly return to the starting position. Repeat __________ times. Complete this exercise __________ times a day. Shoulder press-ups  Sit in a stable chair that has armrests. Sit upright, with your feet flat on the floor. Put your hands on the armrests so your elbows are bent and your fingers are pointing forward. Your hands should be about even with the sides of your body. Push down on the armrests and use your arms to lift yourself off the chair. Straighten your elbows and lift yourself up as much as you comfortably can. Move your shoulder blades down, and avoid letting your shoulders move up toward your ears. Keep your feet on the ground. As you get stronger, your feet should support less of your body weight as you lift yourself up. Hold for __________ seconds. Slowly lower yourself back into the chair. Repeat __________ times. Complete this exercise __________ times a  day. Wall push-ups  Stand so you are facing a stable wall. Your feet should be about one arm-length away from the wall. Lean forward and place your palms on the wall at shoulder height. Keep your feet flat on the floor as you bend your elbows and lean forward toward the wall. Hold for __________ seconds. Straighten your elbows to push yourself back to the starting position. Repeat __________ times. Complete this exercise __________ times a day. This information is not intended to replace advice given to you by your health care provider. Make sure you discuss any questions you have with your health care provider. Document Revised: 04/13/2018 Document Reviewed: 01/19/2018 Elsevier Patient Education  [place].

## 2021-02-11 NOTE — Addendum Note (Signed)
Addended by: [place], Shanta Hartner on: 02/11/2021 02:45 PM   Modules accepted: Orders

## 2021-02-15 ENCOUNTER — Ambulatory Visit (INDEPENDENT_AMBULATORY_CARE_PROVIDER_SITE_OTHER): Payer: Medicare HMO

## 2021-02-15 VITALS — BP 120/79 | HR 75 | Ht 70.0 in | Wt 181.0 lb

## 2021-02-15 DIAGNOSIS — Z0000 Encounter for general adult medical examination without abnormal findings: Secondary | ICD-10-CM

## 2021-02-15 NOTE — Patient Instructions (Addendum)
Nancy Barajas , Thank you for taking time to come for your Medicare Wellness Visit. I appreciate your ongoing commitment to your health goals. Please review the following plan we discussed and let me know if I can assist you in the future.   These are the goals we discussed:  Goals       Patient Stated     Medication Monitoring (pt-stated)      Patient Goals/Self-Care Activities Over the next 90 days, patient will:  - take medications as prescribed check glucose twice daily, document, and provide at future appointments. check blood pressure daily, document, and provide at future appointments.        This is a list of the screening recommended for you and due dates:  Health Maintenance  Topic Date Due   Eye exam for diabetics  01/12/2021   Pap Smear  02/05/2021   Complete foot exam   04/01/2021*   Colon Cancer Screening  02/15/2022*   Zoster (Shingles) Vaccine (1 of 2) 05/24/2022*   Hemoglobin A1C  08/08/2021   Mammogram  03/24/2022   Tetanus Vaccine  02/12/2031   Flu Shot  Completed   COVID-19 Vaccine  Completed   Hepatitis C Screening: USPSTF Recommendation to screen - Ages 18-79 yo.  Completed   HIV Screening  Completed   HPV Vaccine  Aged Out  *Topic was postponed. The date shown is not the original due date.    Advanced directives: not yet completed  Conditions/risks identified: none new  Follow up in one year for your annual wellness visit.   Preventive Care 40-64 Years, Female Preventive care refers to lifestyle choices and visits with your health care provider that can promote health and wellness. What does preventive care include? A yearly physical exam. This is also called an annual well check. Dental exams once or twice a year. Routine eye exams. Ask your health care provider how often you should have your eyes checked. Personal lifestyle choices, including: Daily care of your teeth and gums. Regular physical activity. Eating a healthy diet. Avoiding  tobacco and drug use. Limiting alcohol use. Practicing safe sex. Taking low-dose aspirin daily starting at age 18. Taking vitamin and mineral supplements as recommended by your health care provider. What happens during an annual well check? The services and screenings done by your health care provider during your annual well check will depend on your age, overall health, lifestyle risk factors, and family history of disease. Counseling  Your health care provider may ask you questions about your: Alcohol use. Tobacco use. Drug use. Emotional well-being. Home and relationship well-being. Sexual activity. Eating habits. Work and work Statistician. Method of birth control. Menstrual cycle. Pregnancy history. Screening  You may have the following tests or measurements: Height, weight, and BMI. Blood pressure. Lipid and cholesterol levels. These may be checked every 5 years, or more frequently if you are over 64 years old. Skin check. Lung cancer screening. You may have this screening every year starting at age 6 if you have a 30-pack-year history of smoking and currently smoke or have quit within the past 15 years. Fecal occult blood test (FOBT) of the stool. You may have this test every year starting at age 19. Flexible sigmoidoscopy or colonoscopy. You may have a sigmoidoscopy every 5 years or a colonoscopy every 10 years starting at age 72. Hepatitis C blood test. Hepatitis B blood test. Sexually transmitted disease (STD) testing. Diabetes screening. This is done by checking your blood sugar (glucose) after you  have not eaten for a while (fasting). You may have this done every 1-3 years. Mammogram. This may be done every 1-2 years. Talk to your health care provider about when you should start having regular mammograms. This may depend on whether you have a family history of breast cancer. BRCA-related cancer screening. This may be done if you have a family history of breast, ovarian,  tubal, or peritoneal cancers. Pelvic exam and Pap test. This may be done every 3 years starting at age 68. Starting at age 67, this may be done every 5 years if you have a Pap test in combination with an HPV test. Bone density scan. This is done to screen for osteoporosis. You may have this scan if you are at high risk for osteoporosis. Discuss your test results, treatment options, and if necessary, the need for more tests with your health care provider. Vaccines  Your health care provider may recommend certain vaccines, such as: Influenza vaccine. This is recommended every year. Tetanus, diphtheria, and acellular pertussis (Tdap, Td) vaccine. You may need a Td booster every 10 years. Zoster vaccine. You may need this after age 26. Pneumococcal 13-valent conjugate (PCV13) vaccine. You may need this if you have certain conditions and were not previously vaccinated. Pneumococcal polysaccharide (PPSV23) vaccine. You may need one or two doses if you smoke cigarettes or if you have certain conditions. Talk to your health care provider about which screenings and vaccines you need and how often you need them. This information is not intended to replace advice given to you by your health care provider. Make sure you discuss any questions you have with your health care provider. Document Released: 01/16/2015 Document Revised: 09/09/2015 Document Reviewed: 10/21/2014 Elsevier Interactive Patient Education  2017 [place] Prevention in the Home Falls can cause injuries. They can happen to people of all ages. There are many things you can do to make your home safe and to help prevent falls. What can I do on the outside of my home? Regularly fix the edges of walkways and driveways and fix any cracks. Remove anything that might make you trip as you walk through a door, such as a raised step or threshold. Trim any bushes or trees on the path to your home. Use bright outdoor lighting. Clear any  walking paths of anything that might make someone trip, such as rocks or tools. Regularly check to see if handrails are loose or broken. Make sure that both sides of any steps have handrails. Any raised decks and porches should have guardrails on the edges. Have any leaves, snow, or ice cleared regularly. Use sand or salt on walking paths during winter. Clean up any spills in your garage right away. This includes oil or grease spills. What can I do in the bathroom? Use night lights. Install grab bars by the toilet and in the tub and shower. Do not use towel bars as grab bars. Use non-skid mats or decals in the tub or shower. If you need to sit down in the shower, use a plastic, non-slip stool. Keep the floor dry. Clean up any water that spills on the floor as soon as it happens. Remove soap buildup in the tub or shower regularly. Attach bath mats securely with double-sided non-slip rug tape. Do not have throw rugs and other things on the floor that can make you trip. What can I do in the bedroom? Use night lights. Make sure that you have a light by  your bed that is easy to reach. Do not use any sheets or blankets that are too big for your bed. They should not hang down onto the floor. Have a firm chair that has side arms. You can use this for support while you get dressed. Do not have throw rugs and other things on the floor that can make you trip. What can I do in the kitchen? Clean up any spills right away. Avoid walking on wet floors. Keep items that you use a lot in easy-to-reach places. If you need to reach something above you, use a strong step stool that has a grab bar. Keep electrical cords out of the way. Do not use floor polish or wax that makes floors slippery. If you must use wax, use non-skid floor wax. Do not have throw rugs and other things on the floor that can make you trip. What can I do with my stairs? Do not leave any items on the stairs. Make sure that there are  handrails on both sides of the stairs and use them. Fix handrails that are broken or loose. Make sure that handrails are as long as the stairways. Check any carpeting to make sure that it is firmly attached to the stairs. Fix any carpet that is loose or worn. Avoid having throw rugs at the top or bottom of the stairs. If you do have throw rugs, attach them to the floor with carpet tape. Make sure that you have a light switch at the top of the stairs and the bottom of the stairs. If you do not have them, ask someone to add them for you. What else can I do to help prevent falls? Wear shoes that: Do not have high heels. Have rubber bottoms. Are comfortable and fit you well. Are closed at the toe. Do not wear sandals. If you use a stepladder: Make sure that it is fully opened. Do not climb a closed stepladder. Make sure that both sides of the stepladder are locked into place. Ask someone to hold it for you, if possible. Clearly mark and make sure that you can see: Any grab bars or handrails. First and last steps. Where the edge of each step is. Use tools that help you move around (mobility aids) if they are needed. These include: Canes. Walkers. Scooters. Crutches. Turn on the lights when you go into a dark area. Replace any light bulbs as soon as they burn out. Set up your furniture so you have a clear path. Avoid moving your furniture around. If any of your floors are uneven, fix them. If there are any pets around you, be aware of where they are. Review your medicines with your doctor. Some medicines can make you feel dizzy. This can increase your chance of falling. Ask your doctor what other things that you can do to help prevent falls. This information is not intended to replace advice given to you by your health care provider. Make sure you discuss any questions you have with your health care provider. Document Released: 10/16/2008 Document Revised: 05/28/2015 Document Reviewed:  01/24/2014 Elsevier Interactive Patient Education  2017 [employer].

## 2021-02-15 NOTE — Progress Notes (Signed)
Subjective:   Nancy Barajas is a 61 y.o. female who presents for Medicare Annual (Subsequent) preventive examination.  Review of Systems    No ROS.  Medicare Wellness Virtual Visit.  Visual/audio telehealth visit, UTA vital signs.   See social history for additional risk factors.   Cardiac Risk Factors include: advanced age (>76mn, >>44women);diabetes mellitus;hypertension     Objective:    Today's Vitals   02/15/21 1540  BP: 120/79  Pulse: 75  Weight: 181 lb (82.1 kg)  Height: _0  (1.778 m)   Body mass index is 25.97 kg/m.  Advanced Directives 02/15/2021 02/03/2020 12/11/2019 09/27/2019 08/22/2019 05/01/2019 03/27/2019  Does Patient Have a Medical Advance Directive? _1  No No  Would patient like information on creating a medical advance directive? No - Patient declined No - Patient declined Yes (MAU/Ambulatory/Procedural Areas - Information given) No - Patient declined - No - Patient declined No - Patient declined    Current Medications (verified) Outpatient Encounter Medications as of 02/15/2021  Medication Sig   amLODipine (NORVASC) 5 MG tablet Take 1 tablet (5 mg total) by mouth daily.   blood glucose meter kit and supplies Dispense based on patient and insurance preference. Use twice daily as directed. (FOR ICD-10 E10.9, E11.9).   carvedilol (COREG) 25 MG tablet Take 1 tablet (25 mg total) by mouth 2 (two) times daily with a meal.   Cholecalciferol (VITAMIN D-3) 125 MCG (5000 UT) TABS Take 1 tablet by mouth daily.   COVID-19 mRNA bivalent vaccine, Pfizer, (PFIZER COVID-19 VAC BIVALENT) injection Inject into the muscle.   Dulaglutide (TRULICITY) 1.5 MQB/3.4LPSOPN Inject 1.5 mg into the skin once a week.   escitalopram (LEXAPRO) 5 MG tablet Take 5 mg by mouth daily.   FARXIGA 10 MG TABS tablet Take 1 tablet (10 mg total) by mouth daily.   glucose blood (ONETOUCH ULTRA) test strip USE TO CHECK BLOOD SUGAR TWICE DAILY AS DIRECTED   Lancets (ONETOUCH ULTRASOFT)  lancets Use as instructed bid   Lancets MISC 1 Device by Does not apply route in the morning, at noon, and at bedtime. Dispense for onetouch ultra 2 machine ONLY   LORazepam (ATIVAN) 0.5 MG tablet Take 0.5-1 tablets (0.25-0.5 mg total) by mouth daily as needed for anxiety.   montelukast (SINGULAIR) 10 MG tablet Take 1 tablet by mouth once daily   Multiple Vitamins-Minerals (CENTRUM SILVER 50+WOMEN PO) Take 1 tablet by mouth daily.   rosuvastatin (CRESTOR) 20 MG tablet Take 1 tablet (20 mg total) by mouth daily.   sacubitril-valsartan (ENTRESTO) 97-103 MG Take 1 tablet by mouth 2 (two) times daily.   sodium bicarbonate 650 MG tablet Take 1 tablet (650 mg total) by mouth 2 (two) times daily.   sodium chloride (OCEAN) 0.65 % SOLN nasal spray Place 1 spray into both nostrils as needed for congestion.    spironolactone (ALDACTONE) 25 MG tablet Take 1 tablet (25 mg total) by mouth daily.   Facility-Administered Encounter Medications as of 02/15/2021  Medication   albuterol (PROVENTIL) (2.5 MG/3ML) 0.083% nebulizer solution 2.5 mg    Allergies (verified) Penicillins   History: Past Medical History:  Diagnosis Date   Anxiety    Asthma    Depression    Diabetes mellitus without complication ([place]    History of kidney stones    Hypertension    Kidney stones    Vertigo    when laying flat   Vocal cord dysfunction    makes it hard for  patient to talk and breathe   Past Surgical History:  Procedure Laterality Date   CARDIAC CATHETERIZATION     CYSTOSCOPY W/ RETROGRADES Right 09/27/2019   Procedure: CYSTOSCOPY WITH RETROGRADE PYELOGRAM;  Surgeon: Billey Co, MD;  Location: [facility];  Service: Urology;  Laterality: Right;   CYSTOSCOPY/URETEROSCOPY/HOLMIUM LASER/STENT PLACEMENT Left 09/27/2019   Procedure: CYSTOSCOPY/URETEROSCOPY/HOLMIUM LASER/STENT PLACEMENT;  Surgeon: Billey Co, MD;  Location: [facility];  Service: Urology;  Laterality: Left;   LITHOTRIPSY     RIGHT/LEFT HEART  CATH AND CORONARY ANGIOGRAPHY Bilateral 04/08/2019   Procedure: RIGHT/LEFT HEART CATH AND CORONARY ANGIOGRAPHY;  Surgeon: Wellington Hampshire, MD;  Location: [facility];  Service: Cardiovascular;  Laterality: Bilateral;   Family History  Problem Relation Age of Onset   Hypertension Mother    Diabetes Mother    Hypertension Father    Diabetes Father    Breast cancer Neg Hx    Social History   Socioeconomic History   Marital status: Divorced    Spouse name: Not on file   Number of children: 2   Years of education: Not on file   Highest education level: 9th grade  Occupational History   Not on file  Tobacco Use   Smoking status: Former    Packs/day: 1.00    Years: 10.00    Pack years: 10.00    Types: Cigarettes    Quit date: 01/03/1998    Years since quitting: 23.1   Smokeless tobacco: Never  Vaping Use   Vaping Use: Never used  Substance and Sexual Activity   Alcohol use: Not Currently   Drug use: Not on file    Comment: years ago   Sexual activity: Not Currently  Other Topics Concern   Not on file  Social History Narrative   Lives at home    2 daughters as of 04/04/19 16 y.o daughter lives with her (she works)   Patient is on disability.   Social Determinants of Health   Financial Resource Strain: Low Risk    Difficulty of Paying Living Expenses: Not hard at all  Food Insecurity: Not on file  Transportation Needs: No Transportation Needs   Lack of Transportation (Medical): No   Lack of Transportation (Non-Medical): No  Physical Activity: Not on file  Stress: Stress Concern Present   Feeling of Stress : To some extent  Social Connections: Unknown   Frequency of Communication with Friends and Family: Three times a week   Frequency of Social Gatherings with Friends and Family: More than three times a week   Attends Religious Services: Never   Marine scientist or Organizations: Yes   Attends Archivist Meetings: Never   Marital Status: Not on  file    Tobacco Counseling Counseling given: Not Answered   Clinical Intake:  Pre-visit preparation completed: Yes        Diabetes: Yes (Followed by PCP)  How often do you need to have someone help you when you read instructions, pamphlets, or other written materials from your doctor or pharmacy?: 1 - Never    Interpreter Needed?: No    Activities of Daily Living In your present state of health, do you have any difficulty performing the following activities: 02/15/2021  Hearing? N  Vision? N  Difficulty concentrating or making decisions? N  Walking or climbing stairs? Y  Comment Paces self when walking  Dressing or bathing? N  Doing errands, shopping? Y  Comment [place] and  eating ? N  Using the Toilet? N  In the past six months, have you accidently leaked urine? N  Do you have problems with loss of bowel control? N  Managing your Medications? N  Managing your Finances? N  Housekeeping or managing your Housekeeping? N  Comment Paces self  Some recent data might be hidden    Patient Care Team: McLean-Scocuzza, Nino Glow, MD as PCP - General (Internal Medicine) Kate Sable, MD as PCP - Cardiology (Cardiology) De Hollingshead, RPH-CPP as Pharmacist (Pharmacist) [name], RN as Case Manager [place], [place], LCSW as Social Worker  Indicate any recent Medical Services you may have received from other than [facility] providers in the past year (date may be approximate).     Assessment:   This is a routine wellness examination for [facility].  Virtual Visit via Telephone Note  I connected with  [name] on 02/15/21 at  3:30 PM EST by telephone and verified that I am speaking with the correct person using two identifiers.  Persons participating in the virtual visit: patient/Nurse Health Advisor   I discussed the limitations, risks, security and privacy concerns of performing an evaluation and management service by  telephone and the availability of in person appointments. The patient expressed understanding and agreed to proceed.  Interactive audio and video telecommunications were attempted between this nurse and patient, however failed, due to patient having technical difficulties OR patient did not have access to video capability.  We continued and completed visit with audio only.  Some vital signs may be absent or patient reported.   Hearing/Vision screen Hearing Screening - Comments:: Patient is able to hear conversational tones without difficulty. No issues reported.  Vision Screening - Comments:: Wears corrective lenses  They have seen their ophthalmologist in the last 12 months.   Dietary issues and exercise activities discussed: Current Exercise Habits: The patient does not participate in regular exercise at present   Goals Addressed               This Visit's Progress     Patient Stated     Medication Monitoring (pt-stated)        Patient Goals/Self-Care Activities Over the next 90 days, patient will:  - take medications as prescribed check glucose twice daily, document, and provide at future appointments. check blood pressure daily, document, and provide at future appointments.       Depression Screen PHQ 2/9 Scores 02/15/2021 07/02/2020 02/03/2020 01/16/2020 12/11/2019 12/05/2019 05/01/2019  PHQ - 2 Score 0 2 1 - _0 PHQ- 9 Score - 11 - - - - 5  Exception Documentation - - - Other- indicate reason in comment box - - -    Fall Risk Fall Risk  02/15/2021 07/02/2020 02/19/2020 02/03/2020 12/11/2019  Falls in the past year? 0 0 0 0 0  Number falls in past yr: 0 0 0 0 0  Injury with Fall? - 0 0 0 0  Comment - - - - -  Risk for fall due to : - - - Medication side effect;Impaired vision Medication side effect  Risk for fall due to: Comment - - - - -  Follow up Falls evaluation completed Falls evaluation completed Falls evaluation completed Falls evaluation completed;Education  provided;Falls prevention discussed Education provided;Falls prevention discussed;Falls evaluation completed    FALL RISK PREVENTION PERTAINING TO THE HOME: Home free of loose throw rugs in walkways, pet beds, electrical cords, etc? Yes  Adequate lighting in your home  to reduce risk of falls? Yes   ASSISTIVE DEVICES UTILIZED TO PREVENT FALLS: Life alert? No  Use of a cane, walker or w/c? No   TIMED UP AND GO: Was the test performed? No .   Cognitive Function:     6CIT Screen 02/15/2021 01/15/2019  What Year? 0 points 0 points  What month? 0 points 0 points  What time? 0 points 0 points  Count back from 20 0 points 0 points  Months in reverse 0 points 0 points  Repeat phrase 0 points 0 points  Total Score 0 0    Immunizations Immunization History  Administered Date(s) Administered   Influenza,inj,Quad PF,6+ Mos 10/10/2017, 11/07/2018, 11/12/2019, 01/08/2021   PFIZER(Purple Top)SARS-COV-2 Vaccination 05/23/2019, 06/13/2019, 12/10/2019   Pfizer Covid-19 Vaccine Bivalent Booster 68yr & up 01/12/2021   Pneumococcal Polysaccharide-23 07/10/2019   Tdap 02/11/2021   Screening Tests Health Maintenance  Topic Date Due   OPHTHALMOLOGY EXAM  01/12/2021   PAP SMEAR-Modifier  02/05/2021   FOOT EXAM  04/01/2021 (Originally 11/11/2020)   COLONOSCOPY (Pts [phone] coverage will need to be confirmed)  02/15/2022 (Originally 11/02/2004)   Zoster Vaccines- Shingrix (1 of 2) 05/24/2022 (Originally 11/03/1978)   HEMOGLOBIN A1C  08/08/2021   MAMMOGRAM  03/24/2022   TETANUS/TDAP  02/12/2031   INFLUENZA VACCINE  Completed   COVID-19 Vaccine  Completed   Hepatitis C Screening  Completed   HIV Screening  Completed   HPV VACCINES  Aged Out   Health Maintenance Health Maintenance Due  Topic Date Due   OPHTHALMOLOGY EXAM  01/12/2021   PAP SMEAR-Modifier  02/05/2021   Colonoscopy-deferred per patient  Mammogram- plans to schedule  Vision Screening: Recommended annual  ophthalmology exams for early detection of glaucoma and other disorders of the eye.  Dental Screening: Recommended annual dental exams for proper oral hygiene  Community Resource Referral / Chronic Care Management: CRR required this visit?  No   CCM required this visit?  No      Plan:     I have personally reviewed and noted the following in the patients chart:   Medical and social history Use of alcohol, tobacco or illicit drugs  Current medications and supplements including opioid prescriptions.  Functional ability and status Nutritional status Physical activity Advanced directives List of other physicians Hospitalizations, surgeries, and ER visits in previous 12 months Vitals Screenings to include cognitive, depression, and falls Referrals and appointments  In addition, I have reviewed and discussed with patient certain preventive protocols, quality metrics, and best practice recommendations. A written personalized care plan for preventive services as well as general preventive health recommendations were provided to patient.     OBVarney BilesLPN   02/04/13/8727

## 2021-02-17 ENCOUNTER — Telehealth: Payer: Self-pay | Admitting: *Deleted

## 2021-02-17 ENCOUNTER — Telehealth: Payer: Medicare HMO | Admitting: *Deleted

## 2021-02-17 NOTE — Telephone Encounter (Signed)
°  Care Management   Follow Up Note   02/17/2021 Name: Rowen Hur MRN: [id number] DOB: May 23, 1960   Referred by: McLean-Scocuzza, Nino Glow, MD Reason for referral : Care Coordination   An unsuccessful telephone outreach was attempted today. The patient was referred to the case management team for assistance with care management and care coordination.   Follow Up Plan: Telephone follow up appointment with care management team member to be re-scheduled by careguide:  Elliot Gurney, [place] [phone]

## 2021-02-24 ENCOUNTER — Ambulatory Visit (INDEPENDENT_AMBULATORY_CARE_PROVIDER_SITE_OTHER): Payer: Medicare HMO | Admitting: *Deleted

## 2021-02-24 DIAGNOSIS — R498 Other voice and resonance disorders: Secondary | ICD-10-CM

## 2021-02-24 DIAGNOSIS — E119 Type 2 diabetes mellitus without complications: Secondary | ICD-10-CM

## 2021-02-24 DIAGNOSIS — I10 Essential (primary) hypertension: Secondary | ICD-10-CM

## 2021-02-24 NOTE — Patient Instructions (Addendum)
Visit Information  Thank you for taking time to visit with me today. Please don't hesitate to contact me if I can be of assistance to you before our next scheduled telephone appointment.  Following are the goals we discussed today:  General Contact pulmonology for CPAP instructions Discussed speaking with family /brother to assist with transportation to get CPAP Please contact A local counselor is Barbera Setters, PhD, [facility]. You can use the information below to contact her [address] and [place], [place] [address]   [phone] contact Care Guide for possible instructions or guidance Diabetes Check blood sugar at least twice a day (before meals or 2 hours post eating) Check blood sugar if I feel it is too high or too low Enter blood sugar readings and medication into daily log and take log to provider appointments Set target A1C; current A1C decreased to 6.3(07/22) Discuss with PCP goal Hgb A1C  HTN Continue to monitor blood pressures daily, noting if blood pressures elevated if prior to taking medications and to recheck after taking morning medications CHF Continue to monitor weights daily and keeping in log Weigh self first thing in the mornings after voiding Notify provider for increase weight 3 pounds overnight of 5 pounds within 5 days or lower extremity swelling  Our next appointment is by telephone on 3/31 at 1000  Please call the care guide team at [phone] if you need to cancel or reschedule your appointment.   If you are experiencing a Mental Health or [place] or need someone to talk to, please call the Suicide and Crisis Lifeline: 988 call the [place] National Suicide Prevention Lifeline: (573) 380-3664 or TTY: 952-768-9804 TTY 563-877-9274) to talk to a trained counselor call 1-800-273-TALK (toll free, 24 hour hotline) call 911   Patient verbalizes understanding of instructions and care plan provided today and agrees to view in [place].  Active MyChart status confirmed with patient.    Hubert Azure RN, MSN RN Care Management Coordinator [place] [phone] [email or web address]

## 2021-02-25 ENCOUNTER — Ambulatory Visit (INDEPENDENT_AMBULATORY_CARE_PROVIDER_SITE_OTHER): Payer: Medicare HMO | Admitting: Psychology

## 2021-02-25 DIAGNOSIS — F32A Depression, unspecified: Secondary | ICD-10-CM | POA: Diagnosis not present

## 2021-02-25 DIAGNOSIS — F411 Generalized anxiety disorder: Secondary | ICD-10-CM

## 2021-02-25 DIAGNOSIS — R69 Illness, unspecified: Secondary | ICD-10-CM | POA: Diagnosis not present

## 2021-02-25 NOTE — Progress Notes (Signed)
[facility] Counselor/Therapist Progress Note  Patient ID: Sarabelle Genson, MRN: [id number]    Date: 02/25/21  Time Spent: 3:07 pm - 3:49 pm : 42 Minutes  Treatment Type: Individual Therapy.  Reported Symptoms: Depression and Anxiety.  Mental Status Exam: Appearance:  Casual and Well Groomed     Behavior: Appropriate  Motor: Normal  Speech/Language:  Slow  Affect: Congruent  Mood: anxious  Thought process: normal  Thought content:   WNL  Sensory/Perceptual disturbances:   WNL  Orientation: oriented to person, place, time/date, and situation  Attention: Good  Concentration: Good  Memory: WNL  Fund of knowledge:  Good  Insight:   Good  Judgment:  Good  Impulse Control: Good   Risk Assessment: Danger to Self:  No Self-injurious Behavior: No Danger to Others: No Duty to Warn:no Physical Aggression / Violence:No  Access to Firearms a concern: No  Gang Involvement:No   Subjective:   [name] participated from home, via video, and consented to treatment. Therapist participated from home office. We met online due to [place] pandemic. Kyani reviewed the events of the past week. 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  She discussed being referred to some local therapeutic providers but noted they do not accept her insurance.  We worked on identifying additional possible resources including resources in [place] and nearby adjacent areas.  Therapist encouraged [name] to follow up with providers and discuss her current concerns and whether or not they accept her insurance and provided specific treatment that she is in search of.  [name] noted her upcoming nurse visit at home.  Therapist encouraged [name] to discuss the specific needs with her nurse during this visit for additional resources.  Finally, therapist encouraged [name] to contact her insurance company to aid in case  management regarding this issue.  Additionally [name] noted her need to be more involved with church but noted the barriers including her current church which does not seem to be a good fit.  We explored this in the session worked identifying ways to address this need.  Therapist encouraged [name] to engage in problem solving, to look at possible new churches that she might want to visit, and to follow up on this as she noted that this improves her mood greatly.  [name] was concerned regarding her current psychotropic medication.  Therapist encouraged [name] to follow up with her prescriber.  Therapist provided psychoeducation regarding antidepressants and anxiolytics.  [name] was engaged and motivated during the session her vocal cord dysfunction was occurring higher in the beginning of the session much additional rate towards the end.  She was engaged and motivated and expressed her commitment towards her goals.  Therapist praised [name] validated and normalized her feelings and provided supportive therapy Tye Maryland is due for an annual reassessment which will be completed during her follow-up.  Interventions:  Psycho-education  Diagnosis:  GAD (generalized anxiety disorder)  Depressive disorder  Treatment Plan:  Client Abilities/Strengths Lavonn is forthcoming and motivated for change.   Support System: [employer] OPT  Client Statement of Needs Anvika would like to focus on herself, manage her own symptoms, be more engaged in activity.   Treatment Level Weekly  Symptoms  Anxiety: Decreased consistent worry (COVID, daughter, Terrified of elevators [improved]), muscle tension, decreased panic attacks (2-3x per week), decreased difficulty managing worry, easily fatigued, slightly decreased disturbed sleep (erratic sleep schedule),  Claustrophobia. (Status: maintained) Depression:  frequent sadness, improved low motivation, loss of interest, denied any SI.   Shame/embarrassment regarding vocal cord disorder.    (Status: maintained)  Goals:   Target Date: 03/05/21 Frequency: Weekly  Progress: 0 Modality: individual    Therapist will provide referrals for additional resources as appropriate.  Therapist will provide psycho-education regarding Glenette's diagnosis and corresponding treatment approaches and interventions. Licensed Clinical Social Worker, [place], LCSW will support the patient's ability to achieve the goals identified. will employ CBT, BA, Problem-solving, Solution Focused, Mindfulness,  coping skills, & other evidenced-based practices will be used to promote progress towards healthy functioning to help manage decrease symptoms associated with her diagnosis.   Reduce overall level, frequency, and intensity of the feelings of depression, anxiety and panic evidenced by decreased overall symptoms from 6 to 7 days/week to 0 to 1 days/week per client report for at least 3 consecutive months. Verbally express understanding of the relationship between feelings of depression, anxiety and their impact on thinking patterns and behaviors. Verbalize an understanding of the role that distorted thinking plays in creating fears, excessive worry, and ruminations.  [name] participated in the creation of the treatment plan)   Buena Irish, LCSW

## 2021-02-28 DIAGNOSIS — R69 Illness, unspecified: Secondary | ICD-10-CM | POA: Diagnosis not present

## 2021-02-28 DIAGNOSIS — R32 Unspecified urinary incontinence: Secondary | ICD-10-CM | POA: Diagnosis not present

## 2021-02-28 DIAGNOSIS — E261 Secondary hyperaldosteronism: Secondary | ICD-10-CM | POA: Diagnosis not present

## 2021-02-28 DIAGNOSIS — G4733 Obstructive sleep apnea (adult) (pediatric): Secondary | ICD-10-CM | POA: Diagnosis not present

## 2021-02-28 DIAGNOSIS — I110 Hypertensive heart disease with heart failure: Secondary | ICD-10-CM | POA: Diagnosis not present

## 2021-02-28 DIAGNOSIS — E119 Type 2 diabetes mellitus without complications: Secondary | ICD-10-CM | POA: Diagnosis not present

## 2021-02-28 DIAGNOSIS — Z604 Social exclusion and rejection: Secondary | ICD-10-CM | POA: Diagnosis not present

## 2021-02-28 DIAGNOSIS — N200 Calculus of kidney: Secondary | ICD-10-CM | POA: Diagnosis not present

## 2021-02-28 DIAGNOSIS — R0981 Nasal congestion: Secondary | ICD-10-CM | POA: Diagnosis not present

## 2021-02-28 DIAGNOSIS — I509 Heart failure, unspecified: Secondary | ICD-10-CM | POA: Diagnosis not present

## 2021-02-28 DIAGNOSIS — E785 Hyperlipidemia, unspecified: Secondary | ICD-10-CM | POA: Diagnosis not present

## 2021-02-28 NOTE — Chronic Care Management (AMB) (Signed)
Chronic Care Management   CCM RN Visit Note  02/28/2021 Name: Chania Kochanski MRN: [id number] DOB: 1960/04/16  Subjective: Elmo Shumard is a 61 y.o. year old female who is a primary care patient of McLean-Scocuzza, Nino Glow, MD. The care management team was consulted for assistance with disease management and care coordination needs.    Engaged with patient by telephone for follow up visit in response to provider referral for case management and/or care coordination services.   Consent to Services:  The patient was given information about Chronic Care Management services, agreed to services, and gave verbal consent prior to initiation of services.  Please see initial visit note for detailed documentation.   Patient agreed to services and verbal consent obtained.   Assessment: Review of patient past medical history, allergies, medications, health status, including review of consultants reports, laboratory and other test data, was performed as part of comprehensive evaluation and provision of chronic care management services.   SDOH (Social Determinants of Health) assessments and interventions performed:    CCM Care Plan  Allergies  Allergen Reactions   Penicillins Rash    Did it involve swelling of the face/tongue/throat, SOB, or low BP? No Did it involve sudden or severe rash/hives, skin peeling, or any reaction on the inside of your mouth or nose? No Did you need to seek medical attention at a hospital or doctor's office? No When did it last happen?      Childhood If all above answers are NO, may proceed with cephalosporin use.    Outpatient Encounter Medications as of 02/24/2021  Medication Sig   amLODipine (NORVASC) 5 MG tablet Take 1 tablet (5 mg total) by mouth daily.   blood glucose meter kit and supplies Dispense based on patient and insurance preference. Use twice daily as directed. (FOR ICD-10 E10.9, E11.9).   carvedilol (COREG) 25 MG tablet Take 1 tablet (25 mg  total) by mouth 2 (two) times daily with a meal.   Cholecalciferol (VITAMIN D-3) 125 MCG (5000 UT) TABS Take 1 tablet by mouth daily.   COVID-19 mRNA bivalent vaccine, Pfizer, (PFIZER COVID-19 VAC BIVALENT) injection Inject into the muscle.   Dulaglutide (TRULICITY) 1.5 GY/1.7CB SOPN Inject 1.5 mg into the skin once a week.   escitalopram (LEXAPRO) 5 MG tablet Take 5 mg by mouth daily.   FARXIGA 10 MG TABS tablet Take 1 tablet (10 mg total) by mouth daily.   glucose blood (ONETOUCH ULTRA) test strip USE TO CHECK BLOOD SUGAR TWICE DAILY AS DIRECTED   Lancets (ONETOUCH ULTRASOFT) lancets Use as instructed bid   Lancets MISC 1 Device by Does not apply route in the morning, at noon, and at bedtime. Dispense for onetouch ultra 2 machine ONLY   LORazepam (ATIVAN) 0.5 MG tablet Take 0.5-1 tablets (0.25-0.5 mg total) by mouth daily as needed for anxiety.   montelukast (SINGULAIR) 10 MG tablet Take 1 tablet by mouth once daily   Multiple Vitamins-Minerals (CENTRUM SILVER 50+WOMEN PO) Take 1 tablet by mouth daily.   rosuvastatin (CRESTOR) 20 MG tablet Take 1 tablet (20 mg total) by mouth daily.   sacubitril-valsartan (ENTRESTO) 97-103 MG Take 1 tablet by mouth 2 (two) times daily.   sodium bicarbonate 650 MG tablet Take 1 tablet (650 mg total) by mouth 2 (two) times daily.   sodium chloride (OCEAN) 0.65 % SOLN nasal spray Place 1 spray into both nostrils as needed for congestion.    spironolactone (ALDACTONE) 25 MG tablet Take 1 tablet (25 mg total) by mouth  daily.   Facility-Administered Encounter Medications as of 02/24/2021  Medication   albuterol (PROVENTIL) (2.5 MG/3ML) 0.083% nebulizer solution 2.5 mg    Patient Active Problem List   Diagnosis Date Noted   Skin tag 07/02/2020   Seborrheic keratosis 07/02/2020   Anxiety and depression 02/19/2020   Obstructive uropathy 08/01/2019   Gallstones 00/76/2263   Umbilical hernia 33/54/5625   Hematuria 07/15/2019   Hypertension associated with  diabetes ([place]) 07/11/2019   Vitamin D deficiency 07/11/2019   Hiatal hernia 07/11/2019   Obesity (BMI 30.0-34.9) 07/11/2019   Heart failure with reduced ejection fraction (HCC)    GAD (generalized anxiety disorder) 04/06/2019   Proteinuria 03/04/2019   Aortic atherosclerosis ([place]) 03/04/2019   Thyroid nodule 03/04/2019   Kidney cysts 03/04/2019   Hemangioma of liver 03/04/2019   Sinusitis 03/04/2019   Kidney stones    CKD stage 3 secondary to diabetes ([place]) 08/22/2018   Laryngeal spasm 05/07/2018   Muscle tension dysphonia 05/07/2018   Shortness of breath 01/22/2018   Uncontrolled hypertension 01/22/2018   Type 2 diabetes mellitus without complication, without long-term current use of insulin ([place]) 01/22/2018   Mixed hyperlipidemia 01/22/2018   Mild persistent asthma 01/22/2018    Conditions to be addressed/monitored:HTN and DMII  Care Plan : Diabetes/Heart Failure/Hypertension  Updates made by [name], RN since 02/28/2021 12:00 AM     Problem: Management of Diabetes/CHF/HTN   Priority: Medium     Long-Range Goal: Management of Diabetes/CHF/HTN   Start Date: 12/11/2019  Expected End Date: 05/02/2021  Recent Progress: On track  Priority: Medium  Note:   Objective:  Lab Results  Component Value Date   HGBA1C 6.5 02/08/2021  Current Barriers:  Knowledge deficit related to self care management of chronic medical conditions;  monitor blood pressures daily (recent ranges 120-130/80-90's), and check blood sugars about twice a day (80-110's).  Has been very good with keeping vital sign log. Denies any lower extremity edema and denies any increase in shortness of breath.  Continues to weigh self daily (175.6).  Continues to have episodes with voice and has been taking Ativan and Baclofen (dose increased) as needed with some relief. Has seen specialist at [facility].  Continues to look for therapist for functional movement disorder, researching at web site provided by specialist at  [facility].   Has obtained charity CPAP, but unsure how to use it; encouraged to contact Care Guide for possible instructions or guidance.  Denies any issues or major concerns Social issues and knowledge deficits affecting self care management of chronic conditions: Diabetes, Hypertension, Heart Failure  Case Manager Clinical Goal(s):  Collaboration with McLean-Scocuzza, Nino Glow, MD regarding development and update of comprehensive plan of care as evidenced by provider attestation and co-signature Inter-disciplinary care team collaboration (see longitudinal plan of care) Over the next 120days, patient will demonstrate improved adherence to prescribed treatment plan for diabetes self care/management as evidenced by: compliance with taking Trulicity-states she has been taking this medication as prescribed Encourage lifestyle changes, such as increased intake of plant-based foods, stress reduction, consistent physical activity and medication compliance to prevent long-term complications and chronic disease.   Encourage daily monitoring and recording of CBG, blood pressure, and weights Discuss importance of adherence to ADA/ carb modified Heart Healthy diet Discuss importance of adherence to prescribed medication regimen Case Manager Interventions:   General Reviewed medications with patient and discussed importance of medication adherence; discussed taking prescribed anxiety medication to help with voice and increase anxiety episodes Discussed plans with patient  for ongoing care management follow up and provided patient with direct contact information for care management team Encouraged to continue to attend therapy/counseling sessions to help with depression Discussed using deep breathing exercises when feeling anxious Encouraged to keep and attend scheduled medical appointments Congratulated patient on keeping log of vitals for easy provider review Emotional Support and empathy provided to  patient Encouraged to review web site suggested  by [facility] specialist for therapist A local counselor is Barbera Setters, PhD, [facility]. You can use the information below to contact her [address] and [place], [place] [address] [phone] Diabetes    Yes progregrssing     Long Term Goal Advised patient, providing education and rationale, to check cbg at least twice a day and record, calling primary care provider or CCM Embedded Pharmacist for findings outside established parameters.   Congratulated patient on current CBG trends and encouraged to review what her goal A1C should be with provider Reviewed current glucose readings and Hgb A1C and congratulated on current readings and discussed ways to keep them within range HTN  Yes progregrssing     Long Term Goal Encouraged to continue to monitor blood pressures daily, and to notify provider for blood pressures elevations or lows CHF    Yes progregrssing     Long Term Goal Continue to monitor weights daily, first thing in the morning when she wakens after voiding and to notify provider for 3 pound weight gain overnight or 5 pounds within 1 week  Encouraged to monitor feet, legs, and abdomen for swelling Patient Goals/Self-Care Activities Over the next 120 days, patient will:  General Contact pulmonology for CPAP instructions Discussed speaking with family /brother to assist with transportation to get CPAP Please contact A local counselor is Barbera Setters, PhD, [facility]. You can use the information below to contact her [address] and [place], [place] [address]   [phone] contact Care Guide for possible instructions or guidance Diabetes Check blood sugar at least twice a day (before meals or 2 hours post eating) Check blood sugar if I feel it is too high or too low Enter blood sugar readings and medication into daily log and take log to provider appointments Set target A1C; current A1C decreased to 6.3(07/22) Discuss  with PCP goal Hgb A1C  HTN Continue to monitor blood pressures daily, noting if blood pressures elevated if prior to taking medications and to recheck after taking morning medications CHF Continue to monitor weights daily and keeping in log Weigh self first thing in the mornings after voiding Notify provider for increase weight 3 pounds overnight of 5 pounds within 5 days or lower extremity swelling Follow Up Plan: The care management team will reach out to the patient again over the next 45 business days.        Plan:The care management team will reach out to the patient again over the next 45 days.  Hubert Azure RN, MSN RN Care Management Coordinator [place] [phone] [email or web address]

## 2021-03-02 DIAGNOSIS — E119 Type 2 diabetes mellitus without complications: Secondary | ICD-10-CM

## 2021-03-02 DIAGNOSIS — I10 Essential (primary) hypertension: Secondary | ICD-10-CM

## 2021-03-09 ENCOUNTER — Ambulatory Visit: Payer: Self-pay | Admitting: Pharmacist

## 2021-03-09 NOTE — Chronic Care Management (AMB) (Signed)
?  Chronic Care Management  ? ?Note ? ?03/09/2021 ?Name: Nancy Barajas MRN: [id number] DOB: 1960/01/05 ? ? ? ?Closing pharmacy CCM case at this time. Continue scheduled follow up with RN CM. Patient has clinic contact information for future questions or concerns.  ? ?[name], PharmD, [place], CPP ?Clinical Pharmacist ?Therapist, music at [employer] ?[phone] ? ?

## 2021-03-25 ENCOUNTER — Ambulatory Visit: Payer: Medicare HMO | Admitting: Psychology

## 2021-04-02 ENCOUNTER — Ambulatory Visit (INDEPENDENT_AMBULATORY_CARE_PROVIDER_SITE_OTHER): Payer: Medicare HMO | Admitting: *Deleted

## 2021-04-02 DIAGNOSIS — E119 Type 2 diabetes mellitus without complications: Secondary | ICD-10-CM

## 2021-04-02 DIAGNOSIS — I10 Essential (primary) hypertension: Secondary | ICD-10-CM | POA: Diagnosis not present

## 2021-04-02 NOTE — Patient Instructions (Addendum)
Visit Information ? ?Thank you for taking time to visit with me today. Please don't hesitate to contact me if I can be of assistance to you before our next scheduled telephone appointment. ? ?Following are the goals we discussed today:  ?Contact pulmonology for CPAP instructions ?Discussed speaking with family /brother to assist with transportation to get CPAP ?Please contact A local counselor is Nancy Setters, PhD, [facility]. You can use the information below to contact her ?[address] and [place], [place] [address]   [phone] ?contact pulmonologist for CPAP education ?Diabetes ?Check blood sugar at least twice a day (before meals or 2 hours post eating) ?Check blood sugar if I feel it is too high or too low ?Enter blood sugar readings and medication into daily log and take log to provider appointments ?Set target A1C; current A1C decreased to 6.3(07/22) ?Discuss with PCP goal Hgb A1C  ?HTN ?Continue to monitor blood pressures daily, noting if blood pressures elevated if prior to taking medications and to recheck after taking morning medications ?CHF ?Continue to monitor weights daily and keeping in log ?Weigh self first thing in the mornings after voiding ?Notify provider for increase weight 3 pounds overnight of 5 pounds within 5 days or lower extremity swelling ? ?Our next appointment is by telephone on 5/5 at 1045 ? ?Please call the care guide team at [phone] if you need to cancel or reschedule your appointment.  ? ?If you are experiencing a Mental Health or [place] or need someone to talk to, please call the Suicide and Crisis Lifeline: 988 ?call the [place] National Suicide Prevention Lifeline: 740-644-1350 or TTY: 8707832485 TTY (828) 377-1825) to talk to a trained counselor ?call 1-800-273-TALK (toll free, 24 hour hotline) ?call 911  ? ?Patient verbalizes understanding of instructions and care plan provided today and agrees to view in [place]. Active MyChart status  confirmed with patient.   ? ?Hubert Azure RN, MSN ?RN Care Management Coordinator ?[place] ?[phone] ?[email or web address] ? ?

## 2021-04-03 NOTE — Chronic Care Management (AMB) (Signed)
?Chronic Care Management  ? ?CCM RN Visit Note ? ?04/03/2021 ?Name: Nancy Barajas MRN: [id number] DOB: 30-Apr-1960 ? ?Subjective: ?Nancy Barajas is a 61 y.o. year old female who is a primary care patient of McLean-Scocuzza, Nino Glow, MD. The care management team was consulted for assistance with disease management and care coordination needs.   ? ?Engaged with patient by telephone for follow up visit in response to provider referral for case management and/or care coordination services.  ? ?Consent to Services:  ?The patient was given information about Chronic Care Management services, agreed to services, and gave verbal consent prior to initiation of services.  Please see initial visit note for detailed documentation.  ? ?Patient agreed to services and verbal consent obtained.  ? ?Assessment: Review of patient past medical history, allergies, medications, health status, including review of consultants reports, laboratory and other test data, was performed as part of comprehensive evaluation and provision of chronic care management services.  ? ?SDOH (Social Determinants of Health) assessments and interventions performed:   ? ?CCM Care Plan ? ?Allergies  ?Allergen Reactions  ? Penicillins Rash  ?  Did it involve swelling of the face/tongue/throat, SOB, or low BP? No ?Did it involve sudden or severe rash/hives, skin peeling, or any reaction on the inside of your mouth or nose? No ?Did you need to seek medical attention at a hospital or doctor's office? No ?When did it last happen?      Childhood ?If all above answers are ?NO?, may proceed with cephalosporin use.  ? ? ?Outpatient Encounter Medications as of 04/02/2021  ?Medication Sig  ? amLODipine (NORVASC) 5 MG tablet Take 1 tablet (5 mg total) by mouth daily.  ? blood glucose meter kit and supplies Dispense based on patient and insurance preference. Use twice daily as directed. (FOR ICD-10 E10.9, E11.9).  ? carvedilol (COREG) 25 MG tablet Take 1 tablet (25 mg  total) by mouth 2 (two) times daily with a meal.  ? Cholecalciferol (VITAMIN D-3) 125 MCG (5000 UT) TABS Take 1 tablet by mouth daily.  ? COVID-19 mRNA bivalent vaccine, Pfizer, (PFIZER COVID-19 VAC BIVALENT) injection Inject into the muscle.  ? Dulaglutide (TRULICITY) 1.5 KA/7.6OT SOPN Inject 1.5 mg into the skin once a week.  ? escitalopram (LEXAPRO) 5 MG tablet Take 5 mg by mouth daily.  ? FARXIGA 10 MG TABS tablet Take 1 tablet (10 mg total) by mouth daily.  ? glucose blood (ONETOUCH ULTRA) test strip USE TO CHECK BLOOD SUGAR TWICE DAILY AS DIRECTED  ? Lancets (ONETOUCH ULTRASOFT) lancets Use as instructed bid  ? Lancets MISC 1 Device by Does not apply route in the morning, at noon, and at bedtime. Dispense for onetouch ultra 2 machine ONLY  ? LORazepam (ATIVAN) 0.5 MG tablet Take 0.5-1 tablets (0.25-0.5 mg total) by mouth daily as needed for anxiety.  ? montelukast (SINGULAIR) 10 MG tablet Take 1 tablet by mouth once daily  ? Multiple Vitamins-Minerals (CENTRUM SILVER 50+WOMEN PO) Take 1 tablet by mouth daily.  ? rosuvastatin (CRESTOR) 20 MG tablet Take 1 tablet (20 mg total) by mouth daily.  ? sacubitril-valsartan (ENTRESTO) 97-103 MG Take 1 tablet by mouth 2 (two) times daily.  ? sodium bicarbonate 650 MG tablet Take 1 tablet (650 mg total) by mouth 2 (two) times daily.  ? sodium chloride (OCEAN) 0.65 % SOLN nasal spray Place 1 spray into both nostrils as needed for congestion.   ? spironolactone (ALDACTONE) 25 MG tablet Take 1 tablet (25 mg total) by mouth  daily.  ? ?Facility-Administered Encounter Medications as of 04/02/2021  ?Medication  ? albuterol (PROVENTIL) (2.5 MG/3ML) 0.083% nebulizer solution 2.5 mg  ? ? ?Patient Active Problem List  ? Diagnosis Date Noted  ? Skin tag 07/02/2020  ? Seborrheic keratosis 07/02/2020  ? Anxiety and depression 02/19/2020  ? Obstructive uropathy 08/01/2019  ? Gallstones 07/28/2019  ? Umbilical hernia 16/10/9602  ? Hematuria 07/15/2019  ? Hypertension associated with  diabetes ([place]) 07/11/2019  ? Vitamin D deficiency 07/11/2019  ? Hiatal hernia 07/11/2019  ? Obesity (BMI 30.0-34.9) 07/11/2019  ? Heart failure with reduced ejection fraction ([place])   ? GAD (generalized anxiety disorder) 04/06/2019  ? Proteinuria 03/04/2019  ? Aortic atherosclerosis ([place]) 03/04/2019  ? Thyroid nodule 03/04/2019  ? Kidney cysts 03/04/2019  ? Hemangioma of liver 03/04/2019  ? Sinusitis 03/04/2019  ? Kidney stones   ? CKD stage 3 secondary to diabetes ([place]) 08/22/2018  ? Laryngeal spasm 05/07/2018  ? Muscle tension dysphonia 05/07/2018  ? Shortness of breath 01/22/2018  ? Uncontrolled hypertension 01/22/2018  ? Type 2 diabetes mellitus without complication, without long-term current use of insulin ([place]) 01/22/2018  ? Mixed hyperlipidemia 01/22/2018  ? Mild persistent asthma 01/22/2018  ? ? ?Conditions to be addressed/monitored:CHF, HTN, and DMII ? ?Care Plan : Diabetes/Heart Failure/Hypertension  ?Updates made by [name], RN since 04/03/2021 12:00 AM  ?  ? ?Problem: Management of Diabetes/CHF/HTN   ?Priority: Medium  ?  ? ?Long-Range Goal: Management of Diabetes/CHF/HTN   ?Start Date: 12/11/2019  ?Expected End Date: 05/02/2021  ?Recent Progress: On track  ?Priority: Medium  ?Note:   ?Objective:  ?Lab Results  ?Component Value Date  ? HGBA1C 6.5 02/08/2021  ?Current Barriers:  ?Knowledge deficit related to self care management of chronic medical conditions;  monitor blood pressures daily (recent ranges 100-110/70-80's; 118/72), and check blood sugars about twice a day (with fasting ranges 100-110's, 115).  Has been very good with keeping vital sign log. Denies any lower extremity edema and denies any increase in shortness of breath.  Continues to weigh self daily (172).  Continues to have episodes with voice and has been taking Ativan and Baclofen (dose increased) as needed with some relief. Continues to look for therapist for functional movement disorder, researching at web site provided by  specialist at [facility].   Encouraged to continue to look for specialist.  Still needs to learn how to use CPAP) machine.  Provided  patient with pulmonologist number,  Denies any issues or major concerns.  Patient a little discouraged about not finding therapy specialistl continues with her regular therapist/counselor. ?Social issues and knowledge deficits affecting self care management of chronic conditions: Diabetes, Hypertension, Heart Failure  ?Case Manager Clinical Goal(s):  ?Collaboration with McLean-Scocuzza, Nino Glow, MD regarding development and update of comprehensive plan of care as evidenced by provider attestation and co-signature ?Inter-disciplinary care team collaboration (see longitudinal plan of care) ?Over the next 120days, patient will demonstrate improved adherence to prescribed treatment plan for diabetes self care/management as evidenced by: compliance with taking Trulicity-states she has been taking this medication as prescribed ?Encourage lifestyle changes, such as increased intake of plant-based foods, stress reduction, consistent physical activity and medication compliance to prevent long-term complications and chronic disease.   ?Encourage daily monitoring and recording of CBG, blood pressure, and weights ?Discuss importance of adherence to ADA/ carb modified Heart Healthy diet ?Discuss importance of adherence to prescribed medication regimen ?Case Manager Interventions:   ?General ?Reviewed medications with patient and discussed importance of  medication adherence; discussed taking prescribed anxiety medication to help with voice and increase anxiety episodes ?Discussed plans with patient for ongoing care management follow up and provided patient with direct contact information for care management team ?Encouraged to continue to attend therapy/counseling sessions to help with depression ?Discussed using deep breathing exercises when feeling anxious ?Encouraged to keep and attend scheduled  medical appointments ?Congratulated patient on keeping log of vitals for easy provider review ?Emotional Support and empathy provided to patient ?Encouraged to review web site suggested  by [facility] specialist for therapis

## 2021-04-18 ENCOUNTER — Other Ambulatory Visit: Payer: Self-pay | Admitting: Primary Care

## 2021-04-23 ENCOUNTER — Ambulatory Visit (INDEPENDENT_AMBULATORY_CARE_PROVIDER_SITE_OTHER): Payer: Medicare HMO | Admitting: Psychology

## 2021-04-23 DIAGNOSIS — F32A Depression, unspecified: Secondary | ICD-10-CM | POA: Diagnosis not present

## 2021-04-23 DIAGNOSIS — F411 Generalized anxiety disorder: Secondary | ICD-10-CM | POA: Diagnosis not present

## 2021-04-23 DIAGNOSIS — R69 Illness, unspecified: Secondary | ICD-10-CM | POA: Diagnosis not present

## 2021-04-23 NOTE — Progress Notes (Signed)
[place] Counselor/Therapist Progress Note ? ?Patient ID: Athalie Newhard, MRN: [id number]   ? ?Date: 04/23/21 ? ?Time Spent: 2:05 pm - 12:42 pm : 37 Minutes ? ?Treatment Type: Individual Therapy. ? ?Reported Symptoms: Depression and Anxiety. ? ?Mental Status Exam: ?Appearance:  Casual and Well Groomed     ?Behavior: Appropriate  ?Motor: Normal  ?Speech/Language:  Slow  ?Affect: Congruent  ?Mood: depressed  ?Thought process: normal  ?Thought content:   WNL  ?Sensory/Perceptual disturbances:   WNL  ?Orientation: oriented to person, place, time/date, and situation  ?Attention: Good  ?Concentration: Good  ?Memory: WNL  ?Fund of knowledge:  Good  ?Insight:   Good  ?Judgment:  Good  ?Impulse Control: Good  ? ?Risk Assessment: ?Danger to Self:  No ?Self-injurious Behavior: No ?Danger to Others: No ?Duty to Warn:no ?Physical Aggression / Violence:No  ?Access to Firearms a concern: No  ?Gang Involvement:No  ? ?Subjective:  ? ?[name] participated from home, via video, and consented to treatment. Therapist participated from home office. We met online due to [place] pandemic. Silvina reviewed the events of the past week. [name] endorsed "bad feelings about self" and having to deal with her illness. She noted difficulty expressing self dur to her vocal cord dysfunction. We continued to explore this, during the session, and the feelings related to this. She discussed difficulty to find the necessary therapist that would aid In managing her dysfunction. We engaged in problem-solving to identify possible solves for this issue. We worked on identifying ways to engage more socially while accounting for her difficulty to communicating. We identified a solution of writing responses in lieu of communicating verbally. We worked on identifying additional ways to socialize and identified volunteering at the [employer]. [name] was engaged and motivated during the session and provided supportive therapy.   ? ?Interventions:  problem solving and psycho-education ? ?Diagnosis:  ?GAD (generalized anxiety disorder) ? ?Depressive disorder ? ?Treatment Plan: ? ?Client Abilities/Strengths ?Adonna is forthcoming and motivated for change.  ? ?Support System: ?Church  ? ?Client Treatment Preferences ?OPT ? ?Client Statement of Needs ?Talise would like to focus on herself, manage her own symptoms, be more engaged in activity.  ? ?Treatment Level ?Weekly ? ?Symptoms ? ?Anxiety: Decreased consistent worry (COVID, daughter, Terrified of elevators [improved]), muscle tension, decreased panic attacks (2-3x per week), decreased difficulty managing worry, easily fatigued, slightly decreased disturbed sleep (erratic sleep schedule),  Claustrophobia. (Status: maintained) ?Depression:  frequent sadness, improved low motivation, loss of interest, denied any SI.  Shame/embarrassment regarding vocal cord disorder.    (Status: maintained) ? ?Goals:  ? ?Target Date: 05/05/21 Frequency: Weekly  ?Progress: 0 Modality: individual  ? ? ?Therapist will provide referrals for additional resources as appropriate.  ?Therapist will provide psycho-education regarding Adisynn's diagnosis and corresponding treatment approaches and interventions. ?Licensed Clinical Social Worker, Buena Irish, LCSW will support the patient's ability to achieve the goals identified. will employ CBT, BA, Problem-solving, Solution Focused, Mindfulness,  coping skills, & other evidenced-based practices will be used to promote progress towards healthy functioning to help manage decrease symptoms associated with her diagnosis.  ? Reduce overall level, frequency, and intensity of the feelings of depression, anxiety and panic evidenced by decreased overall symptoms from 6 to 7 days/week to 0 to 1 days/week per client report for at least 3 consecutive months. ?Verbally express understanding of the relationship between feelings of depression, anxiety and their impact on  thinking patterns and behaviors. ?Verbalize an understanding of the role that distorted thinking  plays in creating fears, excessive worry, and ruminations. ? ?(Reathel participated in the creation of the treatment plan) ? ? ?Buena Irish, LCSW ? ?

## 2021-04-30 DIAGNOSIS — R195 Other fecal abnormalities: Secondary | ICD-10-CM | POA: Diagnosis not present

## 2021-05-07 ENCOUNTER — Ambulatory Visit (INDEPENDENT_AMBULATORY_CARE_PROVIDER_SITE_OTHER): Payer: Medicare HMO | Admitting: *Deleted

## 2021-05-07 DIAGNOSIS — E119 Type 2 diabetes mellitus without complications: Secondary | ICD-10-CM

## 2021-05-07 DIAGNOSIS — I10 Essential (primary) hypertension: Secondary | ICD-10-CM

## 2021-05-07 NOTE — Patient Instructions (Signed)
Visit Information ? ?Thank you for taking time to visit with me today. Please don't hesitate to contact me if I can be of assistance to you before our next scheduled telephone appointment. ? ?Following are the goals we discussed today:  ?(Copy and paste patient goals from clinical care plan here) ? ?Our next appointment is by telephone on 5/24 at 0930 ? ?Please call the care guide team at [phone] if you need to cancel or reschedule your appointment.  ? ?If you are experiencing a Mental Health or [place] or need someone to talk to, please call the Suicide and Crisis Lifeline: 988 ?call the [place] National Suicide Prevention Lifeline: 236-359-6201 or TTY: 402-229-5506 TTY 704-195-3690) to talk to a trained counselor ?call 1-800-273-TALK (toll free, 24 hour hotline) ?call 911  ? ?Patient verbalizes understanding of instructions and care plan provided today and agrees to view in [place]. Active MyChart status confirmed with patient.   ? ?Hubert Azure RN, MSN ?RN Care Management Coordinator ?[place] ?[phone] ?[email or web address] ? ?

## 2021-05-07 NOTE — Chronic Care Management (AMB) (Signed)
?Chronic Care Management  ? ?CCM RN Visit Note ? ?05/07/2021 ?Name: Nancy Barajas MRN: [id number] DOB: 02/17/1960 ? ?Subjective: ?Nancy Barajas is a 61 y.o. year old female who is a primary care patient of McLean-Scocuzza, Nino Glow, MD. The care management team was consulted for assistance with disease management and care coordination needs.   ? ?Engaged with patient by telephone for follow up visit in response to provider referral for case management and/or care coordination services.  ? ?Consent to Services:  ?The patient was given information about Chronic Care Management services, agreed to services, and gave verbal consent prior to initiation of services.  Please see initial visit note for detailed documentation.  ? ?Patient agreed to services and verbal consent obtained.  ? ?Assessment: Review of patient past medical history, allergies, medications, health status, including review of consultants reports, laboratory and other test data, was performed as part of comprehensive evaluation and provision of chronic care management services.  ? ?SDOH (Social Determinants of Health) assessments and interventions performed:   ? ?CCM Care Plan ? ?Allergies  ?Allergen Reactions  ? Penicillins Rash  ?  Did it involve swelling of the face/tongue/throat, SOB, or low BP? No ?Did it involve sudden or severe rash/hives, skin peeling, or any reaction on the inside of your mouth or nose? No ?Did you need to seek medical attention at a hospital or doctor's office? No ?When did it last happen?      Childhood ?If all above answers are ?NO?, may proceed with cephalosporin use.  ? ? ?Outpatient Encounter Medications as of 05/07/2021  ?Medication Sig  ? amLODipine (NORVASC) 5 MG tablet Take 1 tablet (5 mg total) by mouth daily.  ? blood glucose meter kit and supplies Dispense based on patient and insurance preference. Use twice daily as directed. (FOR ICD-10 E10.9, E11.9).  ? carvedilol (COREG) 25 MG tablet Take 1 tablet (25 mg  total) by mouth 2 (two) times daily with a meal.  ? Cholecalciferol (VITAMIN D-3) 125 MCG (5000 UT) TABS Take 1 tablet by mouth daily.  ? COVID-19 mRNA bivalent vaccine, Pfizer, (PFIZER COVID-19 VAC BIVALENT) injection Inject into the muscle.  ? Dulaglutide (TRULICITY) 1.5 WU/1.3KG SOPN Inject 1.5 mg into the skin once a week.  ? escitalopram (LEXAPRO) 5 MG tablet Take 5 mg by mouth daily.  ? FARXIGA 10 MG TABS tablet Take 1 tablet (10 mg total) by mouth daily.  ? glucose blood (ONETOUCH ULTRA) test strip USE TO CHECK BLOOD SUGAR TWICE DAILY AS DIRECTED  ? Lancets (ONETOUCH ULTRASOFT) lancets Use as instructed bid  ? Lancets MISC 1 Device by Does not apply route in the morning, at noon, and at bedtime. Dispense for onetouch ultra 2 machine ONLY  ? LORazepam (ATIVAN) 0.5 MG tablet Take 0.5-1 tablets (0.25-0.5 mg total) by mouth daily as needed for anxiety.  ? montelukast (SINGULAIR) 10 MG tablet Take 1 tablet by mouth once daily  ? Multiple Vitamins-Minerals (CENTRUM SILVER 50+WOMEN PO) Take 1 tablet by mouth daily.  ? rosuvastatin (CRESTOR) 20 MG tablet Take 1 tablet (20 mg total) by mouth daily.  ? sacubitril-valsartan (ENTRESTO) 97-103 MG Take 1 tablet by mouth 2 (two) times daily.  ? sodium bicarbonate 650 MG tablet Take 1 tablet (650 mg total) by mouth 2 (two) times daily.  ? sodium chloride (OCEAN) 0.65 % SOLN nasal spray Place 1 spray into both nostrils as needed for congestion.   ? spironolactone (ALDACTONE) 25 MG tablet Take 1 tablet (25 mg total) by mouth  daily.  ? ?Facility-Administered Encounter Medications as of 05/07/2021  ?Medication  ? albuterol (PROVENTIL) (2.5 MG/3ML) 0.083% nebulizer solution 2.5 mg  ? ? ?Patient Active Problem List  ? Diagnosis Date Noted  ? Skin tag 07/02/2020  ? Seborrheic keratosis 07/02/2020  ? Anxiety and depression 02/19/2020  ? Obstructive uropathy 08/01/2019  ? Gallstones 07/28/2019  ? Umbilical hernia 84/13/2440  ? Hematuria 07/15/2019  ? Hypertension associated with  diabetes ([place]) 07/11/2019  ? Vitamin D deficiency 07/11/2019  ? Hiatal hernia 07/11/2019  ? Obesity (BMI 30.0-34.9) 07/11/2019  ? Heart failure with reduced ejection fraction ([place])   ? GAD (generalized anxiety disorder) 04/06/2019  ? Proteinuria 03/04/2019  ? Aortic atherosclerosis ([place]) 03/04/2019  ? Thyroid nodule 03/04/2019  ? Kidney cysts 03/04/2019  ? Hemangioma of liver 03/04/2019  ? Sinusitis 03/04/2019  ? Kidney stones   ? CKD stage 3 secondary to diabetes ([place]) 08/22/2018  ? Laryngeal spasm 05/07/2018  ? Muscle tension dysphonia 05/07/2018  ? Shortness of breath 01/22/2018  ? Uncontrolled hypertension 01/22/2018  ? Type 2 diabetes mellitus without complication, without long-term current use of insulin ([place]) 01/22/2018  ? Mixed hyperlipidemia 01/22/2018  ? Mild persistent asthma 01/22/2018  ? ? ?Conditions to be addressed/monitored:CHF, HTN, and DMII ? ?Care Plan : Diabetes/Heart Failure/Hypertension  ?Updates made by [name], RN since 05/07/2021 12:00 AM  ?  ? ?Problem: Management of Diabetes/CHF/HTN   ?Priority: Medium  ?  ? ?Long-Range Goal: Management of Diabetes/CHF/HTN   ?Start Date: 12/11/2019  ?Expected End Date: 05/02/2021  ?Recent Progress: On track  ?Priority: Medium  ?Note:   ?Objective:  ?Lab Results  ?Component Value Date  ? HGBA1C 6.5 02/08/2021  ?Current Barriers:  ?Knowledge deficit related to self care management of chronic medical conditions;  monitor blood pressures daily (recent ranges 110-140/80's, 129/87, 82; 118/72), and check blood sugars about twice a day (with fasting ranges 90-110's, 115).  Has been very good with keeping vital sign log. Denies any lower extremity edema and denies any increase in shortness of breath.  Continues to weigh self daily (174).  Continues to have episodes with voice and has been taking Ativan and Baclofen (dose increased) as needed with some relief. Continues to look for therapist for functional movement disorder, this RNCM contacted [facility]  with patient on line and requested list of in network [place]; LIST MAILED, did receive few names Holland Falling [phone]    [phone] ?DR. Girtha Rm [phone]     DR. MADDISON HENLEY  [phone]    DR Olevia Bowens [phone]  DR AMY JENKINS  [phone]     COPAY $35 IN NETWORK)  Attempted to make appointment message left concerning possible appointment ?Social issues and knowledge deficits affecting self care management of chronic conditions: Diabetes, Hypertension, Heart Failure  ?Case Manager Clinical Goal(s):  ?Collaboration with McLean-Scocuzza, Nino Glow, MD regarding development and update of comprehensive plan of care as evidenced by provider attestation and co-signature ?Inter-disciplinary care team collaboration (see longitudinal plan of care) ?Over the next 120days, patient will demonstrate improved adherence to prescribed treatment plan for diabetes self care/management as evidenced by: compliance with taking Trulicity-states she has been taking this medication as prescribed ?Encourage lifestyle changes, such as increased intake of plant-based foods, stress reduction, consistent physical activity and medication compliance to prevent long-term complications and chronic disease. ?Encourage daily monitoring and recording of CBG, blood pressure, and weights ?Discuss importance of adherence to ADA/ carb modified Heart Healthy diet ?Discuss importance of adherence to  prescribed medication regimen ?Case Manager Interventions:   ?General ?Reviewed medications with patient and discussed importance of medication adherence; discussed taking prescribed anxiety medication to help with voice and increase anxiety episodes ?Discussed plans with patient for ongoing care management follow up and provided patient with direct contact information for care management team ?Encouraged to continue to attend therapy/counseling sessions to help with depression ?Discussed using deep  breathing exercises when feeling anxious ?Emotional Support and empathy provided to patient ?Encouraged to review web site suggested  by [employer] specialist for therapist ?[place] to verify Functional

## 2021-05-11 DIAGNOSIS — E1122 Type 2 diabetes mellitus with diabetic chronic kidney disease: Secondary | ICD-10-CM | POA: Diagnosis not present

## 2021-05-11 DIAGNOSIS — N1831 Chronic kidney disease, stage 3a: Secondary | ICD-10-CM | POA: Diagnosis not present

## 2021-05-20 ENCOUNTER — Ambulatory Visit (INDEPENDENT_AMBULATORY_CARE_PROVIDER_SITE_OTHER): Payer: Medicare HMO | Admitting: Psychology

## 2021-05-20 DIAGNOSIS — F32A Depression, unspecified: Secondary | ICD-10-CM | POA: Diagnosis not present

## 2021-05-20 DIAGNOSIS — R69 Illness, unspecified: Secondary | ICD-10-CM | POA: Diagnosis not present

## 2021-05-20 DIAGNOSIS — F411 Generalized anxiety disorder: Secondary | ICD-10-CM | POA: Diagnosis not present

## 2021-05-20 NOTE — Progress Notes (Signed)
[facility] Counselor/Therapist Progress Note  Patient ID: Nancy Barajas, MRN: [id number]    Date: 05/20/21  Time Spent: 1:36pm - 2:08 pm : 32 Minutes  Treatment Type: Individual Therapy.  Reported Symptoms: Depression and Anxiety.  Mental Status Exam: Appearance:  Casual and Well Groomed     Behavior: Appropriate  Motor: Normal  Speech/Language:  Slow  Affect: Congruent  Mood: depressed  Thought process: normal  Thought content:   WNL  Sensory/Perceptual disturbances:   WNL  Orientation: oriented to person, place, time/date, and situation  Attention: Good  Concentration: Good  Memory: WNL  Fund of knowledge:  Good  Insight:   Good  Judgment:  Good  Impulse Control: Good   Risk Assessment: Danger to Self:  No Self-injurious Behavior: No Danger to Others: No Duty to Warn:no Physical Aggression / Violence:No  Access to Firearms a concern: No  Gang Involvement:No   Subjective:   Nancy Barajas participated from home, via video, and consented to treatment. Therapist participated from home office. We met online due to [place] pandemic. [place] reviewed the events of the past week. Nancy Barajas was referred to numerous specialists for her vocal cord dysfunction. She will follow-up with them for an appointment. She discussed worry about communicating via phone due to VCD. We engaged in problem-solving to address communication concerns. Nancy Barajas noted feeling more depressed while taking her medication, Lexapro, and discussed a need for a change. She discussed her worry about her Lorazepam prescription due to hearing from an acquaintance that it could cause memory problems. Nancy Barajas agreed to a referral for psychiatry to [facility], which will be facilitate by [facility] Staff. We worked on identifying ways to engage in enjoyable activities as Nancy Barajas spends the majority of her time indoors. Therapist employed BA tools to identify an activity, create a schedule,  prepare for engagement, and set reasonable expectations. Nancy Barajas committed to go to a local park for 10-15 minutes 1x-2x per week. Therapist praised Nancy Barajas for her effort during the session. Nancy Barajas was engaged and motivated during the session and provided supportive therapy.   Interventions:  problem solving and BA  Diagnosis:  GAD (generalized anxiety disorder)  Depressive disorder  Treatment Plan:  Client Abilities/Strengths Nancy Barajas is forthcoming and motivated for change.   Support System: [employer] OPT  Client Statement of Needs Nancy Barajas would like to focus on herself, manage her own symptoms, be more engaged in activity.   Treatment Level Weekly  Symptoms  Anxiety: Decreased consistent worry (COVID, daughter, Terrified of elevators [improved]), muscle tension, decreased panic attacks (2-3x per week), decreased difficulty managing worry, easily fatigued, slightly decreased disturbed sleep (erratic sleep schedule),  Claustrophobia. (Status: maintained) Depression:  frequent sadness, improved low motivation, loss of interest, denied any SI.  Shame/embarrassment regarding vocal cord disorder.    (Status: maintained)  Goals:   Target Date: 06/25/21 Frequency: Weekly  Progress: 0 Modality: individual    Therapist will provide referrals for additional resources as appropriate.  Therapist will provide psycho-education regarding Nancy Barajas's diagnosis and corresponding treatment approaches and interventions. Licensed Clinical Social Worker, [place], LCSW will support the patient's ability to achieve the goals identified. will employ CBT, BA, Problem-solving, Solution Focused, Mindfulness,  coping skills, & other evidenced-based practices will be used to promote progress towards healthy functioning to help manage decrease symptoms associated with her diagnosis.   Reduce overall level, frequency, and intensity of the feelings of depression, anxiety and  panic evidenced by decreased overall symptoms from 6 to  7 days/week to 0 to 1 days/week per client report for at least 3 consecutive months. Verbally express understanding of the relationship between feelings of depression, anxiety and their impact on thinking patterns and behaviors. Verbalize an understanding of the role that distorted thinking plays in creating fears, excessive worry, and ruminations.  Nancy Barajas participated in the creation of the treatment plan)   Buena Irish, LCSW

## 2021-05-26 ENCOUNTER — Ambulatory Visit: Payer: Medicare HMO | Admitting: *Deleted

## 2021-05-26 DIAGNOSIS — I10 Essential (primary) hypertension: Secondary | ICD-10-CM

## 2021-05-26 DIAGNOSIS — E119 Type 2 diabetes mellitus without complications: Secondary | ICD-10-CM

## 2021-05-26 NOTE — Chronic Care Management (AMB) (Signed)
Chronic Care Management   CCM RN Visit Note  05/26/2021 Name: Nancy Barajas MRN: [id number] DOB: 07/10/1960  Subjective: Nancy Barajas is a 61 y.o. year old female who is a primary care patient of McLean-Scocuzza, Nino Glow, MD. The care management team was consulted for assistance with disease management and care coordination needs.    Engaged with patient by telephone for follow up visit in response to provider referral for case management and/or care coordination services.   Consent to Services:  The patient was given the following information about Chronic Care Management services today, agreed to services, and gave verbal consent: 1. CCM service includes personalized support from designated clinical staff supervised by the primary care provider, including individualized plan of care and coordination with other care providers 2. 24/7 contact phone numbers for assistance for urgent and routine care needs. 3. Service will only be billed when office clinical staff spend 20 minutes or more in a month to coordinate care. 4. Only one practitioner may furnish and bill the service in a calendar month. 5.The patient may stop CCM services at any time (effective at the end of the month) by phone call to the office staff. 6. The patient will be responsible for cost sharing (co-pay) of up to 20% of the service fee (after annual deductible is met). Patient agreed to services and consent obtained.  Patient agreed to services and verbal consent obtained.   Assessment: Review of patient past medical history, allergies, medications, health status, including review of consultants reports, laboratory and other test data, was performed as part of comprehensive evaluation and provision of chronic care management services.   SDOH (Social Determinants of Health) assessments and interventions performed:    CCM Care Plan  Allergies  Allergen Reactions   Penicillins Rash    Did it involve swelling of the  face/tongue/throat, SOB, or low BP? No Did it involve sudden or severe rash/hives, skin peeling, or any reaction on the inside of your mouth or nose? No Did you need to seek medical attention at a hospital or doctor's office? No When did it last happen?      Childhood If all above answers are "NO", may proceed with cephalosporin use.    Outpatient Encounter Medications as of 05/26/2021  Medication Sig   amLODipine (NORVASC) 5 MG tablet Take 1 tablet (5 mg total) by mouth daily.   carvedilol (COREG) 25 MG tablet Take 1 tablet (25 mg total) by mouth 2 (two) times daily with a meal.   Cholecalciferol (VITAMIN D-3) 125 MCG (5000 UT) TABS Take 1 tablet by mouth daily.   Dulaglutide (TRULICITY) 1.5 KG/8.8PJ SOPN Inject 1.5 mg into the skin once a week.   escitalopram (LEXAPRO) 5 MG tablet Take 5 mg by mouth daily.   FARXIGA 10 MG TABS tablet Take 1 tablet (10 mg total) by mouth daily.   LORazepam (ATIVAN) 0.5 MG tablet Take 0.5-1 tablets (0.25-0.5 mg total) by mouth daily as needed for anxiety.   montelukast (SINGULAIR) 10 MG tablet Take 1 tablet by mouth once daily   Multiple Vitamins-Minerals (CENTRUM SILVER 50+WOMEN PO) Take 1 tablet by mouth daily.   rosuvastatin (CRESTOR) 20 MG tablet Take 1 tablet (20 mg total) by mouth daily.   sacubitril-valsartan (ENTRESTO) 97-103 MG Take 1 tablet by mouth 2 (two) times daily.   sodium bicarbonate 650 MG tablet Take 1 tablet (650 mg total) by mouth 2 (two) times daily.   sodium chloride (OCEAN) 0.65 % SOLN nasal spray Place 1 spray  into both nostrils as needed for congestion.    spironolactone (ALDACTONE) 25 MG tablet Take 1 tablet (25 mg total) by mouth daily.   blood glucose meter kit and supplies Dispense based on patient and insurance preference. Use twice daily as directed. (FOR ICD-10 E10.9, E11.9).   COVID-19 mRNA bivalent vaccine, Pfizer, (PFIZER COVID-19 VAC BIVALENT) injection Inject into the muscle.   glucose blood (ONETOUCH ULTRA) test strip  USE TO CHECK BLOOD SUGAR TWICE DAILY AS DIRECTED   Lancets (ONETOUCH ULTRASOFT) lancets Use as instructed bid   Lancets MISC 1 Device by Does not apply route in the morning, at noon, and at bedtime. Dispense for onetouch ultra 2 machine ONLY   Facility-Administered Encounter Medications as of 05/26/2021  Medication   albuterol (PROVENTIL) (2.5 MG/3ML) 0.083% nebulizer solution 2.5 mg    Patient Active Problem List   Diagnosis Date Noted   Skin tag 07/02/2020   Seborrheic keratosis 07/02/2020   Anxiety and depression 02/19/2020   Obstructive uropathy 08/01/2019   Gallstones 07/86/7544   Umbilical hernia 92/01/69   Hematuria 07/15/2019   Hypertension associated with diabetes ([place]) 07/11/2019   Vitamin D deficiency 07/11/2019   Hiatal hernia 07/11/2019   Obesity (BMI 30.0-34.9) 07/11/2019   Heart failure with reduced ejection fraction (HCC)    GAD (generalized anxiety disorder) 04/06/2019   Proteinuria 03/04/2019   Aortic atherosclerosis ([place]) 03/04/2019   Thyroid nodule 03/04/2019   Kidney cysts 03/04/2019   Hemangioma of liver 03/04/2019   Sinusitis 03/04/2019   Kidney stones    CKD stage 3 secondary to diabetes ([place]) 08/22/2018   Laryngeal spasm 05/07/2018   Muscle tension dysphonia 05/07/2018   Shortness of breath 01/22/2018   Uncontrolled hypertension 01/22/2018   Type 2 diabetes mellitus without complication, without long-term current use of insulin ([place]) 01/22/2018   Mixed hyperlipidemia 01/22/2018   Mild persistent asthma 01/22/2018    Conditions to be addressed/monitored:CHF, HTN, DMII, Anxiety, and Depression  Care Plan : Diabetes/Heart Failure/Hypertension  Updates made by [name], RN since 05/26/2021 12:00 AM     Problem: Management of Diabetes/CHF/HTN   Priority: Medium     Long-Range Goal: Management of Diabetes/CHF/HTN   Start Date: 12/11/2019  Expected End Date: 05/02/2021  Recent Progress: On track  Priority: Medium  Note:   Objective:   Lab Results  Component Value Date   HGBA1C 6.5 02/08/2021  Current Barriers:  Knowledge deficit related to self care management of chronic medical conditions;  monitor blood pressures daily (128/88, 88 118/72), and check blood sugars about twice a day (108).  Has been very good with keeping vital sign log. Denies any lower extremity edema and denies any increase in shortness of breath.  Continues to weigh self daily (174).  Continues to have episodes with voice and has been taking Ativan and Baclofen (dose increased) as needed with some relief. Continues to look for therapist for functional movement disorder, this RNCM contacted several therapist and assisted patient with online application for [email or web address] with amy jenkins csw for possible appointment; still awaiting list from [place] issues and knowledge deficits affecting self care management of chronic conditions: Diabetes, Hypertension, Heart Failure  Case Manager Clinical Goal(s):  Collaboration with McLean-Scocuzza, Nino Glow, MD regarding development and update of comprehensive plan of care as evidenced by provider attestation and co-signature Inter-disciplinary care team collaboration (see longitudinal plan of care) Over the next 120days, patient will demonstrate improved adherence to prescribed treatment plan for diabetes self care/management as evidenced by: compliance with  taking Trulicity-states she has been taking this medication as prescribed Encourage lifestyle changes, such as increased intake of plant-based foods, stress reduction, consistent physical activity and medication compliance to prevent long-term complications and chronic disease. Encourage daily monitoring and recording of CBG, blood pressure, and weights Discuss importance of adherence to ADA/ carb modified Heart Healthy diet Discuss importance of adherence to prescribed medication regimen Case Manager Interventions:   General Reviewed medications  with patient and discussed importance of medication adherence; discussed taking prescribed anxiety medication to help with voice and increase anxiety episodes Discussed plans with patient for ongoing care management follow up and provided patient with direct contact information for care management team Encouraged to continue to attend therapy/counseling sessions to help with depression Discussed using deep breathing exercises when feeling anxious Emotional Support and empathy provided to patient Encouraged to review web site suggested  by [place] specialist for therapist encouraged to schedule appointment with AMY JENKINS Diabetes    Yes progregrssing     Long Term Goal Advised patient, providing education and rationale, to check cbg at least twice a day and record, calling primary care provider or CCM Embedded Pharmacist for findings outside established parameters.   Congratulated patient on current CBG trends and encouraged to review what her goal A1C should be with provider Reviewed current glucose readings and Hgb A1C and congratulated on current readings and discussed ways to keep them within range HTN  Yes progregrssing     Long Term Goal Encouraged to continue to monitor blood pressures daily, and to notify provider for blood pressures elevations or lows CHF    Yes progregrssing     Long Term Goal Continue to monitor weights daily, first thing in the morning when she wakens after voiding and to notify provider for 3 pound weight gain overnight or 5 pounds within 1 week  Encouraged to monitor feet, legs, and abdomen for swelling Patient Goals/Self-Care Activities Over the next 120 days, patient will:  General Contact pulmonology for CPAP instructions Discussed speaking with family /brother to assist with transportation to get CPAP Please contact A local counselor is Barbera Setters, PhD, [facility]. You can use the information below to contact her [address] and [place], [place]  [phone]   [phone] contact pulmonologist for CPAP education schedule appointment with AMY JENKINS Diabetes Check blood sugar at least twice a day (before meals or 2 hours post eating) Check blood sugar if I feel it is too high or too low Enter blood sugar readings and medication into daily log and take log to provider appointments Set target A1C; current A1C decreased to 6.3(07/22) Discuss with PCP goal Hgb A1C  HTN Continue to monitor blood pressures daily, noting if blood pressures elevated if prior to taking medications and to recheck after taking morning medications CHF Continue to monitor weights daily and keeping in log Weigh self first thing in the mornings after voiding Notify provider for increase weight 3 pounds overnight of 5 pounds within 5 days or lower extremity swelling Follow Up Plan: The care management team will reach out to the patient again over the next 20 business days.        Plan:The care management team will reach out to the patient again over the next 20 days. Hubert Azure RN, MSN RN Care Management Coordinator [place] [phone] [email or web address]

## 2021-05-26 NOTE — Patient Instructions (Addendum)
Visit Information  Thank you for taking time to visit with me today. Please don't hesitate to contact me if I can be of assistance to you before our next scheduled telephone appointment.  Following are the goals we discussed today:  General Contact pulmonology for CPAP instructions Discussed speaking with family /brother to assist with transportation to get CPAP Please contact A local counselor is Barbera Setters, PhD, [facility]. You can use the information below to contact her [address] and [place], [place] [phone]   [phone] contact pulmonologist for CPAP education schedule appointment with AMY JENKINS Diabetes Check blood sugar at least twice a day (before meals or 2 hours post eating) Check blood sugar if I feel it is too high or too low Enter blood sugar readings and medication into daily log and take log to provider appointments Set target A1C; current A1C decreased to 6.3(07/22) Discuss with PCP goal Hgb A1C  HTN Continue to monitor blood pressures daily, noting if blood pressures elevated if prior to taking medications and to recheck after taking morning medications CHF Continue to monitor weights daily and keeping in log Weigh self first thing in the mornings after voiding Notify provider for increase weight 3 pounds overnight of 5 pounds within 5 days or lower extremity swelling Follow Up Plan: The care management team will reach out to the patient again over the next 20 busin  Our next appointment is by telephone on 6/9 at 0915  Please call the care guide team at [phone] if you need to cancel or reschedule your appointment.   If you are experiencing a Mental Health or [place] or need someone to talk to, please call the [place] National Suicide Prevention Lifeline: 669-227-8938 or TTY: 917-333-6104 TTY 351 119 5870) to talk to a trained counselor call 1-800-273-TALK (toll free, 24 hour hotline) go to [facility]  Urgent Care [address] [place] [phone])   Patient verbalizes understanding of instructions and care plan provided today and agrees to view in [place]. Active MyChart status and patient understanding of how to access instructions and care plan via MyChart confirmed with patient.     Hubert Azure RN, MSN RN Care Management Coordinator [place] [phone] [email or web address]

## 2021-06-02 DIAGNOSIS — Z7984 Long term (current) use of oral hypoglycemic drugs: Secondary | ICD-10-CM | POA: Diagnosis not present

## 2021-06-02 DIAGNOSIS — E1159 Type 2 diabetes mellitus with other circulatory complications: Secondary | ICD-10-CM | POA: Diagnosis not present

## 2021-06-02 DIAGNOSIS — I110 Hypertensive heart disease with heart failure: Secondary | ICD-10-CM

## 2021-06-02 DIAGNOSIS — I509 Heart failure, unspecified: Secondary | ICD-10-CM | POA: Diagnosis not present

## 2021-06-02 DIAGNOSIS — Z87891 Personal history of nicotine dependence: Secondary | ICD-10-CM | POA: Diagnosis not present

## 2021-06-11 ENCOUNTER — Ambulatory Visit: Payer: Medicare HMO | Admitting: *Deleted

## 2021-06-11 DIAGNOSIS — I10 Essential (primary) hypertension: Secondary | ICD-10-CM

## 2021-06-11 DIAGNOSIS — E119 Type 2 diabetes mellitus without complications: Secondary | ICD-10-CM

## 2021-06-11 NOTE — Chronic Care Management (AMB) (Signed)
  Care Management   Follow Up Note   06/11/2021 Name: Nancy Barajas MRN: [id number] DOB: 07-Apr-1960   Referred by: McLean-Scocuzza, Nino Glow, MD Reason for referral : Chronic Care Management (DM, HTN)   Successful telephone outreach to patient for FOLLOW UP .  Patient driving and requesting call back.  Follow Up Plan: The care management team will reach out to the patient again over the next 30 days.   Hubert Azure RN, MSN RN Care Management Coordinator [place] [phone] [email or web address]

## 2021-06-21 ENCOUNTER — Telehealth: Payer: Self-pay | Admitting: Cardiology

## 2021-06-21 ENCOUNTER — Ambulatory Visit (INDEPENDENT_AMBULATORY_CARE_PROVIDER_SITE_OTHER): Payer: Medicare HMO | Admitting: *Deleted

## 2021-06-21 DIAGNOSIS — I428 Other cardiomyopathies: Secondary | ICD-10-CM

## 2021-06-21 DIAGNOSIS — I10 Essential (primary) hypertension: Secondary | ICD-10-CM

## 2021-06-21 DIAGNOSIS — E119 Type 2 diabetes mellitus without complications: Secondary | ICD-10-CM

## 2021-06-21 MED ORDER — ENTRESTO 97-103 MG PO TABS: 1.0000 | ORAL_TABLET | Freq: Two times a day (BID) | ORAL | 0 refills | Status: DC

## 2021-06-21 NOTE — Telephone Encounter (Signed)
Requested Prescriptions   Signed Prescriptions Disp Refills   sacubitril-valsartan (ENTRESTO) 97-103 MG 180 tablet 0    Sig: Take 1 tablet by mouth 2 (two) times daily.    Authorizing Provider: Kate Sable    Ordering User: Britt Bottom

## 2021-06-21 NOTE — Telephone Encounter (Signed)
*  STAT* If patient is at the pharmacy, call can be transferred to refill team.   1. Which medications need to be refilled? (please list name of each medication and dose if known)  sacubitril-valsartan (ENTRESTO) 97-103 MG  2. Which pharmacy/location (including street and city if local pharmacy) is medication to be sent to?  [facility] (N[facility] [place] - [address]  3. Do they need a 30 day or 90 day supply?   90 days  Patient states she is completely out of the medication now.

## 2021-06-22 NOTE — Chronic Care Management (AMB) (Cosign Needed)
Chronic Care Management   CCM RN Visit Note  06/22/2021 Name: Nancy Barajas MRN: [id number] DOB: 06-Sep-1960  Subjective: Nancy Barajas is a 61 y.o. year old female who is a primary care patient of McLean-Scocuzza, Nino Glow, MD. The care management team was consulted for assistance with disease management and care coordination needs.    Engaged with patient by telephone for follow up visit in response to provider referral for case management and/or care coordination services.   Consent to Services:  The patient was given information about Chronic Care Management services, agreed to services, and gave verbal consent prior to initiation of services.  Please see initial visit note for detailed documentation.   Patient agreed to services and verbal consent obtained.   Assessment: Review of patient past medical history, allergies, medications, health status, including review of consultants reports, laboratory and other test data, was performed as part of comprehensive evaluation and provision of chronic care management services.   SDOH (Social Determinants of Health) assessments and interventions performed:    CCM Care Plan  Allergies  Allergen Reactions   Penicillins Rash    Did it involve swelling of the face/tongue/throat, SOB, or low BP? No Did it involve sudden or severe rash/hives, skin peeling, or any reaction on the inside of your mouth or nose? No Did you need to seek medical attention at a hospital or doctor's office? No When did it last happen?      Childhood If all above answers are "NO", may proceed with cephalosporin use.    Outpatient Encounter Medications as of 06/21/2021  Medication Sig   amLODipine (NORVASC) 5 MG tablet Take 1 tablet (5 mg total) by mouth daily.   blood glucose meter kit and supplies Dispense based on patient and insurance preference. Use twice daily as directed. (FOR ICD-10 E10.9, E11.9).   carvedilol (COREG) 25 MG tablet Take 1 tablet (25 mg  total) by mouth 2 (two) times daily with a meal.   Cholecalciferol (VITAMIN D-3) 125 MCG (5000 UT) TABS Take 1 tablet by mouth daily.   COVID-19 mRNA bivalent vaccine, Pfizer, (PFIZER COVID-19 VAC BIVALENT) injection Inject into the muscle.   Dulaglutide (TRULICITY) 1.5 YD/7.4JO SOPN Inject 1.5 mg into the skin once a week.   escitalopram (LEXAPRO) 5 MG tablet Take 5 mg by mouth daily.   FARXIGA 10 MG TABS tablet Take 1 tablet (10 mg total) by mouth daily.   glucose blood (ONETOUCH ULTRA) test strip USE TO CHECK BLOOD SUGAR TWICE DAILY AS DIRECTED   Lancets (ONETOUCH ULTRASOFT) lancets Use as instructed bid   Lancets MISC 1 Device by Does not apply route in the morning, at noon, and at bedtime. Dispense for onetouch ultra 2 machine ONLY   LORazepam (ATIVAN) 0.5 MG tablet Take 0.5-1 tablets (0.25-0.5 mg total) by mouth daily as needed for anxiety.   montelukast (SINGULAIR) 10 MG tablet Take 1 tablet by mouth once daily   Multiple Vitamins-Minerals (CENTRUM SILVER 50+WOMEN PO) Take 1 tablet by mouth daily.   rosuvastatin (CRESTOR) 20 MG tablet Take 1 tablet (20 mg total) by mouth daily.   sacubitril-valsartan (ENTRESTO) 97-103 MG Take 1 tablet by mouth 2 (two) times daily.   sodium bicarbonate 650 MG tablet Take 1 tablet (650 mg total) by mouth 2 (two) times daily.   sodium chloride (OCEAN) 0.65 % SOLN nasal spray Place 1 spray into both nostrils as needed for congestion.    spironolactone (ALDACTONE) 25 MG tablet Take 1 tablet (25 mg total) by mouth  daily.   Facility-Administered Encounter Medications as of 06/21/2021  Medication   albuterol (PROVENTIL) (2.5 MG/3ML) 0.083% nebulizer solution 2.5 mg    Patient Active Problem List   Diagnosis Date Noted   Skin tag 07/02/2020   Seborrheic keratosis 07/02/2020   Anxiety and depression 02/19/2020   Obstructive uropathy 08/01/2019   Gallstones 20/35/5974   Umbilical hernia 16/38/4536   Hematuria 07/15/2019   Hypertension associated with  diabetes ([place]) 07/11/2019   Vitamin D deficiency 07/11/2019   Hiatal hernia 07/11/2019   Obesity (BMI 30.0-34.9) 07/11/2019   Heart failure with reduced ejection fraction (HCC)    GAD (generalized anxiety disorder) 04/06/2019   Proteinuria 03/04/2019   Aortic atherosclerosis ([place]) 03/04/2019   Thyroid nodule 03/04/2019   Kidney cysts 03/04/2019   Hemangioma of liver 03/04/2019   Sinusitis 03/04/2019   Kidney stones    CKD stage 3 secondary to diabetes ([place]) 08/22/2018   Laryngeal spasm 05/07/2018   Muscle tension dysphonia 05/07/2018   Shortness of breath 01/22/2018   Uncontrolled hypertension 01/22/2018   Type 2 diabetes mellitus without complication, without long-term current use of insulin ([place]) 01/22/2018   Mixed hyperlipidemia 01/22/2018   Mild persistent asthma 01/22/2018    Conditions to be addressed/monitored:CHF, HTN, and DMII  Care Plan : Diabetes/Heart Failure/Hypertension  Updates made by [name], RN since 06/22/2021 12:00 AM     Problem: Management of Diabetes/CHF/HTN   Priority: Medium     Long-Range Goal: Management of Diabetes/CHF/HTN   Start Date: 12/11/2019  Expected End Date: 05/02/2021  Recent Progress: On track  Priority: Medium  Note:   Objective:  Lab Results  Component Value Date   HGBA1C 6.5 02/08/2021  Current Barriers:  Knowledge deficit related to self care management of chronic medical conditions;  monitor blood pressures daily (118/79  120/80'S), and check blood sugars about twice a day (108  100-120's).  Has been very good with keeping vital sign log. Denies any lower extremity edema and denies any increase in shortness of breath.  Continues to weigh self daily ().  Continues to have episodes with voice and has been taking Ativan and Baclofen (dose increased) as needed with some relief. Stopped taking Lexapro stating it wasn't helping.  Continues to look for therapist for functional movement disorder, this RNCM was able to arrange an  appointment with Dr. Arnoldo Morale on July 25th Social issues and knowledge deficits affecting self care management of chronic conditions: Diabetes, Hypertension, Heart Failure   Case Manager Clinical Goal(s):  Collaboration with McLean-Scocuzza, Nino Glow, MD regarding development and update of comprehensive plan of care as evidenced by provider attestation and co-signature Inter-disciplinary care team collaboration (see longitudinal plan of care) Over the next 120days, patient will demonstrate improved adherence to prescribed treatment plan for diabetes self care/management as evidenced by: compliance with taking Trulicity-states she has been taking this medication as prescribed Encourage lifestyle changes, such as increased intake of plant-based foods, stress reduction, consistent physical activity and medication compliance to prevent long-term complications and chronic disease. Encourage daily monitoring and recording of CBG, blood pressure, and weights Discuss importance of adherence to ADA/ carb modified Heart Healthy diet Discuss importance of adherence to prescribed medication regimen Case Manager Interventions:   General Reviewed medications with patient and discussed importance of medication adherence; discussed taking prescribed anxiety medication to help with voice and increase anxiety episodes Discussed plans with patient for ongoing care management follow up and provided patient with direct contact information for care management team Encouraged to continue  to attend therapy/counseling sessions to help with depression Discussed using deep breathing exercises when feeling anxious Emotional Support and empathy provided to patient Encouraged to review web site suggested  by [facility] specialist for therapist attend schedule appointment with [facility] JENKINS Diabetes    Yes progregrssing     Long Term Goal Advised patient, providing education and rationale, to check cbg at least twice a day and record,  calling primary care provider or CCM Embedded Pharmacist for findings outside established parameters.   Congratulated patient on current CBG trends and encouraged to review what her goal A1C should be with provider Reviewed current glucose readings and Hgb A1C and congratulated on current readings and discussed ways to keep them within range HTN  Yes progregrssing     Long Term Goal Encouraged to continue to monitor blood pressures daily, and to notify provider for blood pressures elevations or lows CHF    Yes progregrssing     Long Term Goal Continue to monitor weights daily, first thing in the morning when she wakens after voiding and to notify provider for 3 pound weight gain overnight or 5 pounds within 1 week  Encouraged to monitor feet, legs, and abdomen for swelling Patient Goals/Self-Care Activities Over the next 120 days, patient will:  General Contact pulmonology for CPAP instructions Please contact A local counselor is Barbera Setters, PhD, [facility]. You can use the information below to contact her [address] and [place], [place] [address]   [phone] contact pulmonologist for CPAP education attend appointment with AM[facility] JENKINS Diabetes Check blood sugar at least twice a day (before meals or 2 hours post eating) Check blood sugar if I feel it is too high or too low Enter blood sugar readings and medication into daily log and take log to provider appointments Set target A1C; current A1C decreased to 6.3(07/22) Discuss with PCP goal Hgb A1C  HTN Continue to monitor blood pressures daily, noting if blood pressures elevated if prior to taking medications and to recheck after taking morning medications CHF Continue to monitor weights daily and keeping in log Weigh self first thing in the mornings after voiding Notify provider for increase weight 3 pounds overnight of 5 pounds within 5 days or lower extremity swelling Follow Up Plan: The care management team will reach out  to the patient again over the next 60 business days.        Plan:The care management team will reach out to the patient again over the next 60 days.  Hubert Azure RN, MSN RN Care Management Coordinator [place] [phone] [email or web address]

## 2021-06-22 NOTE — Patient Instructions (Addendum)
Visit Information  Thank you for taking time to visit with me today. Please don't hesitate to contact me if I can be of assistance to you before our next scheduled telephone appointment.  Following are the goals we discussed today:  General Contact pulmonology for CPAP instructions Please contact A local counselor is Barbera Setters, PhD, [facility]. You can use the information below to contact her [address] and [place], [place] [address]   [phone] contact pulmonologist for CPAP education attend appointment with AMY JENKINS Diabetes Check blood sugar at least twice a day (before meals or 2 hours post eating) Check blood sugar if I feel it is too high or too low Enter blood sugar readings and medication into daily log and take log to provider appointments Set target A1C; current A1C decreased to 6.3(07/22) Discuss with PCP goal Hgb A1C  HTN Continue to monitor blood pressures daily, noting if blood pressures elevated if prior to taking medications and to recheck after taking morning medications CHF Continue to monitor weights daily and keeping in log Weigh self first thing in the mornings after voiding Notify provider for increase weight 3 pounds overnight of 5 pounds within 5 days or lower extremity swelling  Our next appointment is by telephone on 7/28 at 1000  Please call the care guide team at [phone] if you need to cancel or reschedule your appointment.   If you are experiencing a Mental Health or [place] or need someone to talk to, please call the Suicide and Crisis Lifeline: 988 call the [place] National Suicide Prevention Lifeline: (567) 512-0322 or TTY: 760-321-3488 TTY (671)375-8181) to talk to a trained counselor call 1-800-273-TALK (toll free, 24 hour hotline) call 911   Patient verbalizes understanding of instructions and care plan provided today and agrees to view in [place]. Active MyChart status and patient understanding of how to access  instructions and care plan via MyChart confirmed with patient.     Hubert Azure RN, MSN RN Care Management Coordinator [place] [phone] [email or web address]

## 2021-06-23 ENCOUNTER — Ambulatory Visit (INDEPENDENT_AMBULATORY_CARE_PROVIDER_SITE_OTHER): Payer: Medicare HMO | Admitting: Psychology

## 2021-06-23 DIAGNOSIS — F32A Depression, unspecified: Secondary | ICD-10-CM | POA: Diagnosis not present

## 2021-06-23 DIAGNOSIS — F411 Generalized anxiety disorder: Secondary | ICD-10-CM

## 2021-06-23 DIAGNOSIS — R69 Illness, unspecified: Secondary | ICD-10-CM | POA: Diagnosis not present

## 2021-06-23 NOTE — Progress Notes (Signed)
[facility] Counselor/Therapist Progress Note  Patient ID: Nancy Barajas, MRN: [id number]    Date: 06/23/21  Time Spent: 1:04 pm - 1:37 pm : 33 Minutes  Treatment Type: Individual Therapy.  Reported Symptoms: Depression and Anxiety.  Mental Status Exam: Appearance:  Casual and Well Groomed     Behavior: Appropriate  Motor: Normal  Speech/Language:  Slow  Affect: Congruent  Mood: normal  Thought process: normal  Thought content:   WNL  Sensory/Perceptual disturbances:   WNL  Orientation: oriented to person, place, time/date, and situation  Attention: Good  Concentration: Good  Memory: WNL  Fund of knowledge:  Good  Insight:   Good  Judgment:  Good  Impulse Control: Good   Risk Assessment: Danger to Self:  No Self-injurious Behavior: No Danger to Others: No Duty to Warn:no Physical Aggression / Violence:No  Access to Firearms a concern: No  Gang Involvement:No   Subjective:   Nancy Barajas participated from home, via video, and consented to treatment. Therapist participated from home office. We met online due to [place] pandemic. [place] reviewed the events of the past week. Nancy Barajas noted some improvement in mood due to discontinuing Lexapro for the past week. She noted feeling less depressed and a positive effect on her vocal cord dysfunction. Nancy Barajas noted being referred to [facility] for therapy on July 25th, 2023 with Blake Divine, LCSW. She noted continued engagement in her prayer line  and noted an upcoming social event with the group, which therapist praised. She noted a need communicate more consistently with her daughter.  She noted a lack of understanding and empathy regarding her vocal cord dysfunction. She noted feeling disconnected from her daughter. We discussed ways to invite her daughter to spend more time. Therapist modeled communication during the session. We discussed ways to inform her daughter about what VCD is. We reviewed  self-care, during the session. Nancy Barajas noted disturbed sleep due to her daughter's work schedule and Nancy Barajas driving her. Therapist praised Nancy Barajas for her effort during the session. Nancy Barajas was engaged and motivated during the session and provided supportive therapy.   Interventions: Interpersonal  Diagnosis:  GAD (generalized anxiety disorder)  Depressive disorder  Treatment Plan:  Client Abilities/Strengths Nancy Barajas is forthcoming and motivated for change.   Support System: [employer] OPT  Client Statement of Needs [place] would like to focus on herself, manage her own symptoms, be more engaged in activity.   Treatment Level Weekly  Symptoms  Anxiety: Decreased consistent worry (COVID, daughter, Terrified of elevators [improved]), muscle tension, decreased panic attacks (2-3x per week), decreased difficulty managing worry, easily fatigued, slightly decreased disturbed sleep (erratic sleep schedule),  Claustrophobia. (Status: maintained) Depression:  frequent sadness, improved low motivation, loss of interest, denied any SI.  Shame/embarrassment regarding vocal cord disorder.    (Status: improved)  Goals:   Target Date: 06/25/21 Frequency: Weekly  Progress: 0 Modality: individual    Therapist will provide referrals for additional resources as appropriate.  Therapist will provide psycho-education regarding Nancy Barajas's diagnosis and corresponding treatment approaches and interventions. Licensed Clinical Social Worker, [place], LCSW will support the patient's ability to achieve the goals identified. will employ CBT, BA, Problem-solving, Solution Focused, Mindfulness,  coping skills, & other evidenced-based practices will be used to promote progress towards healthy functioning to help manage decrease symptoms associated with her diagnosis.   Reduce overall level, frequency, and intensity of the feelings of depression, anxiety and panic evidenced by  decreased overall symptoms from 6 to  7 days/week to 0 to 1 days/week per client report for at least 3 consecutive months. Verbally express understanding of the relationship between feelings of depression, anxiety and their impact on thinking patterns and behaviors. Verbalize an understanding of the role that distorted thinking plays in creating fears, excessive worry, and ruminations.  Nancy Barajas participated in the creation of the treatment plan)   Buena Irish, LCSW

## 2021-07-02 DIAGNOSIS — I110 Hypertensive heart disease with heart failure: Secondary | ICD-10-CM | POA: Diagnosis not present

## 2021-07-02 DIAGNOSIS — E1159 Type 2 diabetes mellitus with other circulatory complications: Secondary | ICD-10-CM | POA: Diagnosis not present

## 2021-07-02 DIAGNOSIS — I509 Heart failure, unspecified: Secondary | ICD-10-CM | POA: Diagnosis not present

## 2021-07-02 DIAGNOSIS — Z7984 Long term (current) use of oral hypoglycemic drugs: Secondary | ICD-10-CM | POA: Diagnosis not present

## 2021-07-05 ENCOUNTER — Other Ambulatory Visit: Payer: Self-pay | Admitting: Internal Medicine

## 2021-07-05 DIAGNOSIS — F419 Anxiety disorder, unspecified: Secondary | ICD-10-CM

## 2021-07-05 DIAGNOSIS — R498 Other voice and resonance disorders: Secondary | ICD-10-CM

## 2021-07-08 IMAGING — MG MM DIGITAL SCREENING BILAT W/ TOMO AND CAD
6 of 10 series · 6 of 30 positions shown · non-contrast
Comparison: Previous exam(s).

CLINICAL DATA: Screening.

EXAM:
DIGITAL SCREENING BILATERAL MAMMOGRAM WITH TOMOSYNTHESIS AND CAD
TECHNIQUE: Bilateral screening digital craniocaudal and mediolateral oblique
mammograms were obtained. Bilateral screening digital breast
tomosynthesis was performed. The images were evaluated with
computer-aided detection.

[L MLO synth-2D (1 of 2)]
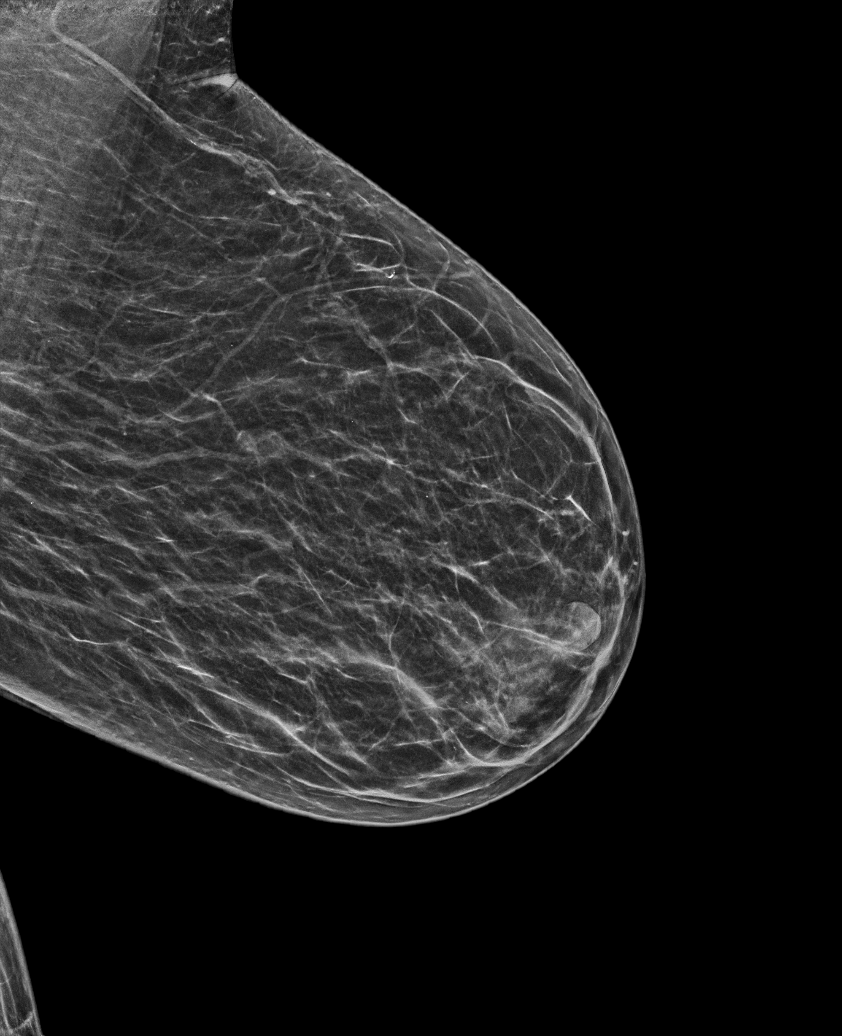

[R MLO synth-2D]
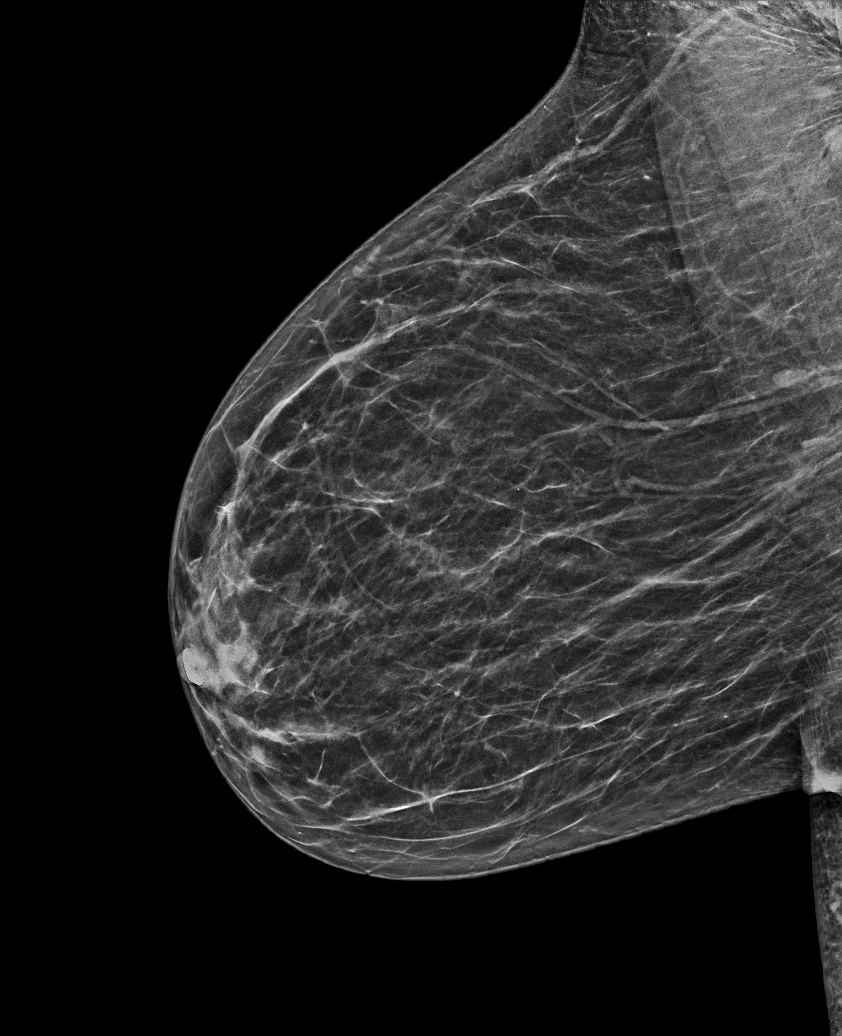

[L CC synth-2D]
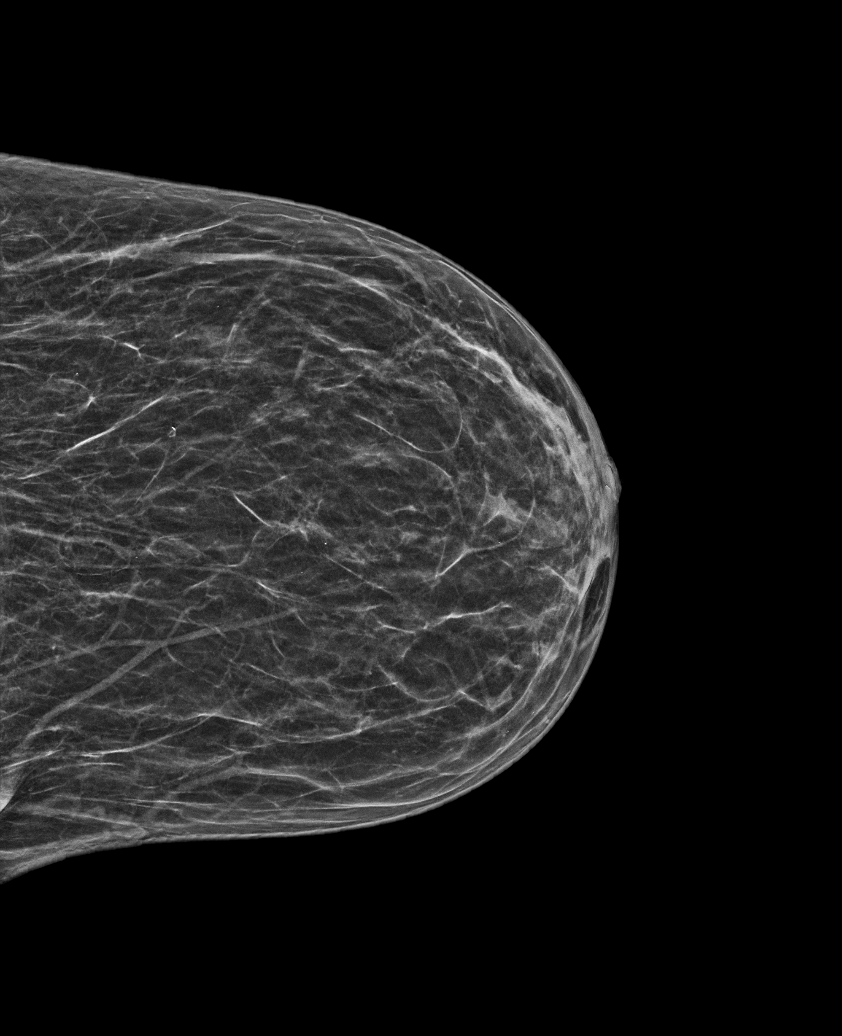

[L MLO synth-2D (2 of 2)]
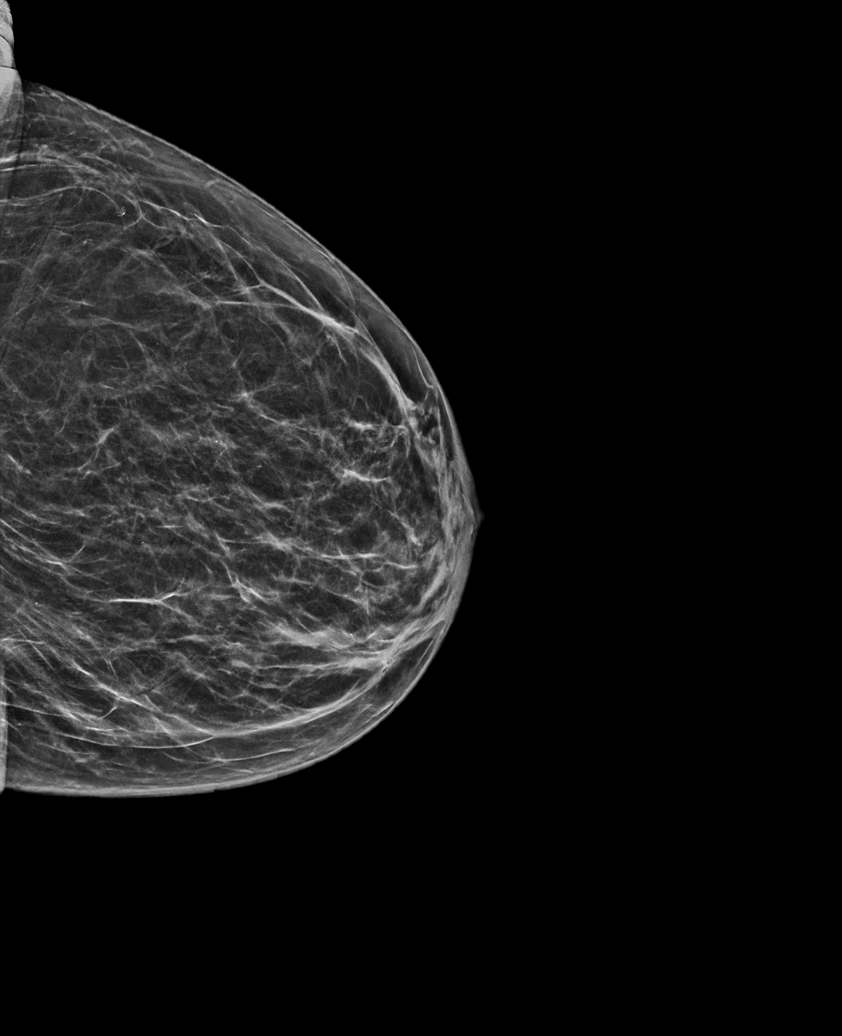

[R CC synth-2D]
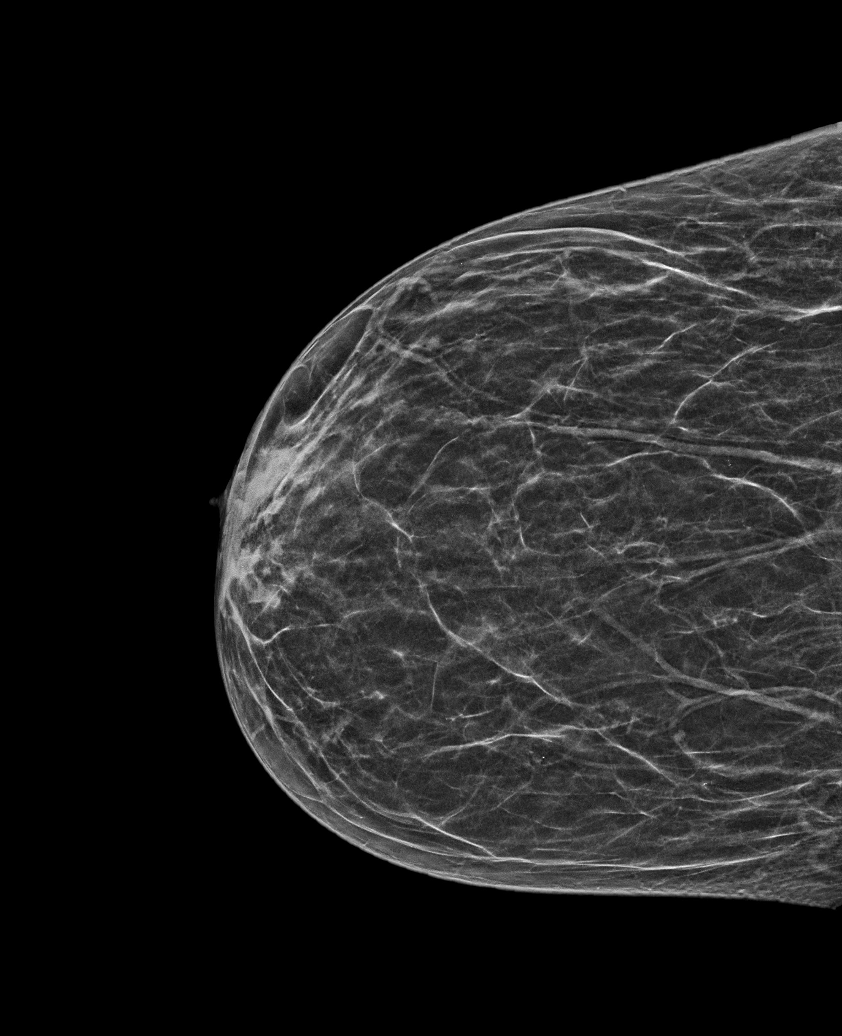

[R CC tomo · tomo slice 25/49.0]
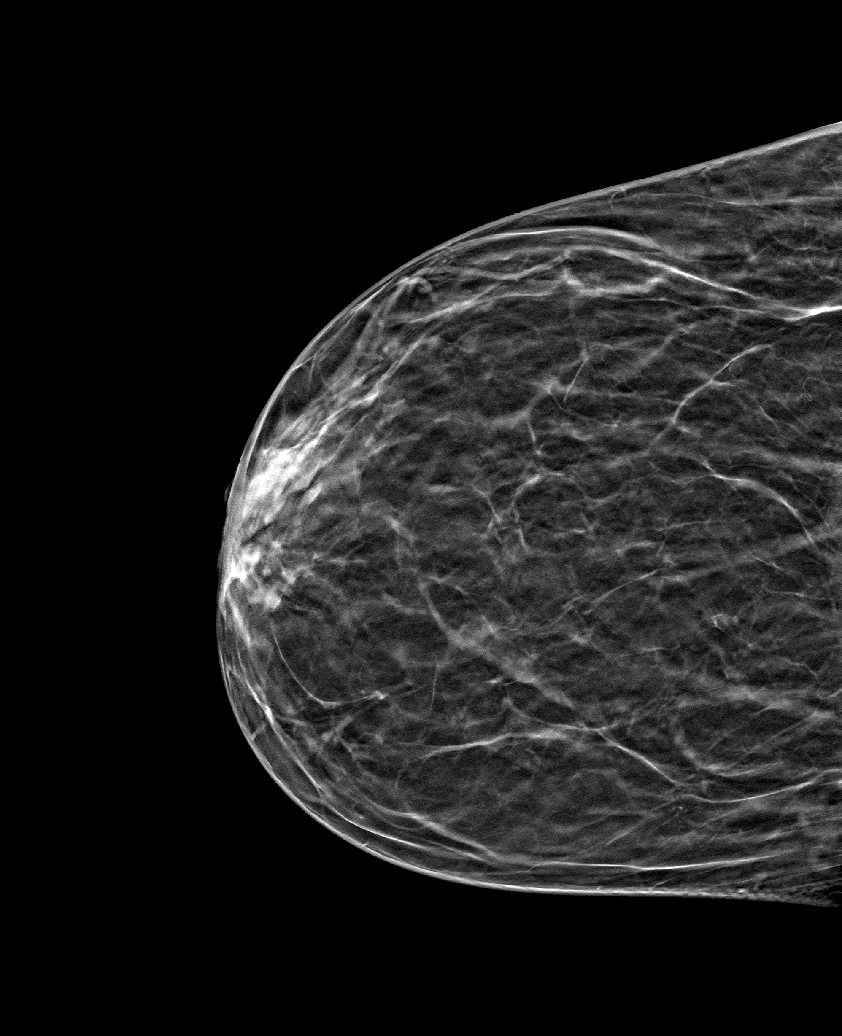

[6 of 30 positions shown; findings below may reference images not displayed]

ACR Breast Density Category b: There are scattered areas of
fibroglandular density.
FINDINGS: There are no findings suspicious for malignancy. The images were
evaluated with computer-aided detection.
IMPRESSION: No mammographic evidence of malignancy. A result letter of this
screening mammogram will be mailed directly to the patient.

RECOMMENDATION:
Screening mammogram in one year. (Code:[id number])

BI-RADS CATEGORY  1: Negative.

## 2021-07-27 DIAGNOSIS — F419 Anxiety disorder, unspecified: Secondary | ICD-10-CM | POA: Diagnosis not present

## 2021-07-27 DIAGNOSIS — R69 Illness, unspecified: Secondary | ICD-10-CM | POA: Diagnosis not present

## 2021-07-27 DIAGNOSIS — F331 Major depressive disorder, recurrent, moderate: Secondary | ICD-10-CM | POA: Diagnosis not present
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
## 2021-07-30 ENCOUNTER — Ambulatory Visit (INDEPENDENT_AMBULATORY_CARE_PROVIDER_SITE_OTHER): Payer: Medicare HMO | Admitting: *Deleted

## 2021-07-30 DIAGNOSIS — E119 Type 2 diabetes mellitus without complications: Secondary | ICD-10-CM

## 2021-07-30 DIAGNOSIS — R498 Other voice and resonance disorders: Secondary | ICD-10-CM

## 2021-07-30 DIAGNOSIS — I10 Essential (primary) hypertension: Secondary | ICD-10-CM

## 2021-07-30 NOTE — Patient Instructions (Signed)
CONGRATULATIONS ON COMPLETING YOUR GOALS.  IT AS BEEN A PLEASURE WORKING WITH AND TALKING TO YOU.  IF  NEEDS [place] IN THE FUTURE PLEASE DO NOT HESITATE TO CONTACT ME  [phone]  Javante Nilsson RN, MSN RN Care Management Coordinator [facility] [phone] [email or web address]  

## 2021-07-30 NOTE — Chronic Care Management (AMB) (Signed)
  Care Management   Follow Up Note   07/30/2021 Name: Nancy Barajas MRN: [id number] DOB: 20-Sep-1960   Referred by: McLean-Scocuzza, Nino Glow, MD Reason for referral : Case Closure   Successful outreach to patient.  States she is doing well without complaints.  Discussed goals and both agree patient has met goals of the program.  Follow Up Plan:  The patient has been provided with contact information for the care management team and has been advised to call with any health-related questions or concerns.  No further follow up required: as personal goals have been met.  Hubert Azure RN, MSN RN Care Management Coordinator [place] [phone] [email or web address]

## 2021-08-02 ENCOUNTER — Ambulatory Visit (INDEPENDENT_AMBULATORY_CARE_PROVIDER_SITE_OTHER): Payer: Medicare HMO | Admitting: Psychology

## 2021-08-02 ENCOUNTER — Ambulatory Visit: Payer: Medicare HMO | Admitting: Psychiatry

## 2021-08-02 ENCOUNTER — Encounter: Payer: Self-pay | Admitting: Psychiatry

## 2021-08-02 VITALS — BP 127/79 | HR 92 | Temp 97.6°F | Wt 183.4 lb

## 2021-08-02 DIAGNOSIS — F959 Tic disorder, unspecified: Secondary | ICD-10-CM | POA: Diagnosis not present

## 2021-08-02 DIAGNOSIS — I10 Essential (primary) hypertension: Secondary | ICD-10-CM | POA: Diagnosis not present

## 2021-08-02 DIAGNOSIS — F4310 Post-traumatic stress disorder, unspecified: Secondary | ICD-10-CM | POA: Diagnosis not present

## 2021-08-02 DIAGNOSIS — F331 Major depressive disorder, recurrent, moderate: Secondary | ICD-10-CM | POA: Diagnosis not present

## 2021-08-02 DIAGNOSIS — E119 Type 2 diabetes mellitus without complications: Secondary | ICD-10-CM | POA: Diagnosis not present

## 2021-08-02 DIAGNOSIS — R69 Illness, unspecified: Secondary | ICD-10-CM | POA: Diagnosis not present

## 2021-08-02 MED ORDER — DEUTETRABENAZINE 6 MG PO TABS: 6.0000 mg | ORAL_TABLET | Freq: Every day | ORAL | 0 refills | Status: DC

## 2021-08-02 NOTE — Progress Notes (Signed)
[facility] Counselor/Therapist Progress Note  Patient ID: Nancy Barajas, MRN: [id number]    Date: 08/02/21  Time Spent: 4:34 pm - 5:14 pm : 40 Minutes  Treatment Type: Individual Therapy.  Reported Symptoms: Depression and Anxiety.  Mental Status Exam: Appearance:  Casual and Well Groomed     Behavior: Appropriate  Motor: Normal  Speech/Language:  Slow  Affect: Congruent  Mood: normal  Thought process: normal  Thought content:   WNL  Sensory/Perceptual disturbances:   WNL  Orientation: oriented to person, place, time/date, and situation  Attention: Good  Concentration: Good  Memory: WNL  Fund of knowledge:  Good  Insight:   Good  Judgment:  Good  Impulse Control: Good   Risk Assessment: Danger to Self:  No Self-injurious Behavior: No Danger to Others: No Duty to Warn:no Physical Aggression / Violence:No  Access to Firearms a concern: No  Gang Involvement:No   Subjective:   Nancy Barajas participated from home, via video, and consented to treatment. Therapist participated from office. We met online due to [place] pandemic. Nancy Barajas reviewed the events of the past week. Nancy Barajas  was prescribed Deutetrabenazine 98m. She is slated to see her psychiatrist next month for a follow-up. She has not began the medication yet as it is in process. She noted worry that she might develop a dependence on her lorazepam but noted taking her medication consistently. Therapist employed CBT framework during the session to process this, and identify corresponding thoughts and behaviors.Therapist praised Nancy Pulsefor her effort during the session. Nancy Pulsewas engaged and motivated during the session and provided supportive therapy.   Interventions: Cognitive Behavioral Therapy  Diagnosis:  PTSD (post-traumatic stress disorder)  MDD (major depressive disorder), recurrent episode, moderate ([place]  Treatment Plan:  Client Abilities/Strengths KMarceleis forthcoming and  motivated for change.   Support System: [facility]  Client Statement of Needs KAmelwould like to focus on herself, manage her own symptoms, be more engaged in activity.   Treatment Level Weekly  Symptoms  Anxiety: Decreased consistent worry (COVID, daughter, Terrified of elevators [improved]), muscle tension, decreased panic attacks (2-3x per week), decreased difficulty managing worry, easily fatigued, slightly decreased disturbed sleep (erratic sleep schedule),  Claustrophobia. (Status: maintained) Depression:  frequent sadness, improved low motivation, loss of interest, denied any SI.  Shame/embarrassment regarding vocal cord disorder.    (Status: improved)  Goals:   Target Date:09/25/21 Frequency: Weekly  Progress: 0 Modality: individual    Therapist will provide referrals for additional resources as appropriate.  Therapist will provide psycho-education regarding Nancy Barajas's diagnosis and corresponding treatment approaches and interventions. Licensed Clinical Social Worker, OC[place]co LCSW will support the patient's ability to achieve the goals identified. will employ CBT, BA, Problem-solving, Solution Focused, Mindfulness,  coping skills, & other evidenced-based practices will be used to promote progress towards healthy functioning to help manage decrease symptoms associated with her diagnosis.   Reduce overall level, frequency, and intensity of the feelings of depression, anxiety and panic evidenced by decreased overall symptoms from 6 to 7 days/week to 0 to 1 days/week per client report for at least 3 consecutive months. Verbally express understanding of the relationship between feelings of depression, anxiety and their impact on thinking patterns and behaviors. Verbalize an understanding of the role that distorted thinking plays in creating fears, excessive worry, and ruminations.  (Nancy Barajas in the creation of the treatment  plan)   OBuena Irish LCSW

## 2021-08-04 ENCOUNTER — Telehealth: Payer: Self-pay

## 2021-08-04 NOTE — Telephone Encounter (Signed)
received a notice that patient needed prior auth deutetrabenazine '6mg'$ .

## 2021-08-04 NOTE — Telephone Encounter (Signed)
went online and submitted the prior auth - pending ?

## 2021-08-05 DIAGNOSIS — G259 Extrapyramidal and movement disorder, unspecified: Secondary | ICD-10-CM | POA: Diagnosis not present

## 2021-08-05 DIAGNOSIS — J383 Other diseases of vocal cords: Secondary | ICD-10-CM | POA: Diagnosis not present

## 2021-08-05 DIAGNOSIS — R69 Illness, unspecified: Secondary | ICD-10-CM | POA: Diagnosis not present

## 2021-08-09 ENCOUNTER — Other Ambulatory Visit: Payer: Self-pay | Admitting: Internal Medicine

## 2021-08-09 DIAGNOSIS — F419 Anxiety disorder, unspecified: Secondary | ICD-10-CM

## 2021-08-09 DIAGNOSIS — R498 Other voice and resonance disorders: Secondary | ICD-10-CM

## 2021-08-09 NOTE — Telephone Encounter (Signed)
austeduo was denied.

## 2021-08-09 NOTE — Telephone Encounter (Signed)
Noted. Please find out if they can cover tetrabenazine or valbenazine. Thanks.

## 2021-08-09 NOTE — Telephone Encounter (Signed)
If psychiatry and manage anxiety  I am PCP Thank you

## 2021-08-09 NOTE — Telephone Encounter (Signed)
prior auth for austedo appeal was not approved.

## 2021-08-10 DIAGNOSIS — F419 Anxiety disorder, unspecified: Secondary | ICD-10-CM | POA: Diagnosis not present

## 2021-08-10 DIAGNOSIS — F331 Major depressive disorder, recurrent, moderate: Secondary | ICD-10-CM | POA: Diagnosis not present

## 2021-08-10 DIAGNOSIS — R69 Illness, unspecified: Secondary | ICD-10-CM | POA: Diagnosis not present

## 2021-08-24 ENCOUNTER — Encounter: Payer: Self-pay | Admitting: Internal Medicine

## 2021-08-24 ENCOUNTER — Ambulatory Visit (INDEPENDENT_AMBULATORY_CARE_PROVIDER_SITE_OTHER): Payer: Medicare HMO | Admitting: Internal Medicine

## 2021-08-24 VITALS — BP 134/76 | HR 94 | Temp 98.4°F | Ht 70.0 in | Wt 182.4 lb

## 2021-08-24 DIAGNOSIS — I700 Atherosclerosis of aorta: Secondary | ICD-10-CM

## 2021-08-24 DIAGNOSIS — E559 Vitamin D deficiency, unspecified: Secondary | ICD-10-CM | POA: Diagnosis not present

## 2021-08-24 DIAGNOSIS — Z23 Encounter for immunization: Secondary | ICD-10-CM

## 2021-08-24 DIAGNOSIS — E1159 Type 2 diabetes mellitus with other circulatory complications: Secondary | ICD-10-CM

## 2021-08-24 DIAGNOSIS — R490 Dysphonia: Secondary | ICD-10-CM

## 2021-08-24 DIAGNOSIS — I428 Other cardiomyopathies: Secondary | ICD-10-CM

## 2021-08-24 DIAGNOSIS — J385 Laryngeal spasm: Secondary | ICD-10-CM

## 2021-08-24 DIAGNOSIS — I10 Essential (primary) hypertension: Secondary | ICD-10-CM

## 2021-08-24 DIAGNOSIS — E119 Type 2 diabetes mellitus without complications: Secondary | ICD-10-CM

## 2021-08-24 MED ORDER — MONTELUKAST SODIUM 10 MG PO TABS: 10.0000 mg | ORAL_TABLET | Freq: Every day | ORAL | 3 refills | Status: DC

## 2021-08-24 MED ORDER — SPIRONOLACTONE 25 MG PO TABS: 25.0000 mg | ORAL_TABLET | Freq: Every day | ORAL | 3 refills | Status: DC

## 2021-08-24 MED ORDER — TRULICITY 1.5 MG/0.5ML ~~LOC~~ SOAJ: 1.5000 mg | SUBCUTANEOUS | 5 refills | Status: DC

## 2021-08-24 MED ORDER — FARXIGA 10 MG PO TABS: 10.0000 mg | ORAL_TABLET | Freq: Every day | ORAL | 3 refills | Status: DC

## 2021-08-24 MED ORDER — ONETOUCH ULTRASOFT LANCETS MISC: 3 refills | Status: AC

## 2021-08-24 MED ORDER — ROSUVASTATIN CALCIUM 20 MG PO TABS: 20.0000 mg | ORAL_TABLET | Freq: Every day | ORAL | 3 refills | Status: DC

## 2021-08-24 MED ORDER — CARVEDILOL 25 MG PO TABS: 25.0000 mg | ORAL_TABLET | Freq: Two times a day (BID) | ORAL | 3 refills | Status: DC

## 2021-08-24 MED ORDER — SHINGRIX 50 MCG/0.5ML IM SUSR: 0.5000 mL | Freq: Once | INTRAMUSCULAR | 1 refills | Status: AC

## 2021-08-24 MED ORDER — ONETOUCH ULTRA VI STRP: ORAL_STRIP | 3 refills | Status: DC

## 2021-08-24 MED ORDER — AMLODIPINE BESYLATE 5 MG PO TABS: 5.0000 mg | ORAL_TABLET | Freq: Every day | ORAL | 3 refills | Status: DC

## 2021-08-24 NOTE — Progress Notes (Unsigned)
Chief Complaint  Patient presents with   Follow-up    6 month f/u   HPI ROS Past Medical History:  Diagnosis Date   Anxiety    Asthma    Depression    Diabetes mellitus without complication ([place])    History of kidney stones    Hypertension    Kidney stones    Vertigo    when laying flat   Vocal cord dysfunction    makes it hard for patient to talk and breathe   Past Surgical History:  Procedure Laterality Date   CARDIAC CATHETERIZATION     CYSTOSCOPY W/ RETROGRADES Right 09/27/2019   Procedure: CYSTOSCOPY WITH RETROGRADE PYELOGRAM;  Surgeon: Billey Co, MD;  Location: [facility];  Service: Urology;  Laterality: Right;   CYSTOSCOPY/URETEROSCOPY/HOLMIUM LASER/STENT PLACEMENT Left 09/27/2019   Procedure: CYSTOSCOPY/URETEROSCOPY/HOLMIUM LASER/STENT PLACEMENT;  Surgeon: Billey Co, MD;  Location: [facility];  Service: Urology;  Laterality: Left;   LITHOTRIPSY     RIGHT/LEFT HEART CATH AND CORONARY ANGIOGRAPHY Bilateral 04/08/2019   Procedure: RIGHT/LEFT HEART CATH AND CORONARY ANGIOGRAPHY;  Surgeon: Wellington Hampshire, MD;  Location: [facility];  Service: Cardiovascular;  Laterality: Bilateral;   Family History  Problem Relation Age of Onset   Hypertension Mother    Diabetes Mother    Hypertension Father    Diabetes Father    Breast cancer Neg Hx    Social History   Socioeconomic History   Marital status: Divorced    Spouse name: Not on file   Number of children: 2   Years of education: Not on file   Highest education level: 9th grade  Occupational History   Not on file  Tobacco Use   Smoking status: Former    Packs/day: 1.00    Years: 10.00    Total pack years: 10.00    Types: Cigarettes    Quit date: 01/03/1998    Years since quitting: 23.6   Smokeless tobacco: Never  Vaping Use   Vaping Use: Never used  Substance and Sexual Activity   Alcohol use: Not Currently   Drug use: Not Currently    Types: Marijuana    Comment: years ago   Sexual  activity: Not Currently  Other Topics Concern   Not on file  Social History Narrative   Lives at home    2 daughters as of 04/04/19 87 y.o daughter lives with her (she works)   Patient is on disability.   Social Determinants of Health   Financial Resource Strain: Low Risk  (01/06/2021)   Overall Financial Resource Strain (CARDIA)    Difficulty of Paying Living Expenses: Not hard at all  Food Insecurity: Food Insecurity Present (02/03/2020)   Hunger Vital Sign    Worried About Running Out of Food in the Last Year: Sometimes true    Ran Out of Food in the Last Year: Sometimes true  Transportation Needs: No Transportation Needs (02/15/2021)   PRAPARE - Hydrologist (Medical): No    Lack of Transportation (Non-Medical): No  Physical Activity: Unknown (01/15/2019)   Exercise Vital Sign    Days of Exercise per Week: 0 days    Minutes of Exercise per Session: Not on file  Stress: Stress Concern Present (03/19/2020)   [place]    Feeling of Stress : To some extent  Social Connections: Unknown (02/15/2021)   Social Connection and Isolation Panel [NHANES]    Frequency of  Communication with Friends and Family: Three times a week    Frequency of Social Gatherings with Friends and Family: More than three times a week    Attends Religious Services: Never    Marine scientist or Organizations: Yes    Attends Archivist Meetings: Never    Marital Status: Not on file  Intimate Partner Violence: Not At Risk (02/15/2021)   Humiliation, Afraid, Rape, and Kick questionnaire    Fear of Current or Ex-Partner: No    Emotionally Abused: No    Physically Abused: No    Sexually Abused: No   Current Meds  Medication Sig   amLODipine (NORVASC) 5 MG tablet Take 1 tablet (5 mg total) by mouth daily.   blood glucose meter kit and supplies Dispense based on patient and insurance preference. Use twice  daily as directed. (FOR ICD-10 E10.9, E11.9).   carvedilol (COREG) 25 MG tablet Take 1 tablet (25 mg total) by mouth 2 (two) times daily with a meal.   Cholecalciferol (VITAMIN D-3) 125 MCG (5000 UT) TABS Take 1 tablet by mouth daily.   COVID-19 mRNA bivalent vaccine, Pfizer, (PFIZER COVID-19 VAC BIVALENT) injection Inject into the muscle.   Deutetrabenazine 6 MG TABS Take 6 mg by mouth daily.   Dulaglutide (TRULICITY) 1.5 YW/7.3XT SOPN Inject 1.5 mg into the skin once a week.   FARXIGA 10 MG TABS tablet Take 1 tablet (10 mg total) by mouth daily.   glucose blood (ONETOUCH ULTRA) test strip USE TO CHECK BLOOD SUGAR TWICE DAILY AS DIRECTED   Lancets (ONETOUCH ULTRASOFT) lancets Use as instructed bid   Lancets MISC 1 Device by Does not apply route in the morning, at noon, and at bedtime. Dispense for onetouch ultra 2 machine ONLY   LORazepam (ATIVAN) 0.5 MG tablet TAKE ONE-HALF TO ONE TABLET BY MOUTH ONCE DAILY AS NEEDED FOR ANXIETY   montelukast (SINGULAIR) 10 MG tablet Take 1 tablet by mouth once daily   Multiple Vitamins-Minerals (CENTRUM SILVER 50+WOMEN PO) Take 1 tablet by mouth daily.   rosuvastatin (CRESTOR) 20 MG tablet Take 1 tablet (20 mg total) by mouth daily.   sacubitril-valsartan (ENTRESTO) 97-103 MG Take 1 tablet by mouth 2 (two) times daily.   sodium bicarbonate 650 MG tablet Take 1 tablet (650 mg total) by mouth 2 (two) times daily.   sodium chloride (OCEAN) 0.65 % SOLN nasal spray Place 1 spray into both nostrils as needed for congestion.    spironolactone (ALDACTONE) 25 MG tablet Take 1 tablet (25 mg total) by mouth daily.   Allergies  Allergen Reactions   Penicillins Rash    Did it involve swelling of the face/tongue/throat, SOB, or low BP? No Did it involve sudden or severe rash/hives, skin peeling, or any reaction on the inside of your mouth or nose? No Did you need to seek medical attention at a hospital or doctor's office? No When did it last happen?      Childhood If  all above answers are "NO", may proceed with cephalosporin use.   No results found for this or any previous visit (from the past 2160 hour(s)). Objective  Body mass index is 26.17 kg/m. Wt Readings from Last 3 Encounters:  08/24/21 182 lb 6.4 oz (82.7 kg)  02/15/21 181 lb (82.1 kg)  02/11/21 181 lb 9.6 oz (82.4 kg)   Temp Readings from Last 3 Encounters:  08/24/21 98.4 F (36.9 C) (Oral)  02/11/21 98.5 F (36.9 C) (Oral)  08/19/20 98.1 F (36.7 C) (  Oral)   BP Readings from Last 3 Encounters:  08/24/21 134/76  02/15/21 120/79  02/11/21 128/78   Pulse Readings from Last 3 Encounters:  08/24/21 94  02/15/21 75  02/11/21 85    Physical Exam  Assessment  Plan  No diagnosis found.   HM Flu utd given utd  Tdap 02/11/21 prev  shingrix consider given Rx 07/02/20 pna 23 vaccine utd  covid 4/4    mammo ordered 03/2020 negative ordered   Pap 02/05/2018 negative DUE   Colonoscopy never had due to anxiety will do cologaurd ordered +07/29/19 + Referred colonoscopy [facility] today sch 10/2021   Urology Dr. Diamantina [facility] had kidney stone surgery 10/09/19 and stent removal 10/16/19 [facility] thyroid bx 10/16/19 bx negative [facility]    Provider: Dr. Olivia Mackie McLean-Scocuz[facility]

## 2021-08-24 NOTE — Patient Instructions (Addendum)
Goal weight 170-173 lbs   Pap smear due now no later than 02/06/2023   Call and schedule mammogram  Colonoscopy scheduled 10/2021   Call insurance about eye doctor ophthalmologist to see if one is cheaper   Pneumococcal Conjugate Vaccine (Prevnar 20) Suspension for Injection What is this medication? PNEUMOCOCCAL VACCINE (NEU mo KOK al vak SEEN) is a vaccine. It prevents pneumococcus bacterial infections. These bacteria can cause serious infections like pneumonia, meningitis, and blood infections. This vaccine will not treat an infection and will not cause infection. This vaccine is recommended for adults 18 years and older. This medicine may be used for other purposes; ask your health care provider or pharmacist if you have questions. COMMON BRAND NAME(S): Prevnar 20 What should I tell my care team before I take this medication? They need to know if you have any of these conditions: bleeding disorder fever immune system problems an unusual or allergic reaction to pneumococcal vaccine, diphtheria toxoid, other vaccines, other medicines, foods, dyes, or preservatives pregnant or trying to get pregnant breast-feeding How should I use this medication? This vaccine is injected into a muscle. It is given by a health care provider. A copy of Vaccine Information Statements will be given before each vaccination. Be sure to read this information carefully each time. This sheet may change often. Talk to your health care provider about the use of this medicine in children. Special care may be needed. Overdosage: If you think you have taken too much of this medicine contact a poison control center or emergency room at once. NOTE: This medicine is only for you. Do not share this medicine with others. What if I miss a dose? This does not apply. This medicine is not for regular use. What may interact with this medication? medicines for cancer chemotherapy medicines that suppress your immune  function steroid medicines like prednisone or cortisone This list may not describe all possible interactions. Give your health care provider a list of all the medicines, herbs, non-prescription drugs, or dietary supplements you use. Also tell them if you smoke, drink alcohol, or use illegal drugs. Some items may interact with your medicine. What should I watch for while using this medication? Mild fever and pain should go away in 3 days or less. Report any unusual symptoms to your health care provider. What side effects may I notice from receiving this medication? Side effects that you should report to your doctor or health care professional as soon as possible: allergic reactions (skin rash, itching or hives; swelling of the face, lips, or tongue) confusion fast, irregular heartbeat fever over 102 degrees F muscle weakness seizures trouble breathing unusual bruising or bleeding Side effects that usually do not require medical attention (report to your doctor or health care professional if they continue or are bothersome): fever of 102 degrees F or less headache joint pain muscle cramps, pain pain, tender at site where injected This list may not describe all possible side effects. Call your doctor for medical advice about side effects. You may report side effects to FDA at 1-800-FDA-1088. Where should I keep my medication? This vaccine is only given by a health care provider. It will not be stored at home. NOTE: This sheet is a summary. It may not cover all possible information. If you have questions about this medicine, talk to your doctor, pharmacist, or health care provider.  2023 Elsevier/Gold Standard (2019-08-23 00:00:00)

## 2021-08-25 LAB — CBC WITH DIFFERENTIAL/PLATELET
Basophils Absolute: 0 10*3/uL (ref 0.0–0.1)
Basophils Relative: 0.7 % (ref 0.0–3.0)
Eosinophils Absolute: 0.1 10*3/uL (ref 0.0–0.7)
Eosinophils Relative: 1 % (ref 0.0–5.0)
HCT: 41.6 % (ref 36.0–46.0)
Hemoglobin: 12.8 g/dL (ref 12.0–15.0)
Lymphocytes Relative: 29.2 % (ref 12.0–46.0)
Lymphs Abs: 1.5 10*3/uL (ref 0.7–4.0)
MCHC: 30.9 g/dL (ref 30.0–36.0)
MCV: 75 fl — ABNORMAL LOW (ref 78.0–100.0)
Monocytes Absolute: 0.3 10*3/uL (ref 0.1–1.0)
Monocytes Relative: 6 % (ref 3.0–12.0)
Neutro Abs: 3.3 10*3/uL (ref 1.4–7.7)
Neutrophils Relative %: 63.1 % (ref 43.0–77.0)
Platelets: 256 10*3/uL (ref 150.0–400.0)
RBC: 5.54 Mil/uL — ABNORMAL HIGH (ref 3.87–5.11)
RDW: 16.1 % — ABNORMAL HIGH (ref 11.5–15.5)
WBC: 5.2 10*3/uL (ref 4.0–10.5)

## 2021-08-25 LAB — COMPREHENSIVE METABOLIC PANEL
ALT: 20 U/L (ref 0–35)
AST: 17 U/L (ref 0–37)
Albumin: 4.3 g/dL (ref 3.5–5.2)
Alkaline Phosphatase: 59 U/L (ref 39–117)
BUN: 33 mg/dL — ABNORMAL HIGH (ref 6–23)
CO2: 28 mEq/L (ref 19–32)
Calcium: 9.7 mg/dL (ref 8.4–10.5)
Chloride: 99 mEq/L (ref 96–112)
Creatinine, Ser: 1.24 mg/dL — ABNORMAL HIGH (ref 0.40–1.20)
GFR: 46.87 mL/min — ABNORMAL LOW (ref >60.00)
Glucose, Bld: 120 mg/dL — ABNORMAL HIGH (ref 70–99)
Potassium: 4.5 mEq/L (ref 3.5–5.1)
Sodium: 139 mEq/L (ref 135–145)
Total Bilirubin: 0.7 mg/dL (ref 0.2–1.2)
Total Protein: 7.5 g/dL (ref 6.0–8.3)

## 2021-08-25 LAB — VITAMIN D 25 HYDROXY (VIT D DEFICIENCY, FRACTURES): VITD: 63.51 ng/mL (ref 30.00–100.00)

## 2021-08-25 LAB — MICROALBUMIN / CREATININE URINE RATIO
Creatinine,U: 82.8 mg/dL
Microalb Creat Ratio: 2 mg/g (ref 0.0–30.0)
Microalb, Ur: 1.6 mg/dL (ref 0.0–1.9)

## 2021-08-25 LAB — HEMOGLOBIN A1C: Hgb A1c MFr Bld: 6.8 % — ABNORMAL HIGH (ref 4.6–6.5)

## 2021-08-26 ENCOUNTER — Other Ambulatory Visit: Payer: Self-pay | Admitting: Psychiatry

## 2021-08-26 ENCOUNTER — Telehealth: Payer: Self-pay

## 2021-08-26 MED ORDER — TETRABENAZINE 12.5 MG PO TABS: 12.5000 mg | ORAL_TABLET | Freq: Every day | ORAL | 0 refills | Status: DC

## 2021-08-26 NOTE — Telephone Encounter (Signed)
received email that a prior auth was needed on the tetrabenazine 12.'5mg'$ 

## 2021-08-26 NOTE — Telephone Encounter (Signed)
went online to [email or web address] and submitted the prior auth . - pending 

## 2021-08-26 NOTE — Telephone Encounter (Signed)
received approval that medication tetrabenazine was approved from 01-03-21 to  01-02-22

## 2021-08-27 ENCOUNTER — Other Ambulatory Visit: Payer: Self-pay

## 2021-08-27 DIAGNOSIS — Z87442 Personal history of urinary calculi: Secondary | ICD-10-CM

## 2021-08-27 DIAGNOSIS — N200 Calculus of kidney: Secondary | ICD-10-CM
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
## 2021-08-30 ENCOUNTER — Telehealth: Payer: Self-pay

## 2021-08-30 NOTE — Telephone Encounter (Signed)
LMOM for pt to CB in regards to labs:   McLean-Scocuzza, Nino Glow, MD  Gracy Racer, CMA Try not to miss trulicity doses K8D sl increased from 6.5 to 6.8  BUN slightly elevated make sure drinking 50 ounces of liquid daily mostly water avoid nsaids  Kidney function stable she still has chronic kidney disease stage 3 but it has been better than GFR 46 last GFR 54 make sure staying hydrated  Blood counts stable  Vitamin D normal  Urine normal

## 2021-08-31 ENCOUNTER — Encounter: Payer: Self-pay | Admitting: Psychiatry

## 2021-08-31 ENCOUNTER — Ambulatory Visit (INDEPENDENT_AMBULATORY_CARE_PROVIDER_SITE_OTHER): Payer: Medicare HMO | Admitting: Psychiatry

## 2021-08-31 VITALS — BP 152/83 | HR 98 | Temp 97.5°F | Wt 187.0 lb

## 2021-08-31 DIAGNOSIS — F330 Major depressive disorder, recurrent, mild: Secondary | ICD-10-CM

## 2021-08-31 DIAGNOSIS — F959 Tic disorder, unspecified: Secondary | ICD-10-CM | POA: Diagnosis not present

## 2021-08-31 DIAGNOSIS — F411 Generalized anxiety disorder: Secondary | ICD-10-CM

## 2021-08-31 DIAGNOSIS — F4310 Post-traumatic stress disorder, unspecified: Secondary | ICD-10-CM | POA: Diagnosis not present

## 2021-08-31 DIAGNOSIS — R69 Illness, unspecified: Secondary | ICD-10-CM | POA: Diagnosis not present

## 2021-08-31 MED ORDER — LORAZEPAM 0.5 MG PO TABS
0.5000 mg | ORAL_TABLET | Freq: Every day | ORAL | 1 refills | Status: DC | PRN
Start: 2021-09-09 — End: 2021-11-28

## 2021-08-31 NOTE — Patient Instructions (Signed)
Start tetrabenazine 1.5 mg daily Continue lorazepam 0.5 mg daily as needed for anxiety Next appointment: 10/10 at 1 PM

## 2021-09-02 ENCOUNTER — Ambulatory Visit (INDEPENDENT_AMBULATORY_CARE_PROVIDER_SITE_OTHER): Payer: Medicare HMO | Admitting: Psychology

## 2021-09-02 DIAGNOSIS — R69 Illness, unspecified: Secondary | ICD-10-CM | POA: Diagnosis not present

## 2021-09-02 DIAGNOSIS — F4310 Post-traumatic stress disorder, unspecified: Secondary | ICD-10-CM | POA: Diagnosis not present

## 2021-09-02 DIAGNOSIS — F411 Generalized anxiety disorder: Secondary | ICD-10-CM

## 2021-09-02 NOTE — Progress Notes (Signed)
[facility] Counselor/Therapist Progress Note  Patient ID: Nancy Barajas, MRN: [id number]    Date: 09/02/21  Time Spent: 2:04 pm - 2:45 pm : 41:Minutes  Treatment Type: Individual Therapy.  Reported Symptoms: Depression and Anxiety.  Mental Status Exam: Appearance:  Casual and Well Groomed     Behavior: Appropriate  Motor: Normal  Speech/Language:  Slow  Affect: Congruent  Mood: anxious  Thought process: normal  Thought content:   WNL  Sensory/Perceptual disturbances:   WNL  Orientation: oriented to person, place, time/date, and situation  Attention: Good  Concentration: Good  Memory: WNL  Fund of knowledge:  Good  Insight:   Good  Judgment:  Good  Impulse Control: Good   Risk Assessment: Danger to Self:  No Self-injurious Behavior: No Danger to Others: No Duty to Warn:no Physical Aggression / Violence:No  Access to Firearms a concern: No  Gang Involvement:No   Subjective:   Nancy Barajas participated from home, via video, and consented to treatment. Therapist participated from office. We met online due to [place] pandemic. Nancy Barajas reviewed the events of the past week. She noted finally being approved for medication that could aid in managing her symptoms. She noted experiencing stress, at church this past week, due to her vocal cord dysfunction and noted feeling as though she was being judged by others and noted feeling "under the spotlight". She noted "pushing" to get to church prior to attending but choosing to go anyway. We worked on identifying evidence for "being judged by others". We identified her self-talk and how she was feeling about self, which led to her departure. She noted the stress at church, in general, and noted negative feedback from the preacher at time. Therapist encouraged Nancy Barajas to continue engaging socially, challenged assumptions about others' feelings and thoughts/feelings, and get engaged in physical exercise. She noted  disturbed sleep due her daughter's work schedule. Therapist encouraged self-care. Therapist provided supportive therapy.   Interventions: Cognitive Behavioral Therapy and Interpersonal  Diagnosis:  PTSD (post-traumatic stress disorder)  GAD (generalized anxiety disorder)  Treatment Plan:  Client Abilities/Strengths Nancy Barajas is forthcoming and motivated for change.   Support System: [employer] OPT  Client Statement of Needs Nancy Barajas would like to focus on herself, manage her own symptoms, be more engaged in activity.   Treatment Level Weekly  Symptoms  Anxiety: Decreased consistent worry (COVID, daughter, Terrified of elevators [improved]), muscle tension, decreased panic attacks (2-3x per week), decreased difficulty managing worry, easily fatigued, slightly decreased disturbed sleep (erratic sleep schedule),  Claustrophobia. (Status: maintained) Depression:  frequent sadness, improved low motivation, loss of interest, denied any SI.  Shame/embarrassment regarding vocal cord disorder.    (Status: improved)  Goals:   Target Date:09/25/21 Frequency: Weekly  Progress: 0 Modality: individual    Therapist will provide referrals for additional resources as appropriate.  Therapist will provide psycho-education regarding Nancy Barajas's diagnosis and corresponding treatment approaches and interventions. Licensed Clinical Social Worker, [place], LCSW will support the patient's ability to achieve the goals identified. will employ CBT, BA, Problem-solving, Solution Focused, Mindfulness,  coping skills, & other evidenced-based practices will be used to promote progress towards healthy functioning to help manage decrease symptoms associated with her diagnosis.   Reduce overall level, frequency, and intensity of the feelings of depression, anxiety and panic evidenced by decreased overall symptoms from 6 to 7 days/week to 0 to 1 days/week per client report for at  least 3 consecutive months. Verbally express understanding of the relationship between feelings  of depression, anxiety and their impact on thinking patterns and behaviors. Verbalize an understanding of the role that distorted thinking plays in creating fears, excessive worry, and ruminations.  Nancy Barajas participated in the creation of the treatment plan)   Buena Irish, LCSW

## 2021-09-07 ENCOUNTER — Other Ambulatory Visit: Payer: Medicare HMO

## 2021-09-07 DIAGNOSIS — N200 Calculus of kidney: Secondary | ICD-10-CM

## 2021-09-07 DIAGNOSIS — Z87442 Personal history of urinary calculi: Secondary | ICD-10-CM | POA: Diagnosis not present

## 2021-09-07 LAB — URINALYSIS, COMPLETE
Bilirubin, UA: NEGATIVE
Ketones, UA: NEGATIVE
Leukocytes,UA: NEGATIVE
Nitrite, UA: NEGATIVE
Protein,UA: NEGATIVE
Specific Gravity, UA: 1.015 (ref 1.005–1.030)
Urobilinogen, Ur: 0.2 mg/dL (ref 0.2–1.0)
pH, UA: 5 (ref 5.0–7.5)

## 2021-09-07 LAB — MICROSCOPIC EXAMINATION

## 2021-09-09 ENCOUNTER — Ambulatory Visit: Payer: Medicare HMO | Admitting: Urology

## 2021-09-09 ENCOUNTER — Encounter: Payer: Self-pay | Admitting: Urology

## 2021-09-09 VITALS — BP 131/84 | HR 98 | Ht 70.0 in | Wt 181.8 lb

## 2021-09-09 DIAGNOSIS — N200 Calculus of kidney: Secondary | ICD-10-CM

## 2021-09-09 DIAGNOSIS — R3121 Asymptomatic microscopic hematuria: Secondary | ICD-10-CM | POA: Diagnosis not present

## 2021-09-09 DIAGNOSIS — N3281 Overactive bladder: Secondary | ICD-10-CM | POA: Diagnosis not present

## 2021-09-09 DIAGNOSIS — Z87442 Personal history of urinary calculi: Secondary | ICD-10-CM | POA: Diagnosis not present

## 2021-09-09 MED ORDER — SODIUM BICARBONATE 650 MG PO TABS: 650.0000 mg | ORAL_TABLET | Freq: Two times a day (BID) | ORAL | 11 refills | Status: DC

## 2021-09-09 NOTE — Progress Notes (Signed)
   09/09/2021 2:15 PM   Nancy Barajas 04-10-1960 [id number]  Reason for visit: Follow up uric acid nephrolithiasis, microscopic hematuria, OAB  HPI: 61 year old female I follow for the above issues.  She underwent left ureteroscopy, laser lithotripsy, and stent placement in September 2021 for a large 2 cm left renal pelvis stone, with stone analysis showing uric acid.  Stent was subsequently removed, and she was started on sodium bicarbonate(cannot take potassium citrate) at that time for alkalinization.  She denies any stone episodes since our last visit.  Recent urinalysis showed low-grade microscopic hematuria with 3-10 RBCs but was otherwise benign, urine pH 5.  She has had extensive work-up for microscopic hematuria previously which was benign.  I also reviewed her outside BMP that shows stable renal function, normal sodium.  She has some mild urinary symptoms of occasional urgency, nocturia 1-2 times overnight that are minimally bothersome.  We previously had discussed OAB medications, but she is not bothered enough to consider meds at this time.  Continue sodium bicarbonate for uric acid stone prevention, refilled RTC 1 year with renal ultrasound prior for uric acid stone surveillance  Billey Co, MD  [facility] [address], [place], [place] [address] [phone]

## 2021-09-16 ENCOUNTER — Other Ambulatory Visit: Payer: Self-pay

## 2021-09-16 DIAGNOSIS — I428 Other cardiomyopathies: Secondary | ICD-10-CM

## 2021-09-16 MED ORDER — ENTRESTO 97-103 MG PO TABS: 1.0000 | ORAL_TABLET | Freq: Two times a day (BID) | ORAL | 0 refills | Status: DC

## 2021-09-28 ENCOUNTER — Ambulatory Visit (INDEPENDENT_AMBULATORY_CARE_PROVIDER_SITE_OTHER): Payer: Medicare HMO | Admitting: Psychology

## 2021-09-28 DIAGNOSIS — F4310 Post-traumatic stress disorder, unspecified: Secondary | ICD-10-CM | POA: Diagnosis not present

## 2021-09-28 DIAGNOSIS — R69 Illness, unspecified: Secondary | ICD-10-CM | POA: Diagnosis not present

## 2021-09-28 DIAGNOSIS — F411 Generalized anxiety disorder: Secondary | ICD-10-CM | POA: Diagnosis not present
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
## 2021-09-30 ENCOUNTER — Telehealth: Payer: Self-pay | Admitting: Psychiatry

## 2021-09-30 NOTE — Telephone Encounter (Signed)
spke with patient she was notified that the pharmacy had to order medication and to contact the pharmacy tomorrow afternoon about status

## 2021-09-30 NOTE — Telephone Encounter (Signed)
Noted, thanks!

## 2021-09-30 NOTE — Telephone Encounter (Signed)
Patient called to reschedule appointment. Stated there was a medication supposed to be called in but she has not received. Could not recall name of it. Please advise if prescription was sent.

## 2021-09-30 NOTE — Telephone Encounter (Signed)
I believe it is tetrabenazine, and it was ordered. I believe we have not received prior authorization for this. Jess, could you contact the pharmacy and figure out why she cannot fill this medication? Thanks.

## 2021-09-30 NOTE — Telephone Encounter (Signed)
per the pharmacy they have the rx but they had to order the medication. they hope to get medication in either tomorrow or monday

## 2021-10-12 ENCOUNTER — Encounter: Admission: RE | Payer: Self-pay | Source: Ambulatory Visit

## 2021-10-12 ENCOUNTER — Ambulatory Visit: Admission: RE | Admit: 2021-10-12 | Payer: Medicare HMO | Source: Ambulatory Visit

## 2021-10-12 ENCOUNTER — Ambulatory Visit: Payer: Medicare HMO | Admitting: Psychiatry

## 2021-10-12 SURGERY — COLONOSCOPY WITH PROPOFOL
Anesthesia: General

## 2021-10-26 ENCOUNTER — Other Ambulatory Visit: Payer: Self-pay | Admitting: Psychiatry

## 2021-10-26 ENCOUNTER — Ambulatory Visit: Payer: Medicare HMO | Admitting: Psychology

## 2021-11-01 ENCOUNTER — Encounter (INDEPENDENT_AMBULATORY_CARE_PROVIDER_SITE_OTHER): Payer: Self-pay

## 2021-11-17 DIAGNOSIS — I10 Essential (primary) hypertension: Secondary | ICD-10-CM | POA: Diagnosis not present

## 2021-11-17 DIAGNOSIS — N1831 Chronic kidney disease, stage 3a: Secondary | ICD-10-CM | POA: Diagnosis not present

## 2021-11-17 DIAGNOSIS — E1122 Type 2 diabetes mellitus with diabetic chronic kidney disease: Secondary | ICD-10-CM | POA: Diagnosis not present

## 2021-11-18 ENCOUNTER — Ambulatory Visit: Payer: Medicare HMO | Admitting: Psychology

## 2021-11-18 DIAGNOSIS — F4310 Post-traumatic stress disorder, unspecified: Secondary | ICD-10-CM | POA: Diagnosis not present

## 2021-11-18 DIAGNOSIS — F411 Generalized anxiety disorder: Secondary | ICD-10-CM

## 2021-11-18 DIAGNOSIS — R69 Illness, unspecified: Secondary | ICD-10-CM | POA: Diagnosis not present

## 2021-11-18 NOTE — Progress Notes (Signed)
[facility] Counselor/Therapist Progress Note  Patient ID: Nancy Barajas, MRN: [id number]   Date: 11/18/21  Time Spent: 1:04  pm - 1:48 pm : 44 Minutes  Treatment Type: Individual Therapy.  Reported Symptoms: anxiety and depression  Mental Status Exam: Appearance:  Casual     Behavior: Appropriate  Motor: Normal  Speech/Language:  Clear and Coherent  Affect: Appropriate  Mood: dysthymic  Thought process: normal  Thought content:   WNL  Sensory/Perceptual disturbances:   WNL  Orientation: oriented to person, place, time/date, and situation  Attention: Good  Concentration: Good  Memory: WNL  Fund of knowledge:  Good  Insight:   Good  Judgment:  Good  Impulse Control: Good   Risk Assessment: Danger to Self:  No Self-injurious Behavior: No Danger to Others: No Duty to Warn:no Physical Aggression / Violence:No  Access to Firearms a concern: No  Gang Involvement:No   Subjective:   Nancy Barajas participated from home, via video and consented to treatment. Therapist participated from home office. We met online due to [place] pandemic. Nancy Barajas reviewed the events of the past week. She noted her medication, Tetrabenzine and noted this activating her VCD and noted providing feedback to her psychiatric provider. She noted her VCD being a barrier to engagement.   We reviewed numerous treatment approaches including CBT, BA, Problem Solving, and Solution focused therapy. Psych-education regarding the Teri's diagnosis of PTSD (post-traumatic stress disorder)  GAD (generalized anxiety disorder) was provided during the session. We discussed Andres Huhn's goals treatment goals which include better manage her anxiety, discontinue her lorazapam prescription, and become more social. . Nancy Barajas provided verbal approval of the treatment plan.  Nancy Barajas noted taking Lorazapam .22m qd-prn and noted her interest in discontinuing the medication.     Interventions: Psycho-education & Goal Setting.   Diagnosis:  PTSD (post-traumatic stress disorder)  GAD (generalized anxiety disorder)  Psychiatric Treatment: Yes , RCorky Mull MD   Treatment Plan:  Client Abilities/Strengths KJamaiyahis motivated and self-aware.   Support System: CK[employer] Morgan EnergyOPT  Client Statement of Needs KKerriannewould like to better manage her anxiety, discontinue her lorazapam prescription, and become more social.   Treatment Level Weekly  Symptoms  Depression: loss of interest, feeling down, difficulty sleeping, fluctuating appetite, difficulty concentrating.   (Status: maintained) Anxiety: feeling anxious, difficulty controlling worry, worrying about different things, trouble relaxing, and feeling awful as if something might happen.    (Status: maintained)  Goals:   KSirenityexperiences symptoms of anxiety and depression.    Target Date: 11/19/22 Frequency: Weekly  Progress: 0 Modality: individual    Therapist will provide referrals for additional resources as appropriate.  Therapist will provide psycho-education regarding Necola's diagnosis and corresponding treatment approaches and interventions. Licensed Clinical Social Worker, [place] LCSW will support the patient's ability to achieve the goals identified. will employ CBT, BA, Problem-solving, Solution Focused, Mindfulness,  coping skills, & other evidenced-based practices will be used to promote progress towards healthy functioning to help manage decrease symptoms associated with her diagnosis.   Reduce overall level, frequency, and intensity of the feelings of depression, anxiety and panic evidenced by decreased her overall symptom from 6 to 7 days/week to 0 to 1 days/week per client report for at least 3 consecutive months. Verbally express understanding of the relationship between feelings of depression, anxiety and their impact on thinking  patterns and behaviors. Verbalize an understanding of the role that distorted thinking plays in creating fears, excessive worry, and ruminations.    (  Kynlea participated in the creation of the treatment plan)    Buena Irish, LCSW

## 2021-11-19 ENCOUNTER — Encounter: Payer: Self-pay | Admitting: Pulmonary Disease

## 2021-11-19 ENCOUNTER — Ambulatory Visit: Payer: Medicare HMO | Admitting: Pulmonary Disease

## 2021-11-19 ENCOUNTER — Ambulatory Visit (INDEPENDENT_AMBULATORY_CARE_PROVIDER_SITE_OTHER): Payer: Medicare HMO | Admitting: Pulmonary Disease

## 2021-11-19 VITALS — BP 118/80 | HR 97 | Temp 97.8°F | Ht 70.0 in | Wt 183.6 lb

## 2021-11-19 DIAGNOSIS — R0602 Shortness of breath: Secondary | ICD-10-CM

## 2021-11-19 DIAGNOSIS — I5020 Unspecified systolic (congestive) heart failure: Secondary | ICD-10-CM | POA: Diagnosis not present

## 2021-11-19 DIAGNOSIS — N183 Chronic kidney disease, stage 3 unspecified: Secondary | ICD-10-CM

## 2021-11-19 DIAGNOSIS — J383 Other diseases of vocal cords: Secondary | ICD-10-CM | POA: Diagnosis not present

## 2021-11-19 DIAGNOSIS — G4730 Sleep apnea, unspecified: Secondary | ICD-10-CM

## 2021-11-19 NOTE — Patient Instructions (Signed)
You did well with your walking test today.  So your lungs are doing what they are supposed to do.  You do have severe sleep apnea.  We have placed a referral to [facility] to show you how to use the machine and to set it up for you.  They can also set you up with a mask that fits.  We will see you in follow-up in 3 to 4 months time call sooner should any new problems arise.

## 2021-11-19 NOTE — Progress Notes (Signed)
Subjective:    Patient ID: Nancy Barajas, female    DOB: [birth year], 61 y.o.   MRN: [id number] Patient Care Team: McLean-Scocuzza, Nino Glow, MD as PCP - General (Internal Medicine) Kate Sable, MD as PCP - Cardiology (Cardiology) Tyler Pita, MD as Consulting Physician (Pulmonary Disease) Vladimir Crofts, MD as Consulting Physician (Neurology)  Chief Complaint  Patient presents with   Follow-up    SOB is constant. No cough.     HPI Patient is a 61 year old former smoker with 3 of smoking and a history as noted below, follows for the issue of dyspnea and obstructive sleep apnea.  I have not seen the patient since July 2022 however in the interim she has seen our nurse practitioner Derl Barrow, NP.  A home sleep study was performed on 20 October 2020 that showed the patient has severe obstructive sleep apnea with AHI of 32.1 and oxygen saturations as low as 85%.  Recall also the patient has spastic dysphonia and possible movement disorder.  However there has been discrepancy between her normal neurology and [facility].  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  Due to her dysphonia and audible stridor the patient has not been able to perform PFTs.  Did have a simple spirometry in February 2020 that was nonrevealing with the exception of evidence of vocal cord dysfunction.  She was ordered to have CPAP after sleep studies results were known however, the patient could not afford it.  She did get a free CPAP however this has not been set up and she does not understand the use of the device.  She complains of "constant" shortness of breath however, this is secondary to her laryngeal dystonia/spasmodic dysphonia effects on her upper airway.  In this situation CPAP also be beneficial.  Since her last visit here she has not had any fevers, chills or sweats.  No cough or sputum production.  He has not had any chest pain.  No  orthopnea or paroxysmal nocturnal dyspnea.  Does endorse nonrestorative sleep.   Review of Systems A 10 point review of systems was performed and it is as noted above otherwise negative.  Patient Active Problem List   Diagnosis Date Noted   Laryngeal dystonia/Spasmodic Dysphonia 11/19/2021   Severe sleep apnea 11/19/2021   Skin tag 07/02/2020   Seborrheic keratosis 07/02/2020   Anxiety and depression 02/19/2020   Obstructive uropathy 08/01/2019   Gallstones 52/77/8242   Umbilical hernia 35/36/1443   Hematuria 07/15/2019   Hypertension associated with diabetes ([place]) 07/11/2019   Vitamin D deficiency 07/11/2019   Hiatal hernia 07/11/2019   Obesity (BMI 30.0-34.9) 07/11/2019   Heart failure with reduced ejection fraction (HCC)    GAD (generalized anxiety disorder) 04/06/2019   Proteinuria 03/04/2019   Aortic atherosclerosis ([place]) 03/04/2019   Thyroid nodule 03/04/2019   Kidney cysts 03/04/2019   Hemangioma of liver 03/04/2019   Sinusitis 03/04/2019   Kidney stones    CKD stage 3 secondary to diabetes ([place]) 08/22/2018   Laryngeal spasm 05/07/2018   Muscle tension dysphonia 05/07/2018   Shortness of breath 01/22/2018   Uncontrolled hypertension 01/22/2018   Type 2 diabetes mellitus without complication, without long-term current use of insulin ([place]) 01/22/2018   Mixed hyperlipidemia 01/22/2018   Mild persistent asthma 01/22/2018   Social History   Tobacco Use   Smoking status: Former    Packs/day: 1.00    Years: 10.00    Total pack years: 10.00  Types: Cigarettes    Quit date: 01/03/1998    Years since quitting: 23.8   Smokeless tobacco: Never  Substance Use Topics   Alcohol use: Not Currently   Allergies  Allergen Reactions   Penicillins Rash    Did it involve swelling of the face/tongue/throat, SOB, or low BP? No Did it involve sudden or severe rash/hives, skin peeling, or any reaction on the inside of your mouth or nose? No Did you need to seek medical  attention at a hospital or doctor's office? No When did it last happen?      Childhood If all above answers are "NO", may proceed with cephalosporin use.   Current Meds  Medication Sig   amLODipine (NORVASC) 5 MG tablet Take 1 tablet (5 mg total) by mouth daily.   blood glucose meter kit and supplies Dispense based on patient and insurance preference. Use twice daily as directed. (FOR ICD-10 E10.9, E11.9).   carvedilol (COREG) 25 MG tablet Take 1 tablet (25 mg total) by mouth 2 (two) times daily with a meal.   Cholecalciferol (VITAMIN D-3) 125 MCG (5000 UT) TABS Take 1 tablet by mouth daily.   COVID-19 mRNA bivalent vaccine, Pfizer, (PFIZER COVID-19 VAC BIVALENT) injection Inject into the muscle.   Dulaglutide (TRULICITY) 1.5 CB/4.4HQ SOPN Inject 1.5 mg into the skin once a week.   FARXIGA 10 MG TABS tablet Take 1 tablet (10 mg total) by mouth daily.   glucose blood (ONETOUCH ULTRA) test strip USE TO CHECK BLOOD SUGAR TWICE DAILY AS DIRECTED   Lancets (ONETOUCH ULTRASOFT) lancets Use as instructed bid   Lancets MISC 1 Device by Does not apply route in the morning, at noon, and at bedtime. Dispense for onetouch ultra 2 machine ONLY   montelukast (SINGULAIR) 10 MG tablet Take 1 tablet (10 mg total) by mouth daily.   Multiple Vitamins-Minerals (CENTRUM SILVER 50+WOMEN PO) Take 1 tablet by mouth daily.   rosuvastatin (CRESTOR) 20 MG tablet Take 1 tablet (20 mg total) by mouth daily.   sacubitril-valsartan (ENTRESTO) 97-103 MG Take 1 tablet by mouth 2 (two) times daily. PLEASE SCHEDULE OFFICE VISIT FOR FURTHER REFILLS. THANK YOU!   sodium bicarbonate 650 MG tablet Take 1 tablet (650 mg total) by mouth 2 (two) times daily.   sodium chloride (OCEAN) 0.65 % SOLN nasal spray Place 1 spray into both nostrils as needed for congestion.    spironolactone (ALDACTONE) 25 MG tablet Take 1 tablet (25 mg total) by mouth daily.   tetrabenazine (XENAZINE) 12.5 MG tablet Take 1 tablet (12.5 mg total) by mouth  daily.   Immunization History  Administered Date(s) Administered   Influenza,inj,Quad PF,6+ Mos 10/10/2017, 11/07/2018, 11/12/2019, 01/08/2021   PFIZER(Purple Top)SARS-COV-2 Vaccination 05/23/2019, 06/13/2019, 12/10/2019   PNEUMOCOCCAL CONJUGATE-20 08/24/2021   Pfizer Covid-19 Vaccine Bivalent Booster 57yr & up 01/12/2021   Pneumococcal Polysaccharide-23 07/10/2019   Tdap 02/11/2021       Objective:   Physical Exam BP 118/80 (BP Location: Left Arm, Cuff Size: Normal)   Pulse 97   Temp 97.8 F (36.6 C)   Ht _0  (1.778 m)   Wt 183 lb 9.6 oz (83.3 kg)   LMP 01/03/2013   SpO2 100%   BMI 26.34 kg/m  GENERAL: Well-developed somewhat overweight woman, no acute distress.  Speaks with a hoarse voice, halting, frequent inspiratory stridorous sounds. HEAD: Normocephalic, atraumatic. EYES: Pupils equal, round, reactive to light.  No scleral icterus. MOUTH: Dentition intact, NECK: Supple. No thyromegaly. Trachea midline. No JVD.  No adenopathy.  PULMONARY: Upper airway pseudowheeze.  Good air entry bilaterally.  No adventitious sounds. CARDIOVASCULAR: S1 and S2. Regular rate and rhythm.  No rubs, murmurs or gallops heard. ABDOMEN: Benign. MUSCULOSKELETAL: No joint deformity, no clubbing, no edema. NEUROLOGIC: No overt focal deficit. SKIN: Intact,warm,dry. PSYCH: Anxious/depressed mood.  Ambulatory oximetry was performed today: Patient ambulated 750 feet total.  Oxygen saturations remained between 100 to 99% throughout the whole entire test.  Heart rate averaged 106 bpm.     Assessment & Plan:     ICD-10-CM   1. Severe sleep apnea  G47.30 AMB REFERRAL FOR DME   AHI 32.1, oxygen desaturations to 85 per sent during sleep Recommend auto CPAP 5 to 20 cm H2O Heated humidity and mask of choice DME referral    2. Laryngeal dystonia/Spasmodic Dysphonia  J38.3    This issue adds complexity to her management Issue whether this is more functional rather than organic issue Main driver  of patient's sensation of dyspnea    3. Shortness of breath  R06.02    Mostly driven by her upper airway symptoms    4. 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  Dyspnea is more related to her issues with laryngeal dystonia/spasmodic dysphonia and heart failure with reduced ejection fraction.  Her oxygen saturations remained at 99 to 100% throughout ambulatory oximetry today.  Will see the patient in follow-up in 3 to 4 months time she is to contact us prior to that time should any new issues arise.  Renold Don, MD Advanced Bronchoscopy P[facility]    *This note was dictated using voice recognition software/Dragon.  Despite best efforts to proofread, errors can occur which can change the meaning. Any transcriptional errors that result from this process are unintentional and may not be fully corrected at the time of dictation.
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
## 2021-11-23 ENCOUNTER — Ambulatory Visit (INDEPENDENT_AMBULATORY_CARE_PROVIDER_SITE_OTHER): Payer: Medicare HMO | Admitting: Psychiatry

## 2021-11-23 ENCOUNTER — Encounter: Payer: Self-pay | Admitting: Psychiatry

## 2021-11-23 VITALS — BP 124/77 | HR 96 | Temp 97.7°F | Ht 70.0 in | Wt 183.2 lb

## 2021-11-23 DIAGNOSIS — F330 Major depressive disorder, recurrent, mild: Secondary | ICD-10-CM

## 2021-11-23 DIAGNOSIS — F959 Tic disorder, unspecified: Secondary | ICD-10-CM

## 2021-11-23 DIAGNOSIS — R69 Illness, unspecified: Secondary | ICD-10-CM | POA: Diagnosis not present

## 2021-11-23 DIAGNOSIS — F4310 Post-traumatic stress disorder, unspecified: Secondary | ICD-10-CM | POA: Diagnosis not present

## 2021-11-23 MED ORDER — MIRTAZAPINE 7.5 MG PO TABS: 7.5000 mg | ORAL_TABLET | Freq: Every day | ORAL | 1 refills | Status: DC

## 2021-11-23 NOTE — Patient Instructions (Signed)
Hold tetrabenazine Start mirtazapine 7.5 mg at night  Continue lorazepam 0.5 mg daily as needed for anxiety Next appointment: 1/18 at 4:30

## 2021-11-28 ENCOUNTER — Other Ambulatory Visit: Payer: Self-pay | Admitting: Psychiatry

## 2021-11-29 ENCOUNTER — Other Ambulatory Visit: Payer: Self-pay | Admitting: Psychiatry

## 2021-12-06 ENCOUNTER — Encounter: Payer: Self-pay | Admitting: Pulmonary Disease

## 2021-12-06 DIAGNOSIS — F331 Major depressive disorder, recurrent, moderate: Secondary | ICD-10-CM | POA: Insufficient documentation

## 2021-12-15 ENCOUNTER — Ambulatory Visit: Payer: Medicare HMO | Admitting: Psychology

## 2021-12-15 ENCOUNTER — Other Ambulatory Visit: Payer: Self-pay

## 2021-12-15 DIAGNOSIS — I428 Other cardiomyopathies: Secondary | ICD-10-CM

## 2021-12-15 MED ORDER — ENTRESTO 97-103 MG PO TABS: 1.0000 | ORAL_TABLET | Freq: Two times a day (BID) | ORAL | 0 refills | Status: DC

## 2021-12-15 NOTE — Telephone Encounter (Signed)
Entresto refill # 60 tablets only to pharmacy with message patient needs appointment for future refills / 2nd attempt

## 2021-12-17 ENCOUNTER — Ambulatory Visit (INDEPENDENT_AMBULATORY_CARE_PROVIDER_SITE_OTHER): Payer: Medicare HMO | Admitting: Psychology

## 2021-12-17 DIAGNOSIS — F4310 Post-traumatic stress disorder, unspecified: Secondary | ICD-10-CM | POA: Diagnosis not present

## 2021-12-17 DIAGNOSIS — F330 Major depressive disorder, recurrent, mild: Secondary | ICD-10-CM | POA: Diagnosis not present

## 2021-12-17 DIAGNOSIS — F411 Generalized anxiety disorder: Secondary | ICD-10-CM

## 2021-12-17 DIAGNOSIS — R69 Illness, unspecified: Secondary | ICD-10-CM | POA: Diagnosis not present

## 2021-12-17 NOTE — Progress Notes (Unsigned)
[facility] Counselor/Therapist Progress Note  Patient ID: Nancy Barajas, MRN: [id number]   Date: 12/17/21  Time Spent: 3:38  pm - 4:25 pm : 47Minutes  Treatment Type: Individual Therapy.  Reported Symptoms: anxiety and depression  Mental Status Exam: Appearance:  Casual     Behavior: Appropriate  Motor: Normal  Speech/Language:  Clear and Coherent  Affect: Appropriate  Mood: dysthymic  Thought process: normal  Thought content:   WNL  Sensory/Perceptual disturbances:   WNL  Orientation: oriented to person, place, time/date, and situation  Attention: Good  Concentration: Good  Memory: WNL  Fund of knowledge:  Good  Insight:   Good  Judgment:  Good  Impulse Control: Good   Risk Assessment: Danger to Self:  No Self-injurious Behavior: No Danger to Others: No Duty to Warn:no Physical Aggression / Violence:No  Access to Firearms a concern: No  Gang Involvement:No   Subjective:   [name] participated from home, via video and consented to treatment. Therapist participated from home office. We met online due to [place] pandemic. Tynia reviewed the events of the past week. [name] noted recently meeting with her psychiatric provider and noted being prescribed medication, Mirtazipine, which she noted seems to have worsened her symptoms. Therapist encouraged [name] to contact her prescriber. She noted her anxiety being at a higher level. She noted anxiety and sadness due to people not understanding her experience with VCD. She noted her friends "understanding" but noted that she feels like her friends get "mad" when she does not participate. We worked on identifying feelings and [name] noted her worry not being warranted in evidence but in how she feelings. We worked on challenging this and therapist provided psycho-education regarding challenging negative thoughts and feelings and integrating thoughts and feelings. She noted worry about her daughter and  discussed this during the session.   Interventions: Psycho-education & Goal Setting.   Diagnosis:  MDD (major depressive disorder), recurrent episode, mild (HCC)  PTSD (post-traumatic stress disorder)  GAD (generalized anxiety disorder)  Psychiatric Treatment: Yes , Corky Mull, MD   Treatment Plan:  Client Abilities/Strengths Jahnia is motivated and self-aware.   Support System: [employer] OPT  Client Statement of Needs Destony would like to better manage her anxiety, discontinue her lorazapam prescription, and become more social.   Treatment Level Weekly  Symptoms  Depression: loss of interest, feeling down, difficulty sleeping, fluctuating appetite, difficulty concentrating.   (Status: maintained) Anxiety: feeling anxious, difficulty controlling worry, worrying about different things, trouble relaxing, and feeling awful as if something might happen.    (Status: maintained)  Goals:   Jobeth experiences symptoms of anxiety and depression.    Target Date: 11/19/22 Frequency: Weekly  Progress: 0 Modality: individual    Therapist will provide referrals for additional resources as appropriate.  Therapist will provide psycho-education regarding Trenita's diagnosis and corresponding treatment approaches and interventions. Licensed Clinical Social Worker, [place], LCSW will support the patient's ability to achieve the goals identified. will employ CBT, BA, Problem-solving, Solution Focused, Mindfulness,  coping skills, & other evidenced-based practices will be used to promote progress towards healthy functioning to help manage decrease symptoms associated with her diagnosis.   Reduce overall level, frequency, and intensity of the feelings of depression, anxiety and panic evidenced by decreased her overall symptom from 6 to 7 days/week to 0 to 1 days/week per client report for at least 3 consecutive months. Verbally express  understanding of the relationship between feelings of depression, anxiety and their impact  on thinking patterns and behaviors. Verbalize an understanding of the role that distorted thinking plays in creating fears, excessive worry, and ruminations.    [name] participated in the creation of the treatment plan)    Buena Irish, LCSW

## 2022-01-10 DIAGNOSIS — G4733 Obstructive sleep apnea (adult) (pediatric): Secondary | ICD-10-CM | POA: Diagnosis not present

## 2022-01-14 ENCOUNTER — Other Ambulatory Visit: Payer: Self-pay | Admitting: *Deleted

## 2022-01-14 DIAGNOSIS — I428 Other cardiomyopathies: Secondary | ICD-10-CM

## 2022-01-14 MED ORDER — ENTRESTO 97-103 MG PO TABS: 1.0000 | ORAL_TABLET | Freq: Two times a day (BID) | ORAL | 0 refills | Status: DC

## 2022-01-14 NOTE — Telephone Encounter (Signed)
Scheduled 01/31
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
## 2022-01-19 ENCOUNTER — Ambulatory Visit (INDEPENDENT_AMBULATORY_CARE_PROVIDER_SITE_OTHER): Payer: Medicare HMO | Admitting: Psychology

## 2022-01-19 DIAGNOSIS — R69 Illness, unspecified: Secondary | ICD-10-CM | POA: Diagnosis not present

## 2022-01-19 DIAGNOSIS — F411 Generalized anxiety disorder: Secondary | ICD-10-CM

## 2022-01-19 DIAGNOSIS — F4310 Post-traumatic stress disorder, unspecified: Secondary | ICD-10-CM

## 2022-01-19 DIAGNOSIS — F330 Major depressive disorder, recurrent, mild: Secondary | ICD-10-CM

## 2022-01-19 NOTE — Progress Notes (Signed)
[facility] Counselor/Therapist Progress Note  Patient ID: Nancy Barajas, MRN: [id number]   Date: 01/19/22  Time Spent: 12:04  pm - 12:56 pm : 52 Minutes  Treatment Type: Individual Therapy.  Reported Symptoms: anxiety and depression  Mental Status Exam: Appearance:  Casual     Behavior: Appropriate  Motor: Normal  Speech/Language:  Clear and Coherent  Affect: Appropriate  Mood: dysthymic  Thought process: normal  Thought content:   WNL  Sensory/Perceptual disturbances:   WNL  Orientation: oriented to person, place, time/date, and situation  Attention: Good  Concentration: Good  Memory: WNL  Fund of knowledge:  Good  Insight:   Good  Judgment:  Good  Impulse Control: Good   Risk Assessment: Danger to Self:  No Self-injurious Behavior: No Danger to Others: No Duty to Warn:no Physical Aggression / Violence:No  Access to Firearms a concern: No  Gang Involvement:No   Subjective:   [name] participated from home, via video and consented to treatment. Therapist participated from home office. We met online due to [place] pandemic. [name] reviewed the events of the past week. [name] noted being slated to see her psychiatric provider in 1 week. She noted feeling a little down and out, at times, and rated her mood a 5/10. She noted a recent increase in her anxiety after a week of not taking her Lorazepam and discussed this leading to taking the medication. She noted being activated by going into the grocery store but could not a identify a cause. She will work identifying elements to her perception of grocery store. She noted her increased tics being a trigger. We reviewed distress tolerance  during the session and psycho-education was provided during the session. Handout was provided during the session, via email, for review. We worked on identifying her triggers and rescripting a recent distressing event using tools learned during the session. Therapist  encouraged [name] to employ this tool, going forward, to be processed during our follow-up. Psycho-education regarding anxiety and avoidance was provided during the session. Therapist praised [name] and scheduled a follow-up appointment. Therapist provided supportive therapy.   Interventions: CBT  Diagnosis:  MDD (major depressive disorder), recurrent episode, mild (HCC)  PTSD (post-traumatic stress disorder)  GAD (generalized anxiety disorder)  Psychiatric Treatment: Yes , Corky Mull, MD   Treatment Plan:  Client Abilities/Strengths [name] is motivated and self-aware.   Support System: [employer] OPT  Client Statement of Needs [name] would like to better manage her anxiety, discontinue her lorazapam prescription, and become more social.   Treatment Level Weekly  Symptoms  Depression: loss of interest, feeling down, difficulty sleeping, fluctuating appetite, difficulty concentrating.   (Status: maintained) Anxiety: feeling anxious, difficulty controlling worry, worrying about different things, trouble relaxing, and feeling awful as if something might happen.    (Status: maintained)  Goals:   Wileen experiences symptoms of anxiety and depression.    Target Date: 11/19/22 Frequency: Weekly  Progress: 0 Modality: individual    Therapist will provide referrals for additional resources as appropriate.  Therapist will provide psycho-education regarding [name]'s diagnosis and corresponding treatment approaches and interventions. Licensed Clinical Social Worker, [place], LCSW will support the patient's ability to achieve the goals identified. will employ CBT, BA, Problem-solving, Solution Focused, Mindfulness,  coping skills, & other evidenced-based practices will be used to promote progress towards healthy functioning to help manage decrease symptoms associated with her diagnosis.   Reduce overall level, frequency, and intensity of  the feelings of depression, anxiety and  panic evidenced by decreased her overall symptom from 6 to 7 days/week to 0 to 1 days/week per client report for at least 3 consecutive months. Verbally express understanding of the relationship between feelings of depression, anxiety and their impact on thinking patterns and behaviors. Verbalize an understanding of the role that distorted thinking plays in creating fears, excessive worry, and ruminations.    [name] participated in the creation of the treatment plan)    Buena Irish, LCSW

## 2022-01-20 ENCOUNTER — Other Ambulatory Visit: Payer: Self-pay | Admitting: Psychiatry

## 2022-01-20 ENCOUNTER — Ambulatory Visit: Payer: Medicare HMO | Admitting: Psychiatry
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
## 2022-01-27 ENCOUNTER — Ambulatory Visit (INDEPENDENT_AMBULATORY_CARE_PROVIDER_SITE_OTHER): Payer: Medicare HMO | Admitting: Psychiatry

## 2022-01-27 ENCOUNTER — Encounter: Payer: Self-pay | Admitting: Psychiatry

## 2022-01-27 VITALS — BP 143/81 | HR 97 | Temp 98.1°F | Ht 70.0 in | Wt 189.6 lb

## 2022-01-27 DIAGNOSIS — F330 Major depressive disorder, recurrent, mild: Secondary | ICD-10-CM | POA: Diagnosis not present

## 2022-01-27 DIAGNOSIS — F4310 Post-traumatic stress disorder, unspecified: Secondary | ICD-10-CM | POA: Diagnosis not present

## 2022-01-27 DIAGNOSIS — F411 Generalized anxiety disorder: Secondary | ICD-10-CM | POA: Diagnosis not present

## 2022-01-27 DIAGNOSIS — R69 Illness, unspecified: Secondary | ICD-10-CM | POA: Diagnosis not present

## 2022-01-27 DIAGNOSIS — F959 Tic disorder, unspecified: Secondary | ICD-10-CM | POA: Diagnosis not present

## 2022-01-27 MED ORDER — GABAPENTIN 100 MG PO CAPS: 100.0000 mg | ORAL_CAPSULE | Freq: Three times a day (TID) | ORAL | 1 refills | Status: DC

## 2022-01-27 MED ORDER — LORAZEPAM 0.5 MG PO TABS: 0.5000 mg | ORAL_TABLET | Freq: Every day | ORAL | 1 refills | Status: DC | PRN

## 2022-01-27 NOTE — Patient Instructions (Signed)
Hold mirtazapine Start gabapentin 100 mg three times a day Continue lorazepam 0.5 mg daily as needed for anxiety Next appointment: 3/25 at 1 PM

## 2022-02-02 ENCOUNTER — Encounter: Payer: Self-pay | Admitting: Cardiology

## 2022-02-02 ENCOUNTER — Ambulatory Visit: Payer: Medicare HMO | Attending: Cardiology | Admitting: Cardiology

## 2022-02-02 VITALS — BP 122/82 | HR 97 | Ht 70.0 in | Wt 187.0 lb

## 2022-02-02 DIAGNOSIS — I428 Other cardiomyopathies: Secondary | ICD-10-CM

## 2022-02-02 DIAGNOSIS — E7800 Pure hypercholesterolemia, unspecified: Secondary | ICD-10-CM | POA: Diagnosis not present

## 2022-02-02 DIAGNOSIS — I10 Essential (primary) hypertension: Secondary | ICD-10-CM | POA: Diagnosis not present

## 2022-02-02 NOTE — Progress Notes (Signed)
Cardiology Office Note:    Date:  02/02/2022   ID:  Nancy Barajas, DOB 1960-03-16, MRN [id number]  PCP:  McLean-Scocuzza, Nino Glow, MD (Inactive)  Cardiologist:  Kate Sable, MD  Electrophysiologist:  None   Referring MD: McLean-Scocuzza, Olivia Mackie *   Chief Complaint  Patient presents with   12 month follow up     Patient c/o fluctuating blood pressure & shortness of breath at times. Medications reviewed by the patient verbally.     History of Present Illness:    Nancy Barajas is a 62 y.o. female with a hx of hypertension, NICM, initial EF 25-30%, improved to 40-45% on GDMT, diabetes, hyperlipidemia, CKD stage III, who presents for follow-up.    Being seen for nonischemic cardiomyopathy, denies chest pain or edema.  Recently started on Ativan for anxiety, states having occasional hiccups due to this.  Overall doing well from a cardiac perspective.  Takes medications as prescribed.  Prior notes Echo 10/2020 EF 50 to 55% Echo 09/2019 EF 40 to 45%. Echo 03/20/2019 EF 25 to 30% Left heart catheter 04/2019 no evidence of CAD, normal coronary arteries   Past Medical History:  Diagnosis Date   Anxiety    Asthma    Depression    Diabetes mellitus without complication ([place])    History of kidney stones    Hypertension    Kidney stones    Vertigo    when laying flat   Vocal cord dysfunction    makes it hard for patient to talk and breathe    Past Surgical History:  Procedure Laterality Date   CARDIAC CATHETERIZATION     CYSTOSCOPY W/ RETROGRADES Right 09/27/2019   Procedure: CYSTOSCOPY WITH RETROGRADE PYELOGRAM;  Surgeon: Billey Co, MD;  Location: [facility];  Service: Urology;  Laterality: Right;   CYSTOSCOPY/URETEROSCOPY/HOLMIUM LASER/STENT PLACEMENT Left 09/27/2019   Procedure: CYSTOSCOPY/URETEROSCOPY/HOLMIUM LASER/STENT PLACEMENT;  Surgeon: Billey Co, MD;  Location: [facility];  Service: Urology;  Laterality: Left;   LITHOTRIPSY     RIGHT/LEFT HEART CATH  AND CORONARY ANGIOGRAPHY Bilateral 04/08/2019   Procedure: RIGHT/LEFT HEART CATH AND CORONARY ANGIOGRAPHY;  Surgeon: Wellington Hampshire, MD;  Location: [place] [facility];  Service: Cardiovascular;  Laterality: Bilateral;    Current Medications: Current Meds  Medication Sig   amLODipine (NORVASC) 5 MG tablet Take 1 tablet (5 mg total) by mouth daily.   carvedilol (COREG) 25 MG tablet Take 1 tablet (25 mg total) by mouth 2 (two) times daily with a meal.   Cholecalciferol (VITAMIN D-3) 125 MCG (5000 UT) TABS Take 1 tablet by mouth daily.   Dulaglutide (TRULICITY) 1.5 JE/5.6DJ SOPN Inject 1.5 mg into the skin once a week.   escitalopram (LEXAPRO) 10 MG tablet Take 10 mg by mouth in the morning, at noon, and at bedtime.   FARXIGA 10 MG TABS tablet Take 1 tablet (10 mg total) by mouth daily.   LORazepam (ATIVAN) 0.5 MG tablet Take 1 tablet (0.5 mg total) by mouth daily as needed for anxiety.   mirtazapine (REMERON) 7.5 MG tablet Take 1 tablet (7.5 mg total) by mouth at bedtime.   montelukast (SINGULAIR) 10 MG tablet Take 1 tablet (10 mg total) by mouth daily.   Multiple Vitamins-Minerals (CENTRUM SILVER 50+WOMEN PO) Take 1 tablet by mouth daily.   rosuvastatin (CRESTOR) 20 MG tablet Take 1 tablet (20 mg total) by mouth daily.   sacubitril-valsartan (ENTRESTO) 97-103 MG Take 1 tablet by mouth 2 (two) times daily.   sodium bicarbonate 650 MG  tablet Take 1 tablet (650 mg total) by mouth 2 (two) times daily.   sodium chloride (OCEAN) 0.65 % SOLN nasal spray Place 1 spray into both nostrils as needed for congestion.    spironolactone (ALDACTONE) 25 MG tablet Take 1 tablet (25 mg total) by mouth daily.     Allergies:   Penicillins   Social History   Socioeconomic History   Marital status: Divorced    Spouse name: Not on file   Number of children: 2   Years of education: Not on file   Highest education level: 9th grade  Occupational History   Not on file  Tobacco Use   Smoking status: Former     Packs/day: 1.00    Years: 10.00    Total pack years: 10.00    Types: Cigarettes    Quit date: 01/03/1998    Years since quitting: 24.0   Smokeless tobacco: Never  Vaping Use   Vaping Use: Never used  Substance and Sexual Activity   Alcohol use: Not Currently   Drug use: Not Currently    Types: Marijuana    Comment: years ago   Sexual activity: Not Currently  Other Topics Concern   Not on file  Social History Narrative   Lives at home    2 daughters as of 04/04/19 26 y.o daughter lives with her (she works)   Patient is on disability.   Social Determinants of Health   Financial Resource Strain: Low Risk  (01/06/2021)   Overall Financial Resource Strain (CARDIA)    Difficulty of Paying Living Expenses: Not hard at all  Food Insecurity: Food Insecurity Present (02/03/2020)   Hunger Vital Sign    Worried About Running Out of Food in the Last Year: Sometimes true    Ran Out of Food in the Last Year: Sometimes true  Transportation Needs: No Transportation Needs (02/15/2021)   PRAPARE - Hydrologist (Medical): No    Lack of Transportation (Non-Medical): No  Physical Activity: Unknown (01/15/2019)   Exercise Vital Sign    Days of Exercise per Week: 0 days    Minutes of Exercise per Session: Not on file  Stress: Stress Concern Present (03/19/2020)   [place]    Feeling of Stress : To some extent  Social Connections: Unknown (02/15/2021)   Social Connection and Isolation Panel [NHANES]    Frequency of Communication with Friends and Family: Three times a week    Frequency of Social Gatherings with Friends and Family: More than three times a week    Attends Religious Services: Never    Marine scientist or Organizations: Yes    Attends Archivist Meetings: Never    Marital Status: Not on file     Family History: The patient's family history includes Diabetes in her father  and mother; Hypertension in her father and mother. There is no history of Breast cancer.  ROS:   Please see the history of present illness.     All other systems reviewed and are negative.  EKGs/Labs/Other Studies Reviewed:    The following studies were reviewed today:   EKG:  EKG is  ordered today.  The ekg ordered today demonstrates normal sinus rhythm, left bundle branch block  Recent Labs: 02/08/2021: TSH 0.70 08/24/2021: ALT 20; BUN 33; Creatinine, Ser 1.24; Hemoglobin 12.8; Platelets 256.0; Potassium 4.5; Sodium 139  Recent Lipid Panel    Component Value Date/Time  CHOL 201 (H) 02/08/2021 0848   TRIG 70.0 02/08/2021 0848   HDL 104.10 02/08/2021 0848   CHOLHDL 2 02/08/2021 0848   VLDL 14.0 02/08/2021 0848   LDLCALC 83 02/08/2021 0848    Physical Exam:    VS:  BP 122/82 (BP Location: Left Arm, Patient Position: Sitting, Cuff Size: Normal)   Pulse 97   Ht '5\' 10"'$  (1.778 m)   Wt 187 lb (84.8 kg)   LMP 01/03/2013   SpO2 98%   BMI 26.83 kg/m     Wt Readings from Last 3 Encounters:  02/02/22 187 lb (84.8 kg)  11/19/21 183 lb 9.6 oz (83.3 kg)  09/09/21 181 lb 12.8 oz (82.5 kg)     GEN:  Well nourished, well developed in no acute distress, obese HEENT: Normal NECK: No JVD; No carotid bruits LYMPHATICS: No lymphadenopathy CARDIAC: RRR, no murmurs, rubs, gallops RESPIRATORY:  Clear to auscultation without rales, wheezing or rhonchi  ABDOMEN: Soft, non-tender, non-distended MUSCULOSKELETAL:  No edema; No deformity  SKIN: Warm and dry NEUROLOGIC:  Alert and oriented x 3 PSYCHIATRIC:  Normal affect   ASSESSMENT:    1. NICM (nonischemic cardiomyopathy) ([place])   2. Primary hypertension   3. Pure hypercholesterolemia    PLAN:    In order of problems listed above:  Nonischemic cardiomyopathy, initial EF 25 to 30%. Last echo 10/2020 EF improved now 50 to 55%.  Continue Coreg, Entresto, Aldactone, Farxiga.  She is euvolemic, describes NYHA class II symptoms.  Repeat  echo. hypertension, blood pressure controlled.  Continue Coreg 25 mg twice daily, Entresto, Aldactone '25mg'$  daily, Norvasc 5 mg daily.   hyperlipidemia, continue Crestor.  Follow-up in 12 months.   This note was generated in part or whole with voice recognition software. Voice recognition is usually quite accurate but there are transcription errors that can and very often do occur. I apologize for any typographical errors that were not detected and corrected.  Medication Adjustments/Labs and Tests Ordered: Current medicines are reviewed at length with the patient today.  Concerns regarding medicines are outlined above.  Orders Placed This Encounter  Procedures   EKG 12-Lead   ECHOCARDIOGRAM COMPLETE     No orders of the defined types were placed in this encounter.     Patient Instructions  Medication Instructions:   Your physician recommends that you continue on your current medications as directed. Please refer to the Current Medication list given to you today.  *If you need a refill on your cardiac medications before your next appointment, please call your pharmacy*   Lab Work:  None Ordered  If you have labs (blood work) drawn today and your tests are completely normal, you will receive your results only by: [place] (if you have MyChart) OR A paper copy in the mail If you have any lab test that is abnormal or we need to change your treatment, we will call you to review the results.   Testing/Procedures:  Your physician has requested that you have an echocardiogram in 3 months . Echocardiography is a painless test that uses sound waves to create images of your heart. It provides your doctor with information about the size and shape of your heart and how well your heart's chambers and valves are working. This procedure takes approximately one hour. There are no restrictions for this procedure. Please do NOT wear cologne, perfume, aftershave, or lotions (deodorant  is allowed). Please arrive 15 minutes prior to your appointment time.   Follow-Up: At [facility], you and your health needs are our priority.  As part of our continuing mission to provide you with exceptional heart care, we have created designated Provider Care Teams.  These Care Teams include your primary Cardiologist (physician) and Advanced Practice Providers (APPs -  Physician Assistants and Nurse Practitioners) who all work together to provide you with the care you need, when you need it.  We recommend signing up for the patient portal called "MyChart".  Sign up information is provided on this After Visit Summary.  MyChart is used to connect with patients for Virtual Visits (Telemedicine).  Patients are able to view lab/test results, encounter notes, upcoming appointments, etc.  Non-urgent messages can be sent to your provider as well.   To learn more about what you can do with MyChart, go to [email or web address].    Your next appointment:   12 month(s)  Provider:   You may see Kate Sable, MD or one of the following Advanced Practice Providers on your designated Care Team:   Murray Hodgkins, NP Christell Faith, PA-C Cadence Kathlen Mody, PA-C Gerrie Nordmann, NP   Signed, Kate Sable, MD  02/02/2022 3:36 PM    [place]

## 2022-02-02 NOTE — Patient Instructions (Signed)
Medication Instructions:   Your physician recommends that you continue on your current medications as directed. Please refer to the Current Medication list given to you today.  *If you need a refill on your cardiac medications before your next appointment, please call your pharmacy*   Lab Work:  None Ordered  If you have labs (blood work) drawn today and your tests are completely normal, you will receive your results only by: [place] (if you have MyChart) OR A paper copy in the mail If you have any lab test that is abnormal or we need to change your treatment, we will call you to review the results.   Testing/Procedures:  Your physician has requested that you have an echocardiogram in 3 months . Echocardiography is a painless test that uses sound waves to create images of your heart. It provides your doctor with information about the size and shape of your heart and how well your heart's chambers and valves are working. This procedure takes approximately one hour. There are no restrictions for this procedure. Please do NOT wear cologne, perfume, aftershave, or lotions (deodorant is allowed). Please arrive 15 minutes prior to your appointment time.   Follow-Up: At [facility], you and your health needs are our priority.  As part of our continuing mission to provide you with exceptional heart care, we have created designated Provider Care Teams.  These Care Teams include your primary Cardiologist (physician) and Advanced Practice Providers (APPs -  Physician Assistants and Nurse Practitioners) who all work together to provide you with the care you need, when you need it.  We recommend signing up for the patient portal called "MyChart".  Sign up information is provided on this After Visit Summary.  MyChart is used to connect with patients for Virtual Visits (Telemedicine).  Patients are able to view lab/test results, encounter notes, upcoming appointments, etc.  Non-urgent  messages can be sent to your provider as well.   To learn more about what you can do with MyChart, go to [email or web address].    Your next appointment:   12 month(s)  Provider:   You may see Kate Sable, MD or one of the following Advanced Practice Providers on your designated Care Team:   Murray Hodgkins, NP Christell Faith, PA-C Cadence Kathlen Mody, PA-C Gerrie Nordmann, NP

## 2022-02-10 DIAGNOSIS — G4733 Obstructive sleep apnea (adult) (pediatric): Secondary | ICD-10-CM | POA: Diagnosis not present

## 2022-02-16 ENCOUNTER — Ambulatory Visit (INDEPENDENT_AMBULATORY_CARE_PROVIDER_SITE_OTHER): Payer: Medicare HMO | Admitting: Psychology

## 2022-02-16 DIAGNOSIS — R69 Illness, unspecified: Secondary | ICD-10-CM | POA: Diagnosis not present

## 2022-02-16 DIAGNOSIS — F4310 Post-traumatic stress disorder, unspecified: Secondary | ICD-10-CM

## 2022-02-16 DIAGNOSIS — F330 Major depressive disorder, recurrent, mild: Secondary | ICD-10-CM

## 2022-02-16 NOTE — Progress Notes (Signed)
[place] Counselor/Therapist Progress Note  Patient ID: Nancy Barajas, MRN: [id number]   Date: 02/16/22  Time Spent: 10:59 am - 11:44 am : 45 Minutes  Treatment Type: Individual Therapy.  Reported Symptoms: anxiety and depression  Mental Status Exam: Appearance:  Casual     Behavior: Appropriate  Motor: Normal  Speech/Language:  Clear and Coherent  Affect: Appropriate  Mood: anxious and dysthymic  Thought process: normal  Thought content:   WNL  Sensory/Perceptual disturbances:   WNL  Orientation: oriented to person, place, time/date, and situation  Attention: Good  Concentration: Good  Memory: WNL  Fund of knowledge:  Good  Insight:   Good  Judgment:  Good  Impulse Control: Good   Risk Assessment: Danger to Self:  No Self-injurious Behavior: No Danger to Others: No Duty to Warn:no Physical Aggression / Violence:No  Access to Firearms a concern: No  Gang Involvement:No   Subjective:   Nancy Barajas participated from home, via video and consented to treatment. Therapist participated from home office. We met online due to Nancy Barajas pandemic. Nancy Barajas reviewed the events of the past week. Nancy Barajas noted recently getting a CPAP machine and noted this worsening her anxiety. She noted this being due to the straps and the air pressure that affects her anxiety. She noted  attempting to problem-solve this issue to no avail. She noted feeling claustrophobic with the mask and straps. She noted attempting to build a level of comfort incrementally, to no avail. She noted switching the mask, contacting the company, and noted her intent to contact the prescribing pulmonologist. She noted being prescribed Gabapentin 135m tid  by per Dr. HModesta Messing She noted working on identifying triggers to her anxiety and working on communicating  her experience to friends. We discussed identifying possible support groups, online, to receive empathy and understanding regarding her  diagnosis in addition to possible resources. Therapist provided some resources to pursue during the session. Nancy Barajas engaged and motivated during the session and expressed commitment towards the goals. Therapist praised Nancy Barajas. Therapist provided supportive therapy.   Interventions: CBT  Diagnosis:  MDD (major depressive disorder), recurrent episode, mild (HCC)  PTSD (post-traumatic stress disorder)  Psychiatric Treatment: Yes , RCorky Mull MD   Treatment Plan:  Client Abilities/Strengths KNanais motivated and self-aware.   Support System: CKinder Morgan EnergyOPT  Client Statement of Needs KTykiawould like to better manage her anxiety, discontinue her lorazapam prescription, and become more social.   Treatment Level Weekly  Symptoms  Depression: loss of interest, feeling down, difficulty sleeping, fluctuating appetite, difficulty concentrating.   (Status: maintained) Anxiety: feeling anxious, difficulty controlling worry, worrying about different things, trouble relaxing, and feeling awful as if something might happen.    (Status: maintained)  Goals:   KLielaexperiences symptoms of anxiety and depression.    Target Date: 11/19/22 Frequency: Weekly  Progress: 0 Modality: individual    Therapist will provide referrals for additional resources as appropriate.  Therapist will provide psycho-education regarding Nancy Barajas's diagnosis and corresponding treatment approaches and interventions. Licensed Clinical Social Worker, [place] LCSW will support the patient's ability to achieve the goals identified. will employ CBT, BA, Problem-solving, Solution Focused, Mindfulness,  coping skills, & other evidenced-based practices will be used to promote progress towards healthy functioning to help manage decrease symptoms associated with her diagnosis.   Reduce overall level, frequency, and intensity of the  feelings of depression, anxiety and panic evidenced by decreased her overall symptom from  6 to 7 days/week to 0 to 1 days/week per client report for at least 3 consecutive months. Verbally express understanding of the relationship between feelings of depression, anxiety and their impact on thinking patterns and behaviors. Verbalize an understanding of the role that distorted thinking plays in creating fears, excessive worry, and ruminations.    [name] participated in the creation of the treatment plan)    Buena Irish, LCSW

## 2022-03-11 DIAGNOSIS — G4733 Obstructive sleep apnea (adult) (pediatric): Secondary | ICD-10-CM | POA: Diagnosis not present

## 2022-03-14 ENCOUNTER — Other Ambulatory Visit: Payer: Self-pay

## 2022-03-14 ENCOUNTER — Telehealth: Payer: Self-pay

## 2022-03-14 ENCOUNTER — Telehealth: Payer: Self-pay | Admitting: Cardiology

## 2022-03-14 DIAGNOSIS — I428 Other cardiomyopathies: Secondary | ICD-10-CM

## 2022-03-14 MED ORDER — ENTRESTO 97-103 MG PO TABS: 1.0000 | ORAL_TABLET | Freq: Two times a day (BID) | ORAL | 3 refills | Status: DC

## 2022-03-14 NOTE — Telephone Encounter (Signed)
Disp Refills Start End   sacubitril-valsartan (ENTRESTO) 97-103 MG 60 tablet 3 03/14/2022    Sig - Route: Take 1 tablet by mouth 2 (two) times daily. - Oral   Sent to pharmacy as: sacubitril-valsartan (ENTRESTO) 97-103 MG   E-Prescribing Status: Receipt confirmed by pharmacy (03/14/2022  2:36 PM EDT)    Associated Diagnoses  NICM (nonischemic cardiomyopathy) [facility])     Pharmacy  [facility] [place] (N), [place] - [address]

## 2022-03-14 NOTE — Telephone Encounter (Signed)
 *  STAT* If patient is at the pharmacy, call can be transferred to refill team.   1. Which medications need to be refilled? (please list name of each medication and dose if known)   sacubitril-valsartan (ENTRESTO) 97-103 MG    2. Which pharmacy/location (including street and city if local pharmacy) is medication to be sent to? [facility] (N), [place] - [address]    3. Do they need a 30 day or 90 day supply? 30 days

## 2022-03-16 ENCOUNTER — Ambulatory Visit: Payer: Medicare HMO | Admitting: Psychology

## 2022-03-22 DIAGNOSIS — N1831 Chronic kidney disease, stage 3a: Secondary | ICD-10-CM | POA: Diagnosis not present

## 2022-03-22 DIAGNOSIS — I129 Hypertensive chronic kidney disease with stage 1 through stage 4 chronic kidney disease, or unspecified chronic kidney disease: Secondary | ICD-10-CM | POA: Diagnosis not present

## 2022-03-22 DIAGNOSIS — E1122 Type 2 diabetes mellitus with diabetic chronic kidney disease: Secondary | ICD-10-CM | POA: Diagnosis not present

## 2022-03-22 DIAGNOSIS — D631 Anemia in chronic kidney disease: Secondary | ICD-10-CM | POA: Diagnosis not present

## 2022-03-22 DIAGNOSIS — R809 Proteinuria, unspecified: Secondary | ICD-10-CM | POA: Diagnosis not present

## 2022-03-22 DIAGNOSIS — E1129 Type 2 diabetes mellitus with other diabetic kidney complication: Secondary | ICD-10-CM | POA: Diagnosis not present

## 2022-03-22 DIAGNOSIS — I10 Essential (primary) hypertension: Secondary | ICD-10-CM | POA: Diagnosis not present

## 2022-03-22 DIAGNOSIS — N2581 Secondary hyperparathyroidism of renal origin: Secondary | ICD-10-CM | POA: Diagnosis not present

## 2022-03-25 ENCOUNTER — Other Ambulatory Visit: Payer: Self-pay | Admitting: Psychiatry
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
## 2022-03-28 ENCOUNTER — Ambulatory Visit (INDEPENDENT_AMBULATORY_CARE_PROVIDER_SITE_OTHER): Payer: Medicare HMO | Admitting: Psychiatry

## 2022-03-28 ENCOUNTER — Encounter: Payer: Self-pay | Admitting: Psychiatry

## 2022-03-28 VITALS — BP 126/81 | HR 93 | Temp 98.2°F | Ht 70.0 in | Wt 193.0 lb

## 2022-03-28 DIAGNOSIS — F4310 Post-traumatic stress disorder, unspecified: Secondary | ICD-10-CM | POA: Diagnosis not present

## 2022-03-28 DIAGNOSIS — E1122 Type 2 diabetes mellitus with diabetic chronic kidney disease: Secondary | ICD-10-CM | POA: Diagnosis not present

## 2022-03-28 DIAGNOSIS — F411 Generalized anxiety disorder: Secondary | ICD-10-CM | POA: Diagnosis not present

## 2022-03-28 DIAGNOSIS — F330 Major depressive disorder, recurrent, mild: Secondary | ICD-10-CM

## 2022-03-28 DIAGNOSIS — F959 Tic disorder, unspecified: Secondary | ICD-10-CM

## 2022-03-28 DIAGNOSIS — N1831 Chronic kidney disease, stage 3a: Secondary | ICD-10-CM | POA: Diagnosis not present

## 2022-03-28 DIAGNOSIS — I10 Essential (primary) hypertension: Secondary | ICD-10-CM | POA: Diagnosis not present

## 2022-03-28 DIAGNOSIS — R69 Illness, unspecified: Secondary | ICD-10-CM | POA: Diagnosis not present

## 2022-03-28 MED ORDER — BUSPIRONE HCL 5 MG PO TABS: 5.0000 mg | ORAL_TABLET | Freq: Two times a day (BID) | ORAL | 1 refills | Status: AC

## 2022-03-28 NOTE — Patient Instructions (Signed)
  Discontinue gabapentin  Start Buspar 5 mg twice a day Continue lorazepam 0.5 mg daily as needed for anxiety Next appointment: 5/6 at 11:30

## 2022-03-29 ENCOUNTER — Telehealth: Payer: Self-pay | Admitting: Pulmonary Disease

## 2022-03-29 DIAGNOSIS — G4730 Sleep apnea, unspecified: Secondary | ICD-10-CM

## 2022-03-29 NOTE — Telephone Encounter (Signed)
Pt states cpap isn't working for her. Wants to know if possible to change pressure or something before she completely gives up on it

## 2022-03-29 NOTE — Telephone Encounter (Signed)
Called and spoke to patient. She stated that she is unable to tolerate cpap. Last worn >47mo ago. She doesn't straps and she feels that the pressure is too strong.  Dr. Patsey Berthold, please advise. Thanks

## 2022-03-29 NOTE — Telephone Encounter (Signed)
Spoke to patient and relayed below message/recommendations. She agrees with titration study. Order has been placed.  Appt scheduled 05/05/2022 at 10:30 with Dr. Halford Chessman. Nothing further needed.

## 2022-03-29 NOTE — Telephone Encounter (Signed)
She has very severe problems with her sleep.  She needs to have an in lab titration study and may need BiPAP/AVAPS.  She also needs to be seen with sleep provider as her issues are complex with her upper airway dystonia.

## 2022-04-11 DIAGNOSIS — G4733 Obstructive sleep apnea (adult) (pediatric): Secondary | ICD-10-CM | POA: Diagnosis not present

## 2022-04-12 ENCOUNTER — Ambulatory Visit: Payer: Medicare HMO | Admitting: Psychology

## 2022-05-02 ENCOUNTER — Other Ambulatory Visit: Payer: Self-pay | Admitting: Psychiatry

## 2022-05-03 ENCOUNTER — Ambulatory Visit: Payer: Medicare HMO | Attending: Cardiology

## 2022-05-03 DIAGNOSIS — I428 Other cardiomyopathies: Secondary | ICD-10-CM

## 2022-05-03 LAB — ECHOCARDIOGRAM COMPLETE
AR max vel: 3.33 cm2
AV Area VTI: 3.39 cm2
AV Area mean vel: 3.34 cm2
AV Mean grad: 2 mmHg
AV Peak grad: 4.5 mmHg
Ao pk vel: 1.06 m/s
S' Lateral: 3.4 cm
Single Plane A4C EF: 42.4 %

## 2022-05-04 ENCOUNTER — Ambulatory Visit: Payer: Medicare HMO | Admitting: Psychology

## 2022-05-05 ENCOUNTER — Ambulatory Visit: Payer: Medicare HMO | Admitting: Pulmonary Disease

## 2022-05-06 ENCOUNTER — Telehealth: Payer: Self-pay | Admitting: Cardiology

## 2022-05-06 NOTE — Progress Notes (Signed)
Appointment to be rescheduled after she completes sleep study.

## 2022-05-06 NOTE — Telephone Encounter (Signed)
Pt returning call for echo results  

## 2022-05-09 ENCOUNTER — Other Ambulatory Visit: Payer: Self-pay

## 2022-05-09 ENCOUNTER — Ambulatory Visit: Payer: Medicare HMO

## 2022-05-09 ENCOUNTER — Ambulatory Visit: Payer: Medicare HMO | Admitting: Psychiatry

## 2022-05-09 DIAGNOSIS — I428 Other cardiomyopathies: Secondary | ICD-10-CM

## 2022-05-09 DIAGNOSIS — R195 Other fecal abnormalities: Secondary | ICD-10-CM | POA: Diagnosis not present

## 2022-05-09 NOTE — Telephone Encounter (Signed)
The patient has been notified of the result and verbalized understanding.  All questions (if any) were answered. Margrett Rud, [place] 05/09/2022 12:53 PM

## 2022-05-11 DIAGNOSIS — G4733 Obstructive sleep apnea (adult) (pediatric): Secondary | ICD-10-CM | POA: Diagnosis not present

## 2022-05-20 ENCOUNTER — Other Ambulatory Visit: Payer: Self-pay | Admitting: Psychiatry

## 2022-05-20 ENCOUNTER — Ambulatory Visit (INDEPENDENT_AMBULATORY_CARE_PROVIDER_SITE_OTHER): Payer: Medicare HMO | Admitting: Psychology

## 2022-05-20 DIAGNOSIS — F330 Major depressive disorder, recurrent, mild: Secondary | ICD-10-CM

## 2022-05-20 DIAGNOSIS — F4310 Post-traumatic stress disorder, unspecified: Secondary | ICD-10-CM | POA: Diagnosis not present

## 2022-05-20 NOTE — Progress Notes (Signed)
[facility] Counselor/Therapist Progress Note  Patient ID: Nancy Barajas, MRN: [id number]   Date: 05/20/22  Time Spent: 12:33 pm - 1:21  pm : 48 Minutes  Treatment Type: Individual Therapy.  Reported Symptoms: anxiety and depression  Mental Status Exam: Appearance:  Casual     Behavior: Appropriate  Motor: Normal  Speech/Language:  Clear and Coherent  Affect: Appropriate  Mood: anxious and dysthymic  Thought process: normal  Thought content:   WNL  Sensory/Perceptual disturbances:   WNL  Orientation: oriented to person, place, time/date, and situation  Attention: Good  Concentration: Good  Memory: WNL  Fund of knowledge:  Good  Insight:   Good  Judgment:  Good  Impulse Control: Good   Risk Assessment: Danger to Self:  No Self-injurious Behavior: No Danger to Others: No Duty to Warn:no Physical Aggression / Violence:No  Access to Firearms a concern: No  Gang Involvement:No   Subjective:   Nancy Barajas participated from home, via video and consented to treatment. Therapist participated from home office. We met online due to COVID pandemic. Nancy Barajas reviewed the events of the past week. Nancy Barajas noted feeling depressed due to not being to be social due to her vocal cord dysfunction. She noted her interest in discontinuing this medication, the lorazepam and Buspar. She note having a recent sleep study and having panic symptoms when having the mask on. We discussed contacting her prescribed and giving feedback regarding the issue.  Therapist provided psychoeducation regarding anxiety management.  We discussed relaxation exercises including guided meditation and acupressure, one hand, to relieve stress.  Resources were provided via email for reference and review.  Additionally therapist encouraged Nancy Barajas to identify possible support groups related to her vocal cord dysfunction and resources were provided as well.  Finally therapist encouraged Nancy Barajas to contact  her psychiatrist to regarding her medication and to contact a local specialist in the area who might be able to provide a second opinion regarding her vocal cord dysfunction.  Therapist validated and normalized His feelings and experience and provided supportive therapy.  Follow-up was scheduled for continued treatment which she benefits from.  Interventions: CBT  Diagnosis:  MDD (major depressive disorder), recurrent episode, mild ([facility])  PTSD (post-traumatic stress disorder)  Psychiatric Treatment: Yes , Azucena Cecil, MD   Treatment Plan:  Client Abilities/Strengths Frayda is motivated and self-aware.   Support System: [employer] OPT  Client Statement of Needs Cedra would like to better manage her anxiety, discontinue her lorazapam prescription, and become more social.   Treatment Level Weekly  Symptoms  Depression: loss of interest, feeling down, difficulty sleeping, fluctuating appetite, difficulty concentrating.   (Status: maintained) Anxiety: feeling anxious, difficulty controlling worry, worrying about different things, trouble relaxing, and feeling awful as if something might happen.    (Status: maintained)  Goals:   Remi experiences symptoms of anxiety and depression.    Target Date: 11/19/22 Frequency: Weekly  Progress: 0 Modality: individual    Therapist will provide referrals for additional resources as appropriate.  Therapist will provide psycho-education regarding Keyonna's diagnosis and corresponding treatment approaches and interventions. Licensed Clinical Social Worker, [place], LCSW will support the patient's ability to achieve the goals identified. will employ CBT, BA, Problem-solving, Solution Focused, Mindfulness,  coping skills, & other evidenced-based practices will be used to promote progress towards healthy functioning to help manage decrease symptoms associated with her diagnosis.   Reduce overall  level, frequency, and intensity of the feelings of depression, anxiety and panic evidenced  by decreased her overall symptom from 6 to 7 days/week to 0 to 1 days/week per client report for at least 3 consecutive months. Verbally express understanding of the relationship between feelings of depression, anxiety and their impact on thinking patterns and behaviors. Verbalize an understanding of the role that distorted thinking plays in creating fears, excessive worry, and ruminations.    Nancy Barajas participated in the creation of the treatment plan)    Delight Ovens, LCSW

## 2022-05-22 NOTE — Progress Notes (Deleted)
   SUBJECTIVE:  No chief complaint on file.  HPI ***  PERTINENT PMH / PSH: HTN DM Type 2 HLD PTSD CKD   OBJECTIVE:  LMP 01/03/2013    Physical Exam  ASSESSMENT/PLAN:  Colon cancer screening  Breast cancer screening by mammogram  Diabetic eye exam (HCC)   PDMP reviewed***  No follow-ups on file.  Dana Allan, MD

## 2022-05-23 ENCOUNTER — Encounter: Payer: Medicare HMO | Admitting: Family Medicine

## 2022-05-23 DIAGNOSIS — E119 Type 2 diabetes mellitus without complications: Secondary | ICD-10-CM

## 2022-05-23 DIAGNOSIS — Z1211 Encounter for screening for malignant neoplasm of colon: Secondary | ICD-10-CM

## 2022-05-23 DIAGNOSIS — Z1231 Encounter for screening mammogram for malignant neoplasm of breast: Secondary | ICD-10-CM

## 2022-05-27 ENCOUNTER — Telehealth: Payer: Self-pay

## 2022-05-27 NOTE — Telephone Encounter (Signed)
Received note from sleep works. Patient arrived for cpap titration study and was not able to tolerate cpap. The study was not started. Patient left after signing AMA.  Per Dr. Jayme Cloud- recommend seeing Dr. Craige Cotta to see if alternatives are possible.   Spoke to patient she agrees with plan, however Dr. Evlyn Courier schedule is not open for June/July yet. Will leave message in triage to ensure follow up. Will contact patient once schedule is open.

## 2022-06-05 ENCOUNTER — Other Ambulatory Visit: Payer: Self-pay | Admitting: Psychiatry

## 2022-06-06 ENCOUNTER — Encounter: Payer: Medicare HMO | Admitting: Internal Medicine

## 2022-06-09 NOTE — Telephone Encounter (Signed)
Dr. Evlyn Courier schedule is still not open. Will continue to hold.

## 2022-06-11 DIAGNOSIS — G4733 Obstructive sleep apnea (adult) (pediatric): Secondary | ICD-10-CM | POA: Diagnosis not present

## 2022-06-17 ENCOUNTER — Ambulatory Visit (INDEPENDENT_AMBULATORY_CARE_PROVIDER_SITE_OTHER): Payer: Medicare HMO | Admitting: Psychology

## 2022-06-17 DIAGNOSIS — F330 Major depressive disorder, recurrent, mild: Secondary | ICD-10-CM | POA: Diagnosis not present

## 2022-06-17 DIAGNOSIS — F4310 Post-traumatic stress disorder, unspecified: Secondary | ICD-10-CM | POA: Diagnosis not present

## 2022-06-17 NOTE — Progress Notes (Signed)
[facility] Counselor/Therapist Progress Note  Patient ID: Nancy Barajas, MRN: [id number]   Date: 06/17/22  Time Spent: 1:01 pm - 1:52 pm : 51 Minutes  Treatment Type: Individual Therapy.  Reported Symptoms: anxiety and depression  Mental Status Exam: Appearance:  Casual     Behavior: Appropriate  Motor: Normal  Speech/Language:  Clear and Coherent  Affect: Appropriate  Mood: anxious and dysthymic  Thought process: normal  Thought content:   WNL  Sensory/Perceptual disturbances:   WNL  Orientation: oriented to person, place, time/date, and situation  Attention: Good  Concentration: Good  Memory: WNL  Fund of knowledge:  Good  Insight:   Good  Judgment:  Good  Impulse Control: Good   Risk Assessment: Danger to Self:  No Self-injurious Behavior: No Danger to Others: No Duty to Warn:no Physical Aggression / Violence:No  Access to Firearms a concern: No  Gang Involvement:No   Subjective:   Nancy Barajas participated from home, via video, is aware of the limitations of tele-sessions, and consented to treatment. Therapist participated from home office.  Nancy Barajas reviewed the events of the past week. She noted some confusion regarding the resources previously provided and Nancy Barajas committed to reviewing them again between sessions. She noted barriers to attempting the guided meditation include a need for a larger screen device. She noted a need to change medications and discussed her interest in discontinuing her xanax. Therapist encouraged Nancy Barajas to work on reviewing the support group information previously provided. She noted a lack of social support particularly those who are more understanding of her VCD. She noted interpersonal pressure, from a friend, to rejoin the church. She noted this same friend attempting to "set her up" with someone and therapist highlighted negative self-talk during the session regarding her educational level and VCD. She states that  she does not feel "good enough". Therapist provided psycho-education regarding core beliefs. We will work on processing this going forward. Therapist provided supportive therapy, acknowledged [name]'s vulnerability, and provided supportive therapy.   Interventions: CBT  Diagnosis:  MDD (major depressive disorder), recurrent episode, mild (HCC)  PTSD (post-traumatic stress disorder)  Psychiatric Treatment: Yes , Azucena Cecil, MD   Treatment Plan:  Client Abilities/Strengths Nancy Barajas is motivated and self-aware.   Support System: [employer] OPT  Client Statement of Needs Nancy Barajas would like to better manage her anxiety, discontinue her lorazapam prescription, and become more social.   Treatment Level Weekly  Symptoms  Depression: loss of interest, feeling down, difficulty sleeping, fluctuating appetite, difficulty concentrating.   (Status: maintained) Anxiety: feeling anxious, difficulty controlling worry, worrying about different things, trouble relaxing, and feeling awful as if something might happen.    (Status: maintained)  Goals:   [name] experiences symptoms of anxiety and depression.    Target Date: 11/19/22 Frequency: Weekly  Progress: 0 Modality: individual    Therapist will provide referrals for additional resources as appropriate.  Therapist will provide psycho-education regarding Nancy Barajas's diagnosis and corresponding treatment approaches and interventions. Licensed Clinical Social Worker, [place], LCSW will support the patient's ability to achieve the goals identified. will employ CBT, BA, Problem-solving, Solution Focused, Mindfulness,  coping skills, & other evidenced-based practices will be used to promote progress towards healthy functioning to help manage decrease symptoms associated with her diagnosis.   Reduce overall level, frequency, and intensity of the feelings of depression, anxiety and panic evidenced by  decreased her overall symptom from 6 to 7 days/week to 0 to 1 days/week per client report for at  least 3 consecutive months. Verbally express understanding of the relationship between feelings of depression, anxiety and their impact on thinking patterns and behaviors. Verbalize an understanding of the role that distorted thinking plays in creating fears, excessive worry, and ruminations.    Nancy Barajas participated in the creation of the treatment plan)    Nancy Ovens, LCSW

## 2022-06-20 ENCOUNTER — Ambulatory Visit: Payer: Medicare HMO | Attending: Internal Medicine | Admitting: Internal Medicine

## 2022-06-20 VITALS — BP 106/80 | HR 94 | Ht 71.0 in | Wt 196.0 lb

## 2022-06-20 DIAGNOSIS — Z79899 Other long term (current) drug therapy: Secondary | ICD-10-CM | POA: Insufficient documentation

## 2022-06-20 DIAGNOSIS — Z833 Family history of diabetes mellitus: Secondary | ICD-10-CM | POA: Insufficient documentation

## 2022-06-20 DIAGNOSIS — N183 Chronic kidney disease, stage 3 unspecified: Secondary | ICD-10-CM | POA: Insufficient documentation

## 2022-06-20 DIAGNOSIS — Z8249 Family history of ischemic heart disease and other diseases of the circulatory system: Secondary | ICD-10-CM | POA: Diagnosis not present

## 2022-06-20 DIAGNOSIS — I5022 Chronic systolic (congestive) heart failure: Secondary | ICD-10-CM | POA: Diagnosis not present

## 2022-06-20 DIAGNOSIS — G4733 Obstructive sleep apnea (adult) (pediatric): Secondary | ICD-10-CM | POA: Diagnosis not present

## 2022-06-20 DIAGNOSIS — I130 Hypertensive heart and chronic kidney disease with heart failure and stage 1 through stage 4 chronic kidney disease, or unspecified chronic kidney disease: Secondary | ICD-10-CM | POA: Insufficient documentation

## 2022-06-20 DIAGNOSIS — E1122 Type 2 diabetes mellitus with diabetic chronic kidney disease: Secondary | ICD-10-CM | POA: Insufficient documentation

## 2022-06-20 DIAGNOSIS — J383 Other diseases of vocal cords: Secondary | ICD-10-CM | POA: Insufficient documentation

## 2022-06-20 DIAGNOSIS — Z7985 Long-term (current) use of injectable non-insulin antidiabetic drugs: Secondary | ICD-10-CM | POA: Insufficient documentation

## 2022-06-20 DIAGNOSIS — Z7984 Long term (current) use of oral hypoglycemic drugs: Secondary | ICD-10-CM | POA: Insufficient documentation

## 2022-06-20 DIAGNOSIS — I447 Left bundle-branch block, unspecified: Secondary | ICD-10-CM | POA: Diagnosis not present

## 2022-06-20 DIAGNOSIS — I509 Heart failure, unspecified: Secondary | ICD-10-CM

## 2022-06-20 DIAGNOSIS — Z87891 Personal history of nicotine dependence: Secondary | ICD-10-CM | POA: Insufficient documentation

## 2022-06-20 DIAGNOSIS — I428 Other cardiomyopathies: Secondary | ICD-10-CM | POA: Insufficient documentation

## 2022-06-20 NOTE — Patient Instructions (Signed)
Medication Changes:  No medication changes.   Testing/Procedures:  Your physician has requested that you have a cardiac MRI. Cardiac MRI uses a computer to create images of your heart as its beating, producing both still and moving pictures of your heart and major blood vessels. For further information please visit [email or web address]. Please follow the instruction sheet given to you today for more information.  They will call you to schedule this appointment.  Special Instructions // Education:  Do the following things EVERYDAY: Weigh yourself in the morning before breakfast. Write it down and keep it in a log. Take your medicines as prescribed Eat low salt foods--Limit salt (sodium) to 2000 mg per day.  Stay as active as you can everyday Limit all fluids for the day to less than 2 liters   Follow-Up in: follow in 4 months with Dr. Gala Romney. Please call in September to schedule this appointment.     If you have any questions or concerns before your next appointment please send [place] a message through [place] or call our office at [phone] Monday-Friday 8 am-5 pm.   If you have an urgent need after hours on the weekend please call your Primary Cardiologist or the [facility] in [place] at [phone].

## 2022-06-20 NOTE — Telephone Encounter (Signed)
Schedule still not opened for July. Will continue to hold

## 2022-06-20 NOTE — Progress Notes (Signed)
ADVANCED HF CLINIC CONSULT NOTE  Referring Physician: Debbe Odea, MD Primary Care: Dana Allan, MD Primary Cardiologist: Debbe Odea, MD  HPI:  Nancy Barajas is a 62 y.o. female with a hx of HTN, systolic HF due to NICM, LBBB, severe OSA, DM2, CKD stage III, severe anxiety/depression and spastic dysphonia/vocal cord dysfunction who is referred by Dr. Azucena Cecil for further evaluation of her HF.    Onset of HF in 3/21. Echo 03/20/2019 EF 25-30%. Cath 4/21 no CAD. ECG LBBB 158 ms  Echo 10/2020 EF 50-55%  Echo 4/24 EF 45-50%  Now on disability. Struggling with spastic dysphonia which makes her SOB with mild exertion. Can go to the store and do ADLs. No edema, orthopnea or PND. No CP. Doesn't wear CPAP due to spastic dysphonia   Review of Systems: [y] = yes, [ ]  = no   General: Weight gain [ ] ; Weight loss [ ] ; Anorexia [ ] ; Fatigue [ ] ; Fever [ ] ; Chills [ ] ; Weakness [ ]   Cardiac: Chest pain/pressure [ ] ; Resting SOB [ ] ; Exertional SOB [ y]; Orthopnea [ ] ; Pedal Edema [ ] ; Palpitations [ ] ; Syncope [ ] ; Presyncope [ ] ; Paroxysmal nocturnal dyspnea[ ]   Pulmonary: Cough [ ] ; Wheezing[ y]; Hemoptysis[ ] ; Sputum [ ] ; Snoring [ ]   GI: Vomiting[ ] ; Dysphagia[ ] ; Melena[ ] ; Hematochezia [ ] ; Heartburn[y ]; Abdominal pain [ ] ; Constipation [ ] ; Diarrhea [ ] ; BRBPR [ ]   GU: Hematuria[ ] ; Dysuria [ ] ; Nocturia[ ]   Vascular: Pain in legs with walking [ ] ; Pain in feet with lying flat [ ] ; Non-healing sores [ ] ; Stroke [ ] ; TIA [ ] ; Slurred speech [ ] ;  Neuro: Headaches[ ] ; Vertigo[ ] ; Seizures[ ] ; Paresthesias[ ] ;Blurred vision [ ] ; Diplopia [ ] ; Vision changes [ ]   Ortho/Skin: Arthritis [ ] ; Joint pain [ ] ; Muscle pain [ ] ; Joint swelling [ ] ; Back Pain [ ] ; Rash [ ]   Psych: Depression[ y]; Anxiety[y ]  Heme: Bleeding problems [ ] ; Clotting disorders [ ] ; Anemia [ ]   Endocrine: Diabetes [ ] ; Thyroid dysfunction[ ]    Past Medical History:  Diagnosis Date   Anxiety     Asthma    Depression    Diabetes mellitus without complication (HCC)    History of kidney stones    Hypertension    Kidney stones    Vertigo    when laying flat   Vocal cord dysfunction    makes it hard for patient to talk and breathe    Current Outpatient Medications  Medication Sig Dispense Refill   amLODipine (NORVASC) 5 MG tablet Take 1 tablet (5 mg total) by mouth daily. 90 tablet 3   blood glucose meter kit and supplies Dispense based on patient and insurance preference. Use twice daily as directed. (FOR ICD-10 E10.9, E11.9). 1 each 0   carvedilol (COREG) 25 MG tablet Take 1 tablet (25 mg total) by mouth 2 (two) times daily with a meal. 180 tablet 3   Cholecalciferol (VITAMIN D-3) 125 MCG (5000 UT) TABS Take 1 tablet by mouth daily. 90 tablet 3   Dulaglutide (TRULICITY) 1.5 MG/0.5ML SOPN Inject 1.5 mg into the skin once a week. 6 mL 5   FARXIGA 10 MG TABS tablet Take 1 tablet (10 mg total) by mouth daily. 90 tablet 3   glucose blood (ONETOUCH ULTRA) test strip USE TO CHECK BLOOD SUGAR TWICE DAILY AS DIRECTED 200 each 3   Lancets (ONETOUCH ULTRASOFT) lancets Use as instructed bid 200  each 3   Lancets MISC 1 Device by Does not apply route in the morning, at noon, and at bedtime. Dispense for onetouch ultra 2 machine ONLY 300 each 11   LORazepam (ATIVAN) 0.5 MG tablet Take 0.5 mg by mouth daily.     montelukast (SINGULAIR) 10 MG tablet Take 1 tablet (10 mg total) by mouth daily. 90 tablet 3   Multiple Vitamins-Minerals (CENTRUM SILVER 50+WOMEN PO) Take 1 tablet by mouth daily.     rosuvastatin (CRESTOR) 20 MG tablet Take 1 tablet (20 mg total) by mouth daily. 90 tablet 3   sacubitril-valsartan (ENTRESTO) 97-103 MG Take 1 tablet by mouth 2 (two) times daily. 60 tablet 3   sodium bicarbonate 650 MG tablet Take 1 tablet (650 mg total) by mouth 2 (two) times daily. 60 tablet 11   sodium chloride (OCEAN) 0.65 % SOLN nasal spray Place 1 spray into both nostrils as needed for congestion.       spironolactone (ALDACTONE) 25 MG tablet Take 1 tablet (25 mg total) by mouth daily. 90 tablet 3   COVID-19 mRNA bivalent vaccine, Pfizer, (PFIZER COVID-19 VAC BIVALENT) injection Inject into the muscle. (Patient not taking: Reported on 06/20/2022) 0.3 mL 1   No current facility-administered medications for this visit.   Facility-Administered Medications Ordered in Other Visits  Medication Dose Route Frequency Provider Last Rate Last Admin   albuterol (PROVENTIL) (2.5 MG/3ML) 0.083% nebulizer solution 2.5 mg  2.5 mg Nebulization Once Salena Saner, MD        Allergies  Allergen Reactions   Penicillins Rash    Did it involve swelling of the face/tongue/throat, SOB, or low BP? No Did it involve sudden or severe rash/hives, skin peeling, or any reaction on the inside of your mouth or nose? No Did you need to seek medical attention at a hospital or doctor's office? No When did it last happen?      Childhood If all above answers are "NO", may proceed with cephalosporin use.      Social History   Socioeconomic History   Marital status: Divorced    Spouse name: Not on file   Number of children: 2   Years of education: Not on file   Highest education level: 9th grade  Occupational History   Not on file  Tobacco Use   Smoking status: Former    Packs/day: 1.00    Years: 10.00    Additional pack years: 0.00    Total pack years: 10.00    Types: Cigarettes    Quit date: 01/03/1998    Years since quitting: 24.4   Smokeless tobacco: Never  Vaping Use   Vaping Use: Never used  Substance and Sexual Activity   Alcohol use: Not Currently   Drug use: Not Currently    Types: Marijuana    Comment: years ago   Sexual activity: Not Currently  Other Topics Concern   Not on file  Social History Narrative   Lives at home    2 daughters as of 04/04/19 5 y.o daughter lives with her (she works)   Patient is on disability.   Social Determinants of Health   Financial Resource Strain: Low  Risk  (01/06/2021)   Overall Financial Resource Strain (CARDIA)    Difficulty of Paying Living Expenses: Not hard at all  Food Insecurity: Food Insecurity Present (02/03/2020)   Hunger Vital Sign    Worried About Running Out of Food in the Last Year: Sometimes true    Ran Out  of Food in the Last Year: Sometimes true  Transportation Needs: No Transportation Needs (02/15/2021)   PRAPARE - Administrator, Civil Service (Medical): No    Lack of Transportation (Non-Medical): No  Physical Activity: Unknown (01/15/2019)   Exercise Vital Sign    Days of Exercise per Week: 0 days    Minutes of Exercise per Session: Not on file  Stress: Stress Concern Present (03/19/2020)   [employer] of Occupational Health - Occupational Stress Questionnaire    Feeling of Stress : To some extent  Social Connections: Unknown (02/15/2021)   Social Connection and Isolation Panel [NHANES]    Frequency of Communication with Friends and Family: Three times a week    Frequency of Social Gatherings with Friends and Family: More than three times a week    Attends Religious Services: Never    Database administrator or Organizations: Yes    Attends Banker Meetings: Never    Marital Status: Not on file  Intimate Partner Violence: Not At Risk (02/15/2021)   Humiliation, Afraid, Rape, and Kick questionnaire    Fear of Current or Ex-Partner: No    Emotionally Abused: No    Physically Abused: No    Sexually Abused: No      Family History  Problem Relation Age of Onset   Hypertension Mother    Diabetes Mother    Hypertension Father    Diabetes Father    Breast cancer Neg Hx     Vitals:   06/20/22 1041  BP: 106/80  Pulse: 94  SpO2: 99%  Weight: 196 lb (88.9 kg)  Height: 5\' 11"  (1.803 m)    PHYSICAL EXAM: General:  Well appearing. No respiratory difficulty + spastic dysphonia HEENT: normal Neck: supple. no JVD. Carotids 2+ bilat; no bruits. No lymphadenopathy or thryomegaly  appreciated. Cor: PMI nondisplaced. Regular rate & rhythm. No rubs, gallops or murmurs. Lungs: clear Abdomen: soft, nontender, nondistended. No hepatosplenomegaly. No bruits or masses. Good bowel sounds. Extremities: no cyanosis, clubbing, rash, edema Neuro: alert & oriented x 3, cranial nerves grossly intact. moves all 4 extremities w/o difficulty. Affect pleasant.  ECG: NSR 96 + LBBB ( ) Personally reviewed    ASSESSMENT & PLAN:  1. Chronic HF with mildly reduced EF (NICM) - Onset of HF in 3/21. Echo 03/20/2019 EF 25-30%.  - Cath 4/21 no CAD.  - Echo 10/2020 EF 50-55% - Echo 4/24 EF 45-50% - Etiology ? LBBB - NYHA II-III seems mainly limited by spastic dysphonia - Volume status ok - Continue Farxiga 10 - Continue Entresto 97/1103 bid - Continue spiro 25  - Continue carvedilol 25 bid - Plan cMRI - If EF drops or has worsening HF can consider CRT  2. HTN - Blood pressure well controlled. Continue current regimen.  3. Severe OSA c/p vocal cord dysfunction/spastic dysphonia  - AHI 32.1, oxygen desaturations to 85 percent during sleep  - Followed by Pulmonary - Unable to tolerate CPAP due to spastic dysphonia  4. Spastic dysphonia - per primary  Arvilla Meres, MD  11:15 AM

## 2022-06-23 NOTE — Telephone Encounter (Signed)
Lm for patient to schedule OV.  

## 2022-06-24 NOTE — Telephone Encounter (Signed)
Spoke to patient and scheduled appt 08/25/2022 at 2:00. Nothing further needed.

## 2022-06-27 ENCOUNTER — Other Ambulatory Visit: Payer: Self-pay | Admitting: Psychiatry

## 2022-06-27 ENCOUNTER — Telehealth: Payer: Self-pay

## 2022-06-27 MED ORDER — LORAZEPAM 0.5 MG PO TABS: 0.5000 mg | ORAL_TABLET | Freq: Every day | ORAL | 0 refills | Status: AC | PRN

## 2022-06-27 NOTE — Telephone Encounter (Signed)
received fax requesting a refill on the lorazepam .5mg . pt last seen on 3-25 next appt 7-2

## 2022-06-27 NOTE — Telephone Encounter (Signed)
Ordered

## 2022-06-27 NOTE — Telephone Encounter (Signed)
Pt.notified
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
## 2022-07-05 ENCOUNTER — Ambulatory Visit: Payer: Medicare HMO | Admitting: Psychiatry
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
## 2022-07-12 ENCOUNTER — Encounter: Payer: Self-pay | Admitting: Psychiatry

## 2022-07-12 ENCOUNTER — Ambulatory Visit: Payer: Medicare HMO | Admitting: Psychiatry

## 2022-07-12 VITALS — BP 138/87 | HR 101 | Temp 98.8°F | Ht 71.0 in | Wt 196.6 lb

## 2022-07-12 DIAGNOSIS — F959 Tic disorder, unspecified: Secondary | ICD-10-CM

## 2022-07-12 DIAGNOSIS — F330 Major depressive disorder, recurrent, mild: Secondary | ICD-10-CM

## 2022-07-12 DIAGNOSIS — F4310 Post-traumatic stress disorder, unspecified: Secondary | ICD-10-CM

## 2022-07-12 DIAGNOSIS — F411 Generalized anxiety disorder: Secondary | ICD-10-CM | POA: Diagnosis not present

## 2022-07-14 ENCOUNTER — Ambulatory Visit: Payer: Medicare HMO | Admitting: Psychology

## 2022-07-14 DIAGNOSIS — F4310 Post-traumatic stress disorder, unspecified: Secondary | ICD-10-CM

## 2022-07-14 DIAGNOSIS — F330 Major depressive disorder, recurrent, mild: Secondary | ICD-10-CM

## 2022-07-14 NOTE — Progress Notes (Signed)
[facility] Counselor/Therapist Progress Note  Patient ID: Clema Skousen, MRN: [id number]   Date: 07/14/22  Time Spent: 1:06 pm - 1:59 pm :  Treatment Type: Individual Therapy.  Reported Symptoms: anxiety and depression  Mental Status Exam: Appearance:  Casual     Behavior: Appropriate  Motor: Normal  Speech/Language:  Clear and Coherent  Affect: Appropriate  Mood: anxious and dysthymic  Thought process: normal  Thought content:   WNL  Sensory/Perceptual disturbances:   WNL  Orientation: oriented to person, place, time/date, and situation  Attention: Good  Concentration: Good  Memory: WNL  Fund of knowledge:  Good  Insight:   Good  Judgment:  Good  Impulse Control: Good   Risk Assessment: Danger to Self:  No Self-injurious Behavior: No Danger to Others: No Duty to Warn:no Physical Aggression / Violence:No  Access to Firearms a concern: No  Gang Involvement:No   Subjective:   [name] participated from home, via video, is aware of the limitations of tele-sessions, and consented to treatment. Therapist participated from home office.  Colleen reviewed the events of the past week. [name] noted having a recent psychiatric appointment and noted no medication changes. She noted not following up with resources from previous appointment and noted this was "procrastination".  She noted barriers including a lack of access to a laptop and forgetting her email password. We worked on identifying ways to address the concerns employing problem-solving. She noted being recommended to go to a neurologist to address her concerns regarding her spasms. She noted a barrier to making said call is her VCD and not being able to communicate effectively. She noted worry that she would "have these spasms the rest of my life". She noted discomfort sharing during her prayer line due to worry of how others might perceive her. She noted people often looking at her when  she has the spasms and noted feeling judged.  We explored this during the session. Therapist highlighted jumps to conclusion and generalizing by [name] in regards to how she interprets people's behavior. We worked on exploring this and noted that [name] has a friend who has not been supportive of [name]'s health. We worked on looking for evidence that people are judging & critiquing her that is tangible and to be mindful of emotional thinking/reasoning. Therapist modeled ways to challenge this going forward. [name] was engaged and motivated. She expressed commitment towards our goals. Therapist praised [name] and provided supportive therapy. A follow-up was scheduled for continued treatment.    Interventions: CBT  Diagnosis:  MDD (major depressive disorder), recurrent episode, mild (HCC)  PTSD (post-traumatic stress disorder)  Psychiatric Treatment: Yes , Azucena Cecil, MD   Treatment Plan:  Client Abilities/Strengths Tywanna is motivated and self-aware.   Support System: [employer] OPT  Client Statement of Needs Jamiria would like to better manage her anxiety, discontinue her lorazapam prescription, and become more social.   Treatment Level Weekly  Symptoms  Depression: loss of interest, feeling down, difficulty sleeping, fluctuating appetite, difficulty concentrating.   (Status: maintained) Anxiety: feeling anxious, difficulty controlling worry, worrying about different things, trouble relaxing, and feeling awful as if something might happen.    (Status: maintained)  Goals:   Roselyne experiences symptoms of anxiety and depression.    Target Date: 11/19/22 Frequency: Weekly  Progress: 0 Modality: individual    Therapist will provide referrals for additional resources as appropriate.  Therapist will provide psycho-education regarding Rogina's diagnosis and corresponding treatment approaches and interventions. Licensed  Clinical Social Worker,  Wiley, LCSW will support the patient's ability to achieve the goals identified. will employ CBT, BA, Problem-solving, Solution Focused, Mindfulness,  coping skills, & other evidenced-based practices will be used to promote progress towards healthy functioning to help manage decrease symptoms associated with her diagnosis.   Reduce overall level, frequency, and intensity of the feelings of depression, anxiety and panic evidenced by decreased her overall symptom from 6 to 7 days/week to 0 to 1 days/week per client report for at least 3 consecutive months. Verbally express understanding of the relationship between feelings of depression, anxiety and their impact on thinking patterns and behaviors. Verbalize an understanding of the role that distorted thinking plays in creating fears, excessive worry, and ruminations.    [name] participated in the creation of the treatment plan)    Delight Ovens, LCSW

## 2022-07-21 ENCOUNTER — Encounter: Payer: Medicare HMO | Admitting: Family Medicine

## 2022-07-29 ENCOUNTER — Encounter: Payer: Self-pay | Admitting: *Deleted

## 2022-08-03 ENCOUNTER — Encounter (INDEPENDENT_AMBULATORY_CARE_PROVIDER_SITE_OTHER): Payer: Self-pay

## 2022-08-04 ENCOUNTER — Encounter: Payer: Self-pay | Admitting: *Deleted

## 2022-08-05 ENCOUNTER — Ambulatory Visit
Admission: RE | Admit: 2022-08-05 | Discharge: 2022-08-05 | Disposition: A | Discharge status: Home / Self Care | Payer: Medicare HMO | Attending: Gastroenterology | Admitting: Gastroenterology

## 2022-08-05 ENCOUNTER — Ambulatory Visit: Payer: Medicare HMO | Admitting: Anesthesiology

## 2022-08-05 ENCOUNTER — Other Ambulatory Visit: Payer: Self-pay

## 2022-08-05 ENCOUNTER — Other Ambulatory Visit: Payer: Self-pay | Admitting: Gastroenterology

## 2022-08-05 ENCOUNTER — Encounter: Payer: Self-pay | Admitting: *Deleted

## 2022-08-05 ENCOUNTER — Encounter
Admission: RE | Disposition: A | Discharge status: Home / Self Care | Payer: Self-pay | Source: Home / Self Care | Attending: Gastroenterology

## 2022-08-05 DIAGNOSIS — Z8379 Family history of other diseases of the digestive system: Secondary | ICD-10-CM | POA: Diagnosis not present

## 2022-08-05 DIAGNOSIS — N183 Chronic kidney disease, stage 3 unspecified: Secondary | ICD-10-CM | POA: Diagnosis not present

## 2022-08-05 DIAGNOSIS — R195 Other fecal abnormalities: Secondary | ICD-10-CM | POA: Insufficient documentation

## 2022-08-05 DIAGNOSIS — Z87891 Personal history of nicotine dependence: Secondary | ICD-10-CM | POA: Diagnosis not present

## 2022-08-05 DIAGNOSIS — I130 Hypertensive heart and chronic kidney disease with heart failure and stage 1 through stage 4 chronic kidney disease, or unspecified chronic kidney disease: Secondary | ICD-10-CM | POA: Diagnosis not present

## 2022-08-05 DIAGNOSIS — Z1211 Encounter for screening for malignant neoplasm of colon: Secondary | ICD-10-CM | POA: Diagnosis not present

## 2022-08-05 DIAGNOSIS — K640 First degree hemorrhoids: Secondary | ICD-10-CM | POA: Diagnosis not present

## 2022-08-05 DIAGNOSIS — E1122 Type 2 diabetes mellitus with diabetic chronic kidney disease: Secondary | ICD-10-CM | POA: Diagnosis not present

## 2022-08-05 DIAGNOSIS — D128 Benign neoplasm of rectum: Secondary | ICD-10-CM | POA: Diagnosis not present

## 2022-08-05 DIAGNOSIS — K635 Polyp of colon: Secondary | ICD-10-CM | POA: Diagnosis not present

## 2022-08-05 DIAGNOSIS — I5020 Unspecified systolic (congestive) heart failure: Secondary | ICD-10-CM | POA: Diagnosis not present

## 2022-08-05 DIAGNOSIS — D122 Benign neoplasm of ascending colon: Secondary | ICD-10-CM | POA: Diagnosis not present

## 2022-08-05 DIAGNOSIS — D123 Benign neoplasm of transverse colon: Secondary | ICD-10-CM | POA: Diagnosis not present

## 2022-08-05 HISTORY — PX: COLONOSCOPY WITH PROPOFOL: SHX5780

## 2022-08-05 HISTORY — PX: POLYPECTOMY: SHX5525

## 2022-08-05 HISTORY — PX: HEMOSTASIS CLIP PLACEMENT: SHX6857

## 2022-08-05 HISTORY — DX: Sleep apnea, unspecified: G47.30

## 2022-08-05 LAB — GLUCOSE, CAPILLARY: Glucose-Capillary: 136 mg/dL — ABNORMAL HIGH (ref 70–99)

## 2022-08-05 SURGERY — COLONOSCOPY WITH PROPOFOL
Anesthesia: General

## 2022-08-05 MED ORDER — PROPOFOL 10 MG/ML IV BOLUS
INTRAVENOUS | Status: DC | PRN
  Administered 2022-08-05 (×6): 20 mg via INTRAVENOUS
  Administered 2022-08-05: 40 mg via INTRAVENOUS
  Administered 2022-08-05 (×2): 20 mg via INTRAVENOUS
  Administered 2022-08-05: 30 mg via INTRAVENOUS
  Administered 2022-08-05 (×8): 20 mg via INTRAVENOUS

## 2022-08-05 MED ORDER — EPHEDRINE SULFATE (PRESSORS) 50 MG/ML IJ SOLN
INTRAMUSCULAR | Status: DC | PRN
  Administered 2022-08-05: 5 mg via INTRAVENOUS

## 2022-08-05 MED ORDER — PROPOFOL 10 MG/ML IV BOLUS
INTRAVENOUS | Status: AC
  Filled 2022-08-05: qty 40

## 2022-08-05 MED ORDER — SODIUM CHLORIDE 0.9 % IV SOLN: INTRAVENOUS | Status: DC

## 2022-08-05 MED ORDER — LIDOCAINE HCL (PF) 2 % IJ SOLN
INTRAMUSCULAR | Status: DC | PRN
  Administered 2022-08-05: 40 mg via INTRADERMAL

## 2022-08-05 NOTE — Transfer of Care (Signed)
Immediate Anesthesia Transfer of Care Note  Patient: Nancy Barajas  Procedure(s) Performed: COLONOSCOPY WITH PROPOFOL POLYPECTOMY HEMOSTASIS CLIP PLACEMENT  Patient Location: PACU and Endoscopy Unit  Anesthesia Type:MAC  Level of Consciousness: drowsy  Airway & Oxygen Therapy: Patient Spontanous Breathing and Patient connected to nasal cannula oxygen  Post-op Assessment: Report given to RN and Post -op Vital signs reviewed and stable  Post vital signs: Reviewed and stable  Last Vitals:  Vitals Value Taken Time  BP 109/74 08/05/22 1233  Temp    Pulse    Resp 39 08/05/22 1231  SpO2 95 % 08/05/22 1231    Last Pain:  Vitals:   08/05/22 1231  TempSrc:   PainSc: Asleep         Complications: No notable events documented.

## 2022-08-05 NOTE — H&P (Signed)
Outpatient short stay form Pre-procedure 08/05/2022  Regis Bill, MD  Primary Physician: Dana Allan, MD  Reason for visit:  Cologuard positive  History of present illness:    62 y/o lady with hypertension, DM II, and sleep apnea here for colonoscopy for cologuard positive test. It was positive in 2021 but cancelled original colonoscopy. No blood thinners. Brother with stomach "mass" of unknown type. No significant abdominal surgeries.     Current Facility-Administered Medications:    0.9 %  sodium chloride infusion, , Intravenous, Continuous, , Rossie Muskrat, MD  Facility-Administered Medications Ordered in Other Encounters:    albuterol (PROVENTIL) (2.5 MG/3ML) 0.083% nebulizer solution 2.5 mg, 2.5 mg, Nebulization, Once, Salena Saner, MD  Medications Prior to Admission  Medication Sig Dispense Refill Last Dose   amLODipine (NORVASC) 5 MG tablet Take 1 tablet (5 mg total) by mouth daily. 90 tablet 3 08/04/2022   blood glucose meter kit and supplies Dispense based on patient and insurance preference. Use twice daily as directed. (FOR ICD-10 E10.9, E11.9). 1 each 0 08/04/2022   carvedilol (COREG) 25 MG tablet Take 1 tablet (25 mg total) by mouth 2 (two) times daily with a meal. 180 tablet 3 08/04/2022   Cholecalciferol (VITAMIN D-3) 125 MCG (5000 UT) TABS Take 1 tablet by mouth daily. 90 tablet 3 Past Week   COVID-19 mRNA bivalent vaccine, Pfizer, (PFIZER COVID-19 VAC BIVALENT) injection Inject into the muscle. 0.3 mL 1 Past Month   Dulaglutide (TRULICITY) 1.5 MG/0.5ML SOPN Inject 1.5 mg into the skin once a week. 6 mL 5 07/22/2022   FARXIGA 10 MG TABS tablet Take 1 tablet (10 mg total) by mouth daily. 90 tablet 3 08/04/2022   glucose blood (ONETOUCH ULTRA) test strip USE TO CHECK BLOOD SUGAR TWICE DAILY AS DIRECTED 200 each 3 08/04/2022   Lancets (ONETOUCH ULTRASOFT) lancets Use as instructed bid 200 each 3 08/04/2022   Lancets MISC 1 Device by Does not apply route in the morning,  at noon, and at bedtime. Dispense for onetouch ultra 2 machine ONLY 300 each 11 08/04/2022   montelukast (SINGULAIR) 10 MG tablet Take 1 tablet (10 mg total) by mouth daily. 90 tablet 3 08/04/2022   Multiple Vitamins-Minerals (CENTRUM SILVER 50+WOMEN PO) Take 1 tablet by mouth daily.   08/04/2022   rosuvastatin (CRESTOR) 20 MG tablet Take 1 tablet (20 mg total) by mouth daily. 90 tablet 3 08/04/2022   sacubitril-valsartan (ENTRESTO) 97-103 MG Take 1 tablet by mouth 2 (two) times daily. 60 tablet 3 08/04/2022   sodium bicarbonate 650 MG tablet Take 1 tablet (650 mg total) by mouth 2 (two) times daily. 60 tablet 11 08/04/2022   sodium chloride (OCEAN) 0.65 % SOLN nasal spray Place 1 spray into both nostrils as needed for congestion.    08/04/2022   spironolactone (ALDACTONE) 25 MG tablet Take 1 tablet (25 mg total) by mouth daily. 90 tablet 3 08/04/2022     Allergies  Allergen Reactions   Penicillins Rash    Did it involve swelling of the face/tongue/throat, SOB, or low BP? No Did it involve sudden or severe rash/hives, skin peeling, or any reaction on the inside of your mouth or nose? No Did you need to seek medical attention at a hospital or doctor's office? No When did it last happen?      Childhood If all above answers are "NO", may proceed with cephalosporin use.     Past Medical History:  Diagnosis Date   Anxiety    Asthma  Depression    Diabetes mellitus without complication (HCC)    History of kidney stones    Hypertension    Kidney stones    Sleep apnea    Vertigo    when laying flat   Vocal cord dysfunction    makes it hard for patient to talk and breathe    Review of systems:  Otherwise negative.    Physical Exam  Gen: Alert, oriented. Appears stated age.  HEENT: PERRLA. Lungs: No respiratory distress CV: RRR Abd: soft, benign, no masses Ext: No edema    Planned procedures: Proceed with colonoscopy. The patient understands the nature of the planned procedure,  indications, risks, alternatives and potential complications including but not limited to bleeding, infection, perforation, damage to internal organs and possible oversedation/side effects from anesthesia. The patient agrees and gives consent to proceed.  Please refer to procedure notes for findings, recommendations and patient disposition/instructions.     Regis Bill, MD [facility]

## 2022-08-05 NOTE — Anesthesia Postprocedure Evaluation (Signed)
Anesthesia Post Note  Patient: Nancy Barajas  Procedure(s) Performed: COLONOSCOPY WITH PROPOFOL POLYPECTOMY HEMOSTASIS CLIP PLACEMENT  Patient location during evaluation: PACU Anesthesia Type: General Level of consciousness: awake and alert, oriented and patient cooperative Pain management: pain level controlled Vital Signs Assessment: post-procedure vital signs reviewed and stable Respiratory status: spontaneous breathing, nonlabored ventilation and respiratory function stable Cardiovascular status: blood pressure returned to baseline and stable Postop Assessment: adequate PO intake Anesthetic complications: no   No notable events documented.   Last Vitals:  Vitals:   08/05/22 1243 08/05/22 1253  BP: 123/76 (!) 127/98  Pulse: 89 98  Resp: 16 20  Temp:    SpO2: 99% 99%    Last Pain:  Vitals:   08/05/22 1253  TempSrc:   PainSc: 0-No pain                 Reed Breech

## 2022-08-05 NOTE — Interval H&P Note (Signed)
History and Physical Interval Note:  08/05/2022 11:41 AM  Nancy Barajas  has presented today for surgery, with the diagnosis of + Cologuard.  The various methods of treatment have been discussed with the patient and family. After consideration of risks, benefits and other options for treatment, the patient has consented to  Procedure(s): COLONOSCOPY WITH PROPOFOL (N/A) as a surgical intervention.  The patient's history has been reviewed, patient examined, no change in status, stable for surgery.  I have reviewed the patient's chart and labs.  Questions were answered to the patient's satisfaction.     Regis Bill  Ok to proceed with colonoscopy

## 2022-08-05 NOTE — Anesthesia Preprocedure Evaluation (Addendum)
Anesthesia Evaluation  Patient identified by MRN, date of birth, ID band Patient awake    Reviewed: Allergy & Precautions, NPO status , Patient's Chart, lab work & pertinent test results  History of Anesthesia Complications Negative for: history of anesthetic complications  Airway Mallampati: IV   Neck ROM: Full    Dental  (+) Missing, Loose   Pulmonary former smoker (quit 2000)   Pulmonary exam normal breath sounds clear to auscultation       Cardiovascular hypertension, +CHF (NICM, EF 45-50%)  Normal cardiovascular exam Rhythm:Regular Rate:Normal  ECG 06/20/22:  Normal sinus rhythm Left bundle branch block   Neuro/Psych  PSYCHIATRIC DISORDERS (PTSD) Anxiety Depression    Vertigo   Neuromuscular disease (vocal cord dysfunction)    GI/Hepatic hiatal hernia,,,  Endo/Other  diabetes, Type 2    Renal/GU Renal disease (stage III CKD; nephrolithiasis)     Musculoskeletal   Abdominal   Peds  Hematology negative hematology ROS (+)   Anesthesia Other Findings Last dose of Trulicity greater than 2 weeks ago.   Cardiology note 06/20/22:  1. Chronic HF with mildly reduced EF (NICM) - Onset of HF in 3/21. Echo 03/20/2019 EF 25-30%.  - Cath 4/21 no CAD.  - Echo 10/2020 EF 50-55% - Echo 4/24 EF 45-50% - Etiology ? LBBB - NYHA II-III seems mainly limited by spastic dysphonia - Volume status ok - Continue Farxiga 10 - Continue Entresto 97/1103 bid - Continue spiro 25  - Continue carvedilol 25 bid - Plan cMRI - If EF drops or has worsening HF can consider CRT   2. HTN - Blood pressure well controlled. Continue current regimen.   3. Severe OSA c/p vocal cord dysfunction/spastic dysphonia  - AHI 32.1, oxygen desaturations to 85 percent during sleep  - Followed by Pulmonary - Unable to tolerate CPAP due to spastic dysphonia   4. Spastic dysphonia - per primary   Reproductive/Obstetrics                              Anesthesia Physical Anesthesia Plan  ASA: 3  Anesthesia Plan: General   Post-op Pain Management:    Induction: Intravenous  PONV Risk Score and Plan: 3 and Propofol infusion, TIVA and Treatment may vary due to age or medical condition  Airway Management Planned: Natural Airway  Additional Equipment:   Intra-op Plan:   Post-operative Plan:   Informed Consent: I have reviewed the patients History and Physical, chart, labs and discussed the procedure including the risks, benefits and alternatives for the proposed anesthesia with the patient or authorized representative who has indicated his/her understanding and acceptance.       Plan Discussed with: CRNA  Anesthesia Plan Comments: (LMA/GETA backup discussed.  Patient consented for risks of anesthesia including but not limited to:  - adverse reactions to medications - damage to eyes, teeth, lips or other oral mucosa - nerve damage due to positioning  - sore throat or hoarseness - damage to heart, brain, nerves, lungs, other parts of body or loss of life  Informed patient about role of CRNA in peri- and intra-operative care.  Patient voiced understanding.)        Anesthesia Quick Evaluation

## 2022-08-05 NOTE — Op Note (Signed)
[facility]astroenter[facility] Patient Name: Nancy Barajas Procedure Date: 08/05/2022 11:52 AM MRN: [id number] Account #: [id number] Date of Birth: 1960-11-11 Admit Type: Outpatient Age: 62 Room: [facility] ENDO ROOM 3 Gender: Female Note Status: Finalized Instrument Name: Prentice Docker [id number] Procedure:             Colonoscopy Indications:           Positive Cologuard test Providers:             Eather Colas MD, MD Medicines:             Monitored Anesthesia Care Complications:         No immediate complications. Estimated blood loss:                         Minimal. Procedure:             Pre-Anesthesia Assessment:                        - Prior to the procedure, a History and Physical was                         performed, and patient medications and allergies were                         reviewed. The patient is competent. The risks and                         benefits of the procedure and the sedation options and                         risks were discussed with the patient. All questions                         were answered and informed consent was obtained.                         Patient identification and proposed procedure were                         verified by the physician, the nurse, the                         anesthesiologist, the anesthetist and the technician                         in the endoscopy suite. Mental Status Examination:                         alert and oriented. Airway Examination: normal                         oropharyngeal airway and neck mobility. Respiratory                         Examination: clear to auscultation. CV Examination:                         normal. Prophylactic Antibiotics: The patient does not  require prophylactic antibiotics. Prior                         Anticoagulants: The patient has taken no anticoagulant                         or antiplatelet agents. ASA Grade Assessment: III - A                          patient with severe systemic disease. After reviewing                         the risks and benefits, the patient was deemed in                         satisfactory condition to undergo the procedure. The                         anesthesia plan was to use monitored anesthesia care                         (MAC). Immediately prior to administration of                         medications, the patient was re-assessed for adequacy                         to receive sedatives. The heart rate, respiratory                         rate, oxygen saturations, blood pressure, adequacy of                         pulmonary ventilation, and response to care were                         monitored throughout the procedure. The physical                         status of the patient was re-assessed after the                         procedure.                        After obtaining informed consent, the colonoscope was                         passed under direct vision. Throughout the procedure,                         the patient's blood pressure, pulse, and oxygen                         saturations were monitored continuously. The                         Colonoscope was introduced through the anus and  advanced to the the cecum, identified by appendiceal                         orifice and ileocecal valve. The colonoscopy was                         somewhat difficult due to significant looping.                         Successful completion of the procedure was aided by                         applying abdominal pressure. The patient tolerated the                         procedure well. The quality of the bowel preparation                         was good. The ileocecal valve, appendiceal orifice,                         and rectum were photographed. Findings:      The perianal and digital rectal examinations were normal.      An 11 mm polyp was found in the  ascending colon. The polyp was sessile.       The polyp was removed with a hot snare. Resection and retrieval were       complete. To prevent bleeding after the polypectomy, one hemostatic clip       was successfully placed. There was no bleeding during, or at the end, of       the procedure.      A 17 mm polyp was found in the transverse colon. The polyp was       semi-pedunculated. The polyp was removed with a hot snare. Resection and       retrieval were complete. To prevent bleeding post-intervention, one       hemostatic clip was successfully placed. There was no bleeding during,       or at the end, of the procedure.      A 3 mm polyp was found in the rectum. The polyp was sessile. The polyp       was removed with a cold snare. Resection and retrieval were complete.       Estimated blood loss was minimal.      Internal hemorrhoids were found during retroflexion. The hemorrhoids       were Grade I (internal hemorrhoids that do not prolapse).      The exam was otherwise without abnormality on direct and retroflexion       views. Impression:            - One 11 mm polyp in the ascending colon, removed with                         a hot snare. Resected and retrieved. Clip was placed.                        - One 17 mm polyp in the transverse colon, removed  with a hot snare. Resected and retrieved. Clip was                         placed.                        - One 3 mm polyp in the rectum, removed with a cold                         snare. Resected and retrieved.                        - Internal hemorrhoids.                        - The examination was otherwise normal on direct and                         retroflexion views. Recommendation:        - Discharge patient to home.                        - Resume previous diet.                        - Continue present medications.                        - Await pathology results.                        - Repeat  colonoscopy in 3 years for surveillance.                        - Return to referring physician as previously                         scheduled. Procedure Code(s):     --- Professional ---                        [id number], Colonoscopy, flexible; with removal of                         tumor(s), polyp(s), or other lesion(s) by snare                         technique Diagnosis Code(s):     --- Professional ---                        D12.2, Benign neoplasm of ascending colon                        D12.3, Benign neoplasm of transverse colon (hepatic                         flexure or splenic flexure)                        D12.8, Benign neoplasm of rectum  K64.0, First degree hemorrhoids                        R19.5, Other fecal abnormalities CPT copyright 2022 American Medical Association. All rights reserved. The codes documented in this report are preliminary and upon coder review may  be revised to meet current compliance requirements. Eather Colas MD, MD 08/05/2022 12:34:17 PM Number of Addenda: 0 Note Initiated On: 08/05/2022 11:52 AM Scope Withdrawal Time: 0 hours 19 minutes 43 seconds  Total Procedure Duration: 0 hours 30 minutes 40 seconds  Estimated Blood Loss:  Estimated blood loss was minimal.      [facility]

## 2022-08-06 NOTE — Progress Notes (Signed)
Non-identified Voicemail.  No Message Left. 

## 2022-08-08 ENCOUNTER — Encounter: Payer: Self-pay | Admitting: Gastroenterology

## 2022-08-09 ENCOUNTER — Telehealth: Payer: Self-pay | Admitting: Cardiology

## 2022-08-09 DIAGNOSIS — I428 Other cardiomyopathies: Secondary | ICD-10-CM

## 2022-08-09 MED ORDER — ENTRESTO 97-103 MG PO TABS
1.0000 | ORAL_TABLET | Freq: Two times a day (BID) | ORAL | 5 refills | Status: DC
Start: 2022-08-09 — End: 2023-01-25

## 2022-08-09 NOTE — Telephone Encounter (Signed)
*  STAT* If patient is at the pharmacy, call can be transferred to refill team.   1. Which medications need to be refilled? (please list name of each medication and dose if known) Entresto   2. Would you like to learn more about the convenience, safety, & potential cost savings by using the [facility]?   3. Are you open to using the [facility] (Type [facility].   Which pharmacy/location (including street and city if local pharmacy) is medication to be sent to? [facility] [address], [place]    5. Do they need a 30 day or 90 day supply? # 60 and refills-please call today- completely out

## 2022-08-11 ENCOUNTER — Ambulatory Visit: Payer: Medicare HMO | Admitting: Psychology

## 2022-08-11 ENCOUNTER — Other Ambulatory Visit: Payer: Self-pay

## 2022-08-11 DIAGNOSIS — I428 Other cardiomyopathies: Secondary | ICD-10-CM

## 2022-08-11 NOTE — Telephone Encounter (Signed)
LOV 08/24/2021 NOV 09/01/2022

## 2022-08-17 DIAGNOSIS — E1129 Type 2 diabetes mellitus with other diabetic kidney complication: Secondary | ICD-10-CM | POA: Diagnosis not present

## 2022-08-17 DIAGNOSIS — N1831 Chronic kidney disease, stage 3a: Secondary | ICD-10-CM | POA: Diagnosis not present

## 2022-08-17 DIAGNOSIS — N2581 Secondary hyperparathyroidism of renal origin: Secondary | ICD-10-CM | POA: Diagnosis not present

## 2022-08-17 DIAGNOSIS — I129 Hypertensive chronic kidney disease with stage 1 through stage 4 chronic kidney disease, or unspecified chronic kidney disease: Secondary | ICD-10-CM | POA: Diagnosis not present

## 2022-08-17 DIAGNOSIS — D631 Anemia in chronic kidney disease: Secondary | ICD-10-CM | POA: Diagnosis not present

## 2022-08-17 DIAGNOSIS — R809 Proteinuria, unspecified: Secondary | ICD-10-CM | POA: Diagnosis not present

## 2022-08-19 NOTE — Telephone Encounter (Signed)
Error

## 2022-08-22 ENCOUNTER — Encounter (HOSPITAL_COMMUNITY): Payer: Self-pay

## 2022-08-22 NOTE — Telephone Encounter (Signed)
Sent to cardiology

## 2022-08-23 MED ORDER — SPIRONOLACTONE 25 MG PO TABS
25.0000 mg | ORAL_TABLET | Freq: Every day | ORAL | 3 refills | Status: DC
Start: 2022-08-23 — End: 2023-08-18

## 2022-08-24 ENCOUNTER — Ambulatory Visit
Admission: RE | Admit: 2022-08-24 | Discharge: 2022-08-24 | Disposition: A | Discharge status: Home / Self Care | Payer: Medicare HMO | Source: Ambulatory Visit | Attending: Internal Medicine | Admitting: Internal Medicine

## 2022-08-24 DIAGNOSIS — I509 Heart failure, unspecified: Secondary | ICD-10-CM

## 2022-08-24 MED ORDER — GADOBUTROL 1 MMOL/ML IV SOLN: 11.0000 mL | Freq: Once | INTRAVENOUS | Status: DC | PRN

## 2022-08-25 ENCOUNTER — Ambulatory Visit: Payer: Medicare HMO | Admitting: Pulmonary Disease

## 2022-08-25 ENCOUNTER — Encounter (HOSPITAL_COMMUNITY): Payer: Self-pay

## 2022-08-26 ENCOUNTER — Ambulatory Visit (INDEPENDENT_AMBULATORY_CARE_PROVIDER_SITE_OTHER): Payer: Medicare HMO | Admitting: Psychology

## 2022-08-26 DIAGNOSIS — F4310 Post-traumatic stress disorder, unspecified: Secondary | ICD-10-CM | POA: Diagnosis not present

## 2022-08-26 DIAGNOSIS — F330 Major depressive disorder, recurrent, mild: Secondary | ICD-10-CM

## 2022-08-26 NOTE — Progress Notes (Signed)
[facility] Counselor/Therapist Progress Note  Patient ID: Nancy Barajas, MRN: [id number]   Date: 08/26/22  Time Spent: 11:04 am - 11:47pm : 43 Minutes  Treatment Type: Individual Therapy.  Reported Symptoms: anxiety and depression  Mental Status Exam: Appearance:  Casual     Behavior: Appropriate  Motor: Normal  Speech/Language:  Clear and Coherent  Affect: Appropriate  Mood: anxious and dysthymic  Thought process: normal  Thought content:   WNL  Sensory/Perceptual disturbances:   WNL  Orientation: oriented to person, place, time/date, and situation  Attention: Good  Concentration: Good  Memory: WNL  Fund of knowledge:  Good  Insight:   Good  Judgment:  Good  Impulse Control: Good   Risk Assessment: Danger to Self:  No Self-injurious Behavior: No Danger to Others: No Duty to Warn:no Physical Aggression / Violence:No  Access to Firearms a concern: No  Gang Involvement:No   Subjective:   Nancy Barajas participated from home, via video, is aware of the limitations of tele-sessions, and consented to treatment. Therapist participated from home office.  Nancy Barajas reviewed the events of the past week. Nancy Barajas noted having an MRI and being unable to complete the task. She noted being scheduled for another MRI, Monday, and noted feeling anxious about this. She noted her attempts to manage via deep breathing, to no avail. She noted attempting to take her lorazepam and that can help but only "a little bit". She described her experience during the MRI and noted it being a "big scary machine" and noted her spasms beginning and feeling suffocated. She noted not being able to complete an MRI in the past couple of years due to her claustrophobia and vocal spasms. She noted experiencing spasms leads her to believe that she cannot breath and that she is suffocating. We discussed various interventions to manage her anxiety via challenging negative self-talk, thinking  positively, using distraction, having someone sit with her. She noted a lack of control during this time and heightening discomfort as time passes. Therapist encouraged Nancy Barajas to employ aforementioned tools and skills.  Therapist provided supportive therapy and a follow was scheduled for continued treatment.    Interventions: CBT  Diagnosis:  MDD (major depressive disorder), recurrent episode, mild (HCC)  PTSD (post-traumatic stress disorder)  Psychiatric Treatment: Yes , Azucena Cecil, MD   Treatment Plan:  Client Abilities/Strengths Nancy Barajas is motivated and self-aware.   Support System: [employer] OPT  Client Statement of Needs Nancy Barajas would like to better manage her anxiety, discontinue her lorazapam prescription, and become more social.   Treatment Level Weekly  Symptoms  Depression: loss of interest, feeling down, difficulty sleeping, fluctuating appetite, difficulty concentrating.   (Status: maintained) Anxiety: feeling anxious, difficulty controlling worry, worrying about different things, trouble relaxing, and feeling awful as if something might happen.    (Status: maintained)  Goals:   Merna experiences symptoms of anxiety and depression.    Target Date: 11/19/22 Frequency: Weekly  Progress: 0 Modality: individual    Therapist will provide referrals for additional resources as appropriate.  Therapist will provide psycho-education regarding Darsi's diagnosis and corresponding treatment approaches and interventions. Licensed Clinical Social Worker, [place], LCSW will support the patient's ability to achieve the goals identified. will employ CBT, BA, Problem-solving, Solution Focused, Mindfulness,  coping skills, & other evidenced-based practices will be used to promote progress towards healthy functioning to help manage decrease symptoms associated with her diagnosis.   Reduce overall level, frequency, and intensity of  the  feelings of depression, anxiety and panic evidenced by decreased her overall symptom from 6 to 7 days/week to 0 to 1 days/week per client report for at least 3 consecutive months. Verbally express understanding of the relationship between feelings of depression, anxiety and their impact on thinking patterns and behaviors. Verbalize an understanding of the role that distorted thinking plays in creating fears, excessive worry, and ruminations.    Nancy Barajas participated in the creation of the treatment plan)    Delight Ovens, LCSW
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
## 2022-08-29 ENCOUNTER — Ambulatory Visit: Payer: Medicare HMO

## 2022-08-30 ENCOUNTER — Ambulatory Visit: Payer: Medicare HMO | Admitting: Psychiatry

## 2022-09-01 ENCOUNTER — Ambulatory Visit (INDEPENDENT_AMBULATORY_CARE_PROVIDER_SITE_OTHER): Payer: Medicare HMO | Admitting: Family Medicine

## 2022-09-01 ENCOUNTER — Encounter: Payer: Self-pay | Admitting: Family Medicine

## 2022-09-01 VITALS — BP 102/70 | HR 100 | Temp 97.9°F | Resp 16 | Ht 71.0 in | Wt 194.0 lb

## 2022-09-01 DIAGNOSIS — N183 Chronic kidney disease, stage 3 unspecified: Secondary | ICD-10-CM | POA: Diagnosis not present

## 2022-09-01 DIAGNOSIS — E782 Mixed hyperlipidemia: Secondary | ICD-10-CM

## 2022-09-01 DIAGNOSIS — E1122 Type 2 diabetes mellitus with diabetic chronic kidney disease: Secondary | ICD-10-CM

## 2022-09-01 DIAGNOSIS — I5020 Unspecified systolic (congestive) heart failure: Secondary | ICD-10-CM | POA: Diagnosis not present

## 2022-09-01 DIAGNOSIS — J385 Laryngeal spasm: Secondary | ICD-10-CM | POA: Diagnosis not present

## 2022-09-01 DIAGNOSIS — Z1231 Encounter for screening mammogram for malignant neoplasm of breast: Secondary | ICD-10-CM

## 2022-09-01 DIAGNOSIS — E1159 Type 2 diabetes mellitus with other circulatory complications: Secondary | ICD-10-CM

## 2022-09-01 DIAGNOSIS — Z0100 Encounter for examination of eyes and vision without abnormal findings: Secondary | ICD-10-CM

## 2022-09-01 DIAGNOSIS — I152 Hypertension secondary to endocrine disorders: Secondary | ICD-10-CM | POA: Diagnosis not present

## 2022-09-01 DIAGNOSIS — E118 Type 2 diabetes mellitus with unspecified complications: Secondary | ICD-10-CM

## 2022-09-01 DIAGNOSIS — Z7985 Long-term (current) use of injectable non-insulin antidiabetic drugs: Secondary | ICD-10-CM | POA: Diagnosis not present

## 2022-09-01 DIAGNOSIS — F39 Unspecified mood [affective] disorder: Secondary | ICD-10-CM

## 2022-09-01 DIAGNOSIS — I700 Atherosclerosis of aorta: Secondary | ICD-10-CM | POA: Diagnosis not present

## 2022-09-01 LAB — COMPREHENSIVE METABOLIC PANEL
ALT: 17 U/L (ref 0–35)
AST: 15 U/L (ref 0–37)
Albumin: 4.1 g/dL (ref 3.5–5.2)
Alkaline Phosphatase: 62 U/L (ref 39–117)
BUN: 34 mg/dL — ABNORMAL HIGH (ref 6–23)
CO2: 31 mEq/L (ref 19–32)
Calcium: 10.1 mg/dL (ref 8.4–10.5)
Chloride: 97 mEq/L (ref 96–112)
Creatinine, Ser: 1.28 mg/dL — ABNORMAL HIGH (ref 0.40–1.20)
GFR: 44.8 mL/min — ABNORMAL LOW (ref >60.00)
Glucose, Bld: 148 mg/dL — ABNORMAL HIGH (ref 70–99)
Potassium: 5 mEq/L (ref 3.5–5.1)
Sodium: 135 mEq/L (ref 135–145)
Total Bilirubin: 0.8 mg/dL (ref 0.2–1.2)
Total Protein: 7.5 g/dL (ref 6.0–8.3)

## 2022-09-01 LAB — MICROALBUMIN / CREATININE URINE RATIO
Creatinine,U: 81.2 mg/dL
Microalb Creat Ratio: 1.1 mg/g (ref 0.0–30.0)
Microalb, Ur: 0.9 mg/dL (ref 0.0–1.9)

## 2022-09-01 LAB — HEMOGLOBIN A1C: Hgb A1c MFr Bld: 7.4 % — ABNORMAL HIGH (ref 4.6–6.5)

## 2022-09-01 NOTE — Progress Notes (Signed)
SUBJECTIVE:   Chief Complaint  Patient presents with   Establish Care   HPI Presents to clinic to transfer care  No acute concerns  HTN Asymptomatic.  Takes Amlodipine 5 mg daily, Coreg 25 mg BID.    HFrEF Asymptomatic.  Follows with Cardiology.  Takes Entresto 97-103 mg daily, Aldactone 25 mg daily, Carvedilol 25 mg BID.    HLD Currently taking Crestor 20 mg daily and tolerating well. Follows with Cardiology  Mood Disorder Follows with Dr Golda Acre.  Taking Ativan 0.5 mg daily as needed.    DM Type 2 Asymptomatic.  Does not check CBG at home.  Denies any polyuria, polydipsia or polyphagia.  Taking Trulicity 1.5 mg weekly and tolerating well.    CKD stage 3a Follows with Nephrology.  Taking sodium bicarbonate 650 mg BID, Entresto, Trulicity and Aldactone.   Vocal cord dysfunction Currently taking Gabapentin 100 mg TID.  Follows with Neurology  PERTINENT PMH / PSH: HTN DM T 2 HLD HFrEF Vocal cord dysfunction  OBJECTIVE:  BP 102/70   Pulse 100   Temp 97.9 F (36.6 C)   Resp 16   Ht 5\' 11"  (1.803 m)   Wt 194 lb (88 kg)   LMP 01/03/2013   SpO2 98%   BMI 27.06 kg/m    Physical Exam Vitals reviewed.  Constitutional:      General: She is not in acute distress.    Appearance: She is not ill-appearing.  HENT:     Head: Normocephalic.     Right Ear: Tympanic membrane, ear canal and external ear normal.     Left Ear: Tympanic membrane, ear canal and external ear normal.     Nose: Nose normal.     Mouth/Throat:     Mouth: Mucous membranes are moist.  Eyes:     Extraocular Movements: Extraocular movements intact.     Conjunctiva/sclera: Conjunctivae normal.     Pupils: Pupils are equal, round, and reactive to light.  Neck:     Thyroid: No thyromegaly or thyroid tenderness.     Vascular: No carotid bruit.  Cardiovascular:     Rate and Rhythm: Normal rate and regular rhythm.     Pulses: Normal pulses.     Heart sounds: Normal heart sounds.  Pulmonary:      Effort: Pulmonary effort is normal.     Breath sounds: Normal breath sounds.  Abdominal:     General: Bowel sounds are normal. There is no distension.     Palpations: Abdomen is soft.     Tenderness: There is no abdominal tenderness. There is no right CVA tenderness, left CVA tenderness, guarding or rebound.  Musculoskeletal:        General: Normal range of motion.     Cervical back: Normal range of motion.     Right lower leg: No edema.     Left lower leg: No edema.  Lymphadenopathy:     Cervical: No cervical adenopathy.  Skin:    Capillary Refill: Capillary refill takes less than 2 seconds.  Neurological:     General: No focal deficit present.     Mental Status: She is alert and oriented to person, place, and time. Mental status is at baseline.     Motor: No weakness.  Psychiatric:        Mood and Affect: Mood normal.        Behavior: Behavior normal.        Thought Content: Thought content normal.  Judgment: Judgment normal.        09/14/2022    1:53 PM 09/01/2022   10:48 AM 03/28/2022    1:29 PM 01/27/2022   11:34 AM 11/23/2021    1:28 PM  Depression screen PHQ 2/9  Decreased Interest 1 2     Down, Depressed, Hopeless 1 2     PHQ - 2 Score 2 4     Altered sleeping 1 2     Tired, decreased energy 1 2     Change in appetite 1 1     Feeling bad or failure about yourself  1 2     Trouble concentrating 0 1     Moving slowly or fidgety/restless 0 0     Suicidal thoughts 0 0     PHQ-9 Score 6 12     Difficult doing work/chores Somewhat difficult Somewhat difficult        Information is confidential and restricted. Go to Review Flowsheets to unlock data.      09/01/2022   10:48 AM 03/28/2022    1:29 PM 01/27/2022   11:34 AM 11/23/2021    1:29 PM  GAD 7 : Generalized Anxiety Score  Nervous, Anxious, on Edge 2     Control/stop worrying 1     Worry too much - different things 1     Trouble relaxing 1     Restless 0     Easily annoyed or irritable 0      Afraid - awful might happen 1     Total GAD 7 Score 6     Anxiety Difficulty Somewhat difficult        Information is confidential and restricted. Go to Review Flowsheets to unlock data.    ASSESSMENT/PLAN:  Encounter for screening mammogram for malignant neoplasm of breast -     3D Screening Mammogram, Left and Right; Future  Type 2 diabetes mellitus with complications (HCC) Assessment & Plan: Chronic Continue Trulicity 1.5 mg weekly.  Continue Farxiga 10 mg daily Recommend modifying diet and increase activity Check A1c, UACR Ophthalmology referral sent Foot exam at next visit On statin, ARB combo  Orders: -     Hemoglobin A1c -     Microalbumin / creatinine urine ratio  Diabetic eye exam (HCC) -     Ambulatory referral to Ophthalmology  Hypertension associated with diabetes [facility]) Assessment & Plan: Chronic Well controlled Continue Carvedilol 25 mg BID Continue Norvasc 5 mg daily Cmet today  Orders: -     Comprehensive metabolic panel  Mixed hyperlipidemia Assessment & Plan: Chronic.  Recent LDL at goal Continue Crestor 20 mg daily Follows with Cardiology   Laryngeal spasm Assessment & Plan: Recently started gabapentin for vocal cord dysfunction  Follow up with Neurology   CKD stage 3 secondary to diabetes [facility]) Assessment & Plan: Chronic On Farxiga 10 mg daily, Sodium Bicarbonate 650 mg BID and Aldactone 25 mg daily Follows with Nephrology    Aortic atherosclerosis (HCC) Assessment & Plan: Chronic Continue daily statin   Mood disorder (HCC) Assessment & Plan: Chronic Taking Ativan 0.5 mg daily as needed Follows with Psychiatry   Heart failure with reduced ejection fraction (HCC) Assessment & Plan: Chronic Euvolemic on exam On Entresto, Carvedilol, Trulicity, Aldactone, Farxiga Follow up with Cardiology    PDMP reviewed  Return in about 3 months (around 12/02/2022) for PCP.  Dana Allan, MD

## 2022-09-01 NOTE — Patient Instructions (Addendum)
It was a pleasure meeting you today. Thank you for allowing me to take part in your health care.  Our goals for today as we discussed include:  We will get some labs today.  If they are abnormal or we need to do something about them, I will call you.  If they are normal, I will send you a message on MyChart (if it is active) or a letter in the mail.  If you don't hear from [place] in 2 weeks, please call the office at the number below.   Follow up with Dr Christell Constant at [facility] or Dr Clelia Croft at [place] [facility]  Recommend annual eye exam  Schedule Pap at your earliest convenience  Foot exam at next visit  These are the goals we discussed:  Goals       Medication Monitoring (pt-stated)      Patient Goals/Self-Care Activities Over the next 90 days, patient will:  - take medications as prescribed check glucose twice daily, document, and provide at future appointments. check blood pressure daily, document, and provide at future appointments.        This is a list of the screening recommended for you and due dates:  Health Maintenance  Topic Date Due   Zoster (Shingles) Vaccine (1 of 2) Never done   Eye exam for diabetics  01/12/2021   Pap Smear  02/05/2021   Mammogram  03/23/2021   COVID-19 Vaccine (5 - 2023-24 season) 09/03/2021   Medicare Annual Wellness Visit  02/15/2022   Hemoglobin A1C  02/24/2022   Yearly kidney function blood test for diabetes  08/25/2022   Yearly kidney health urinalysis for diabetes  08/25/2022   Complete foot exam   08/25/2022   Flu Shot  04/03/2023*   DTaP/Tdap/Td vaccine (2 - Td or Tdap) 02/12/2031   Colon Cancer Screening  08/04/2032   Hepatitis C Screening  Completed   HIV Screening  Completed   HPV Vaccine  Aged Out  *Topic was postponed. The date shown is not the original due date.    Follow up 3 months  If you have any questions or concerns, please do not hesitate to call the office at [phone].  I look forward to our next  visit and until then take care and stay safe.  Regards,   Dana Allan, MD   [facility]

## 2022-09-06 ENCOUNTER — Ambulatory Visit: Payer: Medicare HMO

## 2022-09-06 ENCOUNTER — Telehealth: Payer: Self-pay | Admitting: Family Medicine

## 2022-09-06 NOTE — Telephone Encounter (Signed)
Lm to call office to schedule office appointment with Dr Clent Ridges to discuss patient's labs.

## 2022-09-07 ENCOUNTER — Encounter: Payer: Self-pay | Admitting: Family Medicine

## 2022-09-07 ENCOUNTER — Telehealth (INDEPENDENT_AMBULATORY_CARE_PROVIDER_SITE_OTHER): Payer: Medicare HMO | Admitting: Family Medicine

## 2022-09-07 VITALS — BP 119/77 | HR 90 | Resp 16 | Ht 71.0 in | Wt 194.0 lb

## 2022-09-07 DIAGNOSIS — E1122 Type 2 diabetes mellitus with diabetic chronic kidney disease: Secondary | ICD-10-CM

## 2022-09-07 DIAGNOSIS — E118 Type 2 diabetes mellitus with unspecified complications: Secondary | ICD-10-CM

## 2022-09-07 DIAGNOSIS — N183 Chronic kidney disease, stage 3 unspecified: Secondary | ICD-10-CM

## 2022-09-07 NOTE — Assessment & Plan Note (Addendum)
Chronic Recent GFR 44 On Farxiga 10 mg daily Follows with Nephrology Has upcoming appointment scheduled for renal ultrasound for evaluation kidney stones

## 2022-09-07 NOTE — Progress Notes (Signed)
Virtual Visit via Video note  I connected with Nancy Barajas on 09/07/22 at 1622 by video and verified that I am speaking with the correct person using two identifiers. Nancy Barajas is currently located at home and daughter Nancy Barajas 27 yrs,  is currently with her during visit. The provider, Dana Allan, MD is located in their office at time of visit.  I discussed the limitations, risks, security and privacy concerns of performing an evaluation and management service by video and the availability of in person appointments. I also discussed with the patient that there may be a patient responsible charge related to this service. The patient expressed understanding and agreed to proceed.  Subjective: PCP: Dana Allan, MD  Chief Complaint  Patient presents with   Abnormal Lab    Abnormal Lab   Discussed recent labs  CKD stage 3b Follows with Nephrology Has appointment with Urology and is scheduled for renal u/s tomorrow Currently on Farxiga   DM Type 2 Asymptomatic. Recent A1c 7.4 CBG at home 80's-128 in am Takes Trulicity 1.5 mg weekly.  Not much appetite with current dose. Also recently started Farxiga 10 mg daily    ROS: Per HPI  Current Outpatient Medications:    amLODipine (NORVASC) 5 MG tablet, Take 1 tablet (5 mg total) by mouth daily., Disp: 90 tablet, Rfl: 3   blood glucose meter kit and supplies, Dispense based on patient and insurance preference. Use twice daily as directed. (FOR ICD-10 E10.9, E11.9)., Disp: 1 each, Rfl: 0   carvedilol (COREG) 25 MG tablet, Take 1 tablet (25 mg total) by mouth 2 (two) times daily with a meal., Disp: 180 tablet, Rfl: 3   Cholecalciferol (VITAMIN D-3) 125 MCG (5000 UT) TABS, Take 1 tablet by mouth daily., Disp: 90 tablet, Rfl: 3   Dulaglutide (TRULICITY) 1.5 MG/0.5ML SOPN, Inject 1.5 mg into the skin once a week., Disp: 6 mL, Rfl: 5   FARXIGA 10 MG TABS tablet, Take 1 tablet (10 mg total) by mouth daily., Disp: 90 tablet,  Rfl: 3   gabapentin (NEURONTIN) 100 MG capsule, Take 100 mg by mouth 3 (three) times daily., Disp: , Rfl:    glucose blood (ONETOUCH ULTRA) test strip, USE TO CHECK BLOOD SUGAR TWICE DAILY AS DIRECTED, Disp: 200 each, Rfl: 3   Lancets (ONETOUCH ULTRASOFT) lancets, Use as instructed bid, Disp: 200 each, Rfl: 3   Lancets MISC, 1 Device by Does not apply route in the morning, at noon, and at bedtime. Dispense for onetouch ultra 2 machine ONLY, Disp: 300 each, Rfl: 11   LORazepam (ATIVAN) 0.5 MG tablet, TAKE 1 TABLET BY MOUTH ONCE DAILY AS NEEDED FOR ANXIETY, Disp: 30 tablet, Rfl: 0   montelukast (SINGULAIR) 10 MG tablet, Take 1 tablet (10 mg total) by mouth daily., Disp: 90 tablet, Rfl: 3   Multiple Vitamins-Minerals (CENTRUM SILVER 50+WOMEN PO), Take 1 tablet by mouth daily., Disp: , Rfl:    rosuvastatin (CRESTOR) 20 MG tablet, Take 1 tablet (20 mg total) by mouth daily., Disp: 90 tablet, Rfl: 3   sacubitril-valsartan (ENTRESTO) 97-103 MG, Take 1 tablet by mouth 2 (two) times daily., Disp: 60 tablet, Rfl: 5   sodium bicarbonate 650 MG tablet, Take 1 tablet (650 mg total) by mouth 2 (two) times daily., Disp: 60 tablet, Rfl: 11   sodium chloride (OCEAN) 0.65 % SOLN nasal spray, Place 1 spray into both nostrils as needed for congestion. , Disp: , Rfl:    spironolactone (ALDACTONE) 25 MG tablet, Take  1 tablet (25 mg total) by mouth daily., Disp: 90 tablet, Rfl: 3 No current facility-administered medications for this visit.  Facility-Administered Medications Ordered in Other Visits:    albuterol (PROVENTIL) (2.5 MG/3ML) 0.083% nebulizer solution 2.5 mg, 2.5 mg, Nebulization, Once, Salena Saner, MD  Observations/Objective: Physical Exam Pulmonary:     Effort: Pulmonary effort is normal.  Neurological:     Mental Status: She is alert and oriented to person, place, and time. Mental status is at baseline.  Psychiatric:        Mood and Affect: Mood normal.        Behavior: Behavior normal.         Thought Content: Thought content normal.        Judgment: Judgment normal.    Assessment and Plan: Type 2 diabetes mellitus with complications (HCC) Assessment & Plan: Chronic A1c 7.4 Continue Trulicity 1.5 mg weekly. Had planned to increase but patient reports appetite already poor and blood glucose at home 80's-120's Continue Farxiga 10 mg daily Recommend modifying diet and increase activity Follow up in 3 months   CKD stage 3 secondary to diabetes [facility]) Assessment & Plan: Chronic Recent GFR 44 On Farxiga 10 mg daily Follows with Nephrology Has upcoming appointment scheduled for renal ultrasound for evaluation kidney stones     Follow Up Instructions: Return in about 3 months (around 12/07/2022) for PCP.   I discussed the assessment and treatment plan with the patient. The patient was provided an opportunity to ask questions and all were answered. The patient agreed with the plan and demonstrated an understanding of the instructions.   The patient was advised to call back or seek an in-person evaluation if the symptoms worsen or if the condition fails to improve as anticipated.  The above assessment and management plan was discussed with the patient. The patient verbalized understanding of and has agreed to the management plan. Patient is aware to call the clinic if symptoms persist or worsen. Patient is aware when to return to the clinic for a follow-up visit. Patient educated on when it is appropriate to go to the emergency department.     Dana Allan, MD

## 2022-09-07 NOTE — Patient Instructions (Addendum)
It was a pleasure meeting you today. Thank you for allowing me to take part in your health care.  Our goals for today as we discussed include:  A1c 7.4 Continue Trulicity 1.5 mg weekly Recommend monitoring diet and if no improvement will add medication at next visit.  Follow up in 3 months   If you have any questions or concerns, please do not hesitate to call the office at [phone].  I look forward to our next visit and until then take care and stay safe.  Regards,   Dana Allan, MD   [facility]

## 2022-09-07 NOTE — Assessment & Plan Note (Signed)
Chronic A1c 7.4 Continue Trulicity 1.5 mg weekly. Had planned to increase but patient reports appetite already poor and blood glucose at home 80's-120's Continue Farxiga 10 mg daily Recommend modifying diet and increase activity Follow up in 3 months

## 2022-09-08 ENCOUNTER — Ambulatory Visit
Admission: RE | Admit: 2022-09-08 | Discharge: 2022-09-08 | Disposition: A | Discharge status: Home / Self Care | Payer: Medicare HMO | Source: Ambulatory Visit | Attending: Urology | Admitting: Urology

## 2022-09-08 DIAGNOSIS — N281 Cyst of kidney, acquired: Secondary | ICD-10-CM | POA: Diagnosis not present

## 2022-09-08 DIAGNOSIS — N200 Calculus of kidney: Secondary | ICD-10-CM | POA: Insufficient documentation

## 2022-09-14 ENCOUNTER — Ambulatory Visit (INDEPENDENT_AMBULATORY_CARE_PROVIDER_SITE_OTHER): Payer: Medicare HMO | Admitting: *Deleted

## 2022-09-14 VITALS — BP 133/81 | HR 94 | Ht 71.0 in | Wt 191.0 lb

## 2022-09-14 DIAGNOSIS — Z0000 Encounter for general adult medical examination without abnormal findings: Secondary | ICD-10-CM | POA: Diagnosis not present

## 2022-09-14 NOTE — Progress Notes (Signed)
Subjective:   Nancy Barajas is a 62 y.o. female who presents for Medicare Annual (Subsequent) preventive examination.  Visit Complete: Virtual  I connected with  Nancy Barajas on 09/14/22 by a audio enabled telemedicine application and verified that I am speaking with the correct person using two identifiers.  Patient Location: Home  Provider Location: Office/Clinic  I discussed the limitations of evaluation and management by telemedicine. The patient expressed understanding and agreed to proceed.  Vital Signs: Unable to obtain new vitals due to this being a telehealth visit. Patient was able to provide some vital signs which has been documented.  Review of Systems     Cardiac Risk Factors include: advanced age (>30men, >34 women);diabetes mellitus;dyslipidemia;hypertension     Objective:    Today's Vitals   09/14/22 1343  BP: 133/81  Pulse: 94  Weight: 191 lb (86.6 kg)  Height: 5\' 11"  (1.803 m)   Body mass index is 26.64 kg/m.     09/14/2022    2:01 PM 08/05/2022   11:25 AM 02/15/2021    4:03 PM 02/03/2020   12:44 PM 12/11/2019    3:22 PM 09/27/2019    2:16 PM 08/22/2019   11:05 AM  Advanced Directives  Does Patient Have a Medical Advance Directive? No No No No No No No  Would patient like information on creating a medical advance directive? No - Patient declined  No - Patient declined No - Patient declined Yes (MAU/Ambulatory/Procedural Areas - Information given) No - Patient declined     Current Medications (verified) Outpatient Encounter Medications as of 09/14/2022  Medication Sig   amLODipine (NORVASC) 5 MG tablet Take 1 tablet (5 mg total) by mouth daily.   blood glucose meter kit and supplies Dispense based on patient and insurance preference. Use twice daily as directed. (FOR ICD-10 E10.9, E11.9).   carvedilol (COREG) 25 MG tablet Take 1 tablet (25 mg total) by mouth 2 (two) times daily with a meal.   Cholecalciferol (VITAMIN D-3) 125 MCG (5000 UT)  TABS Take 1 tablet by mouth daily.   Dulaglutide (TRULICITY) 1.5 MG/0.5ML SOPN Inject 1.5 mg into the skin once a week.   FARXIGA 10 MG TABS tablet Take 1 tablet (10 mg total) by mouth daily.   glucose blood (ONETOUCH ULTRA) test strip USE TO CHECK BLOOD SUGAR TWICE DAILY AS DIRECTED   Lancets (ONETOUCH ULTRASOFT) lancets Use as instructed bid   Lancets MISC 1 Device by Does not apply route in the morning, at noon, and at bedtime. Dispense for onetouch ultra 2 machine ONLY   LORazepam (ATIVAN) 0.5 MG tablet TAKE 1 TABLET BY MOUTH ONCE DAILY AS NEEDED FOR ANXIETY   montelukast (SINGULAIR) 10 MG tablet Take 1 tablet (10 mg total) by mouth daily.   Multiple Vitamins-Minerals (CENTRUM SILVER 50+WOMEN PO) Take 1 tablet by mouth daily.   rosuvastatin (CRESTOR) 20 MG tablet Take 1 tablet (20 mg total) by mouth daily.   sacubitril-valsartan (ENTRESTO) 97-103 MG Take 1 tablet by mouth 2 (two) times daily.   sodium bicarbonate 650 MG tablet Take 1 tablet (650 mg total) by mouth 2 (two) times daily.   sodium chloride (OCEAN) 0.65 % SOLN nasal spray Place 1 spray into both nostrils as needed for congestion.    spironolactone (ALDACTONE) 25 MG tablet Take 1 tablet (25 mg total) by mouth daily.   gabapentin (NEURONTIN) 100 MG capsule Take 100 mg by mouth 3 (three) times daily. (Patient not taking: Reported on 09/14/2022)   Facility-Administered  Encounter Medications as of 09/14/2022  Medication   albuterol (PROVENTIL) (2.5 MG/3ML) 0.083% nebulizer solution 2.5 mg    Allergies (verified) Penicillins   History: Past Medical History:  Diagnosis Date   Anxiety    Asthma    Depression    Diabetes mellitus without complication (HCC)    History of kidney stones    Hypertension    Kidney stones    Sleep apnea    Vertigo    when laying flat   Vocal cord dysfunction    makes it hard for patient to talk and breathe   Past Surgical History:  Procedure Laterality Date   CARDIAC CATHETERIZATION      COLONOSCOPY WITH PROPOFOL N/A 08/05/2022   Procedure: COLONOSCOPY WITH PROPOFOL;  Surgeon: Regis Bill, MD;  Location: [facility] ENDOSCOPY;  Service: Endoscopy;  Laterality: N/A;   CYSTOSCOPY W/ RETROGRADES Right 09/27/2019   Procedure: CYSTOSCOPY WITH RETROGRADE PYELOGRAM;  Surgeon: Sondra Come, MD;  Location: [facility] ORS;  Service: Urology;  Laterality: Right;   CYSTOSCOPY/URETEROSCOPY/HOLMIUM LASER/STENT PLACEMENT Left 09/27/2019   Procedure: CYSTOSCOPY/URETEROSCOPY/HOLMIUM LASER/STENT PLACEMENT;  Surgeon: Sondra Come, MD;  Location: [facility] ORS;  Service: Urology;  Laterality: Left;   HEMOSTASIS CLIP PLACEMENT  08/05/2022   Procedure: HEMOSTASIS CLIP PLACEMENT;  Surgeon: Regis Bill, MD;  Location: [facility] ENDOSCOPY;  Service: Endoscopy;;   LITHOTRIPSY     POLYPECTOMY  08/05/2022   Procedure: POLYPECTOMY;  Surgeon: Regis Bill, MD;  Location: [facility] ENDOSCOPY;  Service: Endoscopy;;   RIGHT/LEFT HEART CATH AND CORONARY ANGIOGRAPHY Bilateral 04/08/2019   Procedure: RIGHT/LEFT HEART CATH AND CORONARY ANGIOGRAPHY;  Surgeon: Iran Ouch, MD;  Location: [facility] INVASIVE CV LAB;  Service: Cardiovascular;  Laterality: Bilateral;   Family History  Problem Relation Age of Onset   Hypertension Mother    Diabetes Mother    Hypertension Father    Diabetes Father    Breast cancer Neg Hx    Social History   Socioeconomic History   Marital status: Divorced    Spouse name: Not on file   Number of children: 2   Years of education: Not on file   Highest education level: 9th grade  Occupational History   Not on file  Tobacco Use   Smoking status: Former    Current packs/day: 0.00    Average packs/day: 1 pack/day for 10.0 years (10.0 ttl pk-yrs)    Types: Cigarettes    Start date: 01/04/1988    Quit date: 01/03/1998    Years since quitting: 24.7   Smokeless tobacco: Never  Vaping Use   Vaping status: Never Used  Substance and Sexual Activity   Alcohol use: Not Currently   Drug use:  Not Currently    Types: Marijuana    Comment: years ago   Sexual activity: Not Currently  Other Topics Concern   Not on file  Social History Narrative   Lives at home    2 daughters as of 04/04/19 49 y.o daughter lives with her (she works)   Patient is on disability.   Social Determinants of Health   Financial Resource Strain: Low Risk  (09/14/2022)   Overall Financial Resource Strain (CARDIA)    Difficulty of Paying Living Expenses: Not hard at all  Food Insecurity: No Food Insecurity (09/14/2022)   Hunger Vital Sign    Worried About Running Out of Food in the Last Year: Never true    Ran Out of Food in the Last Year: Never true  Transportation Needs: No Transportation  Needs (09/14/2022)   PRAPARE - Administrator, Civil Service (Medical): No    Lack of Transportation (Non-Medical): No  Physical Activity: Inactive (09/14/2022)   Exercise Vital Sign    Days of Exercise per Week: 0 days    Minutes of Exercise per Session: 0 min  Stress: Stress Concern Present (09/14/2022)   [employer] of Occupational Health - Occupational Stress Questionnaire    Feeling of Stress : To some extent  Social Connections: Moderately Isolated (09/14/2022)   Social Connection and Isolation Panel [NHANES]    Frequency of Communication with Friends and Family: Twice a week    Frequency of Social Gatherings with Friends and Family: More than three times a week    Attends Religious Services: Never    Database administrator or Organizations: Yes    Attends Engineer, structural: More than 4 times per year    Marital Status: Divorced    Tobacco Counseling Counseling given: Not Answered   Clinical Intake:  Pre-visit preparation completed: Yes  Pain : No/denies pain     BMI - recorded: 26.64 Nutritional Status: BMI 25 -29 Overweight Nutritional Risks: None Diabetes: Yes CBG done?: Yes (FBS 143) CBG resulted in Enter/ Edit results?: No Did pt. bring in CBG monitor from  home?: No  How often do you need to have someone help you when you read instructions, pamphlets, or other written materials from your doctor or pharmacy?: 1 - Never  Interpreter Needed?: No  Information entered by :: R. Amica Harron LPN   Activities of Daily Living    09/14/2022    1:48 PM  In your present state of health, do you have any difficulty performing the following activities:  Hearing? 0  Vision? 0  Comment readers  Difficulty concentrating or making decisions? 0  Walking or climbing stairs? 0  Dressing or bathing? 0  Doing errands, shopping? 0  Preparing Food and eating ? N  Using the Toilet? N  In the past six months, have you accidently leaked urine? Y  Comment occassionally  Do you have problems with loss of bowel control? N  Managing your Medications? N  Managing your Finances? N  Housekeeping or managing your Housekeeping? N    Patient Care Team: Dana Allan, MD as PCP - General (Family Medicine) Debbe Odea, MD as PCP - Cardiology (Cardiology) Salena Saner, MD as Consulting Physician (Pulmonary Disease) Lonell Face, MD as Consulting Physician (Neurology)  Indicate any recent Medical Services you may have received from other than [facility] providers in the past year (date may be approximate).     Assessment:   This is a routine wellness examination for [facility].  Hearing/Vision screen Hearing Screening - Comments:: No issues Vision Screening - Comments:: readers   Goals Addressed             This Visit's Progress    Patient Stated       Wants to try to walk some to improve health and change diet       Depression Screen    09/14/2022    1:53 PM 09/01/2022   10:48 AM 03/28/2022    1:29 PM 01/27/2022   11:34 AM 11/23/2021    1:28 PM 09/28/2021    4:42 PM 08/31/2021   10:34 AM  PHQ 2/9 Scores  PHQ - 2 Score 2 4 4 4 3 2 4   PHQ- 9 Score 6 12 13 12 9 8 11     Fall Risk  09/14/2022    1:50 PM 09/01/2022   10:48 AM 08/24/2021    3:09  PM 02/15/2021    3:48 PM 07/02/2020    2:57 PM  Fall Risk   Falls in the past year? 0 0 0 0 0  Number falls in past yr: 0 0 0 0 0  Injury with Fall? 0 0 0  0  Risk for fall due to : No Fall Risks No Fall Risks No Fall Risks    Follow up Falls prevention discussed;Falls evaluation completed Falls evaluation completed Falls evaluation completed Falls evaluation completed Falls evaluation completed    MEDICARE RISK AT HOME: Medicare Risk at Home Any stairs in or around the home?: No If so, are there any without handrails?: No Home free of loose throw rugs in walkways, pet beds, electrical cords, etc?: Yes Adequate lighting in your home to reduce risk of falls?: Yes Life alert?: No Use of a cane, walker or w/c?: No Grab bars in the bathroom?: No Shower chair or bench in shower?: No Elevated toilet seat or a handicapped toilet?: No  Cognitive Function:        09/14/2022    2:01 PM 02/15/2021    4:06 PM 01/15/2019    1:28 PM  6CIT Screen  What Year? 0 points 0 points 0 points  What month? 0 points 0 points 0 points  What time? 0 points 0 points 0 points  Count back from 20 0 points 0 points 0 points  Months in reverse 2 points 0 points 0 points  Repeat phrase 0 points 0 points 0 points  Total Score 2 points 0 points 0 points    Immunizations Immunization History  Administered Date(s) Administered   Influenza,inj,Quad PF,6+ Mos 10/10/2017, 11/07/2018, 11/12/2019, 01/08/2021   PFIZER(Purple Top)SARS-COV-2 Vaccination 05/23/2019, 06/13/2019, 12/10/2019   PNEUMOCOCCAL CONJUGATE-20 08/24/2021   Pfizer Covid-19 Vaccine Bivalent Booster 4yrs & up 01/12/2021   Pneumococcal Polysaccharide-23 07/10/2019   Tdap 02/11/2021    TDAP status: Up to date  Flu Vaccine status: Due, Education has been provided regarding the importance of this vaccine. Advised may receive this vaccine at local pharmacy or Health Dept. Aware to provide a copy of the vaccination record if obtained from local  pharmacy or Health Dept. Verbalized acceptance and understanding.  Pneumococcal vaccine status: Up to date  Covid-19 vaccine status: Information provided on how to obtain vaccines.   Qualifies for Shingles Vaccine? Yes   Zostavax completed No   Shingrix Completed?: No.    Education has been provided regarding the importance of this vaccine. Patient has been advised to call insurance company to determine out of pocket expense if they have not yet received this vaccine. Advised may also receive vaccine at local pharmacy or Health Dept. Verbalized acceptance and understanding.  Screening Tests Health Maintenance  Topic Date Due   Zoster Vaccines- Shingrix (1 of 2) Never done   OPHTHALMOLOGY EXAM  01/12/2021   PAP SMEAR-Modifier  02/05/2021   MAMMOGRAM  03/23/2021   Medicare Annual Wellness (AWV)  02/15/2022   COVID-19 Vaccine (5 - 2023-24 season) 09/04/2022   FOOT EXAM  08/25/2022   INFLUENZA VACCINE  04/03/2023 (Originally 08/04/2022)   HEMOGLOBIN A1C  03/03/2023   Diabetic kidney evaluation - eGFR measurement  09/01/2023   Diabetic kidney evaluation - Urine ACR  09/01/2023   DTaP/Tdap/Td (2 - Td or Tdap) 02/12/2031   Colonoscopy  08/04/2032   Hepatitis C Screening  Completed   HIV Screening  Completed   HPV  VACCINES  Aged Out    Health Maintenance  Health Maintenance Due  Topic Date Due   Zoster Vaccines- Shingrix (1 of 2) Never done   OPHTHALMOLOGY EXAM  01/12/2021   PAP SMEAR-Modifier  02/05/2021   MAMMOGRAM  03/23/2021   Medicare Annual Wellness (AWV)  02/15/2022   COVID-19 Vaccine (5 - 2023-24 season) 09/04/2022   FOOT EXAM  08/25/2022    Colorectal cancer screening: Type of screening: Colonoscopy. Completed 8/24. Repeat every 3 years  Mammogram status: Completed 3/22. Repeat every year Ordered has been placed,    Lung Cancer Screening: (Low Dose CT Chest recommended if Age 8-80 years, 20 pack-year currently smoking OR have quit w/in 15years.) does not qualify.     Additional Screening:  Hepatitis C Screening: does qualify; Completed 7/21  Vision Screening: Recommended annual ophthalmology exams for early detection of glaucoma and other disorders of the eye. Is the patient up to date with their annual eye exam?  No  Who is the provider or what is the name of the office in which the patient attends annual eye exams? [facility] working on an appointment If pt is not established with a provider, would they like to be referred to a provider to establish care? No .   Dental Screening: Recommended annual dental exams for proper oral hygiene  Diabetic Foot Exam: Diabetic Foot Exam: Overdue, Pt has been advised about the importance in completing this exam. Pt is scheduled for diabetic foot exam on 8/23 Needs completed and documented at next visit.  Community Resource Referral / Chronic Care Management: CRR required this visit?  No   CCM required this visit?  No     Plan:     I have personally reviewed and noted the following in the patient's chart:   Medical and social history Use of alcohol, tobacco or illicit drugs  Current medications and supplements including opioid prescriptions. Patient is not currently taking opioid prescriptions. Functional ability and status Nutritional status Physical activity Advanced directives List of other physicians Hospitalizations, surgeries, and ER visits in previous 12 months Vitals Screenings to include cognitive, depression, and falls Referrals and appointments  In addition, I have reviewed and discussed with patient certain preventive protocols, quality metrics, and best practice recommendations. A written personalized care plan for preventive services as well as general preventive health recommendations were provided to patient.     Sydell Axon, LPN   1/61/0960   After Visit Summary: (MyChart) Due to this being a telephonic visit, the after visit summary with patients personalized plan was  offered to patient via MyChart   Nurse Notes: None

## 2022-09-14 NOTE — Patient Instructions (Signed)
Nancy Barajas , Thank you for taking time to come for your Medicare Wellness Visit. I appreciate your ongoing commitment to your health goals. Please review the following plan we discussed and let me know if I can assist you in the future.   Referrals/Orders/Follow-Ups/Clinician Recommendations: None  This is a list of the screening recommended for you and due dates:  Health Maintenance  Topic Date Due   Zoster (Shingles) Vaccine (1 of 2) Never done   Eye exam for diabetics  01/12/2021   Pap Smear  02/05/2021   Mammogram  03/23/2021   COVID-19 Vaccine (5 - 2023-24 season) 09/04/2022   Complete foot exam   08/25/2022   Flu Shot  04/03/2023*   Hemoglobin A1C  03/03/2023   Yearly kidney function blood test for diabetes  09/01/2023   Yearly kidney health urinalysis for diabetes  09/01/2023   Medicare Annual Wellness Visit  09/14/2023   Colon Cancer Screening  08/04/2025   DTaP/Tdap/Td vaccine (2 - Td or Tdap) 02/12/2031   Hepatitis C Screening  Completed   HIV Screening  Completed   HPV Vaccine  Aged Out  *Topic was postponed. The date shown is not the original due date.    Advanced directives: (Declined) Advance directive discussed with you today. Even though you declined this today, please call our office should you change your mind, and we can give you the proper paperwork for you to fill out. Will get paperwork from the office.  Next Medicare Annual Wellness Visit scheduled for next year: Yes 09/20/23 @ 1:25

## 2022-09-15 ENCOUNTER — Ambulatory Visit: Payer: Medicare HMO | Admitting: Urology

## 2022-09-21 ENCOUNTER — Encounter: Payer: Self-pay | Admitting: Family Medicine

## 2022-09-21 DIAGNOSIS — Z1231 Encounter for screening mammogram for malignant neoplasm of breast: Secondary | ICD-10-CM | POA: Insufficient documentation

## 2022-09-21 DIAGNOSIS — E119 Type 2 diabetes mellitus without complications: Secondary | ICD-10-CM | POA: Insufficient documentation

## 2022-09-21 NOTE — Assessment & Plan Note (Signed)
Chronic Euvolemic on exam On Entresto, Carvedilol, Trulicity, Aldactone, Farxiga Follow up with Cardiology

## 2022-09-21 NOTE — Assessment & Plan Note (Signed)
Chronic Continue daily statin

## 2022-09-21 NOTE — Assessment & Plan Note (Addendum)
Chronic On Farxiga 10 mg daily, Sodium Bicarbonate 650 mg BID and Aldactone 25 mg daily Follows with Nephrology

## 2022-09-21 NOTE — Assessment & Plan Note (Addendum)
Chronic Continue Trulicity 1.5 mg weekly.  Continue Farxiga 10 mg daily Recommend modifying diet and increase activity Check A1c, UACR Ophthalmology referral sent Foot exam at next visit On statin, ARB combo

## 2022-09-21 NOTE — Assessment & Plan Note (Signed)
Chronic Taking Ativan 0.5 mg daily as needed Follows with Psychiatry

## 2022-09-21 NOTE — Assessment & Plan Note (Signed)
Recently started gabapentin for vocal cord dysfunction  Follow up with Neurology

## 2022-09-21 NOTE — Assessment & Plan Note (Signed)
Chronic Well controlled Continue Carvedilol 25 mg BID Continue Norvasc 5 mg daily Cmet today

## 2022-09-21 NOTE — Assessment & Plan Note (Signed)
Chronic.  Recent LDL at goal Continue Crestor 20 mg daily Follows with Cardiology

## 2022-09-23 ENCOUNTER — Ambulatory Visit (INDEPENDENT_AMBULATORY_CARE_PROVIDER_SITE_OTHER): Payer: Medicare HMO | Admitting: Psychology

## 2022-09-23 DIAGNOSIS — F4310 Post-traumatic stress disorder, unspecified: Secondary | ICD-10-CM

## 2022-09-23 DIAGNOSIS — F330 Major depressive disorder, recurrent, mild: Secondary | ICD-10-CM | POA: Diagnosis not present

## 2022-09-23 NOTE — Progress Notes (Signed)
Behavioral Health Counselor/Therapist Progress Note  Patient ID: Nancy Barajas, MRN: [id number]   Date: 09/23/22  Time Spent: 3:02 pm - 3:47 pm : 45 Minutes  Treatment Type: Individual Therapy.  Reported Symptoms: anxiety and depression  Mental Status Exam: Appearance:  Casual     Behavior: Appropriate  Motor: Normal  Speech/Language:  Clear and Coherent  Affect: Appropriate  Mood: anxious and dysthymic  Thought process: normal  Thought content:   WNL  Sensory/Perceptual disturbances:   WNL  Orientation: oriented to person, place, time/date, and situation  Attention: Good  Concentration: Good  Memory: WNL  Fund of knowledge:  Good  Insight:   Good  Judgment:  Good  Impulse Control: Good   Risk Assessment: Danger to Self:  No Self-injurious Behavior: No Danger to Others: No Duty to Warn:no Physical Aggression / Violence:No  Access to Firearms a concern: No  Gang Involvement:No   Subjective:   Nancy Barajas participated from home, via video, is aware of the limitations of tele-sessions, and consented to treatment. Therapist participated from home office.  Nancy Barajas reviewed the events of the past week. She noted recently having a colonoscopy this past month and is scheduled for numerous appointments and noted being "terrified of MRI machine". She noted that the MRI machine "makes her want to run". She is slated to meet with her psychiatric provider and will discuss this concern. She noted being able to complete an MRI scan, prior to developing vocal cord dysfunction, with a manageable amount of anxiety. We worked on identifying possible ways to address her anxiety including speaking with her prescriber, contacting the MRI practice to ask for possible suggestions to manage her anxiety during the scan, asking family members to accompany her for support (scheduling ahead of time or finding a weekend appointment), consider sedation and inquire with the imaging  practice for additional details.  She noted her general anxiety and depression being higher than before and noted her intent to discuss this concerns with her psychiatric provider. We worked on identifying the possible source of her increase in severity of symptoms. She noted a sense of failure that "this is all I accomplished", not having her eldest daughter in her life, not feeling connect with her younger daughter who lives with her, being on disability.. We will work on processing this going forward. Therapist praised Nancy Barajas for her effort during the session. Therapist provided supportive therapy and a follow was scheduled for continued treatment.    Interventions: CBT  Diagnosis:  PTSD (post-traumatic stress disorder)  MDD (major depressive disorder), recurrent episode, mild (HCC)  Psychiatric Treatment: Yes , Azucena Cecil, MD   Treatment Plan:  Client Abilities/Strengths Nancy Barajas is motivated and self-aware.   Support System: [employer] OPT  Client Statement of Needs Griffyn would like to better manage her anxiety, discontinue her lorazapam prescription, and become more social.   Treatment Level Weekly  Symptoms  Depression: loss of interest, feeling down, difficulty sleeping, fluctuating appetite, difficulty concentrating.   (Status: maintained) Anxiety: feeling anxious, difficulty controlling worry, worrying about different things, trouble relaxing, and feeling awful as if something might happen.    (Status: maintained)  Goals:   Meylani experiences symptoms of anxiety and depression.    Target Date: 11/19/22 Frequency: Weekly  Progress: 0 Modality: individual    Therapist will provide referrals for additional resources as appropriate.  Therapist will provide psycho-education regarding Nancy Barajas's diagnosis and corresponding treatment approaches and interventions. Licensed Clinical Social Worker, [place],  LCSW will support the  patient's ability to achieve the goals identified. will employ CBT, BA, Problem-solving, Solution Focused, Mindfulness,  coping skills, & other evidenced-based practices will be used to promote progress towards healthy functioning to help manage decrease symptoms associated with her diagnosis.   Reduce overall level, frequency, and intensity of the feelings of depression, anxiety and panic evidenced by decreased her overall symptom from 6 to 7 days/week to 0 to 1 days/week per client report for at least 3 consecutive months. Verbally express understanding of the relationship between feelings of depression, anxiety and their impact on thinking patterns and behaviors. Verbalize an understanding of the role that distorted thinking plays in creating fears, excessive worry, and ruminations.    Nancy Barajas participated in the creation of the treatment plan)    Delight Ovens, LCSW

## 2022-09-28 ENCOUNTER — Encounter: Payer: Self-pay | Admitting: Pulmonary Disease

## 2022-09-28 ENCOUNTER — Ambulatory Visit: Payer: Medicare HMO | Admitting: Pulmonary Disease

## 2022-09-28 VITALS — BP 122/82 | HR 99 | Temp 97.8°F | Ht 71.0 in | Wt 195.4 lb

## 2022-09-28 DIAGNOSIS — R490 Dysphonia: Secondary | ICD-10-CM

## 2022-09-28 DIAGNOSIS — J385 Laryngeal spasm: Secondary | ICD-10-CM | POA: Diagnosis not present

## 2022-09-28 DIAGNOSIS — G4733 Obstructive sleep apnea (adult) (pediatric): Secondary | ICD-10-CM

## 2022-09-28 MED ORDER — BACLOFEN 5 MG PO TABS: 5.0000 mg | ORAL_TABLET | Freq: Every evening | ORAL | 0 refills | Status: AC | PRN

## 2022-09-28 NOTE — Progress Notes (Signed)
[facility]  Name: IVET SZYMBORSKI MRN: [id number] DOB: 1960/08/02  Chief Complaint  Patient presents with   Follow-up    Sleep Apnea   Summary: Nancy Barajas is a 62 y.o. female former smoker with obstructive sleep apnea.  She also has chronic dyspnea related to muscle tension dysphonia with laryngospasm.  Subjective: Her home sleep study showed severe sleep apnea.  She was tried on auto CPAP at home.  She was also set up for a CPAP titration study.  She was tried with a full face mask.  She wasn't able to tolerate full face mask.  She also would have trouble with feeling short of breath and her breathing couldn't get synced with CPAP.  She is not using therapy at this time.  She feels tired all the time still.  She doesn't use anything to help her sleep.  She takes lorazepam daily to help with anxiety.  She was seen by ENT and speech therapy previously.  Exercises for dysphonia weren't effective.  She is reluctant to consider botox injections.  She doesn't remember ever trying a muscle relaxant.  Past medical history: She  has a past medical history of Anxiety, Asthma, CKD stage 3a, GFR 45-59 ml/min (HCC), Depression, Diabetes mellitus without complication (HCC), History of kidney stones, Hypertension, Kidney stones, Multinodular goiter, Sleep apnea, Vertigo, and Vocal cord dysfunction.  Vital signs: BP 122/82 (BP Location: Right Arm, Cuff Size: Normal)   Pulse 99   Temp 97.8 F (36.6 C)   Ht 5\' 11"  (1.803 m)   Wt 195 lb 6.4 oz (88.6 kg)   LMP 01/03/2013   SpO2 96%   BMI 27.25 kg/m   Physical exam:  General - alert, frequent pauses during speech and jerking neck movements; these are controlled with purse lipped breathing ENT - no sinus tenderness, no stridor Cardiac - regular rate/rhythm, no murmur Chest - equal breath sounds b/l, no wheezing or rales Extremities - no cyanosis, clubbing, or edema Skin - no rashes Psych - normal mood and  behavior  Studies: HST 10/20/20 >> AHI 32.1, SpO2 low 85% Echo 05/03/22 >> EF 45 to 50%  Discussion: This is a difficult situation.  She has severe obstructive sleep apnea in the setting of HFrEF.  She also has muscle tension dysphonia with laryngospasm.  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  Previous work with ENT and speech therapy was unsuccessful.  She is willing to try CPAP again.  Will see if using a combination of lorazepam and baclofen on the night of her CPAP titration study will make it so that she can tolerate CPAP therapy.  If this works in the sleep lab, then this might be a reasonable compromise for her to tolerate therapy at home.  I do not think she would be able to tolerate an oral appliance or hypoglossal nerve stimulator in the setting of muscle tension dysphonia and laryngospasm.  She is reluctant to consider other surgical options for sleep apnea.  Assessment/plan:  Obstructive sleep apnea. - will arrange for an in-lab CPAP titration study; she is to use lorazepam and baclofen on the night of the study  Muscle tension dysphonia with laryngospasm. - previously seen by Dr. Lenard Simmer with ENT - discussed the option of repeat assessment with ENT and speech therapy, but she would like to defer this for now  Non-ischemic CM with chronic HFrEF. - followed by Dr. Gala Romney with cardiology  CKD 3a. - followed  by Dr. Lamont Dowdy with [facility]  Patient Instructions  Will arrange for a CPAP titration study.  Specified to have them use a nasal mask or nasal pillows mask during the study.  Take lorazepam and baclofen on the night of the study.  Will call to arrange a follow up appointment after your sleep study is reviewed.  Allergies as of 09/28/2022       Reactions   Penicillins Rash   Did it involve swelling of the face/tongue/throat, SOB, or low BP? No Did it involve sudden or severe rash/hives, skin peeling, or any reaction on the inside  of your mouth or nose? No Did you need to seek medical attention at a hospital or doctor's office? No When did it last happen?      Childhood If all above answers are "NO", may proceed with cephalosporin use.        Medication List        Accurate as of September 28, 2022  2:21 PM. If you have any questions, ask your nurse or doctor.          amLODipine 5 MG tablet Commonly known as: NORVASC Take 1 tablet (5 mg total) by mouth daily.   Baclofen 5 MG Tabs Take 1 tablet (5 mg total) by mouth at bedtime as needed (for muscles spasms). Started by: Coralyn Helling   blood glucose meter kit and supplies Dispense based on patient and insurance preference. Use twice daily as directed. (FOR ICD-10 E10.9, E11.9).   carvedilol 25 MG tablet Commonly known as: COREG Take 1 tablet (25 mg total) by mouth 2 (two) times daily with a meal.   CENTRUM SILVER 50+WOMEN PO Take 1 tablet by mouth daily.   Entresto 97-103 MG Generic drug: sacubitril-valsartan Take 1 tablet by mouth 2 (two) times daily.   Farxiga 10 MG Tabs tablet Generic drug: dapagliflozin propanediol Take 1 tablet (10 mg total) by mouth daily.   gabapentin 100 MG capsule Commonly known as: NEURONTIN Take 100 mg by mouth 3 (three) times daily.   Lancets Misc 1 Device by Does not apply route in the morning, at noon, and at bedtime. Dispense for onetouch ultra 2 machine ONLY   onetouch ultrasoft lancets Use as instructed bid   LORazepam 0.5 MG tablet Commonly known as: ATIVAN TAKE 1 TABLET BY MOUTH ONCE DAILY AS NEEDED FOR ANXIETY   montelukast 10 MG tablet Commonly known as: SINGULAIR Take 1 tablet (10 mg total) by mouth daily.   OneTouch Ultra test strip Generic drug: glucose blood USE TO CHECK BLOOD SUGAR TWICE DAILY AS DIRECTED   rosuvastatin 20 MG tablet Commonly known as: Crestor Take 1 tablet (20 mg total) by mouth daily.   sodium bicarbonate 650 MG tablet Take 1 tablet (650 mg total) by mouth 2 (two)  times daily.   sodium chloride 0.65 % Soln nasal spray Commonly known as: OCEAN Place 1 spray into both nostrils as needed for congestion.   spironolactone 25 MG tablet Commonly known as: ALDACTONE Take 1 tablet (25 mg total) by mouth daily.   Trulicity 1.5 MG/0.5ML Sopn Generic drug: Dulaglutide Inject 1.5 mg into the skin once a week.   Vitamin D-3 125 MCG (5000 UT) Tabs Take 1 tablet by mouth daily.        Time spent: 38 minutes  Signature: Coralyn Helling, MD [facility] Pager - [phone] 09/28/2022, 2:30 PM

## 2022-09-28 NOTE — Patient Instructions (Signed)
Will arrange for a CPAP titration study.  Specified to have them use a nasal mask or nasal pillows mask during the study.  Take lorazepam and baclofen on the night of the study.  Will call to arrange a follow up appointment after your sleep study is reviewed.

## 2022-10-02 ENCOUNTER — Other Ambulatory Visit: Payer: Self-pay | Admitting: Psychiatry
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
## 2022-10-10 ENCOUNTER — Ambulatory Visit: Payer: Medicare HMO | Admitting: Psychiatry

## 2022-10-12 ENCOUNTER — Other Ambulatory Visit: Payer: Self-pay

## 2022-10-12 DIAGNOSIS — E119 Type 2 diabetes mellitus without complications: Secondary | ICD-10-CM

## 2022-10-12 DIAGNOSIS — E1159 Type 2 diabetes mellitus with other circulatory complications: Secondary | ICD-10-CM

## 2022-10-12 MED ORDER — ONETOUCH ULTRA VI STRP: ORAL_STRIP | 3 refills | Status: DC

## 2022-10-12 MED ORDER — MONTELUKAST SODIUM 10 MG PO TABS: 10.0000 mg | ORAL_TABLET | Freq: Every day | ORAL | 3 refills | Status: DC

## 2022-10-17 ENCOUNTER — Ambulatory Visit: Payer: Medicare HMO | Admitting: Psychiatry

## 2022-10-18 ENCOUNTER — Ambulatory Visit
Admission: RE | Admit: 2022-10-18 | Discharge: 2022-10-18 | Disposition: A | Discharge status: Home / Self Care | Payer: Medicare HMO | Source: Ambulatory Visit | Attending: Family Medicine | Admitting: Family Medicine

## 2022-10-18 DIAGNOSIS — Z1231 Encounter for screening mammogram for malignant neoplasm of breast: Secondary | ICD-10-CM | POA: Insufficient documentation

## 2022-10-20 ENCOUNTER — Ambulatory Visit (INDEPENDENT_AMBULATORY_CARE_PROVIDER_SITE_OTHER): Payer: Medicare HMO | Admitting: Psychology

## 2022-10-20 ENCOUNTER — Other Ambulatory Visit: Payer: Self-pay | Admitting: Family Medicine

## 2022-10-20 DIAGNOSIS — F4310 Post-traumatic stress disorder, unspecified: Secondary | ICD-10-CM

## 2022-10-20 DIAGNOSIS — R928 Other abnormal and inconclusive findings on diagnostic imaging of breast: Secondary | ICD-10-CM

## 2022-10-20 DIAGNOSIS — F330 Major depressive disorder, recurrent, mild: Secondary | ICD-10-CM | POA: Diagnosis not present

## 2022-10-20 NOTE — Progress Notes (Signed)
[facility] Counselor/Therapist Progress Note  Patient ID: FINLEY VINCENZO, MRN: [id number]   Date: 10/20/22  Time Spent: 3:05 pm - 3:47  pm :42 Minutes  Treatment Type: Individual Therapy.  Reported Symptoms: anxiety and depression  Mental Status Exam: Appearance:  Casual     Behavior: Appropriate  Motor: Normal  Speech/Language:  Clear and Coherent  Affect: Appropriate  Mood: anxious and dysthymic  Thought process: normal  Thought content:   WNL  Sensory/Perceptual disturbances:   WNL  Orientation: oriented to person, place, time/date, and situation  Attention: Good  Concentration: Good  Memory: WNL  Fund of knowledge:  Good  Insight:   Good  Judgment:  Good  Impulse Control: Good   Risk Assessment: Danger to Self:  No Self-injurious Behavior: No Danger to Others: No Duty to Warn:no Physical Aggression / Violence:No  Access to Firearms a concern: No  Gang Involvement:No   Subjective:   [name] participated from home, via video, is aware of the limitations of tele-sessions, and consented to treatment. Therapist participated from home office.  Tashira reviewed the events of the past week. [name] noted an increase in her anxiety and depression. She is hopeful that her psychiatric provider will make a change to her medication to improve her symptoms. She noted her spasms being a significant barrier to socializing. She noted not having the motivation to follow-up on possible resources for support online. She noted technological barriers, as well. She noted worry that the financial arrangement, living with her daughter, is causing significant pressure and stress for her. We worked on processing this, worked on identifying evidence for this, and [name] noted that she has not discussed this with her daughter or perceived feedback. We processed this and therapist challenged this during the session. Therapist provided psycho-education regarding cognitive  distortions and the effect on mood, outlook, and behavior. She noted difficulty completing tasks around her home noted energy and motivation being barriers. Therapist employed BA principles to set manageable goals, create a schedule, use reminders, identify and address barriers and use positive reinforcers. Therapist modeled this during the session. [name] was engaged and motivated during the session. Therapist praised [name] for her effort during the session and her willingness to consider different perspectives and approaches. Therapist provided supportive therapy and a follow-up was scheduled for continued treatment.   Interventions: CBT  Diagnosis:  PTSD (post-traumatic stress disorder)  MDD (major depressive disorder), recurrent episode, mild (HCC)  Psychiatric Treatment: Yes , Azucena Cecil, MD   Treatment Plan:  Client Abilities/Strengths Keandra is motivated and self-aware.   Support System: [employer] OPT  Client Statement of Needs Laicee would like to better manage her anxiety, discontinue her lorazapam prescription, and become more social.   Treatment Level Weekly  Symptoms  Depression: loss of interest, feeling down, difficulty sleeping, fluctuating appetite, difficulty concentrating.   (Status: maintained) Anxiety: feeling anxious, difficulty controlling worry, worrying about different things, trouble relaxing, and feeling awful as if something might happen.    (Status: maintained)  Goals:   Kingslee experiences symptoms of anxiety and depression.    Target Date: 11/19/22 Frequency: Weekly  Progress: 0 Modality: individual    Therapist will provide referrals for additional resources as appropriate.  Therapist will provide psycho-education regarding Karsynn's diagnosis and corresponding treatment approaches and interventions. Licensed Clinical Social Worker, [place], LCSW will support the patient's ability to achieve the  goals identified. will employ CBT, BA, Problem-solving, Solution Focused, Mindfulness,  coping skills, &  other evidenced-based practices will be used to promote progress towards healthy functioning to help manage decrease symptoms associated with her diagnosis.   Reduce overall level, frequency, and intensity of the feelings of depression, anxiety and panic evidenced by decreased her overall symptom from 6 to 7 days/week to 0 to 1 days/week per client report for at least 3 consecutive months. Verbally express understanding of the relationship between feelings of depression, anxiety and their impact on thinking patterns and behaviors. Verbalize an understanding of the role that distorted thinking plays in creating fears, excessive worry, and ruminations.    [name] participated in the creation of the treatment plan)    Delight Ovens, LCSW

## 2022-10-25 ENCOUNTER — Telehealth: Payer: Self-pay | Admitting: Family Medicine

## 2022-10-25 DIAGNOSIS — I10 Essential (primary) hypertension: Secondary | ICD-10-CM

## 2022-10-25 MED ORDER — CARVEDILOL 25 MG PO TABS
25.0000 mg | ORAL_TABLET | Freq: Two times a day (BID) | ORAL | 3 refills | Status: DC
Start: 2022-10-25 — End: 2023-11-20

## 2022-10-25 NOTE — Telephone Encounter (Signed)
Prescription Request  10/25/2022  LOV: 09/01/2022  What is the name of the medication or equipment? carvedilol   Have you contacted your pharmacy to request a refill? No   Which pharmacy would you like this sent to?  [facility] [address] ([address]), [place] - [address] [place]) [place] [address] Phone: [phone] Fax: [phone]    Patient notified that their request is being sent to the clinical staff for review and that they should receive a response within 2 business days.   Please advise at Mobile [phone] (mobile)

## 2022-10-28 ENCOUNTER — Encounter: Payer: Medicare HMO | Admitting: Internal Medicine
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
## 2022-11-01 ENCOUNTER — Ambulatory Visit
Admission: RE | Admit: 2022-11-01 | Discharge: 2022-11-01 | Disposition: A | Discharge status: Home / Self Care | Payer: Medicare HMO | Source: Ambulatory Visit | Attending: Family Medicine | Admitting: Family Medicine

## 2022-11-01 DIAGNOSIS — R928 Other abnormal and inconclusive findings on diagnostic imaging of breast: Secondary | ICD-10-CM | POA: Diagnosis not present

## 2022-11-01 DIAGNOSIS — R92322 Mammographic fibroglandular density, left breast: Secondary | ICD-10-CM | POA: Diagnosis not present

## 2022-11-02 ENCOUNTER — Other Ambulatory Visit: Payer: Self-pay | Admitting: Family Medicine

## 2022-11-02 DIAGNOSIS — R928 Other abnormal and inconclusive findings on diagnostic imaging of breast: Secondary | ICD-10-CM

## 2022-11-02 DIAGNOSIS — R921 Mammographic calcification found on diagnostic imaging of breast: Secondary | ICD-10-CM

## 2022-11-03 ENCOUNTER — Ambulatory Visit: Payer: Medicare HMO | Admitting: Urology

## 2022-11-03 ENCOUNTER — Ambulatory Visit: Payer: Medicare HMO | Admitting: Psychiatry

## 2022-11-03 ENCOUNTER — Encounter: Payer: Self-pay | Admitting: Psychiatry

## 2022-11-03 VITALS — BP 137/86 | HR 96 | Temp 98.0°F | Ht 71.0 in | Wt 197.0 lb

## 2022-11-03 VITALS — BP 121/76 | HR 99 | Ht 71.0 in | Wt 196.3 lb

## 2022-11-03 DIAGNOSIS — R93429 Abnormal radiologic findings on diagnostic imaging of unspecified kidney: Secondary | ICD-10-CM | POA: Diagnosis not present

## 2022-11-03 DIAGNOSIS — F330 Major depressive disorder, recurrent, mild: Secondary | ICD-10-CM | POA: Diagnosis not present

## 2022-11-03 DIAGNOSIS — R490 Dysphonia: Secondary | ICD-10-CM

## 2022-11-03 DIAGNOSIS — F411 Generalized anxiety disorder: Secondary | ICD-10-CM

## 2022-11-03 DIAGNOSIS — N3281 Overactive bladder: Secondary | ICD-10-CM

## 2022-11-03 DIAGNOSIS — N200 Calculus of kidney: Secondary | ICD-10-CM

## 2022-11-03 DIAGNOSIS — R351 Nocturia: Secondary | ICD-10-CM | POA: Diagnosis not present

## 2022-11-03 DIAGNOSIS — F4310 Post-traumatic stress disorder, unspecified: Secondary | ICD-10-CM

## 2022-11-03 MED ORDER — LORAZEPAM 0.5 MG PO TABS: 0.5000 mg | ORAL_TABLET | Freq: Every day | ORAL | 1 refills | Status: AC | PRN

## 2022-11-03 MED ORDER — SODIUM BICARBONATE 650 MG PO TABS: 650.0000 mg | ORAL_TABLET | Freq: Two times a day (BID) | ORAL | 11 refills | Status: DC

## 2022-11-03 NOTE — Progress Notes (Signed)
11/03/2022 1:45 PM   ARIELIS HINZMAN Apr 09, 1960 [id number]  Reason for visit: Follow up uric acid nephrolithiasis, microscopic hematuria, OAB   HPI: 62 year old female I follow for the above issues.  She underwent left ureteroscopy, laser lithotripsy, and stent placement in September 2021 for a large 2 cm left renal pelvis stone, with stone analysis showing uric acid.  Stent was subsequently removed, and she was started on sodium bicarbonate(cannot take potassium citrate) at that time for alkalinization.  She denies any stone episodes since 2021.  She has had extensive work-up for microscopic hematuria previously which was benign.  I also reviewed her outside BMP that shows stable renal function, normal sodium.  Urinalysis from March 2024 showed a pH of 5 and glucosuria but was otherwise benign  I personally viewed and interpreted her renal ultrasound today, no hydronephrosis, possible punctate renal stones bilaterally   She has some mild urinary symptoms of occasional urgency, nocturia 1-2 times overnight that are minimally bothersome.  We again discussed OAB medications, but she is not bothered enough to consider meds at this time.   Continue sodium bicarbonate for uric acid stone prevention, refilled RTC 1 year, consider ultrasounds every other year    Sondra Come, MD  [facility] [address] [place], [place] [address] [phone]

## 2022-11-04 ENCOUNTER — Telehealth: Payer: Self-pay | Admitting: Psychiatry

## 2022-11-04 NOTE — Addendum Note (Signed)
Addended by: Neysa Hotter on: 11/04/2022 08:13 AM   Modules accepted: Orders

## 2022-11-04 NOTE — Telephone Encounter (Signed)
After discussion with the neurologist, the referral has been cancelled, as additional neurology involvement is unlikely to provide further benefit.  According to chart review, the patient completed six sessions of voice therapy in 2020 with limited benefit. She was seen by otolaryngology in 2021, as noted below. A follow-up with otolaryngology may be appropriate, given her recent neurology evaluation. I will discuss this with the patient.  "Impression:  ICD-10-CM  1. Muscle tension dysphonia new R49.0  2. Laryngeal spasm new J38.5  3. Shortness of breath new R06.02   Plan: This is a difficult case. Possible this is related to MTD, respiratory dystonia, other neuro condition. Think worth starting with voice therapy to see if we can help reduce the gasping during speech. This will also help evaluate her breathing and speech patterns. May consider neurology evaluation. Difficult to determine what muscle group to target if we were to consider Botox. "

## 2022-11-08 ENCOUNTER — Other Ambulatory Visit: Payer: Self-pay

## 2022-11-08 DIAGNOSIS — E1159 Type 2 diabetes mellitus with other circulatory complications: Secondary | ICD-10-CM

## 2022-11-12 MED ORDER — FARXIGA 10 MG PO TABS: 10.0000 mg | ORAL_TABLET | Freq: Every day | ORAL | 3 refills | Status: DC

## 2022-11-12 MED ORDER — AMLODIPINE BESYLATE 5 MG PO TABS: 5.0000 mg | ORAL_TABLET | Freq: Every day | ORAL | 3 refills | Status: DC

## 2022-11-15 ENCOUNTER — Ambulatory Visit
Admission: RE | Admit: 2022-11-15 | Discharge: 2022-11-15 | Disposition: A | Discharge status: Home / Self Care | Payer: Medicare HMO | Source: Ambulatory Visit | Attending: Family Medicine | Admitting: Family Medicine

## 2022-11-15 ENCOUNTER — Ambulatory Visit
Admission: RE | Admit: 2022-11-15 | Discharge: 2022-11-15 | Disposition: A | Discharge status: Home / Self Care | Payer: Medicare HMO | Source: Ambulatory Visit | Attending: Family Medicine

## 2022-11-15 DIAGNOSIS — N6489 Other specified disorders of breast: Secondary | ICD-10-CM | POA: Insufficient documentation

## 2022-11-15 DIAGNOSIS — R928 Other abnormal and inconclusive findings on diagnostic imaging of breast: Secondary | ICD-10-CM

## 2022-11-15 DIAGNOSIS — R921 Mammographic calcification found on diagnostic imaging of breast: Secondary | ICD-10-CM | POA: Insufficient documentation

## 2022-11-15 HISTORY — PX: BREAST BIOPSY: SHX20

## 2022-11-15 MED ORDER — LIDOCAINE HCL (PF) 1 % IJ SOLN
10.0000 mL | Freq: Once | INTRAMUSCULAR | Status: DC
  Filled 2022-11-15: qty 10

## 2022-11-15 MED ORDER — LIDOCAINE 1 % OPTIME INJ - NO CHARGE
5.0000 mL | Freq: Once | INTRAMUSCULAR | Status: AC
  Administered 2022-11-15: 5 mL
  Filled 2022-11-15: qty 6

## 2022-11-15 MED ORDER — LIDOCAINE-EPINEPHRINE 1 %-1:100000 IJ SOLN
10.0000 mL | Freq: Once | INTRAMUSCULAR | Status: AC
  Administered 2022-11-15: 10 mL
  Filled 2022-11-15: qty 10

## 2022-11-16 LAB — SURGICAL PATHOLOGY

## 2022-11-17 ENCOUNTER — Ambulatory Visit (INDEPENDENT_AMBULATORY_CARE_PROVIDER_SITE_OTHER): Payer: Medicare HMO | Admitting: Psychology

## 2022-11-17 DIAGNOSIS — F411 Generalized anxiety disorder: Secondary | ICD-10-CM

## 2022-11-17 DIAGNOSIS — F4310 Post-traumatic stress disorder, unspecified: Secondary | ICD-10-CM | POA: Diagnosis not present

## 2022-11-17 DIAGNOSIS — F330 Major depressive disorder, recurrent, mild: Secondary | ICD-10-CM | POA: Diagnosis not present

## 2022-11-17 NOTE — Progress Notes (Signed)
[facility] Counselor/Therapist Progress Note  Patient ID: Nancy Barajas, MRN: [id number]   Date: 11/17/22  Time Spent: 1:04 pm - 1:44  pm : 40 Minutes  Treatment Type: Individual Therapy.  Reported Symptoms: anxiety and depression  Mental Status Exam: Appearance:  Casual     Behavior: Appropriate  Motor: Normal  Speech/Language:  Clear and Coherent  Affect: Appropriate  Mood: anxious and dysthymic  Thought process: normal  Thought content:   WNL  Sensory/Perceptual disturbances:   WNL  Orientation: oriented to person, place, time/date, and situation  Attention: Good  Concentration: Good  Memory: WNL  Fund of knowledge:  Good  Insight:   Good  Judgment:  Good  Impulse Control: Good   Risk Assessment: Danger to Self:  No Self-injurious Behavior: No Danger to Others: No Duty to Warn:no Physical Aggression / Violence:No  Access to Firearms a concern: No  Gang Involvement:No   Subjective:   Nancy Barajas participated Barajas home, via video, is aware of the limitations of tele-sessions, and consented to treatment. Therapist participated Barajas home office.  Nancy Barajas the events of the past week. She noted receiving good news regarding a recent biopsy. She noted a recent change in her medication, by her psychiatric provider, and was prescribed Gabapentin. She noted no change in her symptoms but that she's able to take less of the lorazepam. She noted wanting to do things but noted lethargy. She would like to socialize, cook, and clean the house. She noted working on reducing her expectations, setting smaller goals, but noted the barrier being energy. Other barriers including being distracted (negative thought) and noted often giving up. She noted experiencing more negative self-talk including "you can't do this no way". She noted often feeling sad as a result.  We discussed the importance of setting expectations, managing self-talk, challenging self,  highlighting positives. Therapist modeled this during the session and worked on challenging Nancy Barajas's negative self-talk. Nancy Barajas was engaged and motivated during the session. She expressed commitment towards goals. We discussed the importance reassessing goals, challenging self, checking-in, and problem-solving. Therapist modeled this during the session. We scheduled a follow-up for continued treatment, which Nancy Barajas. Therapist provided supportive therapy.   Interventions: CBT & BA  Diagnosis:  MDD (major depressive disorder), recurrent episode, mild (HCC)  PTSD (post-traumatic stress disorder)  Anxiety state  Psychiatric Treatment: Yes , Azucena Cecil, MD   Treatment Plan:  Client Abilities/Strengths Nancy Barajas is motivated and self-aware.   Support System: [employer] OPT  Client Statement of Needs Nancy Barajas would like to better manage her anxiety, discontinue her lorazapam prescription, and become more social.   Treatment Level Weekly  Symptoms  Depression: loss of interest, feeling down, difficulty sleeping, fluctuating appetite, difficulty concentrating.   (Status: maintained) Anxiety: feeling anxious, difficulty controlling worry, worrying about different things, trouble relaxing, and feeling awful as if something might happen.    (Status: maintained)  Goals:   Ameyaa experiences symptoms of anxiety and depression.    Target Date: 11/19/22 Frequency: Weekly  Progress: 0 Modality: individual    Therapist will provide referrals for additional resources as appropriate.  Therapist will provide psycho-education regarding Tecora's diagnosis and corresponding treatment approaches and interventions. Licensed Clinical Social Worker, Eunice, LCSW will support the patient's ability to achieve the goals identified. will employ CBT, BA, Problem-solving, Solution Focused, Mindfulness,  coping skills, & other evidenced-based  practices will be used to promote progress towards healthy functioning to help manage decrease symptoms  associated with her diagnosis.   Reduce overall level, frequency, and intensity of the feelings of depression, anxiety and panic evidenced by decreased her overall symptom Barajas 6 to 7 days/week to 0 to 1 days/week per client report for at least 3 consecutive months. Verbally express understanding of the relationship between feelings of depression, anxiety and their impact on thinking patterns and behaviors. Verbalize an understanding of the role that distorted thinking plays in creating fears, excessive worry, and ruminations.    Nancy Barajas participated in the creation of the treatment plan)    Delight Ovens, LCSW

## 2022-11-28 ENCOUNTER — Other Ambulatory Visit: Payer: Self-pay

## 2022-11-28 DIAGNOSIS — I700 Atherosclerosis of aorta: Secondary | ICD-10-CM

## 2022-11-28 DIAGNOSIS — E1159 Type 2 diabetes mellitus with other circulatory complications: Secondary | ICD-10-CM

## 2022-11-28 DIAGNOSIS — E119 Type 2 diabetes mellitus without complications: Secondary | ICD-10-CM

## 2022-11-28 MED ORDER — ONETOUCH ULTRA VI STRP: ORAL_STRIP | 3 refills | Status: DC

## 2022-11-28 MED ORDER — ROSUVASTATIN CALCIUM 20 MG PO TABS: 20.0000 mg | ORAL_TABLET | Freq: Every day | ORAL | 1 refills | Status: DC

## 2022-11-28 MED ORDER — TRULICITY 1.5 MG/0.5ML ~~LOC~~ SOAJ: 1.5000 mg | SUBCUTANEOUS | 5 refills | Status: AC

## 2022-11-28 NOTE — Addendum Note (Signed)
Addended by: Benedict Needy on: 11/28/2022 12:34 PM   Modules accepted: Orders

## 2022-12-06 ENCOUNTER — Encounter: Payer: Self-pay | Admitting: Family Medicine

## 2022-12-06 ENCOUNTER — Ambulatory Visit (INDEPENDENT_AMBULATORY_CARE_PROVIDER_SITE_OTHER): Payer: Medicare HMO | Admitting: Family Medicine

## 2022-12-06 VITALS — BP 120/70 | HR 98 | Temp 98.1°F | Ht 71.0 in | Wt 201.0 lb

## 2022-12-06 DIAGNOSIS — F39 Unspecified mood [affective] disorder: Secondary | ICD-10-CM

## 2022-12-06 DIAGNOSIS — Z7984 Long term (current) use of oral hypoglycemic drugs: Secondary | ICD-10-CM

## 2022-12-06 DIAGNOSIS — E1159 Type 2 diabetes mellitus with other circulatory complications: Secondary | ICD-10-CM

## 2022-12-06 DIAGNOSIS — Z7985 Long-term (current) use of injectable non-insulin antidiabetic drugs: Secondary | ICD-10-CM

## 2022-12-06 DIAGNOSIS — E118 Type 2 diabetes mellitus with unspecified complications: Secondary | ICD-10-CM | POA: Diagnosis not present

## 2022-12-06 DIAGNOSIS — Z23 Encounter for immunization: Secondary | ICD-10-CM | POA: Diagnosis not present

## 2022-12-06 DIAGNOSIS — I152 Hypertension secondary to endocrine disorders: Secondary | ICD-10-CM | POA: Diagnosis not present

## 2022-12-06 DIAGNOSIS — J383 Other diseases of vocal cords: Secondary | ICD-10-CM

## 2022-12-06 NOTE — Patient Instructions (Addendum)
It was a pleasure meeting you today. Thank you for allowing me to take part in your health care.  Our goals for today as we discussed include:  Continue current medication  Will check blood work at next visit  Follow up with Psychiatry as scheduled  Follow up with Cardiology as scheduled  Consider referral for Movement disorder doctor for spasm.   Flu vaccine today  This is a list of the screening recommended for you and due dates:  Health Maintenance  Topic Date Due   Zoster (Shingles) Vaccine (1 of 2) Never done   Eye exam for diabetics  01/12/2021   Complete foot exam   08/25/2022   COVID-19 Vaccine (5 - 2023-24 season) 09/04/2022   Pap with HPV screening  02/06/2023   Flu Shot  04/03/2023*   Hemoglobin A1C  03/03/2023   Yearly kidney function blood test for diabetes  09/01/2023   Yearly kidney health urinalysis for diabetes  09/01/2023   Medicare Annual Wellness Visit  09/14/2023   Mammogram  10/18/2023   Colon Cancer Screening  08/04/2025   DTaP/Tdap/Td vaccine (2 - Td or Tdap) 02/12/2031   Hepatitis C Screening  Completed   HIV Screening  Completed   HPV Vaccine  Aged Out  *Topic was postponed. The date shown is not the original due date.     Follow up in 3 months   If you have any questions or concerns, please do not hesitate to call the office at [phone].  I look forward to our next visit and until then take care and stay safe.  Regards,   Dana Allan, MD   [facility]

## 2022-12-06 NOTE — Progress Notes (Signed)
SUBJECTIVE:   Chief Complaint  Patient presents with   Medical Management of Chronic Issues   HPI Presents to clinic for follow up chronic disease management  Discussed the use of AI scribe software for clinical note transcription with the patient, who gave verbal consent to proceed.  History of Present Illness The patient, with a history of diabetes, hypertension, and depression, presents for a three-month follow-up. They report feeling "a little depressed and anxious." Their last HbA1c was 7.8, three months ago. They had a brief interruption in their Trulicity treatment due to insurance and pharmacy issues, but have recently resumed it. They also continue to take [place]. Their blood sugars have been relatively controlled, with readings ranging from 115 to 187.  The patient also reports concerns about weight gain, which they attribute to gabapentin. They were off Trulicity for a while, which may have contributed to the weight gain. They are currently on a lower dose of Trulicity, and there is a plan to increase the dose once they are accustomed to it again, which should help lower their HbA1c and potentially reduce their weight.  In terms of their mental health, the patient is seeing a psychiatrist and is currently taking lorazepam and gabapentin. They have tried Zoloft in the past but did not like it. They are due to see their psychiatrist in a couple of weeks and will discuss the possibility of adding or increasing medication for their depression.  The patient also has concerns about their multivitamin intake, following an article they read online suggesting that too much could pose health risks. Their labs from the last visit were normal, and they are advised to continue taking their multivitamin and vitamin D supplements.  Lastly, the patient has been experiencing laryngeal spasms, which cause them significant distress. They have seen an ENT specialist and a neurologist in the past, but  the cause of the spasms remains unclear.   PERTINENT PMH / PSH: As above  OBJECTIVE:  BP 120/70   Pulse 98   Temp 98.1 F (36.7 C) (Oral)   Ht 5\' 11"  (1.803 m)   Wt 201 lb (91.2 kg)   LMP 01/03/2013   SpO2 98%   BMI 28.03 kg/m    Physical Exam Vitals reviewed.  Constitutional:      General: She is not in acute distress.    Appearance: Normal appearance. She is normal weight. She is not ill-appearing, toxic-appearing or diaphoretic.  Eyes:     General:        Right eye: No discharge.        Left eye: No discharge.     Conjunctiva/sclera: Conjunctivae normal.  Cardiovascular:     Rate and Rhythm: Normal rate and regular rhythm.     Heart sounds: Normal heart sounds.  Pulmonary:     Effort: Pulmonary effort is normal.     Breath sounds: Normal breath sounds.  Abdominal:     General: Bowel sounds are normal.  Musculoskeletal:        General: Normal range of motion.  Skin:    General: Skin is warm and dry.  Neurological:     General: No focal deficit present.     Mental Status: She is alert and oriented to person, place, and time. Mental status is at baseline.  Psychiatric:        Mood and Affect: Mood normal.        Behavior: Behavior normal.        Thought Content: Thought content  normal.        Judgment: Judgment normal.        12/06/2022    3:15 PM 11/03/2022   11:40 AM 09/14/2022    1:53 PM 09/01/2022   10:48 AM 03/28/2022    1:29 PM  Depression screen PHQ 2/9  Decreased Interest 3  1 2    Down, Depressed, Hopeless 2  1 2    PHQ - 2 Score 5  2 4    Altered sleeping 2  1 2    Tired, decreased energy 2  1 2    Change in appetite 1  1 1    Feeling bad or failure about yourself  2  1 2    Trouble concentrating 1  0 1   Moving slowly or fidgety/restless 0  0 0   Suicidal thoughts 0  0 0   PHQ-9 Score 13  6 12    Difficult doing work/chores Somewhat difficult  Somewhat difficult Somewhat difficult      Information is confidential and restricted. Go to Review  Flowsheets to unlock data.      12/06/2022    3:15 PM 11/03/2022   11:40 AM 09/01/2022   10:48 AM 03/28/2022    1:29 PM  GAD 7 : Generalized Anxiety Score  Nervous, Anxious, on Edge 2  2   Control/stop worrying 2  1   Worry too much - different things 2  1   Trouble relaxing 1  1   Restless 0  0   Easily annoyed or irritable 0  0   Afraid - awful might happen 2  1   Total GAD 7 Score 9  6   Anxiety Difficulty Somewhat difficult  Somewhat difficult      Information is confidential and restricted. Go to Review Flowsheets to unlock data.    ASSESSMENT/PLAN:  Need for influenza vaccination -     Flu vaccine trivalent PF, 6mos and older(Flulaval,Afluria,Fluarix,Fluzone)  Type 2 diabetes mellitus with complications (HCC) Assessment & Plan: A1c increased to 7.8 from 6.3. Patient was off Trulicity for 2-3 weeks due to insurance issues but has restarted. Blood sugars have been reasonable with highest reading of 187. -Continue Trulicity 1.5mg  weekly and Farxiga as prescribed. -Check A1c in 3 months (6 months from last check) to assess control after restarting Trulicity.   Mood disorder [facility]) Assessment & Plan: Patient reports ongoing symptoms. Currently on Lorazepam and Gabapentin. Has tried Zoloft in the past but did not tolerate it well. -Encourage discussion with psychiatrist regarding potential addition or adjustment of medications during next appointment in December.   Hypertension associated with diabetes (HCC) Assessment & Plan: Blood pressure readings at home have been variable, but readings in office are within normal range. -Advise patient to bring home blood pressure monitor to next appointment for comparison with office readings.   Laryngeal dystonia/Spasmodic Dysphonia Assessment & Plan: Ongoing issue, not improved. Patient has seen ENT and neurology in the past with no clear cause identified. -Recommend follow-up with neurology for further evaluation and potential  treatment options. -Consider referral to neuromuscular disorder specialist at [facility] or [facility]   PDMP reviewed  Return in about 3 months (around 03/06/2023) for PCP, DM.  Dana Allan, MD

## 2022-12-07 ENCOUNTER — Telehealth: Payer: Self-pay | Admitting: Pulmonary Disease

## 2022-12-07 NOTE — Telephone Encounter (Signed)
Dr. Craige Cotta saw the patient on 09/28/22 and order cpap titration study to be done. We received a note from [facility] stating the patient has not returned their calls to schedule sleep study. They will not make further attempts to schedule this patient

## 2022-12-09 ENCOUNTER — Encounter: Payer: Medicare HMO | Admitting: Internal Medicine

## 2022-12-11 DIAGNOSIS — Z23 Encounter for immunization: Secondary | ICD-10-CM | POA: Insufficient documentation

## 2022-12-11 NOTE — Assessment & Plan Note (Signed)
Patient reports ongoing symptoms. Currently on Lorazepam and Gabapentin. Has tried Zoloft in the past but did not tolerate it well. -Encourage discussion with psychiatrist regarding potential addition or adjustment of medications during next appointment in December.

## 2022-12-11 NOTE — Assessment & Plan Note (Signed)
Ongoing issue, not improved. Patient has seen ENT and neurology in the past with no clear cause identified. -Recommend follow-up with neurology for further evaluation and potential treatment options. -Consider referral to neuromuscular disorder specialist at [facility] or [facility]

## 2022-12-11 NOTE — Assessment & Plan Note (Signed)
Blood pressure readings at home have been variable, but readings in office are within normal range. -Advise patient to bring home blood pressure monitor to next appointment for comparison with office readings.

## 2022-12-11 NOTE — Assessment & Plan Note (Signed)
A1c increased to 7.8 from 6.3. Patient was off Trulicity for 2-3 weeks due to insurance issues but has restarted. Blood sugars have been reasonable with highest reading of 187. -Continue Trulicity 1.5mg  weekly and Farxiga as prescribed. -Check A1c in 3 months (6 months from last check) to assess control after restarting Trulicity.

## 2022-12-16 ENCOUNTER — Ambulatory Visit: Payer: Medicare HMO | Admitting: Psychology
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
## 2023-01-03 ENCOUNTER — Ambulatory Visit: Payer: Medicare HMO | Admitting: Psychiatry
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
## 2023-01-09 ENCOUNTER — Telehealth (INDEPENDENT_AMBULATORY_CARE_PROVIDER_SITE_OTHER): Payer: Medicare HMO | Admitting: Psychiatry

## 2023-01-09 ENCOUNTER — Encounter: Payer: Self-pay | Admitting: Psychiatry

## 2023-01-09 DIAGNOSIS — F330 Major depressive disorder, recurrent, mild: Secondary | ICD-10-CM | POA: Diagnosis not present

## 2023-01-09 DIAGNOSIS — F411 Generalized anxiety disorder: Secondary | ICD-10-CM

## 2023-01-09 DIAGNOSIS — F4310 Post-traumatic stress disorder, unspecified: Secondary | ICD-10-CM | POA: Diagnosis not present

## 2023-01-09 MED ORDER — BUPROPION HCL 75 MG PO TABS: 75.0000 mg | ORAL_TABLET | Freq: Every day | ORAL | 1 refills | Status: DC

## 2023-01-19 ENCOUNTER — Encounter: Payer: Medicare HMO | Admitting: Internal Medicine

## 2023-01-21 DIAGNOSIS — Z008 Encounter for other general examination: Secondary | ICD-10-CM | POA: Diagnosis not present

## 2023-01-25 ENCOUNTER — Other Ambulatory Visit: Payer: Self-pay

## 2023-01-25 DIAGNOSIS — I428 Other cardiomyopathies: Secondary | ICD-10-CM

## 2023-01-25 MED ORDER — ENTRESTO 97-103 MG PO TABS: 1.0000 | ORAL_TABLET | Freq: Two times a day (BID) | ORAL | 0 refills | Status: DC
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
## 2023-02-15 ENCOUNTER — Encounter: Payer: Self-pay | Admitting: Psychiatry

## 2023-02-15 ENCOUNTER — Telehealth (INDEPENDENT_AMBULATORY_CARE_PROVIDER_SITE_OTHER): Payer: Medicare HMO | Admitting: Psychiatry

## 2023-02-15 DIAGNOSIS — F4310 Post-traumatic stress disorder, unspecified: Secondary | ICD-10-CM

## 2023-02-15 DIAGNOSIS — F330 Major depressive disorder, recurrent, mild: Secondary | ICD-10-CM | POA: Diagnosis not present

## 2023-02-15 DIAGNOSIS — Z79899 Other long term (current) drug therapy: Secondary | ICD-10-CM

## 2023-02-15 DIAGNOSIS — F411 Generalized anxiety disorder: Secondary | ICD-10-CM

## 2023-02-15 MED ORDER — GABAPENTIN 100 MG PO CAPS: 100.0000 mg | ORAL_CAPSULE | Freq: Three times a day (TID) | ORAL | 1 refills | Status: DC

## 2023-02-15 MED ORDER — LORAZEPAM 0.5 MG PO TABS: 0.5000 mg | ORAL_TABLET | Freq: Every day | ORAL | 1 refills | Status: AC | PRN

## 2023-02-16 ENCOUNTER — Encounter: Payer: Medicare HMO | Admitting: Internal Medicine

## 2023-02-20 ENCOUNTER — Other Ambulatory Visit: Payer: Self-pay

## 2023-02-20 DIAGNOSIS — I428 Other cardiomyopathies: Secondary | ICD-10-CM

## 2023-02-20 MED ORDER — SACUBITRIL-VALSARTAN 97-103 MG PO TABS: 1.0000 | ORAL_TABLET | Freq: Two times a day (BID) | ORAL | 0 refills | Status: AC

## 2023-02-20 NOTE — Telephone Encounter (Signed)
 Last office visit:  02/02/22 with plan to f/u in 12 months Next office visit: none/NO active recall  Requested Prescriptions   Signed Prescriptions Disp Refills   sacubitril-valsartan (ENTRESTO) 97-103 MG 60 tablet 0    Sig: Take 1 tablet by mouth 2 (two) times daily. Overdue yearly follow up visit.  PLEASE CALL OFFICE TO SCHEDULE APPOINTMENT PRIOR TO NEXT REFILL (first attempt)    Authorizing Provider: Debbe Odea    Ordering User: Guerry Minors

## 2023-03-01 DIAGNOSIS — I129 Hypertensive chronic kidney disease with stage 1 through stage 4 chronic kidney disease, or unspecified chronic kidney disease: Secondary | ICD-10-CM | POA: Diagnosis not present

## 2023-03-01 DIAGNOSIS — E1129 Type 2 diabetes mellitus with other diabetic kidney complication: Secondary | ICD-10-CM | POA: Diagnosis not present

## 2023-03-01 DIAGNOSIS — R809 Proteinuria, unspecified: Secondary | ICD-10-CM | POA: Diagnosis not present

## 2023-03-01 DIAGNOSIS — N2581 Secondary hyperparathyroidism of renal origin: Secondary | ICD-10-CM | POA: Diagnosis not present

## 2023-03-01 DIAGNOSIS — D631 Anemia in chronic kidney disease: Secondary | ICD-10-CM | POA: Diagnosis not present

## 2023-03-01 DIAGNOSIS — N1831 Chronic kidney disease, stage 3a: Secondary | ICD-10-CM | POA: Diagnosis not present

## 2023-03-06 ENCOUNTER — Ambulatory Visit: Payer: Medicare HMO | Admitting: Family Medicine

## 2023-03-06 ENCOUNTER — Encounter: Payer: Self-pay | Admitting: Family Medicine

## 2023-03-06 VITALS — BP 130/70 | HR 95 | Temp 97.7°F | Resp 18 | Ht 71.0 in | Wt 204.0 lb

## 2023-03-06 DIAGNOSIS — R42 Dizziness and giddiness: Secondary | ICD-10-CM | POA: Diagnosis not present

## 2023-03-06 DIAGNOSIS — Z7984 Long term (current) use of oral hypoglycemic drugs: Secondary | ICD-10-CM | POA: Diagnosis not present

## 2023-03-06 DIAGNOSIS — E118 Type 2 diabetes mellitus with unspecified complications: Secondary | ICD-10-CM

## 2023-03-06 DIAGNOSIS — I152 Hypertension secondary to endocrine disorders: Secondary | ICD-10-CM

## 2023-03-06 DIAGNOSIS — Z7985 Long-term (current) use of injectable non-insulin antidiabetic drugs: Secondary | ICD-10-CM | POA: Diagnosis not present

## 2023-03-06 DIAGNOSIS — E1159 Type 2 diabetes mellitus with other circulatory complications: Secondary | ICD-10-CM

## 2023-03-06 LAB — POCT GLYCOSYLATED HEMOGLOBIN (HGB A1C): Hemoglobin A1C: 7.3 % — AB (ref 4.0–5.6)

## 2023-03-06 NOTE — Patient Instructions (Signed)
 It was a pleasure meeting you today. Thank you for allowing me to take part in your health care.  Our goals for today as we discussed include:  A1c 7.3 Continue Trulicity 1.5 mg weekly     This is a list of the screening recommended for you and due dates:  Health Maintenance  Topic Date Due   Zoster (Shingles) Vaccine (1 of 2) Never done   Eye exam for diabetics  01/12/2021   COVID-19 Vaccine (5 - 2024-25 season) 09/04/2022   Pap with HPV screening  02/06/2023   Yearly kidney function blood test for diabetes  09/01/2023   Yearly kidney health urinalysis for diabetes  09/01/2023   Hemoglobin A1C  09/06/2023   Medicare Annual Wellness Visit  09/14/2023   Mammogram  10/18/2023   Complete foot exam   03/05/2024   Colon Cancer Screening  08/04/2025   DTaP/Tdap/Td vaccine (2 - Td or Tdap) 02/12/2031   Pneumococcal Vaccination  Completed   Flu Shot  Completed   Hepatitis C Screening  Completed   HIV Screening  Completed   HPV Vaccine  Aged Out      If you have any questions or concerns, please do not hesitate to call the office at [phone].  I look forward to our next visit and until then take care and stay safe.  Regards,   Dana Allan, MD   [facility]

## 2023-03-06 NOTE — Progress Notes (Signed)
 SUBJECTIVE:   Chief Complaint  Patient presents with   Diabetes    3 month follow up   HPI Follow up for chronic disease management  Discussed the use of AI scribe software for clinical note transcription with the patient, who gave verbal consent to proceed.  History of Present Illness Nancy Barajas is a 63 year old female with type 2 diabetes who presents for a three-month follow-up visit.  She is here to review her diabetes management. Her recent hemoglobin A1c is 7.3%, decreased from 7.8%. She is taking [place] and Trulicity 1.5 mg. She occasionally forgets to take Trulicity, sometimes missing a week, and notes worsening muscle spasms when she does take it. She took her dose yesterday.  She experiences dizziness, particularly in the mornings after getting up and moving around, which has been more pronounced over the past month. No associated shortness of breath or vertigo. She drinks a lot of water and some apple juice, aiming for two to three bottles of water daily. She has not discussed this symptom with her kidney doctor, whom she will see on the 19th.  Her blood pressure readings at home vary, with occasional high readings attributed to her monitor. She is taking Entresto, with no recent changes in dosage. Dizziness occurs throughout the day, improving by evening.  She is also on Ativan (lorazepam) and Neurontin, with no recent changes in these medications. She has been advised to undergo a drug screen for Ativan, which she finds costly. She has not started Losporin for depression due to apprehension.  She reports a history of urinary incontinence with occasional leakage. No recent changes in her medications.      PERTINENT PMH / PSH: As above  OBJECTIVE:  BP 130/70   Pulse 95   Temp 97.7 F (36.5 C)   Resp 18   Ht 5\' 11"  (1.803 m)   Wt 204 lb (92.5 kg)   LMP 01/03/2013   SpO2 97%   BMI 28.45 kg/m     Physical Exam Vitals reviewed.  Constitutional:       General: She is not in acute distress.    Appearance: Normal appearance. She is not ill-appearing, toxic-appearing or diaphoretic.  Eyes:     General:        Right eye: No discharge.        Left eye: No discharge.     Conjunctiva/sclera: Conjunctivae normal.  Cardiovascular:     Rate and Rhythm: Normal rate and regular rhythm.     Heart sounds: Normal heart sounds.  Pulmonary:     Effort: Pulmonary effort is normal.     Breath sounds: Normal breath sounds.  Abdominal:     General: Bowel sounds are normal.  Musculoskeletal:        General: Normal range of motion.  Skin:    General: Skin is warm and dry.  Neurological:     General: No focal deficit present.     Mental Status: She is alert and oriented to person, place, and time. Mental status is at baseline.  Psychiatric:        Mood and Affect: Mood normal.        Behavior: Behavior normal.        Thought Content: Thought content normal.        Judgment: Judgment normal.           03/06/2023    2:37 PM 12/06/2022    3:15 PM 11/03/2022   11:40 AM 09/14/2022  1:53 PM 09/01/2022   10:48 AM  Depression screen PHQ 2/9  Decreased Interest 2 3  1 2   Down, Depressed, Hopeless 2 2  1 2   PHQ - 2 Score 4 5  2 4   Altered sleeping 2 2  1 2   Tired, decreased energy 2 2  1 2   Change in appetite 1 1  1 1   Feeling bad or failure about yourself  2 2  1 2   Trouble concentrating 1 1  0 1  Moving slowly or fidgety/restless 0 0  0 0  Suicidal thoughts 0 0  0 0  PHQ-9 Score 12 13  6 12   Difficult doing work/chores Somewhat difficult Somewhat difficult  Somewhat difficult Somewhat difficult     Information is confidential and restricted. Go to Review Flowsheets to unlock data.      03/06/2023    2:37 PM 12/06/2022    3:15 PM 11/03/2022   11:40 AM 09/01/2022   10:48 AM  GAD 7 : Generalized Anxiety Score  Nervous, Anxious, on Edge 2 2  2   Control/stop worrying 1 2  1   Worry too much - different things 1 2  1   Trouble relaxing 1 1  1    Restless 0 0  0  Easily annoyed or irritable 0 0  0  Afraid - awful might happen 1 2  1   Total GAD 7 Score 6 9  6   Anxiety Difficulty Somewhat difficult Somewhat difficult  Somewhat difficult     Information is confidential and restricted. Go to Review Flowsheets to unlock data.    ASSESSMENT/PLAN:  Type 2 diabetes mellitus with complications (HCC) Assessment & Plan: HbA1c improved to 7.3%. Concerns about Trulicity adherence and muscle spasms. - Encouraged regular Trulicity adherence. If A1c continues to increase recommend increasing dose   Orders: -     POCT glycosylated hemoglobin (Hb A1C)  Hypertension associated with diabetes (HCC) Assessment & Plan: Blood pressure generally controlled. Dizziness possibly due to orthostatic hypotension or medication effects. - Schedule nurse appointment to verify home monitor accuracy.    Dizziness Assessment & Plan:  Has history of vertigo.  No focal deficits on neuro exam.   - Perform orthostatic vital signs. - Recent Cr increased, recommend increasing water intake daily      PDMP reviewed  Return if symptoms worsen or fail to improve, for PCP.  Dana Allan, MD

## 2023-03-13 ENCOUNTER — Encounter: Payer: Self-pay | Admitting: Family Medicine

## 2023-03-13 ENCOUNTER — Encounter: Payer: Medicare HMO | Admitting: Internal Medicine

## 2023-03-13 DIAGNOSIS — R42 Dizziness and giddiness: Secondary | ICD-10-CM | POA: Insufficient documentation

## 2023-03-13 NOTE — Assessment & Plan Note (Signed)
 Blood pressure generally controlled. Dizziness possibly due to orthostatic hypotension or medication effects. - Schedule nurse appointment to verify home monitor accuracy.

## 2023-03-13 NOTE — Assessment & Plan Note (Addendum)
 Has history of vertigo.  No focal deficits on neuro exam.   - Perform orthostatic vital signs. - Recent Cr increased, recommend increasing water intake daily

## 2023-03-13 NOTE — Assessment & Plan Note (Signed)
 HbA1c improved to 7.3%. Concerns about Trulicity adherence and muscle spasms. - Encouraged regular Trulicity adherence. If A1c continues to increase recommend increasing dose
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
## 2023-04-05 DIAGNOSIS — D631 Anemia in chronic kidney disease: Secondary | ICD-10-CM | POA: Diagnosis not present

## 2023-04-05 DIAGNOSIS — E1129 Type 2 diabetes mellitus with other diabetic kidney complication: Secondary | ICD-10-CM | POA: Diagnosis not present

## 2023-04-05 DIAGNOSIS — R809 Proteinuria, unspecified: Secondary | ICD-10-CM | POA: Diagnosis not present

## 2023-04-05 DIAGNOSIS — I129 Hypertensive chronic kidney disease with stage 1 through stage 4 chronic kidney disease, or unspecified chronic kidney disease: Secondary | ICD-10-CM | POA: Diagnosis not present

## 2023-04-05 DIAGNOSIS — N1831 Chronic kidney disease, stage 3a: Secondary | ICD-10-CM | POA: Diagnosis not present

## 2023-04-05 DIAGNOSIS — N2581 Secondary hyperparathyroidism of renal origin: Secondary | ICD-10-CM | POA: Diagnosis not present

## 2023-04-07 ENCOUNTER — Encounter: Payer: Self-pay | Admitting: Psychiatry

## 2023-04-07 ENCOUNTER — Telehealth: Payer: Medicare HMO | Admitting: Psychiatry

## 2023-04-07 DIAGNOSIS — F4310 Post-traumatic stress disorder, unspecified: Secondary | ICD-10-CM | POA: Diagnosis not present

## 2023-04-07 DIAGNOSIS — Z79899 Other long term (current) drug therapy: Secondary | ICD-10-CM

## 2023-04-07 DIAGNOSIS — F411 Generalized anxiety disorder: Secondary | ICD-10-CM | POA: Diagnosis not present

## 2023-04-07 DIAGNOSIS — F330 Major depressive disorder, recurrent, mild: Secondary | ICD-10-CM

## 2023-04-07 MED ORDER — PREGABALIN 25 MG PO CAPS: 25.0000 mg | ORAL_CAPSULE | Freq: Two times a day (BID) | ORAL | 1 refills | Status: DC

## 2023-04-07 NOTE — Patient Instructions (Addendum)
 Hold bupropion- Start pregabalin 25 mg twice a day  Discontinue gabapentin Continue lorazepam 0.5 mg daily as needed for anxiety Obtain UDS Next appointment: 5/21 at 9 am Please contact Dr. Noel Gerold, Joanna Puff, MD  [facility]  for follow up [address] [place], [place] [address] Office: [phone]

## 2023-04-10 ENCOUNTER — Telehealth: Payer: Self-pay

## 2023-04-10 NOTE — Telephone Encounter (Signed)
 Received a fax from patients pharmacy to initiate a Prior Authorization for the Pregablin 25 mg capsule received a message after checking eligibility for patients coverage message stated that no coverage found called patient to verify insurance patient had the same ID numbers and stated that she did not have a different card she is calling her insurance and will call the office with the updated information. Also called the pharmacy spoke to [place]  they did not have any different information.

## 2023-04-26 ENCOUNTER — Telehealth: Payer: Self-pay

## 2023-04-26 NOTE — Telephone Encounter (Signed)
 Copied from CRM [id number]. Topic: Appointments - Transfer of Care >> Apr 26, 2023  2:02 PM [place] E wrote: Pt is requesting to transfer FROM: Dr. Valli Gaw Pt is requesting to transfer TO: Tona Francis, NP Reason for requested transfer: Dr. Sueanne Emerald will be leaving the practice. It is the responsibility of the team the patient would like to transfer to Tona Francis, NP) to reach out to the patient if for any reason this transfer is not acceptable.

## 2023-04-27 NOTE — Telephone Encounter (Signed)
 Noted.

## 2023-04-27 NOTE — Telephone Encounter (Signed)
 Okay with me

## 2023-05-19 ENCOUNTER — Ambulatory Visit: Admitting: Cardiology

## 2023-05-21 ENCOUNTER — Other Ambulatory Visit: Payer: Self-pay | Admitting: Family Medicine

## 2023-05-21 DIAGNOSIS — I700 Atherosclerosis of aorta: Secondary | ICD-10-CM

## 2023-05-24 ENCOUNTER — Telehealth: Admitting: Psychiatry

## 2023-05-24 LAB — HM DIABETES EYE EXAM

## 2023-06-06 ENCOUNTER — Other Ambulatory Visit (HOSPITAL_COMMUNITY): Payer: Self-pay

## 2023-06-06 ENCOUNTER — Telehealth: Payer: Self-pay

## 2023-06-06 NOTE — Telephone Encounter (Signed)
 Pharmacy Patient Advocate Encounter   Received notification from Onbase that prior authorization for OneTouch Ultra strips  is required/requested.   Insurance verification completed.   The patient is insured through [place] .   Per test claim:  Accu-Chek or True Metrix is preferred by the insurance.  If suggested medication is appropriate, Please send in a new RX and discontinue this one. If not, please advise as to why it's not appropriate so that we may request a Prior Authorization. Please note, some preferred medications may still require a PA.  If the suggested medications have not been trialed and there are no contraindications to their use, the PA will not be submitted, as it will not be approved.

## 2023-06-07 ENCOUNTER — Other Ambulatory Visit: Payer: Self-pay

## 2023-06-07 MED ORDER — GLUCOSE BLOOD VI STRP: ORAL_STRIP | 1 refills | Status: AC

## 2023-06-07 NOTE — Addendum Note (Signed)
 Addended by: Barb Levers on: 06/07/2023 11:05 AM   Modules accepted: Orders

## 2023-06-07 NOTE — Telephone Encounter (Signed)
 Order has been placed.
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
## 2023-06-13 ENCOUNTER — Other Ambulatory Visit: Payer: Self-pay | Admitting: Family Medicine

## 2023-06-13 ENCOUNTER — Other Ambulatory Visit: Payer: Self-pay

## 2023-06-13 DIAGNOSIS — I152 Hypertension secondary to endocrine disorders: Secondary | ICD-10-CM

## 2023-06-13 MED ORDER — BLOOD GLUCOSE TEST VI STRP: 1.0000 | ORAL_STRIP | Freq: Three times a day (TID) | 1 refills | Status: AC

## 2023-06-13 MED ORDER — LANCETS MISC. MISC: 1.0000 | Freq: Three times a day (TID) | 0 refills | Status: AC

## 2023-06-13 MED ORDER — LANCET DEVICE MISC: 1.0000 | Freq: Three times a day (TID) | 0 refills | Status: AC

## 2023-06-13 MED ORDER — BLOOD GLUCOSE MONITORING SUPPL DEVI: 1.0000 | Freq: Three times a day (TID) | 0 refills | Status: AC

## 2023-06-13 NOTE — Telephone Encounter (Signed)
 Copied from CRM [id number]. Topic: Clinical - Medication Refill >> Jun 13, 2023  4:09 PM Nancy Barajas wrote: Medication:  Panel Detail for Blood Glucose Meter Kit / Glucose Test Strips / Lancets    Has the patient contacted their pharmacy? Yes (Agent: If no, request that the patient contact the pharmacy for the refill. If patient does not wish to contact the pharmacy document the reason why and proceed with request.) (Agent: If yes, when and what did the pharmacy advise?)  This is the patient's preferred pharmacy:  [facility] [address]), [place] - [address] [name]) [place] [address] Phone: [phone] Fax: [phone]  Is this the correct pharmacy for this prescription? Yes If no, delete pharmacy and type the correct one.   Has the prescription been filled recently? Yes  Is the patient out of the medication? Yes  Has the patient been seen for an appointment in the last year OR does the patient have an upcoming appointment? Yes  Can we respond through MyChart? Yes  Agent: Please be advised that Rx refills may take up to 3 business days. We ask that you follow-up with your pharmacy.

## 2023-06-14 ENCOUNTER — Ambulatory Visit: Attending: Cardiology | Admitting: Cardiology

## 2023-06-14 ENCOUNTER — Encounter: Payer: Self-pay | Admitting: Cardiology

## 2023-06-14 VITALS — BP 130/70 | HR 99 | Ht 71.0 in | Wt 206.8 lb

## 2023-06-14 DIAGNOSIS — I428 Other cardiomyopathies: Secondary | ICD-10-CM

## 2023-06-14 DIAGNOSIS — I10 Essential (primary) hypertension: Secondary | ICD-10-CM

## 2023-06-14 DIAGNOSIS — E7800 Pure hypercholesterolemia, unspecified: Secondary | ICD-10-CM

## 2023-06-14 MED ORDER — LANCETS MISC: 1.0000 | Freq: Three times a day (TID) | 11 refills | Status: DC

## 2023-06-14 NOTE — Progress Notes (Signed)
 Cardiology Office Note:    Date:  06/14/2023   ID:  Nancy Barajas, DOB Jun 09, 1960, MRN [id number]  PCP:  Valli Gaw, MD  Cardiologist:  Constancia Delton, MD  Electrophysiologist:  None   Referring MD: Valli Gaw, MD   Chief Complaint  Patient presents with   Follow-up    12 month follow up pat has been doing well with no complaints of chest pain, chest pressure or SOB, medciation reviewed verbally with patient    History of Present Illness:    Nancy Barajas is a 63 y.o. female with a hx of hypertension, NICM, initial EF 25-30%, improved to 40-45% on GDMT, diabetes, hyperlipidemia, CKD stage III, who presents for follow-up.    Doing okay from cardiac perspective, denies chest pain or shortness of breath.  Denies edema.  Compliant with medications as prescribed.  Still has hiccups which is chronic.    Prior notes Echo 10/2020 EF 50 to 55% Echo 09/2019 EF 40 to 45%. Echo 03/20/2019 EF 25 to 30% Left heart catheter 04/2019 no evidence of CAD, normal coronary arteries   Past Medical History:  Diagnosis Date   Anxiety    Asthma    CKD stage 3a, GFR 45-59 ml/min (HCC)    Depression    Diabetes mellitus without complication (HCC)    History of kidney stones    Hypertension    Kidney stones    Laryngospasm    Multinodular goiter    Muscle tension dysphonia    Sleep apnea    Vertigo    when laying flat   Vocal cord dysfunction    makes it hard for patient to talk and breathe    Past Surgical History:  Procedure Laterality Date   BREAST BIOPSY Left 11/15/2022   Left Breast stereo bx, coil clip path pending   BREAST BIOPSY Left 11/15/2022   MM LT BREAST BX W LOC DEV 1ST LESION IMAGE BX SPEC STEREO GUIDE 11/15/2022 [facility]   CARDIAC CATHETERIZATION     COLONOSCOPY WITH PROPOFOL  N/A 08/05/2022   Procedure: COLONOSCOPY WITH PROPOFOL ;  Surgeon: Shane Darling, MD;  Location: [facility];  Service: Endoscopy;  Laterality: N/A;   CYSTOSCOPY  W/ RETROGRADES Right 09/27/2019   Procedure: CYSTOSCOPY WITH RETROGRADE PYELOGRAM;  Surgeon: Lawerence Pressman, MD;  Location: [facility];  Service: Urology;  Laterality: Right;   CYSTOSCOPY/URETEROSCOPY/HOLMIUM LASER/STENT PLACEMENT Left 09/27/2019   Procedure: CYSTOSCOPY/URETEROSCOPY/HOLMIUM LASER/STENT PLACEMENT;  Surgeon: Lawerence Pressman, MD;  Location: [facility];  Service: Urology;  Laterality: Left;   HEMOSTASIS CLIP PLACEMENT  08/05/2022   Procedure: HEMOSTASIS CLIP PLACEMENT;  Surgeon: Shane Darling, MD;  Location: [facility];  Service: Endoscopy;;   LITHOTRIPSY     POLYPECTOMY  08/05/2022   Procedure: POLYPECTOMY;  Surgeon: Shane Darling, MD;  Location: [facility];  Service: Endoscopy;;   RIGHT/LEFT HEART CATH AND CORONARY ANGIOGRAPHY Bilateral 04/08/2019   Procedure: RIGHT/LEFT HEART CATH AND CORONARY ANGIOGRAPHY;  Surgeon: Wenona Hamilton, MD;  Location: [facility];  Service: Cardiovascular;  Laterality: Bilateral;    Current Medications: Current Meds  Medication Sig   amLODipine  (NORVASC ) 5 MG tablet Take 1 tablet (5 mg total) by mouth daily.   Baclofen  5 MG TABS Take 1 tablet (5 mg total) by mouth at bedtime as needed (for muscles spasms).   blood glucose meter kit and supplies Dispense based on patient and insurance preference. Use twice daily as directed. (FOR ICD-10 E10.9, E11.9).   Blood Glucose Monitoring Suppl DEVI  1 each by Does not apply route in the morning, at noon, and at bedtime. May substitute to any manufacturer covered by patient's insurance.   carvedilol  (COREG ) 25 MG tablet Take 1 tablet (25 mg total) by mouth 2 (two) times daily with a meal.   Cholecalciferol (VITAMIN D -3) 125 MCG (5000 UT) TABS Take 1 tablet by mouth daily.   Dulaglutide  (TRULICITY ) 1.5 MG/0.5ML SOAJ Inject 1.5 mg into the skin once a week.   FARXIGA  10 MG TABS tablet Take 1 tablet (10 mg total) by mouth daily.   Glucose Blood (BLOOD GLUCOSE TEST STRIPS) STRP 1 each  by In Vitro route in the morning, at noon, and at bedtime. May substitute to any manufacturer covered by patient's insurance.   glucose blood test strip Check blood glucose 2 times a day   Lancet Device MISC 1 each by Does not apply route in the morning, at noon, and at bedtime. May substitute to any manufacturer covered by patient's insurance.   Lancets (ONETOUCH ULTRASOFT) lancets Use as instructed bid   Lancets Misc. MISC 1 each by Does not apply route in the morning, at noon, and at bedtime. May substitute to any manufacturer covered by patient's insurance.   Lancets MISC 1 Device by Does not apply route in the morning, at noon, and at bedtime. Dispense for onetouch ultra 2 machine ONLY   montelukast  (SINGULAIR ) 10 MG tablet Take 1 tablet (10 mg total) by mouth daily.   Multiple Vitamins-Minerals (CENTRUM SILVER 50+WOMEN PO) Take 1 tablet by mouth daily.   rosuvastatin  (CRESTOR ) 20 MG tablet Take 1 tablet by mouth once daily   sacubitril -valsartan  (ENTRESTO ) 97-103 MG Take 1 tablet by mouth 2 (two) times daily. Overdue yearly follow up visit.  PLEASE CALL OFFICE TO SCHEDULE APPOINTMENT PRIOR TO NEXT REFILL (first attempt)   sodium bicarbonate  650 MG tablet Take 1 tablet (650 mg total) by mouth 2 (two) times daily.   sodium chloride  (OCEAN) 0.65 % SOLN nasal spray Place 1 spray into both nostrils as needed for congestion.    spironolactone  (ALDACTONE ) 25 MG tablet Take 1 tablet (25 mg total) by mouth daily.     Allergies:   Penicillins   Social History   Socioeconomic History   Marital status: Divorced    Spouse name: Not on file   Number of children: 2   Years of education: Not on file   Highest education level: 9th grade  Occupational History   Not on file  Tobacco Use   Smoking status: Former    Current packs/day: 0.00    Average packs/day: 1 pack/day for 10.0 years (10.0 ttl pk-yrs)    Types: Cigarettes    Start date: 01/04/1988    Quit date: 01/03/1998    Years since quitting:  25.4   Smokeless tobacco: Never  Vaping Use   Vaping status: Never Used  Substance and Sexual Activity   Alcohol use: Not Currently   Drug use: Not Currently    Types: Marijuana    Comment: years ago   Sexual activity: Not Currently  Other Topics Concern   Not on file  Social History Narrative   Lives at home    2 daughters as of 04/04/19 47 y.o daughter lives with her (she works)   Patient is on disability.   Social Drivers of Corporate investment banker Strain: Low Risk  (09/14/2022)   Overall Financial Resource Strain (CARDIA)    Difficulty of Paying Living Expenses: Not hard at all  Food Insecurity: No Food Insecurity (09/14/2022)   Hunger Vital Sign    Worried About Running Out of Food in the Last Year: Never true    Ran Out of Food in the Last Year: Never true  Transportation Needs: No Transportation Needs (09/14/2022)   PRAPARE - Administrator, Civil Service (Medical): No    Lack of Transportation (Non-Medical): No  Physical Activity: Inactive (09/14/2022)   Exercise Vital Sign    Days of Exercise per Week: 0 days    Minutes of Exercise per Session: 0 min  Stress: Stress Concern Present (09/14/2022)   [employer] of Occupational Health - Occupational Stress Questionnaire    Feeling of Stress : To some extent  Social Connections: Moderately Isolated (09/14/2022)   Social Connection and Isolation Panel [NHANES]    Frequency of Communication with Friends and Family: Twice a week    Frequency of Social Gatherings with Friends and Family: More than three times a week    Attends Religious Services: Never    Database administrator or Organizations: Yes    Attends Engineer, structural: More than 4 times per year    Marital Status: Divorced     Family History: The patient's family history includes Diabetes in her father and mother; Hypertension in her father and mother. There is no history of Breast cancer.  ROS:   Please see the history of present  illness.     All other systems reviewed and are negative.  EKGs/Labs/Other Studies Reviewed:    The following studies were reviewed today:   EKG:  EKG is  ordered today.  The ekg ordered today demonstrates normal sinus rhythm, left bundle branch block  Recent Labs: 09/01/2022: ALT 17; BUN 34; Creatinine, Ser 1.28; Potassium 5.0; Sodium 135  Recent Lipid Panel    Component Value Date/Time   CHOL 201 (H) 02/08/2021 0848   TRIG 70.0 02/08/2021 0848   HDL 104.10 02/08/2021 0848   CHOLHDL 2 02/08/2021 0848   VLDL 14.0 02/08/2021 0848   LDLCALC 83 02/08/2021 0848    Physical Exam:    VS:  BP 130/70 (BP Location: Left Arm, Patient Position: Sitting, Cuff Size: Normal)   Pulse 99   Ht 5' 11 (1.803 m)   Wt 206 lb 12.8 oz (93.8 kg)   LMP 01/03/2013   SpO2 98%   BMI 28.84 kg/m     Wt Readings from Last 3 Encounters:  06/14/23 206 lb 12.8 oz (93.8 kg)  03/06/23 204 lb (92.5 kg)  12/06/22 201 lb (91.2 kg)     GEN:  Well nourished, well developed in no acute distress, obese HEENT: Normal NECK: No JVD; No carotid bruits LYMPHATICS: No lymphadenopathy CARDIAC: RRR, no murmurs, rubs, gallops RESPIRATORY:  Clear to auscultation without rales, wheezing or rhonchi  ABDOMEN: Soft, non-tender, non-distended MUSCULOSKELETAL:  No edema; No deformity  SKIN: Warm and dry NEUROLOGIC:  Alert and oriented x 3 PSYCHIATRIC:  Normal affect   ASSESSMENT:    1. NICM (nonischemic cardiomyopathy) (HCC)   2. Primary hypertension   3. Pure hypercholesterolemia    PLAN:    In order of problems listed above:  Nonischemic cardiomyopathy, initial EF 25 to 30%. Last echo 04/2022 EF improved now 45-50%.?  LBBB cardiomyopathy.  Continue Coreg  25 mg twice daily, Entresto  97-103 mg twice daily, Aldactone  25 mg daily, Farxiga  10 mg daily.  She is euvolemic, describes NYHA class II symptoms.     Hypertension, BP controlled.  Continue Coreg   25 mg twice daily, Entresto , Aldactone  25mg  daily, Norvasc  5 mg  daily.   hyperlipidemia, continue Crestor  20 mg daily.  Follow-up in 12 months.   This note was generated in part or whole with voice recognition software. Voice recognition is usually quite accurate but there are transcription errors that can and very often do occur. I apologize for any typographical errors that were not detected and corrected.  Medication Adjustments/Labs and Tests Ordered: Current medicines are reviewed at length with the patient today.  Concerns regarding medicines are outlined above.  Orders Placed This Encounter  Procedures   EKG 12-Lead     No orders of the defined types were placed in this encounter.     Patient Instructions  Medication Instructions:  Your physician recommends that you continue on your current medications as directed. Please refer to the Current Medication list given to you today.   *If you need a refill on your cardiac medications before your next appointment, please call your pharmacy*  Lab Work: No labs ordered today  If you have labs (blood work) drawn today and your tests are completely normal, you will receive your results only by: MyChart Message (if you have MyChart) OR A paper copy in the mail If you have any lab test that is abnormal or we need to change your treatment, we will call you to review the results.  Testing/Procedures: No test ordered today   Follow-Up: At [facility], you and your health needs are our priority.  As part of our continuing mission to provide you with exceptional heart care, our providers are all part of one team.  This team includes your primary Cardiologist (physician) and Advanced Practice Providers or APPs (Physician Assistants and Nurse Practitioners) who all work together to provide you with the care you need, when you need it.  Your next appointment:   1 year(s)  Provider:   You may see Constancia Delton, MD or one of the following Advanced Practice Providers on your designated  Care Team:   Laneta Pintos, NP Gildardo Labrador, PA-C Varney Gentleman, PA-C Cadence [place]ston, PA-C Ronald Cockayne, NP Morey Ar, NP    We recommend signing up for the patient portal called MyChart.  Sign up information is provided on this After Visit Summary.  MyChart is used to connect with patients for Virtual Visits (Telemedicine).  Patients are able to view lab/test results, encounter notes, upcoming appointments, etc.  Non-urgent messages can be sent to your provider as well.   To learn more about what you can do with MyChart, go to [email or web address].         Signed, Constancia Delton, MD  06/14/2023 12:40 PM    [facility]

## 2023-06-14 NOTE — Patient Instructions (Signed)
 Medication Instructions:  Your physician recommends that you continue on your current medications as directed. Please refer to the Current Medication list given to you today.   *If you need a refill on your cardiac medications before your next appointment, please call your pharmacy*  Lab Work: No labs ordered today  If you have labs (blood work) drawn today and your tests are completely normal, you will receive your results only by: MyChart Message (if you have MyChart) OR A paper copy in the mail If you have any lab test that is abnormal or we need to change your treatment, we will call you to review the results.  Testing/Procedures: No test ordered today   Follow-Up: At [facility], you and your health needs are our priority.  As part of our continuing mission to provide you with exceptional heart care, our providers are all part of one team.  This team includes your primary Cardiologist (physician) and Advanced Practice Providers or APPs (Physician Assistants and Nurse Practitioners) who all work together to provide you with the care you need, when you need it.  Your next appointment:   1 year(s)  Provider:   You may see Constancia Delton, MD or one of the following Advanced Practice Providers on your designated Care Team:   Laneta Pintos, NP Gildardo Labrador, PA-C Varney Gentleman, PA-C Cadence [place], PA-C Ronald Cockayne, NP Morey Ar, NP    We recommend signing up for the patient portal called MyChart.  Sign up information is provided on this After Visit Summary.  MyChart is used to connect with patients for Virtual Visits (Telemedicine).  Patients are able to view lab/test results, encounter notes, upcoming appointments, etc.  Non-urgent messages can be sent to your provider as well.   To learn more about what you can do with MyChart, go to [email or web address].

## 2023-06-15 ENCOUNTER — Telehealth: Admitting: Psychiatry
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
## 2023-06-19 ENCOUNTER — Telehealth (INDEPENDENT_AMBULATORY_CARE_PROVIDER_SITE_OTHER): Admitting: Psychiatry

## 2023-06-19 ENCOUNTER — Encounter: Payer: Self-pay | Admitting: Psychiatry

## 2023-06-19 DIAGNOSIS — F4310 Post-traumatic stress disorder, unspecified: Secondary | ICD-10-CM

## 2023-06-19 DIAGNOSIS — Z79899 Other long term (current) drug therapy: Secondary | ICD-10-CM | POA: Diagnosis not present

## 2023-06-19 DIAGNOSIS — F411 Generalized anxiety disorder: Secondary | ICD-10-CM

## 2023-06-19 DIAGNOSIS — F330 Major depressive disorder, recurrent, mild: Secondary | ICD-10-CM | POA: Diagnosis not present

## 2023-06-19 NOTE — Patient Instructions (Signed)
 Start pregabalin  25 mg at night  Continue lorazepam  0.5 mg daily as needed for anxiety Obtain UDS  Next appointment: 7/30 at 4 pm

## 2023-07-18 ENCOUNTER — Telehealth: Payer: Self-pay

## 2023-07-18 ENCOUNTER — Other Ambulatory Visit: Payer: Self-pay

## 2023-07-18 DIAGNOSIS — E1159 Type 2 diabetes mellitus with other circulatory complications: Secondary | ICD-10-CM

## 2023-07-18 MED ORDER — LANCETS MISC: 1.0000 | Freq: Three times a day (TID) | 11 refills | Status: AC

## 2023-07-18 NOTE — Telephone Encounter (Signed)
 Copied from CRM [id number]. Topic: Clinical - Medication Question >> Jul 18, 2023  9:31 AM Revonda D wrote: Reason for CRM: Pt stated that she is needing to get her accu-check strips refilled but was informed by the pharmacy that the office was requesting that she call first. Pt would like a callback with an update on this concern.

## 2023-07-18 NOTE — Telephone Encounter (Signed)
 Left message to return call to our office.

## 2023-07-18 NOTE — Telephone Encounter (Signed)
 Spoke to pharmacy and they stated that they have enough of the test strips, but they needed the lancets. I have resent in for pt. Pt is advised they it has been sent in.
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
## 2023-08-02 ENCOUNTER — Telehealth: Admitting: Psychiatry

## 2023-08-03 ENCOUNTER — Telehealth (INDEPENDENT_AMBULATORY_CARE_PROVIDER_SITE_OTHER): Admitting: Psychiatry

## 2023-08-03 ENCOUNTER — Encounter: Payer: Self-pay | Admitting: Psychiatry

## 2023-08-03 DIAGNOSIS — F411 Generalized anxiety disorder: Secondary | ICD-10-CM

## 2023-08-03 DIAGNOSIS — F330 Major depressive disorder, recurrent, mild: Secondary | ICD-10-CM

## 2023-08-03 DIAGNOSIS — F4310 Post-traumatic stress disorder, unspecified: Secondary | ICD-10-CM | POA: Diagnosis not present
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
## 2023-08-03 NOTE — Patient Instructions (Signed)
 Start pregabalin  25 mg at night  Continue lorazepam  0.5 mg daily as needed for anxiety Obtain UDS (financial strain) Next appointment: 9/12 at 11 am

## 2023-08-07 ENCOUNTER — Encounter: Payer: Self-pay | Admitting: Urology

## 2023-08-18 ENCOUNTER — Other Ambulatory Visit: Payer: Self-pay | Admitting: Cardiology

## 2023-08-18 ENCOUNTER — Telehealth: Payer: Self-pay

## 2023-08-18 DIAGNOSIS — I428 Other cardiomyopathies: Secondary | ICD-10-CM

## 2023-08-18 NOTE — Telephone Encounter (Signed)
 Patient is requesting a refill on Rosuvastatin

## 2023-08-20 ENCOUNTER — Other Ambulatory Visit: Payer: Self-pay | Admitting: Nurse Practitioner

## 2023-08-20 DIAGNOSIS — I700 Atherosclerosis of aorta: Secondary | ICD-10-CM

## 2023-08-20 MED ORDER — ROSUVASTATIN CALCIUM 20 MG PO TABS: 20.0000 mg | ORAL_TABLET | Freq: Every day | ORAL | 0 refills | Status: DC
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
## 2023-09-15 ENCOUNTER — Telehealth: Admitting: Psychiatry

## 2023-09-20 ENCOUNTER — Ambulatory Visit (INDEPENDENT_AMBULATORY_CARE_PROVIDER_SITE_OTHER): Payer: Medicare HMO | Admitting: *Deleted

## 2023-09-20 VITALS — Ht 71.0 in | Wt 210.0 lb

## 2023-09-20 DIAGNOSIS — Z0000 Encounter for general adult medical examination without abnormal findings: Secondary | ICD-10-CM

## 2023-09-20 DIAGNOSIS — Z1231 Encounter for screening mammogram for malignant neoplasm of breast: Secondary | ICD-10-CM

## 2023-09-20 NOTE — Progress Notes (Signed)
 Subjective:   Nancy Barajas is a 63 y.o. who presents for a Medicare Wellness preventive visit.  As a reminder, Annual Wellness Visits don't include a physical exam, and some assessments may be limited, especially if this visit is performed virtually. We may recommend an in-person follow-up visit with your provider if needed.  Visit Complete: Virtual I connected with  Nancy Barajas on 09/20/23 by a audio enabled telemedicine application and verified that I am speaking with the correct person using two identifiers.  Patient Location: Home  Provider Location: Home Office  I discussed the limitations of evaluation and management by telemedicine. The patient expressed understanding and agreed to proceed.  Vital Signs: Because this visit was a virtual/telehealth visit, some criteria may be missing or patient reported. Any vitals not documented were not able to be obtained and vitals that have been documented are patient reported.  VideoDeclined- This patient declined Librarian, academic. Therefore the visit was completed with audio only.  Persons Participating in Visit: Patient.  AWV Questionnaire: No: Patient Medicare AWV questionnaire was not completed prior to this visit.  Cardiac Risk Factors include: diabetes mellitus;dyslipidemia;hypertension;sedentary lifestyle     Objective:    Today's Vitals   09/20/23 1340  Weight: 210 lb (95.3 kg)  Height: 5' 11 (1.803 m)   Body mass index is 29.29 kg/m.     09/20/2023    1:59 PM 09/14/2022    2:01 PM 08/05/2022   11:25 AM 02/15/2021    4:03 PM 02/03/2020   12:44 PM 12/11/2019    3:22 PM 09/27/2019    2:16 PM  Advanced Directives  Does Patient Have a Medical Advance Directive? No No No No No No No  Would patient like information on creating a medical advance directive? No - Patient declined No - Patient declined  No - Patient declined No - Patient declined Yes (MAU/Ambulatory/Procedural Areas -  Information given) No - Patient declined    Current Medications (verified) Outpatient Encounter Medications as of 09/20/2023  Medication Sig   amLODipine  (NORVASC ) 5 MG tablet Take 1 tablet (5 mg total) by mouth daily.   blood glucose meter kit and supplies Dispense based on patient and insurance preference. Use twice daily as directed. (FOR ICD-10 E10.9, E11.9).   Blood Glucose Monitoring Suppl DEVI 1 each by Does not apply route in the morning, at noon, and at bedtime. May substitute to any manufacturer covered by patient's insurance.   carvedilol  (COREG ) 25 MG tablet Take 1 tablet (25 mg total) by mouth 2 (two) times daily with a meal.   Cholecalciferol (VITAMIN D -3) 125 MCG (5000 UT) TABS Take 1 tablet by mouth daily.   Dulaglutide  (TRULICITY ) 1.5 MG/0.5ML SOAJ Inject 1.5 mg into the skin once a week.   FARXIGA  10 MG TABS tablet Take 1 tablet (10 mg total) by mouth daily.   Glucose Blood (BLOOD GLUCOSE TEST STRIPS) STRP 1 each by In Vitro route in the morning, at noon, and at bedtime. May substitute to any manufacturer covered by patient's insurance.   glucose blood test strip Check blood glucose 2 times a day   Lancets (ONETOUCH ULTRASOFT) lancets Use as instructed bid   Lancets MISC 1 Device by Does not apply route in the morning, at noon, and at bedtime. Dispense for onetouch ultra 2 machine ONLY   montelukast  (SINGULAIR ) 10 MG tablet Take 1 tablet (10 mg total) by mouth daily.   Multiple Vitamins-Minerals (CENTRUM SILVER 50+WOMEN PO) Take 1 tablet by  mouth daily.   rosuvastatin  (CRESTOR ) 20 MG tablet Take 1 tablet (20 mg total) by mouth daily.   sacubitril -valsartan  (ENTRESTO ) 97-103 MG Take 1 tablet by mouth 2 (two) times daily. Overdue yearly follow up visit.  PLEASE CALL OFFICE TO SCHEDULE APPOINTMENT PRIOR TO NEXT REFILL (first attempt)   sodium bicarbonate  650 MG tablet Take 1 tablet (650 mg total) by mouth 2 (two) times daily.   sodium chloride  (OCEAN) 0.65 % SOLN nasal spray Place  1 spray into both nostrils as needed for congestion.    spironolactone  (ALDACTONE ) 25 MG tablet Take 1 tablet by mouth once daily   Baclofen  5 MG TABS Take 1 tablet (5 mg total) by mouth at bedtime as needed (for muscles spasms). (Patient not taking: Reported on 09/20/2023)   pregabalin  (LYRICA ) 25 MG capsule Take 1 capsule (25 mg total) by mouth 2 (two) times daily. (Patient not taking: Reported on 09/20/2023)   Facility-Administered Encounter Medications as of 09/20/2023  Medication   albuterol  (PROVENTIL ) (2.5 MG/3ML) 0.083% nebulizer solution 2.5 mg    Allergies (verified) Penicillins   History: Past Medical History:  Diagnosis Date   Anxiety    Asthma    CKD stage 3a, GFR 45-59 ml/min (HCC)    Depression    Diabetes mellitus without complication (HCC)    History of kidney stones    Hypertension    Kidney stones    Laryngospasm    Multinodular goiter    Muscle tension dysphonia    Sleep apnea    Vertigo    when laying flat   Vocal cord dysfunction    makes it hard for patient to talk and breathe   Past Surgical History:  Procedure Laterality Date   BREAST BIOPSY Left 11/15/2022   Left Breast stereo bx, coil clip path pending   BREAST BIOPSY Left 11/15/2022   MM LT BREAST BX W LOC DEV 1ST LESION IMAGE BX SPEC STEREO GUIDE 11/15/2022 [facility]   CARDIAC CATHETERIZATION     COLONOSCOPY WITH PROPOFOL  N/A 08/05/2022   Procedure: COLONOSCOPY WITH PROPOFOL ;  Surgeon: Maryruth Ole DASEN, MD;  Location: [facility];  Service: Endoscopy;  Laterality: N/A;   CYSTOSCOPY W/ RETROGRADES Right 09/27/2019   Procedure: CYSTOSCOPY WITH RETROGRADE PYELOGRAM;  Surgeon: Francisca Redell BROCKS, MD;  Location: [facility];  Service: Urology;  Laterality: Right;   CYSTOSCOPY/URETEROSCOPY/HOLMIUM LASER/STENT PLACEMENT Left 09/27/2019   Procedure: CYSTOSCOPY/URETEROSCOPY/HOLMIUM LASER/STENT PLACEMENT;  Surgeon: Francisca Redell BROCKS, MD;  Location: [facility];  Service: Urology;  Laterality: Left;    HEMOSTASIS CLIP PLACEMENT  08/05/2022   Procedure: HEMOSTASIS CLIP PLACEMENT;  Surgeon: Maryruth Ole DASEN, MD;  Location: [facility];  Service: Endoscopy;;   LITHOTRIPSY     POLYPECTOMY  08/05/2022   Procedure: POLYPECTOMY;  Surgeon: Maryruth Ole DASEN, MD;  Location: [facility];  Service: Endoscopy;;   RIGHT/LEFT HEART CATH AND CORONARY ANGIOGRAPHY Bilateral 04/08/2019   Procedure: RIGHT/LEFT HEART CATH AND CORONARY ANGIOGRAPHY;  Surgeon: Darron Deatrice LABOR, MD;  Location: [facility];  Service: Cardiovascular;  Laterality: Bilateral;   Family History  Problem Relation Age of Onset   Hypertension Mother    Diabetes Mother    Hypertension Father    Diabetes Father    Breast cancer Neg Hx    Social History   Socioeconomic History   Marital status: Divorced    Spouse name: Not on file   Number of children: 2   Years of education: Not on file   Highest education level: 9th grade  Occupational History   Not on file  Tobacco Use   Smoking status: Former    Current packs/day: 0.00    Average packs/day: 1 pack/day for 10.0 years (10.0 ttl pk-yrs)    Types: Cigarettes    Start date: 01/04/1988    Quit date: 01/03/1998    Years since quitting: 25.7   Smokeless tobacco: Never  Vaping Use   Vaping status: Never Used  Substance and Sexual Activity   Alcohol use: Not Currently   Drug use: Not Currently    Types: Marijuana    Comment: years ago   Sexual activity: Not Currently  Other Topics Concern   Not on file  Social History Narrative   Lives at home    2 daughters as of 04/04/19 22 y.o daughter lives with her (she works)   Patient is on disability.   Social Drivers of Corporate investment banker Strain: Low Risk  (09/20/2023)   Overall Financial Resource Strain (CARDIA)    Difficulty of Paying Living Expenses: Not hard at all  Food Insecurity: No Food Insecurity (09/20/2023)   Hunger Vital Sign    Worried About Running Out of Food in the Last Year: Never true     Ran Out of Food in the Last Year: Never true  Transportation Needs: No Transportation Needs (09/20/2023)   PRAPARE - Administrator, Civil Service (Medical): No    Lack of Transportation (Non-Medical): No  Physical Activity: Inactive (09/20/2023)   Exercise Vital Sign    Days of Exercise per Week: 0 days    Minutes of Exercise per Session: 0 min  Stress: Stress Concern Present (09/20/2023)   [employer] of Occupational Health - Occupational Stress Questionnaire    Feeling of Stress: To some extent  Social Connections: Moderately Isolated (09/20/2023)   Social Connection and Isolation Panel    Frequency of Communication with Friends and Family: Twice a week    Frequency of Social Gatherings with Friends and Family: More than three times a week    Attends Religious Services: Never    Database administrator or Organizations: Yes    Attends Engineer, structural: More than 4 times per year    Marital Status: Divorced    Tobacco Counseling Counseling given: Not Answered    Clinical Intake:  Pre-visit preparation completed: Yes  Pain : No/denies pain     BMI - recorded: 29.29 Nutritional Status: BMI 25 -29 Overweight Nutritional Risks: None Diabetes: Yes CBG done?: Yes (per patient FBS 137)  Lab Results  Component Value Date   HGBA1C 7.3 (A) 03/06/2023   HGBA1C 7.4 (H) 09/01/2022   HGBA1C 6.8 (H) 08/24/2021     How often do you need to have someone help you when you read instructions, pamphlets, or other written materials from your doctor or pharmacy?: 1 - Never  Interpreter Needed?: No  Information entered by :: R. Rachael Ferrie LPN   Activities of Daily Living     09/20/2023    1:43 PM  In your present state of health, do you have any difficulty performing the following activities:  Hearing? 0  Vision? 0  Difficulty concentrating or making decisions? 0  Walking or climbing stairs? 0  Dressing or bathing? 0  Doing errands, shopping? 0   Preparing Food and eating ? N  Using the Toilet? N  In the past six months, have you accidently leaked urine? Y  Do you have problems with loss of bowel control? N  Managing your Medications? N  Managing your Finances? N  Housekeeping or managing your Housekeeping? N    Patient Care Team: Darliss Rogue, MD as PCP - Cardiology (Cardiology) Tamea Dedra CROME, MD as Consulting Physician (Pulmonary Disease) Maree Jannett POUR, MD as Consulting Physician (Neurology)  I have updated your Care Teams any recent Medical Services you may have received from other providers in the past year.     Assessment:   This is a routine wellness examination for [facility].  Hearing/Vision screen Hearing Screening - Comments:: No issues Vision Screening - Comments:: readers   Goals Addressed             This Visit's Progress    Patient Stated       Wants to exercise, move around more        Depression Screen     09/20/2023    1:50 PM 03/06/2023    2:37 PM 12/06/2022    3:15 PM 11/03/2022   11:40 AM 09/14/2022    1:53 PM 09/01/2022   10:48 AM 03/28/2022    1:29 PM  PHQ 2/9 Scores  PHQ - 2 Score 4 4 5  2 4    PHQ- 9 Score 9 12 13  6 12       Information is confidential and restricted. Go to Review Flowsheets to unlock data.    Fall Risk     09/20/2023    1:46 PM 03/06/2023    2:37 PM 12/06/2022    2:44 PM 09/14/2022    1:50 PM 09/01/2022   10:48 AM  Fall Risk   Falls in the past year? 0 0 0 0 0  Number falls in past yr: 0 0 0 0 0  Injury with Fall? 0 0 0 0 0  Risk for fall due to : No Fall Risks No Fall Risks  No Fall Risks No Fall Risks  Follow up Falls evaluation completed;Falls prevention discussed Falls prevention discussed Falls evaluation completed Falls prevention discussed;Falls evaluation completed Falls evaluation completed    MEDICARE RISK AT HOME:  Medicare Risk at Home Any stairs in or around the home?: No If so, are there any without handrails?: No Home free of loose  throw rugs in walkways, pet beds, electrical cords, etc?: Yes Adequate lighting in your home to reduce risk of falls?: Yes Life alert?: No Use of a cane, walker or w/c?: No Grab bars in the bathroom?: No Shower chair or bench in shower?: No Elevated toilet seat or a handicapped toilet?: No  TIMED UP AND GO:  Was the test performed?  No  Cognitive Function: 6CIT completed        09/20/2023    1:59 PM 09/14/2022    2:01 PM 02/15/2021    4:06 PM 01/15/2019    1:28 PM  6CIT Screen  What Year? 0 points 0 points 0 points 0 points  What month? 0 points 0 points 0 points 0 points  What time? 0 points 0 points 0 points 0 points  Count back from 20 0 points 0 points 0 points 0 points  Months in reverse 2 points 2 points 0 points 0 points  Repeat phrase 0 points 0 points 0 points 0 points  Total Score 2 points 2 points 0 points 0 points    Immunizations Immunization History  Administered Date(s) Administered   Influenza, Seasonal, Injecte, Preservative Fre 12/06/2022   Influenza,inj,Quad PF,6+ Mos 10/10/2017, 11/07/2018, 11/12/2019, 01/08/2021   PFIZER(Purple Top)SARS-COV-2 Vaccination 05/23/2019, 06/13/2019, 12/10/2019   PNEUMOCOCCAL  CONJUGATE-20 08/24/2021   Pfizer Covid-19 Vaccine Bivalent Booster 29yrs & up 01/12/2021   Pneumococcal Polysaccharide-23 07/10/2019   Tdap 02/11/2021    Screening Tests Health Maintenance  Topic Date Due   Zoster Vaccines- Shingrix  (1 of 2) Never done   Diabetic kidney evaluation - Urine ACR  02/20/2020   Cervical Cancer Screening (HPV/Pap Cotest)  02/06/2023   Influenza Vaccine  08/04/2023   Diabetic kidney evaluation - eGFR measurement  09/01/2023   COVID-19 Vaccine (5 - 2025-26 season) 09/04/2023   Medicare Annual Wellness (AWV)  09/14/2023   HEMOGLOBIN A1C  09/06/2023   Mammogram  10/18/2023   FOOT EXAM  03/05/2024   OPHTHALMOLOGY EXAM  05/23/2024   Colonoscopy  08/04/2025   DTaP/Tdap/Td (2 - Td or Tdap) 02/12/2031   Pneumococcal  Vaccine: 50+ Years  Completed   Hepatitis C Screening  Completed   HIV Screening  Completed   Hepatitis B Vaccines 19-59 Average Risk  Aged Out   HPV VACCINES  Aged Out   Meningococcal B Vaccine  Aged Out    Health Maintenance Items Addressed: Mammogram ordered Discussed the need to update flu, covid and shingles vaccines. Patient needs a pap smear and A1C at upcoming visit.   Additional Screening:  Vision Screening: Recommended annual ophthalmology exams for early detection of glaucoma and other disorders of the eye. Is the patient up to date with their annual eye exam?  Yes  Who is the provider or what is the name of the office in which the patient attends annual eye exams? [facility]  Dental Screening: Recommended annual dental exams for proper oral hygiene  Community Resource Referral / Chronic Care Management: CRR required this visit?  No   CCM required this visit?  No   Plan:    I have personally reviewed and noted the following in the patient's chart:   Medical and social history Use of alcohol, tobacco or illicit drugs  Current medications and supplements including opioid prescriptions. Patient is not currently taking opioid prescriptions. Functional ability and status Nutritional status Physical activity Advanced directives List of other physicians Hospitalizations, surgeries, and ER visits in previous 12 months Vitals Screenings to include cognitive, depression, and falls Referrals and appointments  In addition, I have reviewed and discussed with patient certain preventive protocols, quality metrics, and best practice recommendations. A written personalized care plan for preventive services as well as general preventive health recommendations were provided to patient.   Angeline Fredericks, LPN   [id number]   After Visit Summary: (MyChart) Due to this being a telephonic visit, the after visit summary with patients personalized plan was offered to patient via MyChart    Notes: Nothing significant to report at this time.  Patient needs pap smear and A1C at next office visit.

## 2023-09-20 NOTE — Patient Instructions (Addendum)
 [name],  Thank you for taking the time for your Medicare Wellness Visit. I appreciate your continued commitment to your health goals. Please review the care plan we discussed, and feel free to reach out if I can assist you further.  Medicare recommends these wellness visits once per year to help you and your care team stay ahead of potential health issues. These visits are designed to focus on prevention, allowing your provider to concentrate on managing your acute and chronic conditions during your regular appointments.  Please note that Annual Wellness Visits do not include a physical exam. Some assessments may be limited, especially if the visit was conducted virtually. If needed, we may recommend a separate in-person follow-up with your provider.  Ongoing Care Seeing your primary care provider every 3 to 6 months helps us  monitor your health and provide consistent, personalized care.   Remember to update your flu, covid and shingles vaccines.  You have an order for:  []   2D Mammogram  [x]   3D Mammogram  []   Bone Density     Please call for appointment:  [facility] [place]  [address] [place] [address] [phone]     Make sure to wear two-piece clothing.  No lotions, powders, or deodorants the day of the appointment. Make sure to bring picture ID and insurance card.  Bring list of medications you are currently taking including any supplements.    Referrals If a referral was made during today's visit and you haven't received any updates within two weeks, please contact the referred provider directly to check on the status.  Recommended Screenings:  Health Maintenance  Topic Date Due   Zoster (Shingles) Vaccine (1 of 2) Never done   Yearly kidney health urinalysis for diabetes  02/20/2020   Pap with HPV screening  02/06/2023   Flu Shot  08/04/2023   Yearly kidney function blood test for diabetes  09/01/2023    COVID-19 Vaccine (5 - 2025-26 season) 09/04/2023   Hemoglobin A1C  09/06/2023   Breast Cancer Screening  10/18/2023   Complete foot exam   03/05/2024   Eye exam for diabetics  05/23/2024   Medicare Annual Wellness Visit  09/19/2024   Colon Cancer Screening  08/04/2025   DTaP/Tdap/Td vaccine (2 - Td or Tdap) 02/12/2031   Pneumococcal Vaccine for age over 19  Completed   Hepatitis C Screening  Completed   HIV Screening  Completed   Hepatitis B Vaccine  Aged Out   HPV Vaccine  Aged Out   Meningitis B Vaccine  Aged Out       09/20/2023    1:59 PM  Advanced Directives  Does Patient Have a Medical Advance Directive? No  Would patient like information on creating a medical advance directive? No - Patient declined   Advance Care Planning is important because it: Ensures you receive medical care that aligns with your values, goals, and preferences. Provides guidance to your family and loved ones, reducing the emotional burden of decision-making during critical moments.  Vision: Annual vision screenings are recommended for early detection of glaucoma, cataracts, and diabetic retinopathy. These exams can also reveal signs of chronic conditions such as diabetes and high blood pressure.  Dental: Annual dental screenings help detect early signs of oral cancer, gum disease, and other conditions linked to overall health, including heart disease and diabetes.  Please see the attached documents for additional preventive care recommendations.

## 2023-09-29 ENCOUNTER — Encounter: Admitting: Nurse Practitioner

## 2023-10-16 NOTE — Progress Notes (Signed)
 [facility] Follow Up Visit   Patient Name: Dealva Lafoy, female   Patient DOB: 04/07/1960 Date of Service: 10/16/2023  Patient MRN: [id number] Provider Creating Note: Woodward Brought, MD  [phone] Primary Care Physician: Abbey Bruckner, MD   [address] [place] [address] Additional Physicians/ Providers:   Impression/Recommendations   Ms. Nini Cavan is a 63 y.o. female with vocal cord dysphonia, hypertension, diabetes mellitus type II, diabetic retinopathy, hyperlipidemia, sleep apnea and allergies who is following up today for chronic kidney disease stage IIIA. Creatinine 1.2, GFR of 51.   Chronic kidney disease stage IIIA: with proteinuria and metabolic acidosis - Continue Entresto  - Continue dapagliflozin  - Continue spironolactone  - Continue sodium bicarbonate   Hypertension with chronic kidney disease: 132/75 - but with dizziness.   - Current regimen of carvedilol , amlodipine , Entresto , and spironolactone . - decrease dose of amlodipine  to 2.5mg  daily.  - home blood pressure monitoring  Vocal cord dyfunction: stable.   Secondary Hyperparathyroidism:  - Continue cholecalciferol.   Diabetes mellitus type II with chronic kidney disease: noninsulin dependent. Hemoglobin A1c of 7.3% on 03/06/23.  - Continue dapagliflozin .  - Continue dulaglutide .   Patient Active Problem List  Diagnosis  . Stage 3a chronic kidney disease (HCC)  . Proteinuria  . Secondary hyperparathyroidism of renal origin (HCC)  . Diabetes mellitus with renal manifestations, type II or unspecified type, not stated as uncontrolled (HCC)  . Anemia in chronic kidney disease  . Hypertensive chronic kidney disease, benign, with chronic kidney disease stage I through stage IV, or unspecified    Orders Placed This Encounter  . amLODIPine  (NORVASC ) 2.5 MG tablet       Return in about 6 months (around 04/15/2024).  Chief Complaint   Chief Complaint  Patient presents  with  . Follow-up    History of Present Illness   Ms. Terria Deschepper presents for follow up. Patient presents by herself. Patient states her vocal cord spasms are stable.  Patient states she is having dizziness. She feels that her blood pressure agents might be too strong. She states her home blood pressure cuff needs batteries.   Patient states she is taking all her other medications as prescribed. She reports no changes to her medications. Patient states her blood pressure has been at goal. Denies any peripheral swelling.   Patient states that her glucose levels are at goal.   Patient denies use of nonsteroidal anti-inflammatory agents.   Patient denies any recent hospitalizations.   Medications   Current Outpatient Medications:  .  amLODIPine  (NORVASC ) 2.5 MG tablet, Take 1 tablet (2.5 mg total) by mouth 1 (one) time each day, Disp: 30 tablet, Rfl: 11 .  Multiple Vitamin (multivitamin) tablet, Take 1 tablet by mouth 1 (one) time each day, Disp: , Rfl:  .  carvedilol  (COREG ) 25 MG tablet, Take 1 tablet by mouth 2 (two) times a day, Disp: , Rfl:  .  Cholecalciferol 125 MCG (5000 UT) tablet, Take 1 tablet by mouth 1 (one) time each day    , Disp: , Rfl:  .  Dapagliflozin  Propanediol (Farxiga ) 10 MG tablet, Take 10 mg by mouth 1 (one) time each day, Disp: , Rfl:  .  Dulaglutide  (Trulicity ) 1.5 MG/0.5ML solution pen-injector, 1 (one) time per week, Disp: , Rfl:  .  Entresto  97-103 MG per tablet, Take 1 tablet by mouth in the morning and 1 tablet in the evening., Disp: 180 tablet, Rfl: 3 .  LORazepam  (ATIVAN ) 0.5 MG tablet, Take 0.5  mg by mouth 1 (one) time each day if needed, Disp: , Rfl:  .  montelukast  (SINGULAIR ) 10 MG tablet, Take 1 tablet by mouth daily, Disp: , Rfl:  .  rosuvastatin  (CRESTOR ) 20 MG tablet, Take 20 mg by mouth, Disp: , Rfl:  .  sodium bicarbonate  650 MG tablet, Take 650 mg by mouth in the morning and 650 mg in the evening and 650 mg before bedtime., Disp: , Rfl:   .  sodium chloride  (OCEAN) 0.65 % nasal spray, Administer 1 spray into affected nostril(s), Disp: , Rfl:  .  spironolactone  (ALDACTONE ) 25 MG tablet, Take 25 mg by mouth 1 (one) time each day, Disp: , Rfl:    Allergies Penicillin v  History Past Medical History:  Diagnosis Date  . Anemia in chronic kidney disease 11/09/2018  . Background diabetic retinopathy (HCC)   . Chronic kidney disease, stage 3a 11/09/2018  . Diabetes mellitus with renal manifestations, type II or unspecified type, not stated as uncontrolled (HCC) 11/09/2018  . Disorder of vocal cord   . Hyperlipidemia   . Hypertensive chronic kidney disease, benign, with chronic kidney disease stage I through stage IV, or unspecified 01/07/2019  . Nephrolithiasis   . Proteinuria 11/09/2018  . Secondary hyperparathyroidism of renal origin (HCC) 11/09/2018  . Sleep apnea     History reviewed. No pertinent surgical history. Family History  Problem Relation Age of Onset  . Hypertension Sibling        1 brother  . Hypertension Mother   . Diabetes Father   . Cancer Father        throat   Social History   Tobacco Use  . Smoking status: Former    Current packs/day: 1.00    Types: Cigarettes  . Smokeless tobacco: Never  . Tobacco comments:    Smoking History Info:Every day  Substance Use Topics  . Alcohol use: Not Currently    Comment: Alcoholic Drinks/day: Occasional social drink     Physical Exam  Vitals BP 132/75 (BP Location: Left upper arm, Patient Position: Sitting)   Pulse 85   Temp 97.8 F   Wt 216 lb 9.6 oz (98.2 kg)   SpO2 98%   BMI 32.93 kg/m   Vitals reviewed. Constitutional: She is oriented to person, place, and time.  HEENT:  Right Ear: Hearing normal.  Left Ear: Hearing normal.  Nose: Nose normal. Mouth/Throat: Oropharynx is clear and moist.  Eyes: Conjunctivae are normal. Pupils are equal, round, and reactive to light.  Cardiovascular:  Normal rate, regular rhythm and intact distal pulses.            Pulmonary/Chest: Effort normal and breath sounds normal.  Abdominal: Soft. Bowel sounds are normal.  Neurological: She is alert and oriented to person, place, and time.  Vocal cord dysphonia  Skin: Skin is warm and dry.  Psychiatric: She has a normal mood and affect. Her behavior is normal. Judgment normal.     Laboratory Studies  Chemistry  Lab Units 03/01/23 0859 08/17/22 1155 03/22/22 0904 11/17/21 1546  SODIUM mmol/L 136 139 140 136  POTASSIUM mmol/L 5.3 5.0 4.7 4.7  CHLORIDE mmol/L 99 105 102 101  CO2 mmol/L 29 29 26 28   CALCIUM  mg/dL 9.4 9.7 9.4 9.5  PHOSPHORUS mg/dL 4.0 4.5 3.9 4.1  PTH pg/mL 66 55 61 47  GLUCOSE mg/dL 810* 808* 788* 855*  ALBUMIN g/dL 4.2 4.1 4.1 4.2  BUN mg/dL 28* 46* 38* 37*  CREATININE mg/dL 8.79* 8.66* 8.82* 8.92*  No lab exists for component: IRON SATURATION, TRANSSATPER  CBC  Lab Units 03/01/23 0859 03/01/23 0857 08/17/22 1155 03/22/22 0904 11/17/21 1546  WBC AUTO Thousand/uL 5.8  --  6.0 6.5 6.3  HEMOGLOBIN g/dL 86.9  --  87.0 86.6 86.9  HEMOGLOBIN URINE   --  NEGATIVE  --  NEGATIVE  --   HEMATOCRIT % 45.2*  --  42.4 44.3 41.9  MCV fL 77.5*  --  74.9* 76.8* 76.2*  PLATELETS AUTO Thousand/uL 342  --  349 302 302    Urine  Lab Units 03/01/23 0857 03/22/22 0904 11/17/21 1546  COLOR U  YELLOW YELLOW  --   KETONES U MG/DL  NEGATIVE NEGATIVE  --   PROT/CREAT RATIO UR mg/g creat 0.204*  204* 0.138  138  --   ALB MG/G CREAT UR mcg/mg creat  --   --  17        No lab exists for component: CYCLOSPORITR     Woodward Brought, MD  [employer], [place]
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
## 2023-11-01 ENCOUNTER — Ambulatory Visit (INDEPENDENT_AMBULATORY_CARE_PROVIDER_SITE_OTHER): Admitting: Urology

## 2023-11-01 VITALS — BP 144/100 | HR 91 | Ht 71.0 in | Wt 209.0 lb

## 2023-11-01 DIAGNOSIS — N393 Stress incontinence (female) (male): Secondary | ICD-10-CM | POA: Diagnosis not present

## 2023-11-01 DIAGNOSIS — R399 Unspecified symptoms and signs involving the genitourinary system: Secondary | ICD-10-CM

## 2023-11-01 DIAGNOSIS — N200 Calculus of kidney: Secondary | ICD-10-CM | POA: Diagnosis not present

## 2023-11-01 DIAGNOSIS — N3281 Overactive bladder: Secondary | ICD-10-CM

## 2023-11-01 NOTE — Progress Notes (Signed)
   11/01/2023 2:42 PM   Nancy BROEKER 04/13/1960 [id number]  Reason for visit: Follow up uric acid nephrolithiasis, microscopic hematuria, OAB, CKD  History: History of uric acid stones, large 2 cm left renal pelvis stone treated with ureteroscopy in 2021.  Did not tolerate potassium citrate and was transition to sodium bicarbonate . History of negative workup for microscopic hematuria Mild urinary symptoms with urgency, nocturia, not bothersome enough to consider medications.  Mild incontinence without sensation that does not require a pad CKD is followed by nephrology  Physical Exam: BP (!) 144/100 (BP Location: Left Arm, Patient Position: Sitting, Cuff Size: Normal)   Pulse 91   Ht 5' 11 (1.803 m)   Wt 209 lb (94.8 kg)   LMP 01/03/2013   SpO2 99%   BMI 29.15 kg/m   Imaging/labs: Recent urinalysis benign aside from low urine pH of 5 Renal ultrasound October 2024 with possible punctate stones bilaterally, no hydronephrosis  Today: Denies any stone events, dysuria, or gross hematuria since her last visit Baseline urinary symptoms unchanged, not bothersome enough to consider medications, mild stress incontinence, not requiring pad We reviewed urine pH remains very acidic despite sodium HCO3  Plan:   Nephrolithiasis/uric acid stones: Recommended changing to half teaspoon baking soda twice daily to help alkalinize urine and prevent recurrent uric acid stone, can stop sodium bicarbonate  Urinary symptoms: Mild, not bothersome enough at this time to consider medications or other treatment RTC 1 year renal ultrasound prior, symptom check, UA to check urine pH   Redell JAYSON Burnet, MD  [facility] [address], [address] [place], [place] [address] [phone]

## 2023-11-01 NOTE — Patient Instructions (Signed)
 Mix 1/4-1/2 teaspoon baking soda with 8 ounces of water in the morning and evening to help prevent kidney stones

## 2023-11-02 ENCOUNTER — Ambulatory Visit: Payer: Self-pay | Admitting: Urology

## 2023-11-03 ENCOUNTER — Telehealth (INDEPENDENT_AMBULATORY_CARE_PROVIDER_SITE_OTHER): Admitting: Psychiatry

## 2023-11-03 ENCOUNTER — Encounter: Payer: Self-pay | Admitting: Psychiatry

## 2023-11-03 DIAGNOSIS — F411 Generalized anxiety disorder: Secondary | ICD-10-CM | POA: Diagnosis not present

## 2023-11-03 DIAGNOSIS — F4310 Post-traumatic stress disorder, unspecified: Secondary | ICD-10-CM

## 2023-11-03 DIAGNOSIS — Z79899 Other long term (current) drug therapy: Secondary | ICD-10-CM

## 2023-11-03 DIAGNOSIS — F330 Major depressive disorder, recurrent, mild: Secondary | ICD-10-CM

## 2023-11-03 MED ORDER — VILAZODONE HCL 10 MG PO TABS: ORAL_TABLET | ORAL | 2 refills | Status: DC

## 2023-11-03 NOTE — Patient Instructions (Signed)
 Hold pregabalin   Hold lorazepam   Start Viibryd 5 mg daily for one week, then 10 mg daily  Next appointment:1/9 at 8 40

## 2023-11-13 ENCOUNTER — Other Ambulatory Visit: Payer: Self-pay

## 2023-11-13 DIAGNOSIS — I152 Hypertension secondary to endocrine disorders: Secondary | ICD-10-CM

## 2023-11-13 MED ORDER — AMLODIPINE BESYLATE 5 MG PO TABS: 5.0000 mg | ORAL_TABLET | Freq: Every day | ORAL | 0 refills | Status: DC

## 2023-11-16 ENCOUNTER — Other Ambulatory Visit: Payer: Self-pay | Admitting: Nurse Practitioner

## 2023-11-16 DIAGNOSIS — I700 Atherosclerosis of aorta: Secondary | ICD-10-CM

## 2023-11-16 NOTE — Telephone Encounter (Signed)
 Please inform pt medication has been refilled.

## 2023-11-16 NOTE — Telephone Encounter (Signed)
 Medication refill

## 2023-11-20 ENCOUNTER — Other Ambulatory Visit: Payer: Self-pay

## 2023-11-20 ENCOUNTER — Telehealth: Payer: Self-pay

## 2023-11-20 DIAGNOSIS — I10 Essential (primary) hypertension: Secondary | ICD-10-CM

## 2023-11-20 NOTE — Telephone Encounter (Unsigned)
 Copied from CRM #[id number]. Topic: Clinical - Medication Question >> Nov 20, 2023  3:29 PM Macario HERO wrote: Reason for CRM: Patient called said that her kidney doctor lowered her aamLODipine (NORVASC ) 5 MG tablet [[id number]] to 2.5 MG but her primary care provider Ms. Vincente sent a refill for 5 MG. So, patient needs clarity on which dosage she should take.

## 2023-11-20 NOTE — Telephone Encounter (Signed)
 Copied from CRM #[id number]. Topic: Clinical - Medication Question >> Nov 20, 2023  3:29 PM Nancy Barajas wrote: Reason for CRM: Patient called said that her kidney doctor lowered her aamLODipine (NORVASC ) 5 MG tablet [[id number]] to 2.5 MG but her primary care provider Ms. Vincente sent a refill for 5 MG. So, patient needs clarity on which dosage she should take.

## 2023-11-21 ENCOUNTER — Other Ambulatory Visit: Payer: Self-pay | Admitting: Nurse Practitioner

## 2023-11-21 MED ORDER — AMLODIPINE BESYLATE 2.5 MG PO TABS: 2.5000 mg | ORAL_TABLET | Freq: Every day | ORAL | Status: AC

## 2023-11-21 MED ORDER — CARVEDILOL 25 MG PO TABS: 25.0000 mg | ORAL_TABLET | Freq: Two times a day (BID) | ORAL | 3 refills | Status: AC

## 2023-11-21 NOTE — Progress Notes (Signed)
 Medication refilled

## 2023-11-21 NOTE — Telephone Encounter (Signed)
 Nephrology changed patient Amlodipine  to 2.5 mg on 10/16/2023 please clarify question sent by Daijah as whether patient should be ion 5 mg Amlodipine  of 2.5 mg as ordered by Nephrology.

## 2023-11-21 NOTE — Telephone Encounter (Signed)
 Reviewing pt record: Amlodipine  was decreased to 2.5 mg by Dr. Douglas on 10/16/23 and refill was sent to the pharmacy by him. Please advise pt to take 2.5 mg amlodipine  daily and dose update on pt chart.

## 2023-12-20 ENCOUNTER — Encounter
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
## 2024-01-12 ENCOUNTER — Encounter: Payer: Self-pay | Admitting: Psychiatry

## 2024-01-12 ENCOUNTER — Telehealth: Admitting: Psychiatry

## 2024-01-12 DIAGNOSIS — F411 Generalized anxiety disorder: Secondary | ICD-10-CM | POA: Diagnosis not present

## 2024-01-12 DIAGNOSIS — F4310 Post-traumatic stress disorder, unspecified: Secondary | ICD-10-CM | POA: Diagnosis not present

## 2024-01-12 DIAGNOSIS — F330 Major depressive disorder, recurrent, mild: Secondary | ICD-10-CM | POA: Diagnosis not present

## 2024-01-12 MED ORDER — FLUOXETINE HCL 10 MG PO CAPS: 10.0000 mg | ORAL_CAPSULE | Freq: Every day | ORAL | 1 refills | Status: AC

## 2024-01-12 NOTE — Patient Instructions (Signed)
 Start fluoxetine  10 mg daily  Next appointment:2/18 at 8 40

## 2024-01-24 ENCOUNTER — Telehealth: Payer: Self-pay

## 2024-01-24 NOTE — Telephone Encounter (Signed)
 Copied from CRM [id number]. Topic: Clinical - Medication Question >> Jan 24, 2024 10:18 AM Sophia H wrote: Reason for CRM: Patient states her dose of amLODipine  was taken from 5MG  down to 2.5MG  - she has noticed her BP is higher than normal. Wanting to know if she should start taking 5MG  again, please advise. # [phone]  Patient has an appt scheduled for [place] on Feb 3rd.

## 2024-01-24 NOTE — Telephone Encounter (Signed)
 Copied from CRM #[id number]. Topic: Clinical - Medication Refill >> Jan 24, 2024 10:15 AM Sophia H wrote: Medication: FARXIGA  10 MG TABS tablet montelukast  (SINGULAIR ) 10 MG tablet  Has the patient contacted their pharmacy? Yes, pharmacy requested with no response.   This is the patient's preferred pharmacy:  [facility] [address]), [place] - [address] [place]) [place] [address] Phone: [phone] Fax: [phone]  Is this the correct pharmacy for this prescription? Yes If no, delete pharmacy and type the correct one.   Has the prescription been filled recently? Yes  Is the patient out of the medication? Yes, completely out.  Has the patient been seen for an appointment in the last year OR does the patient have an upcoming appointment? Yes, appt on Feb 3rd   Can we respond through MyChart? Yes  Agent: Please be advised that Rx refills may take up to 3 business days. We ask that you follow-up with your pharmacy.

## 2024-01-25 NOTE — Telephone Encounter (Signed)
 Called Patient and she states her BP has been between 125/90 & 150/90, Patient denies any chest pain , SOB headache or any other symptoms.

## 2024-01-25 NOTE — Telephone Encounter (Signed)
 She can increase the dose to 5 mg daily. Please advise pt to monitor her blood pressure daily at the same time make a log and schedule a follow up in 2 weeks.

## 2024-01-25 NOTE — Telephone Encounter (Signed)
 Called Patient with Nancy Barajas's recommendations and the Patient is agreeable.

## 2024-01-25 NOTE — Telephone Encounter (Signed)
 Please call pt to check what are her blood pressure readings and any symptoms associated with elevated BP like chest pain, SOB, HA

## 2024-01-26 ENCOUNTER — Other Ambulatory Visit: Payer: Self-pay | Admitting: Nurse Practitioner

## 2024-01-26 MED ORDER — MONTELUKAST SODIUM 10 MG PO TABS: 10.0000 mg | ORAL_TABLET | Freq: Every day | ORAL | 1 refills | Status: DC

## 2024-01-26 MED ORDER — FARXIGA 10 MG PO TABS: 10.0000 mg | ORAL_TABLET | Freq: Every day | ORAL | 1 refills | Status: DC

## 2024-01-26 NOTE — Telephone Encounter (Signed)
 Patient is aware that medication has been called in and to keep her 02/06/24 appointment.

## 2024-01-26 NOTE — Progress Notes (Signed)
 Medication refilled.

## 2024-01-26 NOTE — Telephone Encounter (Signed)
 Please inform pt medication has been refilled for 30 days. She is overdue for follow up she should keep her OV for February.

## 2024-02-06 ENCOUNTER — Encounter: Payer: Self-pay | Admitting: Nurse Practitioner

## 2024-02-06 ENCOUNTER — Ambulatory Visit: Admitting: Nurse Practitioner

## 2024-02-06 ENCOUNTER — Telehealth: Payer: Self-pay

## 2024-02-06 VITALS — BP 128/78 | HR 97 | Temp 98.1°F | Ht 71.0 in | Wt 214.0 lb

## 2024-02-06 DIAGNOSIS — N183 Chronic kidney disease, stage 3 unspecified: Secondary | ICD-10-CM

## 2024-02-06 DIAGNOSIS — E782 Mixed hyperlipidemia: Secondary | ICD-10-CM

## 2024-02-06 DIAGNOSIS — F32A Depression, unspecified: Secondary | ICD-10-CM

## 2024-02-06 DIAGNOSIS — E118 Type 2 diabetes mellitus with unspecified complications: Secondary | ICD-10-CM

## 2024-02-06 DIAGNOSIS — I5020 Unspecified systolic (congestive) heart failure: Secondary | ICD-10-CM

## 2024-02-06 DIAGNOSIS — E1159 Type 2 diabetes mellitus with other circulatory complications: Secondary | ICD-10-CM

## 2024-02-06 DIAGNOSIS — F331 Major depressive disorder, recurrent, moderate: Secondary | ICD-10-CM

## 2024-02-06 LAB — POCT GLYCOSYLATED HEMOGLOBIN (HGB A1C): Hemoglobin A1C: 7.8 % — AB (ref 4.0–5.6)

## 2024-02-06 MED ORDER — FARXIGA 10 MG PO TABS: 10.0000 mg | ORAL_TABLET | Freq: Every day | ORAL | 1 refills | Status: AC

## 2024-02-06 MED ORDER — MONTELUKAST SODIUM 10 MG PO TABS: 10.0000 mg | ORAL_TABLET | Freq: Every day | ORAL | 1 refills | Status: AC

## 2024-02-06 NOTE — Assessment & Plan Note (Signed)
 [name]

## 2024-02-06 NOTE — Assessment & Plan Note (Signed)
 Orders:    Lipid panel; Future

## 2024-02-06 NOTE — Assessment & Plan Note (Signed)
 Nancy Barajas

## 2024-02-06 NOTE — Assessment & Plan Note (Signed)
" °  Orders:   Urine microalbumin-creatinine with uACR  "

## 2024-02-07 LAB — MICROALBUMIN / CREATININE URINE RATIO
Creatinine,U: 54.2 mg/dL
Microalb Creat Ratio: 90.6 mg/g — ABNORMAL HIGH (ref 0.0–30.0)
Microalb, Ur: 4.9 mg/dL — ABNORMAL HIGH (ref 0.7–1.9)

## 2024-02-21 ENCOUNTER — Telehealth: Admitting: Psychiatry

## 2024-06-06 ENCOUNTER — Ambulatory Visit: Admitting: Nurse Practitioner

## 2024-09-24 ENCOUNTER — Ambulatory Visit

## 2024-10-31 ENCOUNTER — Ambulatory Visit: Admitting: Physician Assistant
